# Patient Record
Sex: Female | Born: 1945
Health system: Southern US, Community
[De-identification: ages and names within clinical notes are randomized; demographics above are authoritative.]

## PROBLEM LIST (undated history)

## (undated) DIAGNOSIS — E11610 Type 2 diabetes mellitus with diabetic neuropathic arthropathy: Secondary | ICD-10-CM

## (undated) DIAGNOSIS — E049 Nontoxic goiter, unspecified: Secondary | ICD-10-CM

## (undated) DIAGNOSIS — F32A Depression, unspecified: Secondary | ICD-10-CM

## (undated) DIAGNOSIS — E119 Type 2 diabetes mellitus without complications: Secondary | ICD-10-CM

## (undated) DIAGNOSIS — M109 Gout, unspecified: Secondary | ICD-10-CM

## (undated) DIAGNOSIS — G629 Polyneuropathy, unspecified: Secondary | ICD-10-CM

## (undated) DIAGNOSIS — G43909 Migraine, unspecified, not intractable, without status migrainosus: Secondary | ICD-10-CM

## (undated) DIAGNOSIS — E785 Hyperlipidemia, unspecified: Secondary | ICD-10-CM

## (undated) DIAGNOSIS — M1990 Unspecified osteoarthritis, unspecified site: Secondary | ICD-10-CM

## (undated) DIAGNOSIS — R200 Anesthesia of skin: Secondary | ICD-10-CM

## (undated) DIAGNOSIS — I10 Essential (primary) hypertension: Secondary | ICD-10-CM

## (undated) DIAGNOSIS — F419 Anxiety disorder, unspecified: Secondary | ICD-10-CM

## (undated) DIAGNOSIS — K219 Gastro-esophageal reflux disease without esophagitis: Secondary | ICD-10-CM

## (undated) HISTORY — DX: Hyperlipidemia, unspecified: E78.5

## (undated) HISTORY — DX: Polyneuropathy, unspecified: G62.9

## (undated) HISTORY — DX: Anesthesia of skin: R20.0

---
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
## 1984-07-07 DIAGNOSIS — E049 Nontoxic goiter, unspecified: Secondary | ICD-10-CM

## 1984-07-07 HISTORY — PX: PARTIAL THYMECTOMY: SHX2177

## 1984-07-07 HISTORY — DX: Nontoxic goiter, unspecified: E04.9

## 1997-07-07 HISTORY — PX: ABDOMINAL HYSTERECTOMY: SHX81

## 2003-06-08 ENCOUNTER — Ambulatory Visit (HOSPITAL_COMMUNITY): Admission: RE | Admit: 2003-06-08 | Discharge: 2003-06-08 | Payer: Self-pay | Admitting: Internal Medicine

## 2003-09-08 ENCOUNTER — Ambulatory Visit (HOSPITAL_COMMUNITY): Admission: RE | Admit: 2003-09-08 | Discharge: 2003-09-08 | Payer: Self-pay | Admitting: Gastroenterology

## 2004-07-25 ENCOUNTER — Ambulatory Visit (HOSPITAL_COMMUNITY): Admission: RE | Admit: 2004-07-25 | Discharge: 2004-07-25 | Payer: Self-pay | Admitting: Cardiology

## 2004-08-07 ENCOUNTER — Ambulatory Visit (HOSPITAL_COMMUNITY): Admission: RE | Admit: 2004-08-07 | Discharge: 2004-08-07 | Payer: Self-pay | Admitting: Cardiology

## 2004-08-07 HISTORY — PX: CARDIAC CATHETERIZATION: SHX172

## 2007-09-09 ENCOUNTER — Encounter: Admission: RE | Admit: 2007-09-09 | Discharge: 2007-09-09 | Payer: Self-pay | Admitting: Obstetrics and Gynecology

## 2007-09-21 ENCOUNTER — Encounter: Admission: RE | Admit: 2007-09-21 | Discharge: 2007-09-21 | Payer: Self-pay | Admitting: Obstetrics and Gynecology

## 2007-12-08 ENCOUNTER — Encounter: Admission: RE | Admit: 2007-12-08 | Discharge: 2007-12-08 | Payer: Self-pay | Admitting: Internal Medicine

## 2010-04-10 ENCOUNTER — Encounter: Admission: RE | Admit: 2010-04-10 | Discharge: 2010-04-10 | Payer: Self-pay | Admitting: Orthopedic Surgery

## 2010-07-27 ENCOUNTER — Encounter: Payer: Self-pay | Admitting: Cardiology

## 2010-07-28 ENCOUNTER — Encounter: Payer: Self-pay | Admitting: Internal Medicine

## 2010-11-22 NOTE — Cardiovascular Report (Signed)
[name], Pamela Snyder                  ACCOUNT NO.:  [id number]   MEDICAL RECORD NO.:  [id number]          PATIENT TYPE:  OIB   LOCATION:  2876                         FACILITY:  [facility]   PHYSICIAN:  Antionette Char, MD    DATE OF BIRTH:  27-Apr-1946   DATE OF PROCEDURE:  08/07/2004  DATE OF DISCHARGE:                              CARDIAC CATHETERIZATION   REFERRING PHYSICIAN:  Juline Patch, M.D.   PROCEDURES:  1.  Left heart catheterization.  2.  Coronary scintiangiography.  3.  Left ventricular scintiangiography.  4.  Abdominal aortogram.  5.  Angioseal of the right femoral artery.   INDICATION FOR PROCEDURES:  This 65 year old female has a long history of  hypertension which has been difficult to control.  She recently had the  onset of anterior chest pain and dyspnea on exertion and had a stress test  done at [facility] with a Persantine-Cardiolite which was positive  for anterior and mid to proximal anteroseptal ischemia.  There was mild  septal hypokinesis.  She was then scheduled for cardiac catheterization  because of the evidence for the presence of myocardial ischemia.   PROCEDURE:  After signing informed consent, the patient was premedicated  with 5 mg of Valium by mouth and brought to the cardiac catheterization at  [facility].  Her right groin was prepped and draped in a sterile  fashion and anesthetized locally with 1% lidocaine.  A 6 French introducer  sheath was inserted percutaneously into the right femoral artery.  6 [place]  #4 Judkins coronary catheters were used to make injections into the native  coronary arteries.  A 6 French pigtail catheter was used to measure  pressures in the left ventricle and aorta and to make midstream injections  into the left ventricle and abdominal aorta.  The patient tolerated the  procedure well and no complications were noted.  At the end of the  procedure, the catheter and sheath were removed from the right  femoral  artery and hemostasis was easily obtained with an Angioseal closure system.   MEDICATIONS GIVEN:  None.   SCINTIANGIOGRAPHY FINDINGS:  1.  Coronary scintiangiography, left coronary artery.  The ostium and left      main appear normal.  Left anterior descending appears normal.      Circumflex coronary artery appears normal.  Right coronary artery      appears normal.  2.  Left ventricular scintiangiogram:  The left ventricular chamber size and      contractility appear normal with a normal left ventricular ejection      fraction estimated at approximately 60%.  The mitral and aortic valves      appear normal.  The left ventricular wall thickness appears normal.  3.  Abdominal aortogram:  The abdominal aorta and renal arteries appear      normal.   FINAL DIAGNOSES:  1.  Normal coronary arteries.  2.  Normal left ventricular function.  3.  Normal abdominal aorta and renal arteries.  4.  Successful Angioseal of the right femoral artery.   DISPOSITION:  Will monitor in the short stay unit prior to discharge when  stable.  She is to have followup with Dr. Ricki Miller for continued medical care.      JRT/MEDQ  D:  08/07/2004  T:  08/07/2004  Job:  [id number]   cc:   Juline Patch, M.D.  [address]  [place], [place] [address]  Fax: [phone]

## 2010-11-22 NOTE — Op Note (Signed)
NAME:  Pamela Snyder, Pamela Snyder                            ACCOUNT NO.:  [id number]   MEDICAL RECORD NO.:  [id number]                   PATIENT TYPE:  AMB   LOCATION:  ENDO                                 FACILITY:  [facility]   PHYSICIAN:  Anselmo Rod, M.D.               DATE OF BIRTH:  08-03-1945   DATE OF PROCEDURE:  09/08/2003  DATE OF DISCHARGE:                                 OPERATIVE REPORT   PROCEDURE:  Screening colonoscopy, endoscopy.   ENDOSCOPIST:  Anselmo Rod, M.D.   INSTRUMENT:  Olympus video colonoscope.   INDICATIONS FOR PROCEDURE:  A 65 year old white female with a family history  of colon cancer  undergoing screening colonoscopy to rule out colonic  polyps, masses, hemorrhoids, etc.   PRE-PROCEDURE PREPARATION:  Informed consent was procured from the patient.  Patient fasted for 8 hours prior to the procedure and prepped with a bottle  of magnesium citrate and a gallon of GoLYTELY the night prior to the  procedure.  Pre-procedure physical:  Patient had stable vital signs, neck  supple, chest clear to auscultation, S1 regular, respirations regular,  abdomen soft with normal bowel sounds.   DESCRIPTION OF PROCEDURE:  The patient was placed in the left lateral  decubitus position, sedated with 100 mg of Demerol and 10 mg of Versed  intravenously.  Once the patient was adequately sedated and maintained on  low flow oxygen, continuous cardiac monitoring; the Olympus video  colonoscope was advanced from the rectum to the cecum with difficulty.  There was some residual stool in the colon.  Multiple washings were done.  The appendiceal orifice and the ileocecal valve were clearly visualized and  photographed.  The patient had a lipomatous IC valve but TI appeared normal.  No masses, polyps, erosions, diverticular or ulcerations were seen.  Small  internal hemorrhoids were seen on retroflexion in the rectum.  The patient  tolerated the procedure well without complications.   IMPRESSION:  1. Normal colonoscopy to the terminal ileum except for small internal     hemorrhoids.  No masses or polyps seen.  2. Lipomatous ileocecal valve.  3. Some residual stool in the colon.   RECOMMENDATIONS:  1. Continue a high fiber diet with liberal fluids intake.  2. Repeat colorectal cancer screening is recommended in the next 5 years     unless the patient develops abnormal symptoms in the interim.  3. Outpatient follow up as the need arises in the future.                                               Anselmo Rod, M.D.    JNM/MEDQ  D:  09/08/2003  T:  09/09/2003  Job:  [id number]   cc:   Juline Patch, M.D.  [address]  [place], [place] [address]  Fax: [phone]

## 2014-07-11 DIAGNOSIS — Z23 Encounter for immunization: Secondary | ICD-10-CM | POA: Diagnosis not present

## 2014-07-11 DIAGNOSIS — I10 Essential (primary) hypertension: Secondary | ICD-10-CM | POA: Diagnosis not present

## 2014-07-11 DIAGNOSIS — K219 Gastro-esophageal reflux disease without esophagitis: Secondary | ICD-10-CM | POA: Diagnosis not present

## 2014-07-11 DIAGNOSIS — M1A0790 Idiopathic chronic gout, unspecified ankle and foot, without tophus (tophi): Secondary | ICD-10-CM | POA: Diagnosis not present

## 2014-07-11 DIAGNOSIS — E1165 Type 2 diabetes mellitus with hyperglycemia: Secondary | ICD-10-CM | POA: Diagnosis not present

## 2014-08-15 DIAGNOSIS — M5136 Other intervertebral disc degeneration, lumbar region: Secondary | ICD-10-CM | POA: Diagnosis not present

## 2014-08-15 DIAGNOSIS — M2241 Chondromalacia patellae, right knee: Secondary | ICD-10-CM | POA: Diagnosis not present

## 2014-08-15 DIAGNOSIS — M14671 Charcot's joint, right ankle and foot: Secondary | ICD-10-CM | POA: Diagnosis not present

## 2014-08-15 DIAGNOSIS — M7062 Trochanteric bursitis, left hip: Secondary | ICD-10-CM | POA: Diagnosis not present

## 2014-08-16 DIAGNOSIS — M14671 Charcot's joint, right ankle and foot: Secondary | ICD-10-CM | POA: Diagnosis not present

## 2014-08-16 DIAGNOSIS — E11610 Type 2 diabetes mellitus with diabetic neuropathic arthropathy: Secondary | ICD-10-CM | POA: Diagnosis not present

## 2014-10-23 DIAGNOSIS — M14671 Charcot's joint, right ankle and foot: Secondary | ICD-10-CM | POA: Diagnosis not present

## 2014-10-23 DIAGNOSIS — M25552 Pain in left hip: Secondary | ICD-10-CM | POA: Diagnosis not present

## 2014-10-23 DIAGNOSIS — M6701 Short Achilles tendon (acquired), right ankle: Secondary | ICD-10-CM | POA: Diagnosis not present

## 2014-10-23 DIAGNOSIS — E1142 Type 2 diabetes mellitus with diabetic polyneuropathy: Secondary | ICD-10-CM | POA: Diagnosis not present

## 2014-10-26 DIAGNOSIS — E11610 Type 2 diabetes mellitus with diabetic neuropathic arthropathy: Secondary | ICD-10-CM | POA: Diagnosis not present

## 2014-10-26 DIAGNOSIS — M14671 Charcot's joint, right ankle and foot: Secondary | ICD-10-CM | POA: Diagnosis not present

## 2014-10-27 DIAGNOSIS — E780 Pure hypercholesterolemia: Secondary | ICD-10-CM | POA: Diagnosis not present

## 2014-10-27 DIAGNOSIS — E1165 Type 2 diabetes mellitus with hyperglycemia: Secondary | ICD-10-CM | POA: Diagnosis not present

## 2014-11-02 DIAGNOSIS — Z23 Encounter for immunization: Secondary | ICD-10-CM | POA: Diagnosis not present

## 2014-11-02 DIAGNOSIS — Z0000 Encounter for general adult medical examination without abnormal findings: Secondary | ICD-10-CM | POA: Diagnosis not present

## 2014-11-02 DIAGNOSIS — M1A0790 Idiopathic chronic gout, unspecified ankle and foot, without tophus (tophi): Secondary | ICD-10-CM | POA: Diagnosis not present

## 2014-11-02 DIAGNOSIS — Z1389 Encounter for screening for other disorder: Secondary | ICD-10-CM | POA: Diagnosis not present

## 2014-11-08 DIAGNOSIS — M14671 Charcot's joint, right ankle and foot: Secondary | ICD-10-CM | POA: Diagnosis not present

## 2014-11-14 ENCOUNTER — Other Ambulatory Visit: Payer: Self-pay | Admitting: Orthopedic Surgery

## 2014-11-15 ENCOUNTER — Encounter (HOSPITAL_COMMUNITY): Payer: Self-pay

## 2014-11-15 ENCOUNTER — Encounter (HOSPITAL_COMMUNITY)
Admission: RE | Admit: 2014-11-15 | Discharge: 2014-11-15 | Disposition: A | Discharge status: Home / Self Care | Payer: Medicare Other | Source: Ambulatory Visit | Attending: Orthopedic Surgery | Admitting: Orthopedic Surgery

## 2014-11-15 DIAGNOSIS — E119 Type 2 diabetes mellitus without complications: Secondary | ICD-10-CM | POA: Insufficient documentation

## 2014-11-15 DIAGNOSIS — Z01810 Encounter for preprocedural cardiovascular examination: Secondary | ICD-10-CM | POA: Diagnosis not present

## 2014-11-15 DIAGNOSIS — I10 Essential (primary) hypertension: Secondary | ICD-10-CM | POA: Diagnosis not present

## 2014-11-15 DIAGNOSIS — Z01812 Encounter for preprocedural laboratory examination: Secondary | ICD-10-CM | POA: Diagnosis not present

## 2014-11-15 HISTORY — DX: Essential (primary) hypertension: I10

## 2014-11-15 HISTORY — DX: Gastro-esophageal reflux disease without esophagitis: K21.9

## 2014-11-15 HISTORY — DX: Unspecified osteoarthritis, unspecified site: M19.90

## 2014-11-15 LAB — BASIC METABOLIC PANEL
Anion gap: 11 (ref 5–15)
BUN: 23 mg/dL — ABNORMAL HIGH (ref 6–20)
CO2: 24 mmol/L (ref 22–32)
Calcium: 9.4 mg/dL (ref 8.9–10.3)
Chloride: 99 mmol/L — ABNORMAL LOW (ref 101–111)
Creatinine, Ser: 1.66 mg/dL — ABNORMAL HIGH (ref 0.44–1.00)
GFR calc Af Amer: 36 mL/min — ABNORMAL LOW (ref >60)
GFR calc non Af Amer: 31 mL/min — ABNORMAL LOW (ref >60)
Glucose, Bld: 118 mg/dL — ABNORMAL HIGH (ref 70–99)
Potassium: 3.9 mmol/L (ref 3.5–5.1)
Sodium: 134 mmol/L — ABNORMAL LOW (ref 135–145)

## 2014-11-15 LAB — CBC
HCT: 37.1 % (ref 36.0–46.0)
Hemoglobin: 12 g/dL (ref 12.0–15.0)
MCH: 29.4 pg (ref 26.0–34.0)
MCHC: 32.3 g/dL (ref 30.0–36.0)
MCV: 90.9 fL (ref 78.0–100.0)
Platelets: 345 10*3/uL (ref 150–400)
RBC: 4.08 MIL/uL (ref 3.87–5.11)
RDW: 13.6 % (ref 11.5–15.5)
WBC: 16 10*3/uL — ABNORMAL HIGH (ref 4.0–10.5)

## 2014-11-15 NOTE — Pre-Procedure Instructions (Addendum)
Pamela Snyder  11/15/2014   Your procedure is scheduled on:  11/23/14  Report to [facility] short stay admitting at 530 AM.  Call this number if you have problems the morning of surgery: [phone]   Remember:   Do not eat food or drink liquids after midnight.   Take these medicines the morning of surgery with A SIP OF WATER: allopurinol. Metropolol. Protonix. Lyrica, pain med as needed    STOP all herbel meds, nsaids (aleve,naproxen,advil,ibuprofen) 5 days prior to surgery starting 11/18/14 including vit B, multi vit, biotin, cal-magnesium, folic acid, ginkgo,vit D     NO diabetic med am of surgery   Do not wear jewelry, make-up or nail polish.  Do not wear lotions, powders, or perfumes. You may wear deodorant.  Do not shave 48 hours prior to surgery. Men may shave face and neck.  Do not bring valuables to the hospital.  [facility] is not responsible                  for any belongings or valuables.               Contacts, dentures or bridgework may not be worn into surgery.  Leave suitcase in the car. After surgery it may be brought to your room.  For patients admitted to the hospital, discharge time is determined by your                treatment team.               Patients discharged the day of surgery will not be allowed to drive  home.  Name and phone number of your driver:   Special Instructions:  Special Instructions: [name] - Preparing for Surgery  Before surgery, you can play an important role.  Because skin is not sterile, your skin needs to be as free of germs as possible.  You can reduce the number of germs on you skin by washing with CHG (chlorahexidine gluconate) soap before surgery.  CHG is an antiseptic cleaner which kills germs and bonds with the skin to continue killing germs even after washing.  Please DO NOT use if you have an allergy to CHG or antibacterial soaps.  If your skin becomes reddened/irritated stop using the CHG and inform your nurse when you  arrive at Short Stay.  Do not shave (including legs and underarms) for at least 48 hours prior to the first CHG shower.  You may shave your face.  Please follow these instructions carefully:   1.  Shower with CHG Soap the night before surgery and the morning of Surgery.  2.  If you choose to wash your hair, wash your hair first as usual with your normal shampoo.  3.  After you shampoo, rinse your hair and body thoroughly to remove the Shampoo.  4.  Use CHG as you would any other liquid soap.  You can apply chg directly  to the skin and wash gently with scrungie or a clean washcloth.  5.  Apply the CHG Soap to your body ONLY FROM THE NECK DOWN.  Do not use on open wounds or open sores.  Avoid contact with your eyes ears, mouth and genitals (private parts).  Wash genitals (private parts)       with your normal soap.  6.  Wash thoroughly, paying special attention to the area where your surgery will be performed.  7.  Thoroughly rinse your body with warm water from  the neck down.  8.  DO NOT shower/wash with your normal soap after using and rinsing off the CHG Soap.  9.  Pat yourself dry with a clean towel.            10.  Wear clean pajamas.            11.  Place clean sheets on your bed the night of your first shower and do not sleep with pets.  Day of Surgery  Do not apply any lotions/deodorants the morning of surgery.  Please wear clean clothes to the hospital/surgery center.   Please read over the following fact sheets that you were given: Pain Booklet, Coughing and Deep Breathing and Surgical Site Infection Prevention

## 2014-11-15 NOTE — Progress Notes (Signed)
   11/15/14 1118  OBSTRUCTIVE SLEEP APNEA  Have you ever been diagnosed with sleep apnea through a sleep study? No  Do you snore loudly (loud enough to be heard through closed doors)?  1  Do you often feel tired, fatigued, or sleepy during the daytime? 0  Has anyone observed you stop breathing during your sleep? 0  Do you have, or are you being treated for high blood pressure? 1  BMI more than 35 kg/m2? 1  Age over 68 years old? 1  Neck circumference greater than 40 cm/16 inches? 0 (15.5)  Gender: 0  Obstructive Sleep Apnea Score 4

## 2014-11-16 NOTE — Progress Notes (Signed)
Anesthesia Chart Review:  Pt is 69 year old female scheduled for R mid foot osteotomy on 11/23/2014 with Dr. Doran Durand.   PMH includes: HTN, DM, GERD. Former smoker. BMI 38.5.   Medications include: lasix, metformin, metoprolol, olmesartan-hctz, lyrica, protonix  Preoperative labs reviewed.  Cr 1.66. WBC 16.   EKG: NSR.   Pt reports hx cardiac cath in 2006 that was normal.   Called and spoke with pt about lab results. She reports renal function has been elevated lately and her PCP, Dr. Minna Antis, is monitoring it. Will request records from his office. She also reports her WBC was elevated at her most recent visit with Dr. Minna Antis. She denies illness, no congestion, UTI sx, nausea, vomiting or diarrhea, no skin changes/rash. She reports she does have a mild cough but reports this is typical for her at this time of year due to allergies and she does not feel ill.  She does report she has complicated dental work and sometimes gets tooth/gum infections; the last was 2 weeks ago but she denies any current sx of dental infection.   Received lab results from Dr. Wilmon Pali office. Last labs drawn 11/02/2014 with comparison labs available from 07/05/2014 and 11/21/2013. Pt's cr has been 1.1-1.3 over past year. WBC has been 12.1-12.7 over past year.  Notified Abigail Butts in Dr. Nona Dell office of elevated WBC count and faxed lab results for his review.   Discussed with Dr. Kalman Shan.   If no changes, I anticipate pt can proceed with surgery as scheduled.   Willeen Cass, FNP-BC [facility] Short Stay Surgical Center/Anesthesiology Phone: [phone] 11/16/2014 4:29 PM

## 2014-11-22 MED ORDER — CEFAZOLIN SODIUM-DEXTROSE 2-3 GM-% IV SOLR
2.0000 g | INTRAVENOUS | Status: AC
  Administered 2014-11-23: 2 g via INTRAVENOUS

## 2014-11-23 ENCOUNTER — Inpatient Hospital Stay (HOSPITAL_COMMUNITY): Payer: Medicare Other | Admitting: Emergency Medicine

## 2014-11-23 ENCOUNTER — Encounter (HOSPITAL_COMMUNITY): Payer: Self-pay

## 2014-11-23 ENCOUNTER — Encounter (HOSPITAL_COMMUNITY)
Admission: RE | Disposition: A | Discharge status: Skilled Nursing Facility | Payer: Self-pay | Source: Ambulatory Visit | Attending: Orthopedic Surgery

## 2014-11-23 ENCOUNTER — Inpatient Hospital Stay (HOSPITAL_COMMUNITY): Payer: Medicare Other | Admitting: Anesthesiology

## 2014-11-23 ENCOUNTER — Inpatient Hospital Stay (HOSPITAL_COMMUNITY)
Admission: RE | Admit: 2014-11-23 | Discharge: 2014-11-27 | DRG: 983 | Disposition: A | Discharge status: Skilled Nursing Facility | Payer: Medicare Other | Source: Ambulatory Visit | Attending: Orthopedic Surgery | Admitting: Orthopedic Surgery

## 2014-11-23 DIAGNOSIS — E1142 Type 2 diabetes mellitus with diabetic polyneuropathy: Secondary | ICD-10-CM | POA: Diagnosis not present

## 2014-11-23 DIAGNOSIS — M6281 Muscle weakness (generalized): Secondary | ICD-10-CM | POA: Diagnosis not present

## 2014-11-23 DIAGNOSIS — Z90710 Acquired absence of both cervix and uterus: Secondary | ICD-10-CM | POA: Diagnosis not present

## 2014-11-23 DIAGNOSIS — R278 Other lack of coordination: Secondary | ICD-10-CM | POA: Diagnosis not present

## 2014-11-23 DIAGNOSIS — I10 Essential (primary) hypertension: Secondary | ICD-10-CM | POA: Diagnosis present

## 2014-11-23 DIAGNOSIS — E11610 Type 2 diabetes mellitus with diabetic neuropathic arthropathy: Principal | ICD-10-CM | POA: Diagnosis present

## 2014-11-23 DIAGNOSIS — K219 Gastro-esophageal reflux disease without esophagitis: Secondary | ICD-10-CM | POA: Diagnosis present

## 2014-11-23 DIAGNOSIS — G629 Polyneuropathy, unspecified: Secondary | ICD-10-CM | POA: Diagnosis not present

## 2014-11-23 DIAGNOSIS — Z981 Arthrodesis status: Secondary | ICD-10-CM | POA: Diagnosis not present

## 2014-11-23 DIAGNOSIS — M14671 Charcot's joint, right ankle and foot: Secondary | ICD-10-CM | POA: Diagnosis not present

## 2014-11-23 DIAGNOSIS — Z87891 Personal history of nicotine dependence: Secondary | ICD-10-CM

## 2014-11-23 DIAGNOSIS — M6701 Short Achilles tendon (acquired), right ankle: Secondary | ICD-10-CM | POA: Diagnosis not present

## 2014-11-23 DIAGNOSIS — M21961 Unspecified acquired deformity of right lower leg: Secondary | ICD-10-CM | POA: Diagnosis present

## 2014-11-23 DIAGNOSIS — M1990 Unspecified osteoarthritis, unspecified site: Secondary | ICD-10-CM | POA: Diagnosis present

## 2014-11-23 DIAGNOSIS — G4700 Insomnia, unspecified: Secondary | ICD-10-CM | POA: Diagnosis not present

## 2014-11-23 DIAGNOSIS — E118 Type 2 diabetes mellitus with unspecified complications: Secondary | ICD-10-CM | POA: Diagnosis not present

## 2014-11-23 DIAGNOSIS — G8918 Other acute postprocedural pain: Secondary | ICD-10-CM | POA: Diagnosis not present

## 2014-11-23 DIAGNOSIS — M6700 Short Achilles tendon (acquired), unspecified ankle: Secondary | ICD-10-CM | POA: Diagnosis not present

## 2014-11-23 DIAGNOSIS — R262 Difficulty in walking, not elsewhere classified: Secondary | ICD-10-CM | POA: Diagnosis not present

## 2014-11-23 HISTORY — DX: Nontoxic goiter, unspecified: E04.9

## 2014-11-23 HISTORY — DX: Gout, unspecified: M10.9

## 2014-11-23 HISTORY — PX: ACHILLES TENDON LENGTHENING: SHX6455

## 2014-11-23 HISTORY — DX: Type 2 diabetes mellitus with diabetic neuropathic arthropathy: E11.610

## 2014-11-23 HISTORY — PX: METATARSAL OSTEOTOMY: SHX1641

## 2014-11-23 HISTORY — PX: FOOT ARTHRODESIS: SHX1655

## 2014-11-23 HISTORY — PX: ARTHRODESIS: SHX136

## 2014-11-23 HISTORY — DX: Migraine, unspecified, not intractable, without status migrainosus: G43.909

## 2014-11-23 HISTORY — PX: ACHILLES TENDON LENGTHENING: SUR826

## 2014-11-23 HISTORY — PX: OSTEOTOMY: SHX137

## 2014-11-23 HISTORY — DX: Type 2 diabetes mellitus without complications: E11.9

## 2014-11-23 LAB — CBC
HCT: 35.7 % — ABNORMAL LOW (ref 36.0–46.0)
Hemoglobin: 11.9 g/dL — ABNORMAL LOW (ref 12.0–15.0)
MCH: 30.2 pg (ref 26.0–34.0)
MCHC: 33.3 g/dL (ref 30.0–36.0)
MCV: 90.6 fL (ref 78.0–100.0)
Platelets: 275 10*3/uL (ref 150–400)
RBC: 3.94 MIL/uL (ref 3.87–5.11)
RDW: 13.4 % (ref 11.5–15.5)
WBC: 16.1 10*3/uL — ABNORMAL HIGH (ref 4.0–10.5)

## 2014-11-23 LAB — GLUCOSE, CAPILLARY
Glucose-Capillary: 128 mg/dL — ABNORMAL HIGH (ref 65–99)
Glucose-Capillary: 118 mg/dL — ABNORMAL HIGH (ref 65–99)
Glucose-Capillary: 114 mg/dL — ABNORMAL HIGH (ref 65–99)
Glucose-Capillary: 113 mg/dL — ABNORMAL HIGH (ref 65–99)

## 2014-11-23 LAB — CREATININE, SERUM
Creatinine, Ser: 1.27 mg/dL — ABNORMAL HIGH (ref 0.44–1.00)
GFR calc Af Amer: 49 mL/min — ABNORMAL LOW (ref >60)
GFR calc non Af Amer: 43 mL/min — ABNORMAL LOW (ref >60)

## 2014-11-23 SURGERY — OSTEOTOMY, METATARSAL BONE
Anesthesia: General | Laterality: Right

## 2014-11-23 MED ORDER — DOCUSATE SODIUM 100 MG PO CAPS
100.0000 mg | ORAL_CAPSULE | Freq: Two times a day (BID) | ORAL | Status: DC
  Administered 2014-11-23 – 2014-11-27 (×8): 100 mg via ORAL
  Filled 2014-11-23 (×9): qty 1

## 2014-11-23 MED ORDER — FENTANYL CITRATE (PF) 250 MCG/5ML IJ SOLN
INTRAMUSCULAR | Status: AC
  Filled 2014-11-23: qty 5

## 2014-11-23 MED ORDER — OXYCODONE HCL 5 MG PO TABS
5.0000 mg | ORAL_TABLET | ORAL | Status: DC | PRN
  Administered 2014-11-24 – 2014-11-27 (×6): 10 mg via ORAL
  Filled 2014-11-23 (×6): qty 2

## 2014-11-23 MED ORDER — ACETAMINOPHEN 650 MG RE SUPP: 650.0000 mg | Freq: Four times a day (QID) | RECTAL | Status: DC | PRN

## 2014-11-23 MED ORDER — EPHEDRINE SULFATE 50 MG/ML IJ SOLN
INTRAMUSCULAR | Status: DC | PRN
  Administered 2014-11-23: 20 mg via INTRAVENOUS
  Administered 2014-11-23 (×3): 10 mg via INTRAVENOUS

## 2014-11-23 MED ORDER — ASPIRIN EC 325 MG PO TBEC
325.0000 mg | DELAYED_RELEASE_TABLET | Freq: Every day | ORAL | Status: DC
  Administered 2014-11-24 – 2014-11-27 (×4): 325 mg via ORAL
  Filled 2014-11-23 (×4): qty 1

## 2014-11-23 MED ORDER — LIDOCAINE HCL (CARDIAC) 20 MG/ML IV SOLN
INTRAVENOUS | Status: AC
  Filled 2014-11-23: qty 5

## 2014-11-23 MED ORDER — DEXTROSE 5 % IV SOLN
10.0000 mg | INTRAVENOUS | Status: DC | PRN
  Administered 2014-11-23: 25 ug/min via INTRAVENOUS

## 2014-11-23 MED ORDER — METFORMIN HCL ER 500 MG PO TB24: 500.0000 mg | ORAL_TABLET | Freq: Every day | ORAL | Status: DC

## 2014-11-23 MED ORDER — MORPHINE SULFATE 2 MG/ML IJ SOLN
2.0000 mg | INTRAMUSCULAR | Status: DC | PRN
  Administered 2014-11-23: 2 mg via INTRAVENOUS
  Filled 2014-11-23: qty 1

## 2014-11-23 MED ORDER — ENOXAPARIN SODIUM 40 MG/0.4ML ~~LOC~~ SOLN
40.0000 mg | SUBCUTANEOUS | Status: DC
  Administered 2014-11-24 – 2014-11-27 (×4): 40 mg via SUBCUTANEOUS
  Filled 2014-11-23 (×4): qty 0.4

## 2014-11-23 MED ORDER — SODIUM CHLORIDE 0.9 % IJ SOLN
INTRAMUSCULAR | Status: AC
  Filled 2014-11-23: qty 10

## 2014-11-23 MED ORDER — NEOSTIGMINE METHYLSULFATE 10 MG/10ML IV SOLN
INTRAVENOUS | Status: AC
  Filled 2014-11-23: qty 1

## 2014-11-23 MED ORDER — SODIUM CHLORIDE 0.9 % IV SOLN
INTRAVENOUS | Status: DC
  Administered 2014-11-23: 15:00:00 via INTRAVENOUS

## 2014-11-23 MED ORDER — ONDANSETRON HCL 4 MG PO TABS: 4.0000 mg | ORAL_TABLET | Freq: Four times a day (QID) | ORAL | Status: DC | PRN

## 2014-11-23 MED ORDER — EPHEDRINE SULFATE 50 MG/ML IJ SOLN
INTRAMUSCULAR | Status: AC
  Filled 2014-11-23: qty 1

## 2014-11-23 MED ORDER — SUCCINYLCHOLINE CHLORIDE 20 MG/ML IJ SOLN
INTRAMUSCULAR | Status: AC
  Filled 2014-11-23: qty 1

## 2014-11-23 MED ORDER — PANTOPRAZOLE SODIUM 40 MG PO TBEC
40.0000 mg | DELAYED_RELEASE_TABLET | Freq: Every day | ORAL | Status: DC
  Administered 2014-11-24 – 2014-11-27 (×4): 40 mg via ORAL
  Filled 2014-11-23 (×4): qty 1

## 2014-11-23 MED ORDER — PROPOFOL 10 MG/ML IV BOLUS
INTRAVENOUS | Status: AC
  Filled 2014-11-23: qty 20

## 2014-11-23 MED ORDER — MAGNESIUM CITRATE PO SOLN
1.0000 | Freq: Once | ORAL | Status: AC | PRN
  Filled 2014-11-23: qty 296

## 2014-11-23 MED ORDER — 0.9 % SODIUM CHLORIDE (POUR BTL) OPTIME
TOPICAL | Status: DC | PRN
  Administered 2014-11-23: 1000 mL

## 2014-11-23 MED ORDER — METOPROLOL SUCCINATE ER 100 MG PO TB24
100.0000 mg | ORAL_TABLET | Freq: Every day | ORAL | Status: DC
  Administered 2014-11-24 – 2014-11-27 (×4): 100 mg via ORAL
  Filled 2014-11-23 (×4): qty 1

## 2014-11-23 MED ORDER — SENNA 8.6 MG PO TABS
1.0000 | ORAL_TABLET | Freq: Two times a day (BID) | ORAL | Status: DC
  Administered 2014-11-23 – 2014-11-27 (×8): 8.6 mg via ORAL
  Filled 2014-11-23 (×8): qty 1

## 2014-11-23 MED ORDER — OXYCODONE HCL 5 MG PO TABS: 5.0000 mg | ORAL_TABLET | Freq: Once | ORAL | Status: DC | PRN

## 2014-11-23 MED ORDER — CHLORHEXIDINE GLUCONATE 4 % EX LIQD: 60.0000 mL | Freq: Once | CUTANEOUS | Status: DC

## 2014-11-23 MED ORDER — IRBESARTAN 150 MG PO TABS
150.0000 mg | ORAL_TABLET | Freq: Every day | ORAL | Status: DC
  Administered 2014-11-24 – 2014-11-27 (×3): 150 mg via ORAL
  Filled 2014-11-23 (×4): qty 1

## 2014-11-23 MED ORDER — METOCLOPRAMIDE HCL 5 MG/ML IJ SOLN
5.0000 mg | Freq: Three times a day (TID) | INTRAMUSCULAR | Status: DC | PRN
  Filled 2014-11-23: qty 2

## 2014-11-23 MED ORDER — LIDOCAINE HCL (CARDIAC) 20 MG/ML IV SOLN
INTRAVENOUS | Status: DC | PRN
  Administered 2014-11-23: 40 mg via INTRAVENOUS

## 2014-11-23 MED ORDER — METHOCARBAMOL 500 MG PO TABS
500.0000 mg | ORAL_TABLET | Freq: Four times a day (QID) | ORAL | Status: DC | PRN
  Administered 2014-11-23 – 2014-11-24 (×4): 500 mg via ORAL
  Filled 2014-11-23 (×5): qty 1

## 2014-11-23 MED ORDER — GLYCOPYRROLATE 0.2 MG/ML IJ SOLN
INTRAMUSCULAR | Status: AC
  Filled 2014-11-23: qty 2

## 2014-11-23 MED ORDER — SODIUM CHLORIDE 0.9 % IV SOLN: INTRAVENOUS | Status: DC

## 2014-11-23 MED ORDER — FUROSEMIDE 20 MG PO TABS: 20.0000 mg | ORAL_TABLET | Freq: Every day | ORAL | Status: DC | PRN

## 2014-11-23 MED ORDER — HYDROCHLOROTHIAZIDE 12.5 MG PO CAPS
12.5000 mg | ORAL_CAPSULE | Freq: Every day | ORAL | Status: DC
  Administered 2014-11-24 – 2014-11-27 (×4): 12.5 mg via ORAL
  Filled 2014-11-23 (×4): qty 1

## 2014-11-23 MED ORDER — ARTIFICIAL TEARS OP OINT
TOPICAL_OINTMENT | OPHTHALMIC | Status: AC
  Filled 2014-11-23: qty 3.5

## 2014-11-23 MED ORDER — ACETAMINOPHEN 160 MG/5ML PO SOLN
325.0000 mg | ORAL | Status: DC | PRN
  Filled 2014-11-23: qty 20.3

## 2014-11-23 MED ORDER — MIDAZOLAM HCL 5 MG/5ML IJ SOLN
INTRAMUSCULAR | Status: DC | PRN
  Administered 2014-11-23: 2 mg via INTRAVENOUS

## 2014-11-23 MED ORDER — ONDANSETRON HCL 4 MG/2ML IJ SOLN
INTRAMUSCULAR | Status: DC | PRN
  Administered 2014-11-23: 4 mg via INTRAVENOUS

## 2014-11-23 MED ORDER — OXYCODONE HCL 5 MG/5ML PO SOLN: 5.0000 mg | Freq: Once | ORAL | Status: DC | PRN

## 2014-11-23 MED ORDER — BUPIVACAINE-EPINEPHRINE (PF) 0.5% -1:200000 IJ SOLN
INTRAMUSCULAR | Status: DC | PRN
  Administered 2014-11-23: 30 mL via PERINEURAL

## 2014-11-23 MED ORDER — METHOCARBAMOL 1000 MG/10ML IJ SOLN
500.0000 mg | Freq: Four times a day (QID) | INTRAVENOUS | Status: DC | PRN
  Filled 2014-11-23: qty 5

## 2014-11-23 MED ORDER — OLMESARTAN MEDOXOMIL-HCTZ 40-25 MG PO TABS: 0.5000 | ORAL_TABLET | Freq: Every day | ORAL | Status: DC

## 2014-11-23 MED ORDER — ROCURONIUM BROMIDE 50 MG/5ML IV SOLN
INTRAVENOUS | Status: AC
  Filled 2014-11-23: qty 1

## 2014-11-23 MED ORDER — LACTATED RINGERS IV SOLN
INTRAVENOUS | Status: DC | PRN
  Administered 2014-11-23 (×2): via INTRAVENOUS

## 2014-11-23 MED ORDER — ONDANSETRON HCL 4 MG/2ML IJ SOLN
INTRAMUSCULAR | Status: AC
  Filled 2014-11-23: qty 2

## 2014-11-23 MED ORDER — MIDAZOLAM HCL 2 MG/2ML IJ SOLN
INTRAMUSCULAR | Status: AC
  Filled 2014-11-23: qty 2

## 2014-11-23 MED ORDER — ACETAMINOPHEN 325 MG PO TABS
650.0000 mg | ORAL_TABLET | Freq: Four times a day (QID) | ORAL | Status: DC | PRN
  Administered 2014-11-24 (×2): 650 mg via ORAL
  Filled 2014-11-23 (×3): qty 2

## 2014-11-23 MED ORDER — FENTANYL CITRATE (PF) 100 MCG/2ML IJ SOLN
INTRAMUSCULAR | Status: DC | PRN
  Administered 2014-11-23 (×3): 50 ug via INTRAVENOUS
  Administered 2014-11-23: 100 ug via INTRAVENOUS

## 2014-11-23 MED ORDER — METOCLOPRAMIDE HCL 5 MG PO TABS
5.0000 mg | ORAL_TABLET | Freq: Three times a day (TID) | ORAL | Status: DC | PRN
  Filled 2014-11-23: qty 2

## 2014-11-23 MED ORDER — PHENYLEPHRINE 40 MCG/ML (10ML) SYRINGE FOR IV PUSH (FOR BLOOD PRESSURE SUPPORT)
PREFILLED_SYRINGE | INTRAVENOUS | Status: AC
  Filled 2014-11-23: qty 10

## 2014-11-23 MED ORDER — INSULIN ASPART 100 UNIT/ML ~~LOC~~ SOLN
0.0000 [IU] | Freq: Three times a day (TID) | SUBCUTANEOUS | Status: DC
  Administered 2014-11-24 – 2014-11-25 (×3): 2 [IU] via SUBCUTANEOUS
  Administered 2014-11-25 (×2): 3 [IU] via SUBCUTANEOUS
  Administered 2014-11-26 (×2): 2 [IU] via SUBCUTANEOUS
  Administered 2014-11-26: 3 [IU] via SUBCUTANEOUS
  Filled 2014-11-23 (×26): qty 0.15

## 2014-11-23 MED ORDER — PROPOFOL 10 MG/ML IV BOLUS
INTRAVENOUS | Status: DC | PRN
  Administered 2014-11-23: 160 mg via INTRAVENOUS

## 2014-11-23 MED ORDER — ACETAMINOPHEN 325 MG PO TABS: 325.0000 mg | ORAL_TABLET | ORAL | Status: DC | PRN

## 2014-11-23 MED ORDER — CHOLECALCIFEROL 25 MCG (1000 UT) PO CAPS: 1000.0000 [IU] | ORAL_CAPSULE | Freq: Every day | ORAL | Status: DC

## 2014-11-23 MED ORDER — TRAZODONE HCL 150 MG PO TABS
150.0000 mg | ORAL_TABLET | Freq: Every evening | ORAL | Status: DC | PRN
  Administered 2014-11-24: 150 mg via ORAL
  Filled 2014-11-23: qty 1

## 2014-11-23 MED ORDER — POTASSIUM CHLORIDE CRYS ER 20 MEQ PO TBCR: 20.0000 meq | EXTENDED_RELEASE_TABLET | Freq: Every day | ORAL | Status: DC

## 2014-11-23 MED ORDER — ONDANSETRON HCL 4 MG/2ML IJ SOLN: 4.0000 mg | Freq: Four times a day (QID) | INTRAMUSCULAR | Status: DC | PRN

## 2014-11-23 MED ORDER — VANCOMYCIN HCL 500 MG IV SOLR
INTRAVENOUS | Status: AC
  Filled 2014-11-23: qty 500

## 2014-11-23 MED ORDER — VANCOMYCIN HCL 500 MG IV SOLR
INTRAVENOUS | Status: DC | PRN
  Administered 2014-11-23: 500 mg

## 2014-11-23 MED ORDER — FOLIC ACID 1 MG PO TABS
1.0000 mg | ORAL_TABLET | Freq: Every day | ORAL | Status: DC
  Administered 2014-11-24 – 2014-11-27 (×4): 1 mg via ORAL
  Filled 2014-11-23 (×4): qty 1

## 2014-11-23 MED ORDER — ALLOPURINOL 300 MG PO TABS
300.0000 mg | ORAL_TABLET | Freq: Every day | ORAL | Status: DC
  Administered 2014-11-24 – 2014-11-27 (×4): 300 mg via ORAL
  Filled 2014-11-23 (×4): qty 1

## 2014-11-23 MED ORDER — PREGABALIN 75 MG PO CAPS
150.0000 mg | ORAL_CAPSULE | Freq: Three times a day (TID) | ORAL | Status: DC
  Administered 2014-11-23 – 2014-11-27 (×11): 150 mg via ORAL
  Filled 2014-11-23 (×11): qty 2

## 2014-11-23 MED ORDER — FENTANYL CITRATE (PF) 100 MCG/2ML IJ SOLN: 25.0000 ug | INTRAMUSCULAR | Status: DC | PRN

## 2014-11-23 MED ORDER — PHENYLEPHRINE HCL 10 MG/ML IJ SOLN
INTRAMUSCULAR | Status: DC | PRN
  Administered 2014-11-23: 120 ug via INTRAVENOUS
  Administered 2014-11-23: 40 ug via INTRAVENOUS
  Administered 2014-11-23 (×2): 120 ug via INTRAVENOUS

## 2014-11-23 SURGICAL SUPPLY — 82 items
BANDAGE ESMARK 6X9 LF (GAUZE/BANDAGES/DRESSINGS) ×1 IMPLANT
BIT DRILL 2.5X2.75 QC CALB (BIT) ×3 IMPLANT
BIT DRILL 2.9 CANN QC NONSTRL (BIT) ×3 IMPLANT
BLADE SAW SGTL 13X75X1.27 (BLADE) ×3 IMPLANT
BLADE SURG 10 STRL SS (BLADE) ×6 IMPLANT
BNDG CMPR 9X6 STRL LF SNTH (GAUZE/BANDAGES/DRESSINGS) ×1
BNDG COHESIVE 4X5 TAN STRL (GAUZE/BANDAGES/DRESSINGS) IMPLANT
BNDG COHESIVE 6X5 TAN STRL LF (GAUZE/BANDAGES/DRESSINGS) ×6 IMPLANT
BNDG ESMARK 6X9 LF (GAUZE/BANDAGES/DRESSINGS) ×3
CANISTER SUCT 3000ML PPV (MISCELLANEOUS) ×3 IMPLANT
CHLORAPREP W/TINT 26ML (MISCELLANEOUS) ×3 IMPLANT
COVER SURGICAL LIGHT HANDLE (MISCELLANEOUS) ×3 IMPLANT
CUFF TOURNIQUET SINGLE 34IN LL (TOURNIQUET CUFF) ×3 IMPLANT
CUFF TOURNIQUET SINGLE 44IN (TOURNIQUET CUFF) IMPLANT
DRAPE C-ARM 42X72 X-RAY (DRAPES) ×3 IMPLANT
DRAPE U-SHAPE 47X51 STRL (DRAPES) ×3 IMPLANT
DRILL SLEEVE 2.7 DIST TIB (TRAUMA) ×2
DRSG ADAPTIC 3X8 NADH LF (GAUZE/BANDAGES/DRESSINGS) ×3 IMPLANT
DRSG MEPITEL 4X7.2 (GAUZE/BANDAGES/DRESSINGS) ×3 IMPLANT
DRSG PAD ABDOMINAL 8X10 ST (GAUZE/BANDAGES/DRESSINGS) ×6 IMPLANT
ELECT REM PT RETURN 9FT ADLT (ELECTROSURGICAL) ×3
ELECTRODE REM PT RTRN 9FT ADLT (ELECTROSURGICAL) ×1 IMPLANT
GAUZE SPONGE 4X4 12PLY STRL (GAUZE/BANDAGES/DRESSINGS) IMPLANT
GLOVE BIO SURGEON STRL SZ 6.5 (GLOVE) ×2 IMPLANT
GLOVE BIO SURGEON STRL SZ7 (GLOVE) ×6 IMPLANT
GLOVE BIO SURGEON STRL SZ8 (GLOVE) ×3 IMPLANT
GLOVE BIO SURGEONS STRL SZ 6.5 (GLOVE) ×1
GLOVE BIOGEL PI IND STRL 6.5 (GLOVE) ×1 IMPLANT
GLOVE BIOGEL PI IND STRL 7.5 (GLOVE) ×1 IMPLANT
GLOVE BIOGEL PI IND STRL 8 (GLOVE) ×1 IMPLANT
GLOVE BIOGEL PI INDICATOR 6.5 (GLOVE) ×2
GLOVE BIOGEL PI INDICATOR 7.5 (GLOVE) ×2
GLOVE BIOGEL PI INDICATOR 8 (GLOVE) ×2
GOWN STRL REUS W/ TWL LRG LVL3 (GOWN DISPOSABLE) ×2 IMPLANT
GOWN STRL REUS W/ TWL XL LVL3 (GOWN DISPOSABLE) ×1 IMPLANT
GOWN STRL REUS W/TWL LRG LVL3 (GOWN DISPOSABLE) ×6
GOWN STRL REUS W/TWL XL LVL3 (GOWN DISPOSABLE) ×3
K-WIRE ACE 1.6X6 (WIRE) ×3
KIT BASIN OR (CUSTOM PROCEDURE TRAY) ×3 IMPLANT
KIT ROOM TURNOVER OR (KITS) ×3 IMPLANT
KWIRE ACE 1.6X6 (WIRE) ×1 IMPLANT
NEEDLE 22X1 1/2 (OR ONLY) (NEEDLE) IMPLANT
NS IRRIG 1000ML POUR BTL (IV SOLUTION) ×3 IMPLANT
PACK ORTHO EXTREMITY (CUSTOM PROCEDURE TRAY) ×3 IMPLANT
PAD ARMBOARD 7.5X6 YLW CONV (MISCELLANEOUS) ×6 IMPLANT
PAD CAST 4YDX4 CTTN HI CHSV (CAST SUPPLIES) ×2 IMPLANT
PADDING CAST COTTON 4X4 STRL (CAST SUPPLIES) ×6
PLATE LAPIDS LN EXT COMP (Plate) ×3 IMPLANT
SCREW CORT T15 24X3.5XST LCK (Screw) ×1 IMPLANT
SCREW CORT T15 30X3.5XST LCK (Screw) ×1 IMPLANT
SCREW CORTICAL 3.5X24MM (Screw) ×3 IMPLANT
SCREW CORTICAL 3.5X30MM (Screw) ×3 IMPLANT
SCREW LAG  RD HEAD 4.0 32 LTH (Screw) ×2 IMPLANT
SCREW LAG  RD HEAD 4.0 42 LTH (Screw) ×2 IMPLANT
SCREW LAG  RD HEAD 4.0 50 LTH (Screw) ×2 IMPLANT
SCREW LAG RD HEAD 4.0 32 LTH (Screw) ×1 IMPLANT
SCREW LAG RD HEAD 4.0 42 LTH (Screw) ×1 IMPLANT
SCREW LAG RD HEAD 4.0 50 LTH (Screw) ×1 IMPLANT
SCREW LOCK CORT STAR 3.5X14 (Screw) ×3 IMPLANT
SCREW LOCK CORT STAR 3.5X18 (Screw) ×3 IMPLANT
SCREW LOCK CORT STAR 3.5X20 (Screw) ×3 IMPLANT
SCREW LP 3.5 (Screw) ×9 IMPLANT
SLEEVE DRILL 2.7 DIST TIB (TRAUMA) ×1 IMPLANT
SOAP 2 % CHG 4 OZ (WOUND CARE) ×3 IMPLANT
SPLINT PLASTER CAST XFAST 5X30 (CAST SUPPLIES) ×1 IMPLANT
SPLINT PLASTER XFAST SET 5X30 (CAST SUPPLIES) ×2
SPONGE GAUZE 4X4 12PLY STER LF (GAUZE/BANDAGES/DRESSINGS) ×3 IMPLANT
STAPLER VISISTAT 35W (STAPLE) IMPLANT
SUCTION FRAZIER TIP 10 FR DISP (SUCTIONS) ×3 IMPLANT
SUT ETHILON 2 0 FS 18 (SUTURE) ×9 IMPLANT
SUT PDS AB 0 CT 36 (SUTURE) ×3 IMPLANT
SUT PROLENE 3 0 PS 2 (SUTURE) ×3 IMPLANT
SUT VIC AB 2-0 CT1 27 (SUTURE) ×3
SUT VIC AB 2-0 CT1 TAPERPNT 27 (SUTURE) ×1 IMPLANT
SUT VIC AB 3-0 PS2 18 (SUTURE) ×3
SUT VIC AB 3-0 PS2 18XBRD (SUTURE) ×1 IMPLANT
SYR CONTROL 10ML LL (SYRINGE) IMPLANT
TOWEL OR 17X24 6PK STRL BLUE (TOWEL DISPOSABLE) ×3 IMPLANT
TOWEL OR 17X26 10 PK STRL BLUE (TOWEL DISPOSABLE) ×3 IMPLANT
TUBE CONNECTING 12'X1/4 (SUCTIONS) ×1
TUBE CONNECTING 12X1/4 (SUCTIONS) ×2 IMPLANT
WATER STERILE IRR 1000ML POUR (IV SOLUTION) ×3 IMPLANT

## 2014-11-23 NOTE — Brief Op Note (Signed)
11/23/2014  10:20 AM  PATIENT:  Pamela Snyder  69 y.o. female  PRE-OPERATIVE DIAGNOSIS: 1.  Right Charcot rocker bottom foot deformity      2.  Tight right heelcord  POST-OPERATIVE DIAGNOSIS:   Same  Procedure(s): 1.  RIGHT MID FOOT OSTEOTOMY 2.  RIGHT MID FOOT ARTHRODESIS 3.  RIGHT ACHILLES PERCUTANEOUS TENDON LENGTHENING 4.  AP, lateral and oblique xrays of the foot  SURGEON:  Wylene Simmer, MD  ASSISTANT: n/a  ANESTHESIA:   General, regional  EBL:  minimal   TOURNIQUET:  2:00 at 797 mm Hg  COMPLICATIONS:  None apparent  DISPOSITION:  Extubated, awake and stable to recovery.  DICTATION ID:  [id number]

## 2014-11-23 NOTE — Evaluation (Addendum)
Physical Therapy Evaluation Patient Details Name: Pamela Snyder MRN: [id number] DOB: [birth year] Today's Date: 11/23/2014   History of Present Illness  Patient is a 69 y/o female s/p Rt midfoot osteotomy, arthrodesis and achilles tendon lengthening.PMH of HTN, migraine, DM and charcot foot.    Clinical Impression  Patient presents with generalized weakness, NWB status RLE and balance deficits impacting mobility. Pt Mod I PTA using SPC for mobility. Requires Min A to transfer to chair. Pt will not have support at home at d/c as spouse works. Highly motivated. Pt would benefit from skilled PT and ST SNF to improve transfers, gait, balance and mobility so pt can maximize independence and return to PLOF.    Follow Up Recommendations SNF;Supervision/Assistance - 24 hour    Equipment Recommendations  Other (comment) (defer to next venue)    Recommendations for Other Services OT consult     Precautions / Restrictions Precautions Precautions: Fall Restrictions Weight Bearing Restrictions: Yes RLE Weight Bearing: Non weight bearing      Mobility  Bed Mobility Overal bed mobility: Modified Independent             General bed mobility comments: HOB elevated, use of rails.  Transfers Overall transfer level: Needs assistance   Transfers: Squat Pivot Transfers     Squat pivot transfers: Min assist     General transfer comment: Min A squat pivot transfer to chair towards left side. Cues for technique. Compliant with NWB RLE.  Ambulation/Gait                Stairs            Wheelchair Mobility    Modified Rankin (Stroke Patients Only)       Balance Overall balance assessment: Needs assistance Sitting-balance support: Feet supported;No upper extremity supported Sitting balance-Leahy Scale: Good     Standing balance support: During functional activity Standing balance-Leahy Scale: Poor                               Pertinent Vitals/Pain  Pain Assessment: No/denies pain    Home Living Family/patient expects to be discharged to:: Skilled nursing facility Living Arrangements: Spouse/significant other (Spouse works) Available Help at Discharge: Family;Available PRN/intermittently Type of Home: House Home Access: Stairs to enter Entrance Stairs-Rails: None Entrance Stairs-Number of Steps: 3 Home Layout: Multi-level Home Equipment: Cane - single point      Prior Function Level of Independence: Independent with assistive device(s)         Comments: Pt using SPC PTA. Drives. Active.      Hand Dominance        Extremity/Trunk Assessment   Upper Extremity Assessment: Defer to OT evaluation           Lower Extremity Assessment: Generalized weakness;RLE deficits/detail RLE Deficits / Details: Able to perform LAQ and wiggle toes. Diminished sensation in toes and RLE.       Communication   Communication: No difficulties  Cognition Arousal/Alertness: Awake/alert Behavior During Therapy: WFL for tasks assessed/performed Overall Cognitive Status: Within Functional Limits for tasks assessed                      General Comments      Exercises General Exercises - Lower Extremity Long Arc Quad: Right;5 reps;Seated      Assessment/Plan    PT Assessment Patient needs continued PT services  PT Diagnosis Generalized weakness;Difficulty walking   PT Problem  List Decreased strength;Impaired sensation;Decreased activity tolerance;Decreased balance;Decreased mobility;Decreased knowledge of use of DME  PT Treatment Interventions Balance training;Gait training;Functional mobility training;Patient/family education;Therapeutic activities;Therapeutic exercise;Wheelchair mobility training;DME instruction   PT Goals (Current goals can be found in the Care Plan section) Acute Rehab PT Goals Patient Stated Goal: to go to rehab to be more independent PT Goal Formulation: With patient Time For Goal Achievement:  12/07/14 Potential to Achieve Goals: Good    Frequency Min 3X/week   Barriers to discharge Decreased caregiver support Pt's spouse works    Co-evaluation               End of Session Equipment Utilized During Treatment: Gait belt Activity Tolerance: Patient tolerated treatment well Patient left: in chair;with call bell/phone within reach Nurse Communication: Mobility status         Time: 2841-3244 PT Time Calculation (min) (ACUTE ONLY): 21 min   Charges:   PT Evaluation $Initial PT Evaluation Tier I: 1 Procedure     PT G Codes:        Chibuikem Thang A Dua Mehler 11/23/2014, 5:08 PM Wray Kearns, [place], DPT [phone]

## 2014-11-23 NOTE — H&P (Signed)
Pamela Snyder is an 69 y.o. female.   Chief Complaint: right foot Charcot HPI: 69 y/o female with PMH of diabetes presents today for reconstruction of her right foot charcot deformity.  Past Medical History  Diagnosis Date  . Hypertension   . Diabetes mellitus without complication   . GERD (gastroesophageal reflux disease)   . Arthritis     Past Surgical History  Procedure Laterality Date  . Abdominal hysterectomy  99    oophorectomy bilateral  . Thyroidectomy  86    ? side  . Tonsillectomy      child  . Appendectomy      kid 34 yrs old    History reviewed. No pertinent family history. Social History:  reports that she quit smoking about 11 years ago. Her smoking use included Cigarettes. She has a 37 pack-year smoking history. She does not have any smokeless tobacco history on file. She reports that she does not drink alcohol or use illicit drugs.  Allergies: No Known Allergies  Medications Prior to Admission  Medication Sig Dispense Refill  . allopurinol (ZYLOPRIM) 300 MG tablet Take 300 mg by mouth daily.    . B Complex-C (SUPER B COMPLEX PO) Take 1 tablet by mouth daily.    [name] BIOTIN PO Take 1 tablet by mouth daily.    [name] CALCIUM-MAGNESIUM-ZINC PO Take 1 tablet by mouth daily.    . Cholecalciferol 1000 UNITS capsule Take 1,000 Units by mouth daily.    . folic acid (FOLVITE) 1 MG tablet Take 1 mg by mouth daily.    . Ginkgo Biloba 40 MG TABS Take 1 tablet by mouth daily.    . metFORMIN (GLUCOPHAGE-XR) 500 MG 24 hr tablet Take 1 tablet by mouth daily.  4  . metoprolol succinate (TOPROL-XL) 100 MG 24 hr tablet Take 100 mg by mouth daily. Take with or immediately following a meal.    . Multiple Vitamins-Minerals (MULTIVITAMIN WITH MINERALS) tablet Take 1 tablet by mouth daily.    [name] olmesartan-hydrochlorothiazide (BENICAR HCT) 40-25 MG per tablet Take 0.5 tablets by mouth daily.    . pantoprazole (PROTONIX) 40 MG tablet Take 40 mg by mouth daily.    . potassium chloride SA  (K-DUR,KLOR-CON) 20 MEQ tablet Take 20 mEq by mouth daily as needed.    . pregabalin (LYRICA) 150 MG capsule Take 150 mg by mouth 3 (three) times daily.    . traMADol (ULTRAM) 50 MG tablet Take 50 mg by mouth every 8 (eight) hours as needed.    . traZODone (DESYREL) 50 MG tablet Take 150 mg by mouth at bedtime as needed.     . furosemide (LASIX) 20 MG tablet Take 20 mg by mouth daily as needed for fluid.      Results for orders placed or performed during the hospital encounter of 11/23/14 (from the past 48 hour(s))  Glucose, capillary     Status: Abnormal   Collection Time: 11/23/14  6:27 AM  Result Value Ref Range   Glucose-Capillary 128 (H) 65 - 99 mg/dL   No results found.  ROS  No recent f/c/n/v/wt loss  Blood pressure 98/54, pulse 94, temperature 98.1 F (36.7 C), temperature source Oral, resp. rate 20, height 5\' 5"  (1.651 m), weight 104.781 kg (231 lb), SpO2 96 %. Physical Exam  wn wd woman in nad.  A and O x 4.  Mood and affect normal.  EOMI.  resp unlabored.  R foot with midfoot collapse.  Skin healthy with no ulcers.  Heelcord is tight.  5/5 strength in PF and DF of the ankle.  No lymphadenopathy.  Sens to LT absent at the forefoot.  Assessment/Plan R midfoot charcot collapse and tight heelcord.  To OR for midfoot osteotomy, heelcord lengthening and midfoot arthrodesis.  The risks and benefits of the alternative treatment options have been discussed in detail.  The patient wishes to proceed with surgery and specifically understands risks of bleeding, infection, nerve damage, blood clots, need for additional surgery, amputation and death.   Wylene Simmer Nov 25, 2014, 7:24 AM

## 2014-11-23 NOTE — Care Management Note (Signed)
Case Management Note  Patient Details  Name: SHAUN ZUCCARO MRN: [id number] Date of Birth: 1945-12-14  Subjective/Objective:                    Action/Plan: UR completed . Await PT eval recommendations   Expected Discharge Date:     11-27-14              Expected Discharge Plan:     In-House Referral:     Discharge planning Services     Post Acute Care Choice:    Choice offered to:     DME Arranged:    DME Agency:     HH Arranged:    [place] Agency:     Status of Service:     Medicare Important Message Given:    Date Medicare IM Given:    Medicare IM give by:    Date Additional Medicare IM Given:    Additional Medicare Important Message give by:     If discussed at [place] of Stay Meetings, dates discussed:    Additional Comments:  Marilu Favre, RN 11/23/2014, 2:12 PM

## 2014-11-23 NOTE — Discharge Instructions (Signed)
Kamora Vossler, MD °[facility] ° °Please read the following information regarding your care after surgery. ° °Medications  °You only need a prescription for the narcotic pain medicine (ex. oxycodone, Percocet, Norco).  All of the other medicines listed below are available over the counter. °X acetominophen (Tylenol) 650 mg every 4-6 hours as you need for minor pain °X oxycodone as prescribed for moderate to severe pain °?  ° °Narcotic pain medicine (ex. oxycodone, Percocet, Vicodin) will cause constipation.  To prevent this problem, take the following medicines while you are taking any pain medicine. °X docusate sodium (Colace) 100 mg twice a day X senna (Senokot) 2 tablets twice a day ° °X To help prevent blood clots, take an aspirin (325 mg) once a day for a month after surgery.  You should also get up every hour while you are awake to move around.   ° °Weight Bearing °? Bear weight when you are able on your operated leg or foot. °? Bear weight only on the heel of your operated foot in the post-op shoe. °X Do not bear any weight on the operated leg or foot. ° °Cast / Splint / Dressing °X Keep your splint or cast clean and dry.  Don’t put anything (coat hanger, pencil, etc) down inside of it.  If it gets damp, use a hair dryer on the cool setting to dry it.  If it gets soaked, call the office to schedule an appointment for a cast change. °? Remove your dressing 3 days after surgery and cover the incisions with dry dressings.   ° °After your dressing, cast or splint is removed; you may shower, but do not soak or scrub the wound.  Allow the water to run over it, and then gently pat it dry. ° °Swelling °It is normal for you to have swelling where you had surgery.  To reduce swelling and pain, keep your toes above your nose for at least 3 days after surgery.  It may be necessary to keep your foot or leg elevated for several weeks.  If it hurts, it should be elevated. ° °Follow Up °Call my office at  [phone] when you are discharged from the hospital or surgery center to schedule an appointment to be seen two weeks after surgery. ° °Call my office at [phone] if you develop a fever >101.5° F, nausea, vomiting, bleeding from the surgical site or severe pain.   ° ° °

## 2014-11-23 NOTE — Anesthesia Preprocedure Evaluation (Addendum)
Anesthesia Evaluation  Patient identified by MRN, date of birth, ID band Patient awake    Reviewed: Allergy & Precautions, NPO status , Patient's Chart, lab work & pertinent test results, reviewed documented beta blocker date and time   Airway Mallampati: II  TM Distance: >3 FB Neck ROM: Full    Dental  (+) Teeth Intact   Pulmonary former smoker,  breath sounds clear to auscultation        Cardiovascular hypertension, Rhythm:Regular     Neuro/Psych    GI/Hepatic GERD-  Controlled,  Endo/Other  diabetes, Well Controlled, Type 2, Oral Hypoglycemic Agents  Renal/GU      Musculoskeletal  (+) Arthritis -,   Abdominal   Peds  Hematology   Anesthesia Other Findings   Reproductive/Obstetrics                           Anesthesia Physical Anesthesia Plan  ASA: III  Anesthesia Plan: General   Post-op Pain Management:    Induction: Intravenous  Airway Management Planned: Oral ETT and LMA  Additional Equipment:   Intra-op Plan:   Post-operative Plan: Extubation in OR  Informed Consent: I have reviewed the patients History and Physical, chart, labs and discussed the procedure including the risks, benefits and alternatives for the proposed anesthesia with the patient or authorized representative who has indicated his/her understanding and acceptance.   Dental advisory given  Plan Discussed with: CRNA, Anesthesiologist and Surgeon  Anesthesia Plan Comments:        Anesthesia Quick Evaluation

## 2014-11-23 NOTE — Transfer of Care (Signed)
Immediate Anesthesia Transfer of Care Note  Patient: Pamela Snyder  Procedure(s) Performed: Procedure(s): RIGHT MID FOOT OSTEOTOMY (Right) RIGHT MID FOOT ARTHRODESIS (Right) RIGHT ACHILLES PERCUTANEOUS TENDON LENGTHENING (Right)  Patient Location: PACU  Anesthesia Type:General and Regional  Level of Consciousness: awake, alert  and oriented  Airway & Oxygen Therapy: Patient Spontanous Breathing and Patient connected to nasal cannula oxygen  Post-op Assessment: Report given to RN and Post -op Vital signs reviewed and stable  Post vital signs: Reviewed and stable  Last Vitals:  Filed Vitals:   11/23/14 1045  BP: 91/63  Pulse: 98  Temp:   Resp: 12    Complications: No apparent anesthesia complications

## 2014-11-23 NOTE — Anesthesia Procedure Notes (Addendum)
Procedure Name: LMA Insertion Date/Time: 11/23/2014 7:45 AM Performed by: Tamala Fothergill S Preoxygenation: Pre-oxygenation with 100% oxygen Intubation Type: IV induction Ventilation: Mask ventilation without difficulty LMA: LMA inserted LMA Size: 4.0 Number of attempts: 1 Placement Confirmation: breath sounds checked- equal and bilateral and positive ETCO2 Tube secured with: Tape Dental Injury: Teeth and Oropharynx as per pre-operative assessment    Anesthesia Regional Block:  Popliteal block  Pre-Anesthetic Checklist: ,, timeout performed, Correct Patient, Correct Site, Correct Laterality, Correct Procedure, Correct Position, site marked, Risks and benefits discussed,  Surgical consent,  Pre-op evaluation,  At surgeon's request and post-op pain management  Laterality: Lower and Right  Prep: chloraprep       Needles:  Injection technique: Single-shot  Needle Type: Echogenic Stimulator Needle          Additional Needles:  Procedures: ultrasound guided (picture in chart) and nerve stimulator Popliteal block  Nerve Stimulator or Paresthesia:  Response: plantar, 0.5 mA,   Additional Responses:   Narrative:  Injection made incrementally with aspirations every 5 mL.  Performed by: Personally  Anesthesiologist: Indiyah Paone, CHRIS  Additional Notes: H+P and labs reviewed, risks and benefits discussed with patient, procedure tolerated well without complications

## 2014-11-23 NOTE — Anesthesia Postprocedure Evaluation (Signed)
  Anesthesia Post-op Note  Patient: Pamela Snyder  Procedure(s) Performed: Procedure(s): RIGHT MID FOOT OSTEOTOMY (Right) RIGHT MID FOOT ARTHRODESIS (Right) RIGHT ACHILLES PERCUTANEOUS TENDON LENGTHENING (Right)  Patient Location: PACU  Anesthesia Type:General and Regional  Level of Consciousness: awake  Airway and Oxygen Therapy: Patient Spontanous Breathing  Post-op Pain: none  Post-op Assessment: Post-op Vital signs reviewed, Patient's Cardiovascular Status Stable, Respiratory Function Stable, Patent Airway, No signs of Nausea or vomiting and Pain level controlled  Post-op Vital Signs: Reviewed and stable  Last Vitals:  Filed Vitals:   11/23/14 1300  BP: 104/57  Pulse: 83  Temp: 36.4 C  Resp: 13    Complications: No apparent anesthesia complications

## 2014-11-24 LAB — GLUCOSE, CAPILLARY
Glucose-Capillary: 148 mg/dL — ABNORMAL HIGH (ref 65–99)
Glucose-Capillary: 135 mg/dL — ABNORMAL HIGH (ref 65–99)
Glucose-Capillary: 114 mg/dL — ABNORMAL HIGH (ref 65–99)
Glucose-Capillary: 149 mg/dL — ABNORMAL HIGH (ref 65–99)

## 2014-11-24 MED ORDER — IBUPROFEN 800 MG PO TABS
800.0000 mg | ORAL_TABLET | Freq: Once | ORAL | Status: AC | PRN
  Administered 2014-11-24: 800 mg via ORAL
  Filled 2014-11-24: qty 1

## 2014-11-24 NOTE — Progress Notes (Signed)
Physical Therapy Treatment Patient Details Name: Pamela Snyder MRN: [id number] DOB: August 06, 1945 Today's Date: 11/24/2014    History of Present Illness Patient is a 69 y/o female s/p Rt midfoot osteotomy, arthrodesis and achilles tendon lengthening.PMH of HTN, migraine, DM and charcot foot.    PT Comments    Progressing slowly towards physical therapy goals. Tolerated 10 feet + additional 5 feet of gait training today with min assist for balance. Reviewed therapeutic exercises. Patient will continue to benefit from skilled physical therapy services to further improve independence with functional mobility.   Follow Up Recommendations  SNF;Supervision/Assistance - 24 hour     Equipment Recommendations   (defer to next venue)    Recommendations for Other Services OT consult     Precautions / Restrictions Precautions Precautions: Fall Restrictions Weight Bearing Restrictions: Yes RLE Weight Bearing: Non weight bearing    Mobility  Bed Mobility Overal bed mobility: Modified Independent             General bed mobility comments: HOB elevated, use of rails.  Transfers Overall transfer level: Needs assistance Equipment used: Rolling walker (2 wheeled) Transfers: Sit to/from Stand Sit to Stand: Min assist         General transfer comment: Min assist for stability, VC for hand placement. Performed from bed and recliner. maintains NWB on RLE  Ambulation/Gait Ambulation/Gait assistance: Min assist Ambulation Distance (Feet): 10 Feet (additonal bout of 5 feet) Assistive device: Rolling walker (2 wheeled) Gait Pattern/deviations:  ("hop-to" pattern) Gait velocity: decreased Gait velocity interpretation: Below normal speed for age/gender General Gait Details: Educated on safe DME use with a rolling walker. VC for technique which was also demonstrated to patient. Encouraged to lock elbows and adduct UEs for increased support. VC for NWB on RLE at times. Min assist for balance  with one episode of loss of balance, recovering with assist from PT for walker placement and stability.    Stairs            Wheelchair Mobility    Modified Rankin (Stroke Patients Only)       Balance                                    Cognition Arousal/Alertness: Awake/alert Behavior During Therapy: WFL for tasks assessed/performed Overall Cognitive Status: Within Functional Limits for tasks assessed                      Exercises General Exercises - Lower Extremity Ankle Circles/Pumps: AROM;Left;10 reps;Seated Quad Sets: Strengthening;Both;10 reps;Seated Long Arc Quad: Strengthening;10 reps;Right;Seated Hip Flexion/Marching: Strengthening;Both;10 reps;Seated Other Exercises Other Exercises: flex/extension toes on Rt foot x10 for AROM    General Comments        Pertinent Vitals/Pain Pain Assessment: No/denies pain    Home Living                      Prior Function            PT Goals (current goals can now be found in the care plan section) Acute Rehab PT Goals PT Goal Formulation: With patient Time For Goal Achievement: 12/07/14 Potential to Achieve Goals: Good Progress towards PT goals: Progressing toward goals    Frequency  Min 4X/week    PT Plan Current plan remains appropriate    Co-evaluation             End of  Session Equipment Utilized During Treatment: Gait belt Activity Tolerance: Patient tolerated treatment well Patient left: in chair;with call bell/phone within reach     Time: 1159-1229 PT Time Calculation (min) (ACUTE ONLY): 30 min  Charges:  $Gait Training: 8-22 mins $Therapeutic Exercise: 8-22 mins                    G Codes:      Ellouise Newer December 10, 2014, 1:06 PM Camille Bal [place], [place]

## 2014-11-24 NOTE — Clinical Social Work Note (Signed)
CSW attempted to see patient 3 times throughout the day she did not wake up and CSW was unable to assess, will ask weekend CSW to complete assessment on patient.  Jones Broom. [facility], MSW, [place] 11/24/2014 6:06 PM

## 2014-11-24 NOTE — Op Note (Signed)
[name], [name]                  ACCOUNT NO.:  [id number]  MEDICAL RECORD NO.:  [id number]  LOCATION:  [id number]                        FACILITY:  [facility]  PHYSICIAN:  Wylene Simmer, MD        DATE OF BIRTH:  1945-12-15  DATE OF PROCEDURE:  11/23/2014 DATE OF DISCHARGE:                              OPERATIVE REPORT   PREOPERATIVE DIAGNOSIS: 1. Right Charcot rocker bottom foot deformity. 2. Tight right heel cord.  POSTOPERATIVE DIAGNOSIS: 1. Right Charcot rocker bottom foot deformity. 2. Tight right heel cord.  PROCEDURE: 1. Percutaneous right tendo-Achilles lengthening. 2. Right midfoot osteotomy. 3. Right midfoot arthrodesis. 4. AP, lateral, and oblique radiographs of the right foot.  SURGEON:  Wylene Simmer, MD  ANESTHESIA:  General, regional.  ESTIMATED BLOOD LOSS:  Minimal.  TIME OF TOURNIQUET:  Two hours at 250 mmHg.  COMPLICATIONS:  None apparent.  DISPOSITION:  Extubated, awake, and stable to recovery.  INDICATIONS FOR PROCEDURE:  The patient is a 69 year old woman with past medical history significant for type 2 diabetes, complicated by peripheral neuropathy.  She developed a Charcot foot deformity and has had collapse of the longitudinal arch to a rocker-bottom condition.  She has a very prominent cuboid on the plantar surface of the foot and develops recurrent calluses in this area as well as pain.  She has failed nonoperative treatment to date including activity modification, oral anti-inflammatories, shoe ware modification and custom orthotics. She presents now for osteotomy of the midfoot with arthrodesis to correct the Charcot deformity.  She also has a tight heel cord and will need Achilles tendon lengthening to allow correction of the hindfoot deformity.  She has gone her hemoglobin A1c down to less than 7.  She understands the risks and benefits, the alternative treatment options, and elects surgical treatment.  She specifically understands risks  of bleeding, infection, nerve damage, blood clots, need for additional surgery, continued pain, amputation, and death.  PROCEDURE IN DETAIL:  After preoperative consent was obtained and the correct operative site was identified, the patient was brought to the operating room and placed supine on the operating table.  General anesthesia was induced.  Preoperative antibiotics were administered. Surgical time-out was taken.  The right lower extremity was prepped and draped in standard sterile fashion with tourniquet around the thigh. The extremity was exsanguinated and the tourniquet was inflated to 250 mmHg.  A triple hemisection tendo-Achilles lengthening was then performed percutaneously with a #15 blade.  The ankle was then dorsiflexed 30 degrees with the knee extended.  A medial incision was then made over the tarsometatarsal naviculocuneiform and talonavicular joints.  The incision was made to create full-thickness flaps with dissection in the subperiosteal fashion.  Subperiosteal dissection was carried over the dorsal and plantar aspect of the midfoot elevating all of the soft tissues and exposing the joint appropriately.  Matching incision was then made on the lateral aspect of the foot.  Again, full- thickness flaps were developed plantarly and dorsally connecting with the medial incision.  K-wires were then inserted at the levels of the proposed osteotomies.  Appropriate position of the K-wires was verified with fluoroscopic images in the AP and lateral  planes.  A biplanar osteotomy was then made with an oscillating saw taking care to protect the soft tissues plantarly and dorsally.  This removed a wedge of bone that was larger on the bottom, then on the top, and larger medially, then laterally.  All of the cut bone was removed.  The wound was irrigated.  The joints were reduced.  They were provisionally pinned. AP and lateral radiographs confirmed appropriate restoration of  the longitudinal arch and appropriate alignment of the forefoot with the hindfoot.  K-wires were then inserted from the fourth and fifth metatarsals across to the cuboid.  These were utilized to insert 4.0 cannulated screws which compressed the lateral half of the osteotomy appropriately and then a K-wire was inserted from the dorsal aspect of the first metatarsal and across to the medial aspect of the navicular. It was inserted and was again noted to compress the osteotomy site appropriately.  A Lapidus plate was selected from the [employer] ALPS foot extension set.  It was contoured to fit the medial column.  It was secured proximally with 2 nonlocking and 1 locking screw.  Distally, it was secured with 2 locking and 2 nonlocking screws.  The plate was secured appropriately to the bone and the nonlocking screws were utilized across the arthrodesis site for additional fixation.  AP and lateral radiographs confirmed appropriate position and length of all hardware and appropriate correction of the foot deformity.  The wound was then irrigated copiously.  A 500 mg of vancomycin powder was placed within the deep portion of the wound both dorsally and plantarly as well as at the superficial portion of the wound medially and laterally.  The full-thickness sleeve of soft tissue was then repaired with inverted simple and figure-of-eight sutures of 0 PDS.  Care was taken to repair the peroneus brevis and longus back to their appropriate positions as well as the tibialis anterior and tibialis posterior tendons back to their appropriate positions.  The skin incisions were closed with horizontal mattresses of 2-0 nylon.  Sterile dressings were applied followed by well-padded short-leg splint.  Tourniquet was released at exactly 2 hours after application of the dressings.  The patient was awakened from anesthesia and transported to the recovery room in stable condition.  FOLLOWUP PLAN:  The patient  will be nonweightbearing on the right lower extremity.  She will be admitted for physical therapy and occupational therapy.  She will start Lovenox for DVT prophylaxis while she is inpatient.  X-RAYS:  AP, oblique, and lateral radiographs of the right foot were obtained intraoperatively.  These show interval correction of the Charcot deformity with midfoot osteotomy.  Hardware is appropriately positioned and of the appropriate length.  No acute injuries are noted.     Wylene Simmer, MD     JH/MEDQ  D:  11/23/2014  T:  11/24/2014  Job:  [id number]

## 2014-11-24 NOTE — Progress Notes (Signed)
Subjective: 1 Day Post-Op Procedure(s) (LRB): RIGHT MID FOOT OSTEOTOMY (Right) RIGHT MID FOOT ARTHRODESIS (Right) RIGHT ACHILLES PERCUTANEOUS TENDON LENGTHENING (Right) Patient reports pain as mild.  No f/c/n/v.  Objective: Vital signs in last 24 hours: Temp:  [97.6 F (36.4 C)-99.9 F (37.7 C)] 97.8 F (36.6 C) (05/20 0619) Pulse Rate:  [80-107] 80 (05/20 0619) Resp:  [10-20] 17 (05/20 0619) BP: (89-116)/(49-72) 93/58 mmHg (05/20 0619) SpO2:  [92 %-97 %] 95 % (05/20 0619)  Intake/Output from previous day: 05/19 0701 - 05/20 0700 In: 3274 [P.O.:520; I.V.:2754] Out: 100 [Urine:100] Intake/Output this shift:     Recent Labs  11/23/14 1544  HGB 11.9*    Recent Labs  11/23/14 1544  WBC 16.1*  RBC 3.94  HCT 35.7*  PLT 275    Recent Labs  11/23/14 1544  CREATININE 1.27*   No results for input(s): LABPT, INR in the last 72 hours.  PE:  wn wd woman in nad.  R LE splinted.  NV exam at R foot unchanged from pre op.  Assessment/Plan: 1 Day Post-Op Procedure(s) (LRB): RIGHT MID FOOT OSTEOTOMY (Right) RIGHT MID FOOT ARTHRODESIS (Right) RIGHT ACHILLES PERCUTANEOUS TENDON LENGTHENING (Right) OOB with PT.  NWB.  Plan SNF Monday.  Wylene Simmer 11/24/2014, 7:37 AM

## 2014-11-24 NOTE — Care Management Note (Signed)
Case Management Note  Patient Details  Name: Pamela Snyder MRN: [id number] Date of Birth: 1945/10/28  Subjective/Objective:                    Action/Plan: PT recommending SNF , SW consulted   Expected Discharge Date:                  Expected Discharge Plan:  [place]  In-House Referral:  Clinical Social Work  Discharge planning Services     Post Acute Care Choice:    Choice offered to:     DME Arranged:    DME Agency:     HH Arranged:    [place] Agency:     Status of Service:  In process, will continue to follow  Medicare Important Message Given:  Yes Date Medicare IM Given:  11/24/14 Medicare IM give by:  Magdalen Spatz RN BSN  Date Additional Medicare IM Given:    Additional Medicare Important Message give by:     If discussed at [place] of Stay Meetings, dates discussed:    Additional Comments:  Marilu Favre, RN 11/24/2014, 1:30 PM

## 2014-11-25 LAB — GLUCOSE, CAPILLARY
Glucose-Capillary: 131 mg/dL — ABNORMAL HIGH (ref 65–99)
Glucose-Capillary: 166 mg/dL — ABNORMAL HIGH (ref 65–99)
Glucose-Capillary: 158 mg/dL — ABNORMAL HIGH (ref 65–99)
Glucose-Capillary: 137 mg/dL — ABNORMAL HIGH (ref 65–99)

## 2014-11-25 NOTE — Progress Notes (Signed)
Subjective: 2 Days Post-Op Procedure(s) (LRB): RIGHT MID FOOT OSTEOTOMY (Right) RIGHT MID FOOT ARTHRODESIS (Right) RIGHT ACHILLES PERCUTANEOUS TENDON LENGTHENING (Right) Patient reports pain as 1 on 0-10 scale.Doing fine this morning.SNF Monday.Afebrile.    Objective: Vital signs in last 24 hours: Temp:  [97.9 F (36.6 C)-98.8 F (37.1 C)] 98.6 F (37 C) (05/21 0600) Pulse Rate:  [86-98] 96 (05/21 0600) Resp:  [17-18] 18 (05/21 0600) BP: (83-100)/(47-54) 100/54 mmHg (05/21 0600) SpO2:  [93 %-97 %] 96 % (05/21 0600)  Intake/Output from previous day: 05/20 0701 - 05/21 0700 In: 720 [P.O.:720] Out: 600 [Urine:600] Intake/Output this shift:     Recent Labs  11/23/14 1544  HGB 11.9*    Recent Labs  11/23/14 1544  WBC 16.1*  RBC 3.94  HCT 35.7*  PLT 275    Recent Labs  11/23/14 1544  CREATININE 1.27*   No results for input(s): LABPT, INR in the last 72 hours.  Circulation in toes intact.  Assessment/Plan: 2 Days Post-Op Procedure(s) (LRB): RIGHT MID FOOT OSTEOTOMY (Right) RIGHT MID FOOT ARTHRODESIS (Right) RIGHT ACHILLES PERCUTANEOUS TENDON LENGTHENING (Right) Discharge to SNF Monday.  Pamela Snyder A 11/25/2014, 8:19 AM

## 2014-11-25 NOTE — Clinical Social Work Placement (Addendum)
   CLINICAL SOCIAL WORK PLACEMENT  NOTE  Date:  11/25/2014  Patient Details  Name: Pamela Snyder MRN: [id number] Date of Birth: [birth year]  Clinical Social Work is seeking post-discharge placement for this patient at the [place] level of care (*CSW will initial, date and re-position this form in  chart as items are completed):  Yes   Patient/family provided with [facility] Work Department's list of facilities offering this level of care within the geographic area requested by the patient (or if unable, by the patient's family).  Yes   Patient/family informed of their freedom to choose among providers that offer the needed level of care, that participate in Medicare, Medicaid or managed care program needed by the patient, have an available bed and are willing to accept the patient.  Yes   Patient/family informed of [facility]'s ownership interest in [facility] and [facility], as well as of the fact that they are under no obligation to receive care at these facilities.  PASRR submitted to EDS on 11/25/14     PASRR number received on 11/25/14     Existing PASRR number confirmed on       FL2 transmitted to all facilities in geographic area requested by pt/family on 11/25/14     FL2 transmitted to all facilities within larger geographic area on       Patient informed that his/her managed care company has contracts with or will negotiate with certain facilities, including the following:         11-25-14 Evette Cristal, MSW, [place], updated 11/27/14)   Patient/family informed of bed offers received.  Patient chooses bed at  [facility] (Evette Cristal, MSW, [place], updated 11/27/14)   Physician recommends and patient chooses bed at      Patient to be transferred to  [facility] on   11/27/14 Evette Cristal, MSW, LCSWA, updated 11/27/14)   Patient to be transferred to facility by  Patient's husband personal care Evette Cristal, MSW, [place],  updated 11/27/14)      Patient family notified on  11/27/14 of transfer Evette Cristal, MSW, [place], updated 11/27/14)   Name of family member notified:   Patient notified her husband Evette Cristal, MSW, [place], updated 11/27/14)      PHYSICIAN Please sign FL2     Additional Comment:    _______________________________________________ Barbette Or, LCSW [phone]  Jones Broom. Norval Morton, MSW, [place] 11/27/2014 10:52 AM (Evette Cristal, MSW, [place], updated 11/27/14)

## 2014-11-25 NOTE — Clinical Social Work Note (Signed)
Clinical Social Worker spoke with [facility] weekend admissions coordinator ([place]) who states that a bed offer has been made for Monday 05/23.  CSW updated RN who will update patient.  CSW confirmed patient acceptance of bed offer with [facility].  Facility to make arrangements for paperwork on Monday.  CSW remains available for support and to facilitate patient discharge needs.  Barbette Or, [place]

## 2014-11-25 NOTE — Clinical Social Work Note (Signed)
Clinical Social Work Assessment  Patient Details  Name: Pamela Snyder MRN: [id number] Date of Birth: Apr 24, 1946  Date of referral:  11/25/14               Reason for consult:  Discharge Planning, Facility Placement                Permission sought to share information with:  Family Supports Permission granted to share information::  Yes, Verbal Permission Granted  Name::     Nekita Pita  Relationship::  Spouse  Contact Information:  [phone]  Housing/Transportation Living arrangements for the past 2 months:  [place] of Information:  Patient, Spouse Patient Interpreter Needed:  None Criminal Activity/Legal Involvement Pertinent to Current Situation/Hospitalization:  No - Comment as needed Significant Relationships:  Adult Children, Spouse Lives with:  Spouse Do you feel safe going back to the place where you live?  Yes Need for family participation in patient care:  Yes (Comment)  Care giving concerns:  Patient husband present at bedside and states that his job does not allow him the ability to provide patient with 24 hour care at home, therefore SNF placement short term would be in patient best interest.  Patient husband anxious to have patient return home once able to reach modified independent level.   Social Worker assessment / plan:  Holiday representative met with patient and patient husband at bedside to offer support and discuss patient plans at discharge.  Patient states that she lives at home with her husband who is still working.  Patient and husband both agree with [facility]NF placement and request [facility].  CSW initiated search to [facility] and will notify facility of patient preference.  Patient states that her second option would be [facility].  Patient mother is currently at [facility] and she would prefer to not be placed in that facility for rehab needs.  CSW to follow up with patient and patient husband once bed offers are available.  CSW  remains available for support and to facilitate patient discharge needs once medically stable.  Employment status:  Retired Forensic scientist:  Medicare PT Recommendations:  [place] / Referral to community resources:  [place]  Patient/Family's Response to care:  Patient and patient husband agreeable with discharge plans and are hopeful for [employer].  Patient and spouse verbalized their understanding and appreciation for CSW role.  Patient/Family's Understanding of and Emotional Response to Diagnosis, Current Treatment, and Prognosis:  Patient is very aware of her diagnosis and treatment plan.  Patient and family realistic regarding patient rehab needs and ability to return home with family.  Patient and family appreciative of hospital support and anxious to begin rehab and return home.  Emotional Assessment Appearance:  Appears stated age Attitude/Demeanor/Rapport:   (Cooperative and Appreciative) Affect (typically observed):  Accepting, Hopeful, Pleasant, Appropriate, Happy, Calm Orientation:  Oriented to Self, Oriented to Place, Oriented to  Time, Oriented to Situation Alcohol / Substance use:  Not Applicable Psych involvement (Current and /or in the community):  No (Comment)  Discharge Needs  Concerns to be addressed:  Discharge Planning Concerns, Care Coordination Readmission within the last 30 days:  No Current discharge risk:  Physical Impairment Barriers to Discharge:  Continued Medical Work up  [employer], [place]

## 2014-11-26 LAB — GLUCOSE, CAPILLARY
Glucose-Capillary: 129 mg/dL — ABNORMAL HIGH (ref 65–99)
Glucose-Capillary: 130 mg/dL — ABNORMAL HIGH (ref 65–99)
Glucose-Capillary: 155 mg/dL — ABNORMAL HIGH (ref 65–99)
Glucose-Capillary: 154 mg/dL — ABNORMAL HIGH (ref 65–99)

## 2014-11-26 MED ORDER — DOCUSATE SODIUM 100 MG PO CAPS: 100.0000 mg | ORAL_CAPSULE | Freq: Two times a day (BID) | ORAL | Status: DC

## 2014-11-26 MED ORDER — METHOCARBAMOL 500 MG PO TABS: 500.0000 mg | ORAL_TABLET | Freq: Four times a day (QID) | ORAL | Status: DC | PRN

## 2014-11-26 MED ORDER — ASPIRIN 325 MG PO TBEC: 325.0000 mg | DELAYED_RELEASE_TABLET | Freq: Every day | ORAL | Status: DC

## 2014-11-26 MED ORDER — OXYCODONE HCL 5 MG PO TABS: 5.0000 mg | ORAL_TABLET | ORAL | Status: DC | PRN

## 2014-11-26 MED ORDER — ACETAMINOPHEN 325 MG PO TABS: 650.0000 mg | ORAL_TABLET | Freq: Four times a day (QID) | ORAL | Status: DC | PRN

## 2014-11-26 MED ORDER — SENNA 8.6 MG PO TABS: 1.0000 | ORAL_TABLET | Freq: Two times a day (BID) | ORAL | Status: DC

## 2014-11-26 NOTE — Progress Notes (Signed)
Subjective: 3 Days Post-Op Procedure(s) (LRB): RIGHT MID FOOT OSTEOTOMY (Right) RIGHT MID FOOT ARTHRODESIS (Right) RIGHT ACHILLES PERCUTANEOUS TENDON LENGTHENING (Right) Patient reports pain as mild.  Well controlled with oral pain meds. No f/v/n/c.  Tolerating diet.  + BM.  Objective: Vital signs in last 24 hours: Temp:  [97.9 F (36.6 C)-98.4 F (36.9 C)] 98.4 F (36.9 C) (05/22 0615) Pulse Rate:  [88-97] 88 (05/22 0615) Resp:  [17-18] 18 (05/22 0615) BP: (96-115)/(48-57) 96/50 mmHg (05/22 0615) SpO2:  [94 %-98 %] 98 % (05/22 0615)  Intake/Output from previous day: 05/21 0701 - 05/22 0700 In: 940 [P.O.:940] Out: -  Intake/Output this shift:     Recent Labs  11/23/14 1544  HGB 11.9*    Recent Labs  11/23/14 1544  WBC 16.1*  RBC 3.94  HCT 35.7*  PLT 275    Recent Labs  11/23/14 1544  CREATININE 1.27*   No results for input(s): LABPT, INR in the last 72 hours.  PE:  wn wd woman in nad.  R LE splinted.  Toes with brisk cap refill.  Assessment/Plan: 3 Days Post-Op Procedure(s) (LRB): RIGHT MID FOOT OSTEOTOMY (Right) RIGHT MID FOOT ARTHRODESIS (Right) RIGHT ACHILLES PERCUTANEOUS TENDON LENGTHENING (Right) Up with therapy  NWB on R LE.  Plan d/c to SNF tomorrow.  Wylene Simmer 11/26/2014, 8:49 AM

## 2014-11-26 NOTE — Discharge Summary (Addendum)
Physician Discharge Summary  Patient ID: Pamela Snyder MRN: [id number] DOB/AGE: 68-Mar-1947 68 y.o.  Admit date: 11/23/2014 Discharge date: 11/27/2014  Admission Diagnoses:  Diabetes, htn, peripheral neuropathy, Charcot deformity of right foot  Discharge Diagnoses:  Active Problems:   Charcot foot due to diabetes mellitus same as above S/p R midfoot arthrodesis  Discharged Condition: stable  Hospital Course: The patient was admitted on 5/19 and taken to the OR for right midfoot osteotomy and arthrodesis to correct her Charcot rocker bottom foot deformity.  She tolerated the surgery well and remained on 6N for the duration of her hospital stay.  She was seen by PT, and SNF was recommended for acute rehab.  She's discharged to SNF on 5/23 for continued PT and OT to maintain NWB on the R LE for 6 weeks.  Consults: None  Significant Diagnostic Studies: none  Treatments: surgery: as above  Discharge Exam: Blood pressure 117/56, pulse 88, temperature 99.8 F (37.7 C), temperature source Oral, resp. rate 18, height 5\' 5"  (1.651 m), weight 104.781 kg (231 lb), SpO2 95 %. wn wd woman in nad.  R LE splinted.  A and Ox 4.  Mood and affect normal.  EOMI.  resp unlabored.  Brisk cap refill at right toes.  Disposition: to SNF      Discharge Instructions    Call MD / Call 911    Complete by:  As directed   If you experience chest pain or shortness of breath, CALL 911 and be transported to the hospital emergency room.  If you develope a fever above 101 F, pus (white drainage) or increased drainage or redness at the wound, or calf pain, call your surgeon's office.     Constipation Prevention    Complete by:  As directed   Drink plenty of fluids.  Prune juice may be helpful.  You may use a stool softener, such as Colace (over the counter) 100 mg twice a day.  Use MiraLax (over the counter) for constipation as needed.     Diet - low sodium heart healthy    Complete by:  As directed      Increase  activity slowly as tolerated    Complete by:  As directed      Non weight bearing    Complete by:  As directed   Laterality:  right  Extremity:  Lower            Medication List    STOP taking these medications        traMADol 50 MG tablet  Commonly known as:  ULTRAM      TAKE these medications        acetaminophen 325 MG tablet  Commonly known as:  TYLENOL  Take 2 tablets (650 mg total) by mouth every 6 (six) hours as needed for mild pain or moderate pain (or Fever >/= 101).     allopurinol 300 MG tablet  Commonly known as:  ZYLOPRIM  Take 300 mg by mouth daily.     aspirin 325 MG EC tablet  Take 1 tablet (325 mg total) by mouth daily.     BIOTIN PO  Take 1 tablet by mouth daily.     CALCIUM-MAGNESIUM-ZINC PO  Take 1 tablet by mouth daily.     Cholecalciferol 1000 UNITS capsule  Take 1,000 Units by mouth daily.     docusate sodium 100 MG capsule  Commonly known as:  COLACE  Take 1 capsule (100 mg total) by mouth 2 (  two) times daily.     folic acid 1 MG tablet  Commonly known as:  FOLVITE  Take 1 mg by mouth daily.     furosemide 20 MG tablet  Commonly known as:  LASIX  Take 20 mg by mouth daily as needed for fluid.     Ginkgo Biloba 40 MG Tabs  Take 1 tablet by mouth daily.     metFORMIN 500 MG 24 hr tablet  Commonly known as:  GLUCOPHAGE-XR  Take 1 tablet by mouth daily.     methocarbamol 500 MG tablet  Commonly known as:  ROBAXIN  Take 1 tablet (500 mg total) by mouth every 6 (six) hours as needed for muscle spasms.     metoprolol succinate 100 MG 24 hr tablet  Commonly known as:  TOPROL-XL  Take 100 mg by mouth daily. Take with or immediately following a meal.     multivitamin with minerals tablet  Take 1 tablet by mouth daily.     olmesartan-hydrochlorothiazide 40-25 MG per tablet  Commonly known as:  BENICAR HCT  Take 0.5 tablets by mouth daily.     oxyCODONE 5 MG immediate release tablet  Commonly known as:  Oxy IR/ROXICODONE  Take  1-2 tablets (5-10 mg total) by mouth every 4 (four) hours as needed for severe pain or breakthrough pain.     pantoprazole 40 MG tablet  Commonly known as:  PROTONIX  Take 40 mg by mouth daily.     potassium chloride SA 20 MEQ tablet  Commonly known as:  K-DUR,KLOR-CON  Take 20 mEq by mouth daily as needed.     pregabalin 150 MG capsule  Commonly known as:  LYRICA  Take 150 mg by mouth 3 (three) times daily.     senna 8.6 MG Tabs tablet  Commonly known as:  SENOKOT  Take 1 tablet (8.6 mg total) by mouth 2 (two) times daily.     SUPER B COMPLEX PO  Take 1 tablet by mouth daily.     traZODone 50 MG tablet  Commonly known as:  DESYREL  Take 150 mg by mouth at bedtime as needed.       Follow-up Information    Follow up with Jeiry Birnbaum, Jenny Reichmann, MD. Schedule an appointment as soon as possible for a visit in 2 weeks.   Specialty:  Orthopedic Surgery   Contact information:   [address] [place] [address] [phone]       Signed: Wylene Simmer 11/27/2014, 7:31 AM

## 2014-11-26 NOTE — Progress Notes (Signed)
Bath done

## 2014-11-27 DIAGNOSIS — R278 Other lack of coordination: Secondary | ICD-10-CM | POA: Diagnosis not present

## 2014-11-27 DIAGNOSIS — M6281 Muscle weakness (generalized): Secondary | ICD-10-CM | POA: Diagnosis not present

## 2014-11-27 DIAGNOSIS — G629 Polyneuropathy, unspecified: Secondary | ICD-10-CM | POA: Diagnosis not present

## 2014-11-27 DIAGNOSIS — R2681 Unsteadiness on feet: Secondary | ICD-10-CM | POA: Diagnosis not present

## 2014-11-27 DIAGNOSIS — Z981 Arthrodesis status: Secondary | ICD-10-CM | POA: Diagnosis not present

## 2014-11-27 DIAGNOSIS — N289 Disorder of kidney and ureter, unspecified: Secondary | ICD-10-CM | POA: Diagnosis not present

## 2014-11-27 DIAGNOSIS — R600 Localized edema: Secondary | ICD-10-CM | POA: Diagnosis not present

## 2014-11-27 DIAGNOSIS — K219 Gastro-esophageal reflux disease without esophagitis: Secondary | ICD-10-CM | POA: Diagnosis not present

## 2014-11-27 DIAGNOSIS — M1A9XX0 Chronic gout, unspecified, without tophus (tophi): Secondary | ICD-10-CM | POA: Diagnosis not present

## 2014-11-27 DIAGNOSIS — R262 Difficulty in walking, not elsewhere classified: Secondary | ICD-10-CM | POA: Diagnosis not present

## 2014-11-27 DIAGNOSIS — K5900 Constipation, unspecified: Secondary | ICD-10-CM | POA: Diagnosis not present

## 2014-11-27 DIAGNOSIS — Z4789 Encounter for other orthopedic aftercare: Secondary | ICD-10-CM | POA: Diagnosis not present

## 2014-11-27 DIAGNOSIS — M14671 Charcot's joint, right ankle and foot: Secondary | ICD-10-CM | POA: Diagnosis not present

## 2014-11-27 DIAGNOSIS — M6701 Short Achilles tendon (acquired), right ankle: Secondary | ICD-10-CM | POA: Diagnosis not present

## 2014-11-27 DIAGNOSIS — G4700 Insomnia, unspecified: Secondary | ICD-10-CM | POA: Diagnosis not present

## 2014-11-27 DIAGNOSIS — S91109S Unspecified open wound of unspecified toe(s) without damage to nail, sequela: Secondary | ICD-10-CM | POA: Diagnosis not present

## 2014-11-27 DIAGNOSIS — E118 Type 2 diabetes mellitus with unspecified complications: Secondary | ICD-10-CM | POA: Diagnosis not present

## 2014-11-27 DIAGNOSIS — R002 Palpitations: Secondary | ICD-10-CM | POA: Diagnosis not present

## 2014-11-27 DIAGNOSIS — E1142 Type 2 diabetes mellitus with diabetic polyneuropathy: Secondary | ICD-10-CM | POA: Diagnosis not present

## 2014-11-27 DIAGNOSIS — I10 Essential (primary) hypertension: Secondary | ICD-10-CM | POA: Diagnosis not present

## 2014-11-27 DIAGNOSIS — D72829 Elevated white blood cell count, unspecified: Secondary | ICD-10-CM | POA: Diagnosis not present

## 2014-11-27 LAB — GLUCOSE, CAPILLARY
Glucose-Capillary: 108 mg/dL — ABNORMAL HIGH (ref 65–99)
Glucose-Capillary: 148 mg/dL — ABNORMAL HIGH (ref 65–99)

## 2014-11-27 NOTE — Progress Notes (Addendum)
Pamela Snyder to be D/C'd Skilled nursing facility: [facility] per MD order.  Discussed with the patient and all questions fully answered.  VSS, Skin clean, dry and intact without evidence of skin break down, no evidence of skin tears noted. Surgical dressing intact.   IV catheter discontinued intact. Site without signs and symptoms of complications. Dressing and pressure applied.  SNF packet prepared by Education officer, museum and sent with patient to [facility]. Prescriptions included in packet.  Patient escorted via wheelchair and D/C to [facility] with husband. Attempted to call report to [facility]. Left message with nursing director. Awaiting callback.  Micki Riley 11/27/2014 9:12 AM

## 2014-11-27 NOTE — Care Management Note (Signed)
Case Management Note  Patient Details  Name: CERINITY ZYNDA MRN: [id number] Date of Birth: [birth year]  Subjective/Objective:                    Action/Plan: DC to SNF today   Expected Discharge Date:       11-27-14            Expected Discharge Plan:  [place]  In-House Referral:  Clinical Social Work  Discharge planning Services     Post Acute Care Choice:    Choice offered to:     DME Arranged:    DME Agency:     HH Arranged:    [place] Agency:     Status of Service:  Completed, signed off  Medicare Important Message Given:  Yes Date Medicare IM Given:  11/24/14 Medicare IM give by:  Magdalen Spatz RN BSN  Date Additional Medicare IM Given:  11/27/14 Additional Medicare Important Message give by:  Magdalen Spatz RN BSN   If discussed at [place] of Stay Meetings, dates discussed:    Additional Comments:  Marilu Favre, RN 11/27/2014, 11:51 AM

## 2014-11-27 NOTE — Progress Notes (Signed)
Subjective: 4 Days Post-Op Procedure(s) (LRB): RIGHT MID FOOT OSTEOTOMY (Right) RIGHT MID FOOT ARTHRODESIS (Right) RIGHT ACHILLES PERCUTANEOUS TENDON LENGTHENING (Right) Patient reports pain as mild.  No changes overnight.  Objective: Vital signs in last 24 hours: Temp:  [97.7 F (36.5 C)-99.8 F (37.7 C)] 99.8 F (37.7 C) (05/23 0520) Pulse Rate:  [87-89] 88 (05/23 0520) Resp:  [17-18] 18 (05/23 0520) BP: (98-117)/(56-63) 117/56 mmHg (05/23 0520) SpO2:  [95 %] 95 % (05/23 0520)  Intake/Output from previous day: 05/22 0701 - 05/23 0700 In: 1440 [P.O.:1440] Out: -  Intake/Output this shift:    No results for input(s): HGB in the last 72 hours. No results for input(s): WBC, RBC, HCT, PLT in the last 72 hours. No results for input(s): NA, K, CL, CO2, BUN, CREATININE, GLUCOSE, CALCIUM in the last 72 hours. No results for input(s): LABPT, INR in the last 72 hours.  wn wd woman in nad.  Assessment/Plan: 4 Days Post-Op Procedure(s) (LRB): RIGHT MID FOOT OSTEOTOMY (Right) RIGHT MID FOOT ARTHRODESIS (Right) RIGHT ACHILLES PERCUTANEOUS TENDON LENGTHENING (Right) Discharge to SNF   Discharge summary updated.  Wylene Simmer 11/27/2014, 7:32 AM

## 2014-11-27 NOTE — Clinical Social Work Note (Signed)
Patient to be d/c'ed today to [facility].  Patient and family agreeable to plans will transport via patient's family's car RN to call report.  Evette Cristal, MSW, [place]

## 2014-11-28 ENCOUNTER — Encounter (HOSPITAL_COMMUNITY): Payer: Self-pay | Admitting: Orthopedic Surgery

## 2014-11-28 ENCOUNTER — Non-Acute Institutional Stay (SKILLED_NURSING_FACILITY): Payer: Medicare Other | Admitting: Internal Medicine

## 2014-11-28 DIAGNOSIS — D72829 Elevated white blood cell count, unspecified: Secondary | ICD-10-CM | POA: Diagnosis not present

## 2014-11-28 DIAGNOSIS — M14671 Charcot's joint, right ankle and foot: Secondary | ICD-10-CM | POA: Diagnosis not present

## 2014-11-28 DIAGNOSIS — N289 Disorder of kidney and ureter, unspecified: Secondary | ICD-10-CM | POA: Diagnosis not present

## 2014-11-28 DIAGNOSIS — R002 Palpitations: Secondary | ICD-10-CM

## 2014-11-28 DIAGNOSIS — E1142 Type 2 diabetes mellitus with diabetic polyneuropathy: Secondary | ICD-10-CM

## 2014-11-28 DIAGNOSIS — M1A9XX0 Chronic gout, unspecified, without tophus (tophi): Secondary | ICD-10-CM

## 2014-11-28 DIAGNOSIS — K219 Gastro-esophageal reflux disease without esophagitis: Secondary | ICD-10-CM

## 2014-11-28 DIAGNOSIS — R2681 Unsteadiness on feet: Secondary | ICD-10-CM | POA: Diagnosis not present

## 2014-11-28 DIAGNOSIS — K5900 Constipation, unspecified: Secondary | ICD-10-CM

## 2014-11-28 DIAGNOSIS — R600 Localized edema: Secondary | ICD-10-CM

## 2014-11-28 DIAGNOSIS — G629 Polyneuropathy, unspecified: Secondary | ICD-10-CM | POA: Diagnosis not present

## 2014-11-28 DIAGNOSIS — G4700 Insomnia, unspecified: Secondary | ICD-10-CM

## 2014-11-28 NOTE — Progress Notes (Signed)
Patient ID: Pamela Snyder, female   DOB: [birth year], 69 y.o.   MRN: [id number]     [facility]   PCP: Tommy Medal, MD  Code Status: full code  No Known Allergies  Chief Complaint  Patient presents with  . New Admit To SNF     HPI:  69 year old patient is here for short term rehabilitation post hospital admission from 11/23/14-11/27/14 with Charcot deformity of right foot.  She was taken to the OR for right midfoot osteotomy and arthrodesis to correct her Charcot rocker bottom foot deformity.  She tolerated the surgery well and is NWB to RLE for 6 weeks. She is seen in her room today. Her pain is under control with current regimen. She had a bowel movement this am. Her appetite is good. She has occassional palpitations, mentions this is chronic for her with resting HR in 100s. She has some dyspnea on exertion. She feels her energy is overall improving. She has pmh of DM, HTN, peripheral neuropathy  Review of Systems:  Constitutional: Negative for fever, chills, diaphoresis.  HENT: Negative for headache, congestion, nasal discharge Eyes: Negative for eye pain, blurred vision, double vision and discharge.  Respiratory: Negative for cough, shortness of breath and wheezing.   Cardiovascular: Negative for chest pain, palpitations, leg swelling.  Gastrointestinal: Negative for heartburn, nausea, vomiting, abdominal pain Genitourinary: Negative for dysuria Musculoskeletal: Negative for back pain, falls Skin: Negative for itching, rash.  Neurological: Negative for dizziness. Has neuropathy in both her arm and legs Psychiatric/Behavioral: Negative for depression. Has insomnia   Past Medical History  Diagnosis Date  . Hypertension   . GERD (gastroesophageal reflux disease)   . Type II diabetes mellitus   . Thyroid goiter 1986  . Migraine     hx  . Arthritis     "right foot; spine; hands" (11/23/2014)  . Gout   . Charcot foot due to diabetes mellitus     Past Surgical History  Procedure Laterality Date  . Partial thymectomy  1986    ? side  . Osteotomy Right 11/23/2014    mid foot  . Arthrodesis Right 11/23/2014    mid foot  . Achilles tendon lengthening Right 11/23/2014  . Tonsillectomy  1954  . Appendectomy  1963  . Abdominal hysterectomy  1999    w/BSO  . Cardiac catheterization  08/2004   Social History:   reports that she quit smoking about 11 years ago. Her smoking use included Cigarettes. She has a 37 pack-year smoking history. She has never used smokeless tobacco. She reports that she does not drink alcohol or use illicit drugs.  No family history on file.  Medications: Patient's Medications  New Prescriptions   No medications on file  Previous Medications   ACETAMINOPHEN (TYLENOL) 325 MG TABLET    Take 2 tablets (650 mg total) by mouth every 6 (six) hours as needed for mild pain or moderate pain (or Fever >/= 101).   ALLOPURINOL (ZYLOPRIM) 300 MG TABLET    Take 300 mg by mouth daily.   ASPIRIN EC 325 MG EC TABLET    Take 1 tablet (325 mg total) by mouth daily.   B COMPLEX-C (SUPER B COMPLEX PO)    Take 1 tablet by mouth daily.   BIOTIN PO    Take 1 tablet by mouth daily.   CALCIUM-MAGNESIUM-ZINC PO    Take 1 tablet by mouth daily.   CHOLECALCIFEROL 1000 UNITS CAPSULE    Take 1,000 Units  by mouth daily.   DOCUSATE SODIUM (COLACE) 100 MG CAPSULE    Take 1 capsule (100 mg total) by mouth 2 (two) times daily.   FOLIC ACID (FOLVITE) 1 MG TABLET    Take 1 mg by mouth daily.   FUROSEMIDE (LASIX) 20 MG TABLET    Take 20 mg by mouth daily as needed for fluid.   GINKGO BILOBA 40 MG TABS    Take 1 tablet by mouth daily.   METFORMIN (GLUCOPHAGE-XR) 500 MG 24 HR TABLET    Take 1 tablet by mouth daily.   METHOCARBAMOL (ROBAXIN) 500 MG TABLET    Take 1 tablet (500 mg total) by mouth every 6 (six) hours as needed for muscle spasms.   METOPROLOL SUCCINATE (TOPROL-XL) 100 MG 24 HR TABLET    Take 100 mg by mouth daily. Take with or  immediately following a meal.   MULTIPLE VITAMINS-MINERALS (MULTIVITAMIN WITH MINERALS) TABLET    Take 1 tablet by mouth daily.   OLMESARTAN-HYDROCHLOROTHIAZIDE (BENICAR HCT) 40-25 MG PER TABLET    Take 0.5 tablets by mouth daily.   OXYCODONE (OXY IR/ROXICODONE) 5 MG IMMEDIATE RELEASE TABLET    Take 1-2 tablets (5-10 mg total) by mouth every 4 (four) hours as needed for severe pain or breakthrough pain.   PANTOPRAZOLE (PROTONIX) 40 MG TABLET    Take 40 mg by mouth daily.   POTASSIUM CHLORIDE SA (K-DUR,KLOR-CON) 20 MEQ TABLET    Take 20 mEq by mouth daily as needed.   PREGABALIN (LYRICA) 150 MG CAPSULE    Take 150 mg by mouth 3 (three) times daily.   SENNA (SENOKOT) 8.6 MG TABS TABLET    Take 1 tablet (8.6 mg total) by mouth 2 (two) times daily.   TRAZODONE (DESYREL) 50 MG TABLET    Take 150 mg by mouth at bedtime as needed.   Modified Medications   No medications on file  Discontinued Medications   No medications on file     Physical Exam: Filed Vitals:   11/28/14 0817  BP: 122/78  Pulse: 83  Temp: 97 F (36.1 C)  Resp: 18  SpO2: 97%    General- elderly female, obese, in no acute distress Head- normocephalic, atraumatic Throat- moist mucus membrane Eyes- PERRLA, EOMI, no pallor, no icterus, no discharge, normal conjunctiva, normal sclera Neck- no cervical lymphadenopathy Cardiovascular- normal s1,s2, no murmurs, palpable radial pulses, good dorsalis pedis in left foot, trace  leg edema Respiratory- bilateral clear to auscultation, no wheeze, no rhonchi, no crackles, no use of accessory muscles Abdomen- bowel sounds present, soft, non tender Musculoskeletal- able to move all 4 extremities, generalized weakness  Neurological- no focal deficit Skin- warm and dry, right foot dressing dry and clean Psychiatry- alert and oriented to person, place and time, normal mood and affect    Labs reviewed: Basic Metabolic Panel:  Recent Labs  11/15/14 1139 11/23/14 1544  NA 134*  --    K 3.9  --   CL 99*  --   CO2 24  --   GLUCOSE 118*  --   BUN 23*  --   CREATININE 1.66* 1.27*  CALCIUM 9.4  --    CBC:  Recent Labs  11/15/14 1139 11/23/14 1544  WBC 16.0* 16.1*  HGB 12.0 11.9*  HCT 37.1 35.7*  MCV 90.9 90.6  PLT 345 275   CBG:  Recent Labs  11/26/14 2151 11/27/14 0745 11/27/14 1143  GLUCAP 154* 108* 148*     Assessment/Plan  Unsteady gait Post recent  surgical repair of right foot. Will have her work with physical therapy and occupational therapy team to help with gait training and muscle strengthening exercises.fall precautions. Skin care. Encourage to be out of bed.   Right foot charcot deformity S/p right midfoot osteotomy and arthrodesis. Will have patient work with PT/OT as tolerated to regain strength and restore function.  Fall precautions are in place. Has f/u with orthopedic surgery. Continue oxyIR 5 mg 1-2 tab q4h prn pain. Continue robaxin 500 mg q8h prn muscle spasm  Palpitations On metoprolol succinate 100 mg daily, change this to 125 mg daily for now and monitor  Leukocytosis Afebrile. Right foot dressing appears to be clean and dry. Monitor cbc with diff  Impaired renal function On lab review from hospital.not clear if has ckd. Monitor renal function  Constipation Stable, continue colace 100 mg bid and senna bid  Leg edema Continue lasix 20 mg daily as needed, leg elevation at rest  HTN Continue benicar hct 40-25 mg daily and monitor bp  gerd Stable, continue protonix 40 mg daily  Peripheral neuropathy Stable, continue lyrica 150 mg tid  Dm type 2 Monitor cbg. Continue metformin xr 500 mg daily  Gout No recent flare, continue allopurinol 300 mg daily  Insomnia Continue trazodone 150 mg at bedtime   Goals of care: short term rehabilitation   Labs/tests ordered:cbc with diff, cmp in 1 week  Family/ staff Communication: reviewed care plan with patient and nursing supervisor    Blanchie Serve,  MD  [place] [phone] (Monday-Friday 8 am - 5 pm) [phone] (afterhours)

## 2014-11-29 NOTE — Addendum Note (Signed)
Addendum  created 11/29/14 9147 by Laurie Panda, MD   Modules edited: Anesthesia Attestations

## 2014-12-06 DIAGNOSIS — Z4789 Encounter for other orthopedic aftercare: Secondary | ICD-10-CM | POA: Diagnosis not present

## 2014-12-06 DIAGNOSIS — M6701 Short Achilles tendon (acquired), right ankle: Secondary | ICD-10-CM | POA: Diagnosis not present

## 2014-12-08 ENCOUNTER — Encounter (HOSPITAL_COMMUNITY): Payer: Self-pay | Admitting: Orthopedic Surgery

## 2014-12-20 DIAGNOSIS — Z4789 Encounter for other orthopedic aftercare: Secondary | ICD-10-CM | POA: Diagnosis not present

## 2015-01-02 ENCOUNTER — Non-Acute Institutional Stay (SKILLED_NURSING_FACILITY): Payer: Medicare Other | Admitting: Adult Health

## 2015-01-02 ENCOUNTER — Encounter: Payer: Self-pay | Admitting: Adult Health

## 2015-01-02 DIAGNOSIS — E1142 Type 2 diabetes mellitus with diabetic polyneuropathy: Secondary | ICD-10-CM | POA: Diagnosis not present

## 2015-01-02 DIAGNOSIS — K5900 Constipation, unspecified: Secondary | ICD-10-CM

## 2015-01-02 DIAGNOSIS — M1A9XX0 Chronic gout, unspecified, without tophus (tophi): Secondary | ICD-10-CM | POA: Diagnosis not present

## 2015-01-02 DIAGNOSIS — M14671 Charcot's joint, right ankle and foot: Secondary | ICD-10-CM | POA: Diagnosis not present

## 2015-01-02 DIAGNOSIS — K219 Gastro-esophageal reflux disease without esophagitis: Secondary | ICD-10-CM

## 2015-01-02 DIAGNOSIS — G4700 Insomnia, unspecified: Secondary | ICD-10-CM | POA: Diagnosis not present

## 2015-01-02 DIAGNOSIS — R002 Palpitations: Secondary | ICD-10-CM | POA: Diagnosis not present

## 2015-01-02 DIAGNOSIS — R600 Localized edema: Secondary | ICD-10-CM | POA: Diagnosis not present

## 2015-01-02 DIAGNOSIS — G629 Polyneuropathy, unspecified: Secondary | ICD-10-CM | POA: Diagnosis not present

## 2015-01-02 DIAGNOSIS — S91109S Unspecified open wound of unspecified toe(s) without damage to nail, sequela: Secondary | ICD-10-CM

## 2015-01-02 NOTE — Progress Notes (Signed)
Patient ID: Pamela Snyder, female   DOB: October 26, 1945, 69 y.o.   MRN: [id number]   01/02/2015  Facility:  Nursing Home Location:  [place] Room Number: 509-P LEVEL OF CARE:  SNF (31)   Chief Complaint  Patient presents with  . Discharge Note    Charcot Foot S/P right midfoot arthrodesis, palpitations, constipation, leg edema, hypertension, GERD, peripheral neuropathy, diabetes mellitus, toe wound, gout and insomnia    HISTORY OF PRESENT ILLNESS:  This is a 69 year old female who is for discharge home with home health PT for strengthening, home mobility assessment and training; OT for self care, toileting and transfers; and nursing for disease management and wound care. DME:  16" X 18"  (18" wide and 16" deep) wheelchair with elevating leg rests, anti-tippers, wheelchair cushion and 3-in-1 bedside commode. She has been admitted to [facility] on 11/27/14 from [facility] with Charcot Foot S/P right midfoot arthrodesis. She has PMH of hypertension, diabetes mellitus, GERD and arthritis.  Today, she was noted to have open wound on her left 2nd and 3rd toes. No erythema nor foul drainage.  Patient was admitted to this facility for short-term rehabilitation after the patient's recent hospitalization.  Patient has completed SNF rehabilitation and therapy has cleared the patient for discharge.  PAST MEDICAL HISTORY:  Past Medical History  Diagnosis Date  . Hypertension   . GERD (gastroesophageal reflux disease)   . Type II diabetes mellitus   . Thyroid goiter 1986  . Migraine     hx  . Arthritis     "right foot; spine; hands" (11/23/2014)  . Gout   . Charcot foot due to diabetes mellitus     CURRENT MEDICATIONS: Reviewed per MAR/see medication list  No Known Allergies   REVIEW OF SYSTEMS:  GENERAL: no change in appetite, no fatigue, no weight changes, no fever, chills or weakness RESPIRATORY: no cough, SOB, DOE, wheezing, hemoptysis CARDIAC:  no chest pain, edema or palpitations GI: no abdominal pain, diarrhea, constipation, heart burn, nausea or vomiting  PHYSICAL EXAMINATION  GENERAL: no acute distress, normal body habitus SKIN:  Left 2nd and 3rd toes have open wounds, no erythema EYES: conjunctivae normal, sclerae normal, normal eye lids NECK: supple, trachea midline, no neck masses, no thyroid tenderness, no thyromegaly LYMPHATICS: no LAN in the neck, no supraclavicular LAN RESPIRATORY: breathing is even & unlabored, BS CTAB CARDIAC: RRR, no murmur,no extra heart sounds, no edema GI: abdomen soft, normal BS, no masses, no tenderness, no hepatomegaly, no splenomegaly EXTREMITIES: able to move X 4 extremities; right foot has short cast and able to wiggle toes freely PSYCHIATRIC: the patient is alert & oriented to person, affect & behavior appropriate  LABS/RADIOLOGY: Labs reviewed: 12/05/14  WBC 10.3 hemoglobin 11.0 hematocrit 33.4 MCV 88.8  Platelet 376 sodium 138 potassium 3.4 glucose 100 BUN 11 creatinine 0.96 total bilirubin 0.3 alkaline phosphatase 126 SGOT 18 SGPT 16 total protein 6.0 albumin 3.4 calcium 8.9 Basic Metabolic Panel:  Recent Labs  11/15/14 1139 11/23/14 1544  NA 134*  --   K 3.9  --   CL 99*  --   CO2 24  --   GLUCOSE 118*  --   BUN 23*  --   CREATININE 1.66* 1.27*  CALCIUM 9.4  --    CBC:  Recent Labs  11/15/14 1139 11/23/14 1544  WBC 16.0* 16.1*  HGB 12.0 11.9*  HCT 37.1 35.7*  MCV 90.9 90.6  PLT 345 275  CBG:  Recent Labs  11/26/14 2151 11/27/14 0745 11/27/14 1143  GLUCAP 154* 108* 148*     ASSESSMENT/PLAN:  Right foot Charcot S/P right midfoot arthrodesis - for home health PT, OT and nursing; follow-up with Dr. Doran Durand, orthopedic surgeon; continue OxyIR 5 mg 1-2 tabs by mouth every 4 hours when necessary for pain; Robaxin 500 mg 1 tab by mouth every 8 hours when necessary for muscle spasm; aspirin 325 mg 1 tab by mouth daily for DVT prophylaxis  Palpitations - continue  Toprol-XL 25 mg 1 tab by mouth daily  Constipation - continue Colace 100 mg by mouth twice a day and senna tab by mouth twice a day  Leg edema - continue Lasix 20 mg 1 tab by mouth daily when necessary  Hypertension - well controlled; continue losartan 50-12.5 mg 1 tab by mouth daily and Toprol-XL 25 mg by mouth daily  GERD - stable; continue Protonix 40 mg 1 tab by mouth daily  Peripheral neuropathy - continue Lyrica 150 mg 1 capsule by mouth 3 times a day  Diabetes mellitus type 2 - continue metformin at 500 mg 1 tab by mouth daily  Gout - continue allopurinol 300 mg 1 tab by mouth daily  Insomnia - continue trazodone 150 mg by mouth daily at bedtime and melatonin 3 mg by mouth daily at bedtime  Left  second and third toe wound - start doxycycline 100 mg 1 tab by mouth twice a day 10 days and Florastor 250 mg 1 capsule by mouth twice a day 13 days; prophylaxis against infection    I have filled out patient's discharge paperwork and written prescriptions.  Patient will receive home health PT, OT and Nursing.  DME provided:  16" X 18"  (18" wide and 16" deep) wheelchair with elevating leg rests, anti-tippers, wheelchair cushion and 3-in-1 bedside commode  Total discharge time: Greater than 30 minutes  Discharge time involved coordination of the discharge process with social worker, nursing staff and therapy department. Medical justification for home health services/DME verified.     [facility], NP [employer] [phone]

## 2015-01-06 DIAGNOSIS — E119 Type 2 diabetes mellitus without complications: Secondary | ICD-10-CM | POA: Diagnosis not present

## 2015-01-06 DIAGNOSIS — M109 Gout, unspecified: Secondary | ICD-10-CM | POA: Diagnosis not present

## 2015-01-06 DIAGNOSIS — G629 Polyneuropathy, unspecified: Secondary | ICD-10-CM | POA: Diagnosis not present

## 2015-01-06 DIAGNOSIS — M159 Polyosteoarthritis, unspecified: Secondary | ICD-10-CM | POA: Diagnosis not present

## 2015-01-06 DIAGNOSIS — I10 Essential (primary) hypertension: Secondary | ICD-10-CM | POA: Diagnosis not present

## 2015-01-06 DIAGNOSIS — Z4789 Encounter for other orthopedic aftercare: Secondary | ICD-10-CM | POA: Diagnosis not present

## 2015-01-10 DIAGNOSIS — M109 Gout, unspecified: Secondary | ICD-10-CM | POA: Diagnosis not present

## 2015-01-10 DIAGNOSIS — M159 Polyosteoarthritis, unspecified: Secondary | ICD-10-CM | POA: Diagnosis not present

## 2015-01-10 DIAGNOSIS — I10 Essential (primary) hypertension: Secondary | ICD-10-CM | POA: Diagnosis not present

## 2015-01-10 DIAGNOSIS — G629 Polyneuropathy, unspecified: Secondary | ICD-10-CM | POA: Diagnosis not present

## 2015-01-10 DIAGNOSIS — Z4789 Encounter for other orthopedic aftercare: Secondary | ICD-10-CM | POA: Diagnosis not present

## 2015-01-10 DIAGNOSIS — E119 Type 2 diabetes mellitus without complications: Secondary | ICD-10-CM | POA: Diagnosis not present

## 2015-01-12 DIAGNOSIS — E119 Type 2 diabetes mellitus without complications: Secondary | ICD-10-CM | POA: Diagnosis not present

## 2015-01-12 DIAGNOSIS — M159 Polyosteoarthritis, unspecified: Secondary | ICD-10-CM | POA: Diagnosis not present

## 2015-01-12 DIAGNOSIS — I10 Essential (primary) hypertension: Secondary | ICD-10-CM | POA: Diagnosis not present

## 2015-01-12 DIAGNOSIS — G629 Polyneuropathy, unspecified: Secondary | ICD-10-CM | POA: Diagnosis not present

## 2015-01-12 DIAGNOSIS — Z4789 Encounter for other orthopedic aftercare: Secondary | ICD-10-CM | POA: Diagnosis not present

## 2015-01-12 DIAGNOSIS — M109 Gout, unspecified: Secondary | ICD-10-CM | POA: Diagnosis not present

## 2015-01-15 DIAGNOSIS — I10 Essential (primary) hypertension: Secondary | ICD-10-CM | POA: Diagnosis not present

## 2015-01-15 DIAGNOSIS — Z4789 Encounter for other orthopedic aftercare: Secondary | ICD-10-CM | POA: Diagnosis not present

## 2015-01-15 DIAGNOSIS — G629 Polyneuropathy, unspecified: Secondary | ICD-10-CM | POA: Diagnosis not present

## 2015-01-15 DIAGNOSIS — M159 Polyosteoarthritis, unspecified: Secondary | ICD-10-CM | POA: Diagnosis not present

## 2015-01-15 DIAGNOSIS — E119 Type 2 diabetes mellitus without complications: Secondary | ICD-10-CM | POA: Diagnosis not present

## 2015-01-15 DIAGNOSIS — M109 Gout, unspecified: Secondary | ICD-10-CM | POA: Diagnosis not present

## 2015-01-16 DIAGNOSIS — E119 Type 2 diabetes mellitus without complications: Secondary | ICD-10-CM | POA: Diagnosis not present

## 2015-01-16 DIAGNOSIS — G629 Polyneuropathy, unspecified: Secondary | ICD-10-CM | POA: Diagnosis not present

## 2015-01-16 DIAGNOSIS — M109 Gout, unspecified: Secondary | ICD-10-CM | POA: Diagnosis not present

## 2015-01-16 DIAGNOSIS — I10 Essential (primary) hypertension: Secondary | ICD-10-CM | POA: Diagnosis not present

## 2015-01-16 DIAGNOSIS — Z4789 Encounter for other orthopedic aftercare: Secondary | ICD-10-CM | POA: Diagnosis not present

## 2015-01-16 DIAGNOSIS — M159 Polyosteoarthritis, unspecified: Secondary | ICD-10-CM | POA: Diagnosis not present

## 2015-01-17 DIAGNOSIS — Z4789 Encounter for other orthopedic aftercare: Secondary | ICD-10-CM | POA: Diagnosis not present

## 2015-01-18 DIAGNOSIS — G629 Polyneuropathy, unspecified: Secondary | ICD-10-CM | POA: Diagnosis not present

## 2015-01-18 DIAGNOSIS — Z4789 Encounter for other orthopedic aftercare: Secondary | ICD-10-CM | POA: Diagnosis not present

## 2015-01-18 DIAGNOSIS — E119 Type 2 diabetes mellitus without complications: Secondary | ICD-10-CM | POA: Diagnosis not present

## 2015-01-18 DIAGNOSIS — M159 Polyosteoarthritis, unspecified: Secondary | ICD-10-CM | POA: Diagnosis not present

## 2015-01-18 DIAGNOSIS — M109 Gout, unspecified: Secondary | ICD-10-CM | POA: Diagnosis not present

## 2015-01-18 DIAGNOSIS — I10 Essential (primary) hypertension: Secondary | ICD-10-CM | POA: Diagnosis not present

## 2015-01-19 DIAGNOSIS — Z4789 Encounter for other orthopedic aftercare: Secondary | ICD-10-CM | POA: Diagnosis not present

## 2015-01-19 DIAGNOSIS — E119 Type 2 diabetes mellitus without complications: Secondary | ICD-10-CM | POA: Diagnosis not present

## 2015-01-19 DIAGNOSIS — G629 Polyneuropathy, unspecified: Secondary | ICD-10-CM | POA: Diagnosis not present

## 2015-01-19 DIAGNOSIS — M109 Gout, unspecified: Secondary | ICD-10-CM | POA: Diagnosis not present

## 2015-01-19 DIAGNOSIS — I10 Essential (primary) hypertension: Secondary | ICD-10-CM | POA: Diagnosis not present

## 2015-01-19 DIAGNOSIS — M159 Polyosteoarthritis, unspecified: Secondary | ICD-10-CM | POA: Diagnosis not present

## 2015-01-22 DIAGNOSIS — M159 Polyosteoarthritis, unspecified: Secondary | ICD-10-CM | POA: Diagnosis not present

## 2015-01-22 DIAGNOSIS — E119 Type 2 diabetes mellitus without complications: Secondary | ICD-10-CM | POA: Diagnosis not present

## 2015-01-22 DIAGNOSIS — Z4789 Encounter for other orthopedic aftercare: Secondary | ICD-10-CM | POA: Diagnosis not present

## 2015-01-22 DIAGNOSIS — M109 Gout, unspecified: Secondary | ICD-10-CM | POA: Diagnosis not present

## 2015-01-22 DIAGNOSIS — I10 Essential (primary) hypertension: Secondary | ICD-10-CM | POA: Diagnosis not present

## 2015-01-22 DIAGNOSIS — G629 Polyneuropathy, unspecified: Secondary | ICD-10-CM | POA: Diagnosis not present

## 2015-01-26 DIAGNOSIS — M159 Polyosteoarthritis, unspecified: Secondary | ICD-10-CM | POA: Diagnosis not present

## 2015-01-26 DIAGNOSIS — Z9889 Other specified postprocedural states: Secondary | ICD-10-CM | POA: Diagnosis not present

## 2015-01-26 DIAGNOSIS — M109 Gout, unspecified: Secondary | ICD-10-CM | POA: Diagnosis not present

## 2015-01-26 DIAGNOSIS — E119 Type 2 diabetes mellitus without complications: Secondary | ICD-10-CM | POA: Diagnosis not present

## 2015-01-26 DIAGNOSIS — Z4789 Encounter for other orthopedic aftercare: Secondary | ICD-10-CM | POA: Diagnosis not present

## 2015-01-26 DIAGNOSIS — G629 Polyneuropathy, unspecified: Secondary | ICD-10-CM | POA: Diagnosis not present

## 2015-01-26 DIAGNOSIS — I10 Essential (primary) hypertension: Secondary | ICD-10-CM | POA: Diagnosis not present

## 2015-01-29 DIAGNOSIS — M159 Polyosteoarthritis, unspecified: Secondary | ICD-10-CM | POA: Diagnosis not present

## 2015-01-29 DIAGNOSIS — M109 Gout, unspecified: Secondary | ICD-10-CM | POA: Diagnosis not present

## 2015-01-29 DIAGNOSIS — E119 Type 2 diabetes mellitus without complications: Secondary | ICD-10-CM | POA: Diagnosis not present

## 2015-01-29 DIAGNOSIS — Z4789 Encounter for other orthopedic aftercare: Secondary | ICD-10-CM | POA: Diagnosis not present

## 2015-01-29 DIAGNOSIS — G629 Polyneuropathy, unspecified: Secondary | ICD-10-CM | POA: Diagnosis not present

## 2015-01-29 DIAGNOSIS — I10 Essential (primary) hypertension: Secondary | ICD-10-CM | POA: Diagnosis not present

## 2015-02-02 DIAGNOSIS — E119 Type 2 diabetes mellitus without complications: Secondary | ICD-10-CM | POA: Diagnosis not present

## 2015-02-02 DIAGNOSIS — M159 Polyosteoarthritis, unspecified: Secondary | ICD-10-CM | POA: Diagnosis not present

## 2015-02-02 DIAGNOSIS — Z4789 Encounter for other orthopedic aftercare: Secondary | ICD-10-CM | POA: Diagnosis not present

## 2015-02-02 DIAGNOSIS — I10 Essential (primary) hypertension: Secondary | ICD-10-CM | POA: Diagnosis not present

## 2015-02-02 DIAGNOSIS — M109 Gout, unspecified: Secondary | ICD-10-CM | POA: Diagnosis not present

## 2015-02-02 DIAGNOSIS — G629 Polyneuropathy, unspecified: Secondary | ICD-10-CM | POA: Diagnosis not present

## 2015-02-05 DIAGNOSIS — M109 Gout, unspecified: Secondary | ICD-10-CM | POA: Diagnosis not present

## 2015-02-05 DIAGNOSIS — G629 Polyneuropathy, unspecified: Secondary | ICD-10-CM | POA: Diagnosis not present

## 2015-02-05 DIAGNOSIS — M159 Polyosteoarthritis, unspecified: Secondary | ICD-10-CM | POA: Diagnosis not present

## 2015-02-05 DIAGNOSIS — I10 Essential (primary) hypertension: Secondary | ICD-10-CM | POA: Diagnosis not present

## 2015-02-05 DIAGNOSIS — E119 Type 2 diabetes mellitus without complications: Secondary | ICD-10-CM | POA: Diagnosis not present

## 2015-02-05 DIAGNOSIS — Z4789 Encounter for other orthopedic aftercare: Secondary | ICD-10-CM | POA: Diagnosis not present

## 2015-02-07 DIAGNOSIS — M159 Polyosteoarthritis, unspecified: Secondary | ICD-10-CM | POA: Diagnosis not present

## 2015-02-07 DIAGNOSIS — G629 Polyneuropathy, unspecified: Secondary | ICD-10-CM | POA: Diagnosis not present

## 2015-02-07 DIAGNOSIS — Z4789 Encounter for other orthopedic aftercare: Secondary | ICD-10-CM | POA: Diagnosis not present

## 2015-02-07 DIAGNOSIS — M109 Gout, unspecified: Secondary | ICD-10-CM | POA: Diagnosis not present

## 2015-02-07 DIAGNOSIS — E119 Type 2 diabetes mellitus without complications: Secondary | ICD-10-CM | POA: Diagnosis not present

## 2015-02-07 DIAGNOSIS — I10 Essential (primary) hypertension: Secondary | ICD-10-CM | POA: Diagnosis not present

## 2015-02-12 DIAGNOSIS — E119 Type 2 diabetes mellitus without complications: Secondary | ICD-10-CM | POA: Diagnosis not present

## 2015-02-12 DIAGNOSIS — G629 Polyneuropathy, unspecified: Secondary | ICD-10-CM | POA: Diagnosis not present

## 2015-02-12 DIAGNOSIS — I10 Essential (primary) hypertension: Secondary | ICD-10-CM | POA: Diagnosis not present

## 2015-02-12 DIAGNOSIS — M159 Polyosteoarthritis, unspecified: Secondary | ICD-10-CM | POA: Diagnosis not present

## 2015-02-12 DIAGNOSIS — Z4789 Encounter for other orthopedic aftercare: Secondary | ICD-10-CM | POA: Diagnosis not present

## 2015-02-12 DIAGNOSIS — M109 Gout, unspecified: Secondary | ICD-10-CM | POA: Diagnosis not present

## 2015-02-14 DIAGNOSIS — M6701 Short Achilles tendon (acquired), right ankle: Secondary | ICD-10-CM | POA: Diagnosis not present

## 2015-02-14 DIAGNOSIS — Z4789 Encounter for other orthopedic aftercare: Secondary | ICD-10-CM | POA: Diagnosis not present

## 2015-02-16 DIAGNOSIS — M109 Gout, unspecified: Secondary | ICD-10-CM | POA: Diagnosis not present

## 2015-02-16 DIAGNOSIS — Z4789 Encounter for other orthopedic aftercare: Secondary | ICD-10-CM | POA: Diagnosis not present

## 2015-02-16 DIAGNOSIS — G629 Polyneuropathy, unspecified: Secondary | ICD-10-CM | POA: Diagnosis not present

## 2015-02-16 DIAGNOSIS — I10 Essential (primary) hypertension: Secondary | ICD-10-CM | POA: Diagnosis not present

## 2015-02-16 DIAGNOSIS — E119 Type 2 diabetes mellitus without complications: Secondary | ICD-10-CM | POA: Diagnosis not present

## 2015-02-16 DIAGNOSIS — M159 Polyosteoarthritis, unspecified: Secondary | ICD-10-CM | POA: Diagnosis not present

## 2015-02-19 DIAGNOSIS — M159 Polyosteoarthritis, unspecified: Secondary | ICD-10-CM | POA: Diagnosis not present

## 2015-02-19 DIAGNOSIS — G629 Polyneuropathy, unspecified: Secondary | ICD-10-CM | POA: Diagnosis not present

## 2015-02-19 DIAGNOSIS — E119 Type 2 diabetes mellitus without complications: Secondary | ICD-10-CM | POA: Diagnosis not present

## 2015-02-19 DIAGNOSIS — M109 Gout, unspecified: Secondary | ICD-10-CM | POA: Diagnosis not present

## 2015-02-19 DIAGNOSIS — I10 Essential (primary) hypertension: Secondary | ICD-10-CM | POA: Diagnosis not present

## 2015-02-19 DIAGNOSIS — Z4789 Encounter for other orthopedic aftercare: Secondary | ICD-10-CM | POA: Diagnosis not present

## 2015-02-24 DIAGNOSIS — G629 Polyneuropathy, unspecified: Secondary | ICD-10-CM | POA: Diagnosis not present

## 2015-02-24 DIAGNOSIS — Z4789 Encounter for other orthopedic aftercare: Secondary | ICD-10-CM | POA: Diagnosis not present

## 2015-02-24 DIAGNOSIS — E119 Type 2 diabetes mellitus without complications: Secondary | ICD-10-CM | POA: Diagnosis not present

## 2015-02-24 DIAGNOSIS — M109 Gout, unspecified: Secondary | ICD-10-CM | POA: Diagnosis not present

## 2015-02-24 DIAGNOSIS — I10 Essential (primary) hypertension: Secondary | ICD-10-CM | POA: Diagnosis not present

## 2015-02-24 DIAGNOSIS — M159 Polyosteoarthritis, unspecified: Secondary | ICD-10-CM | POA: Diagnosis not present

## 2015-02-27 DIAGNOSIS — G629 Polyneuropathy, unspecified: Secondary | ICD-10-CM | POA: Diagnosis not present

## 2015-02-27 DIAGNOSIS — M109 Gout, unspecified: Secondary | ICD-10-CM | POA: Diagnosis not present

## 2015-02-27 DIAGNOSIS — M159 Polyosteoarthritis, unspecified: Secondary | ICD-10-CM | POA: Diagnosis not present

## 2015-02-27 DIAGNOSIS — E119 Type 2 diabetes mellitus without complications: Secondary | ICD-10-CM | POA: Diagnosis not present

## 2015-02-27 DIAGNOSIS — I10 Essential (primary) hypertension: Secondary | ICD-10-CM | POA: Diagnosis not present

## 2015-02-27 DIAGNOSIS — Z4789 Encounter for other orthopedic aftercare: Secondary | ICD-10-CM | POA: Diagnosis not present

## 2015-03-02 DIAGNOSIS — I10 Essential (primary) hypertension: Secondary | ICD-10-CM | POA: Diagnosis not present

## 2015-03-02 DIAGNOSIS — M159 Polyosteoarthritis, unspecified: Secondary | ICD-10-CM | POA: Diagnosis not present

## 2015-03-02 DIAGNOSIS — G629 Polyneuropathy, unspecified: Secondary | ICD-10-CM | POA: Diagnosis not present

## 2015-03-02 DIAGNOSIS — M109 Gout, unspecified: Secondary | ICD-10-CM | POA: Diagnosis not present

## 2015-03-02 DIAGNOSIS — Z4789 Encounter for other orthopedic aftercare: Secondary | ICD-10-CM | POA: Diagnosis not present

## 2015-03-02 DIAGNOSIS — E119 Type 2 diabetes mellitus without complications: Secondary | ICD-10-CM | POA: Diagnosis not present

## 2015-03-16 DIAGNOSIS — M14671 Charcot's joint, right ankle and foot: Secondary | ICD-10-CM | POA: Diagnosis not present

## 2015-04-13 DIAGNOSIS — M6701 Short Achilles tendon (acquired), right ankle: Secondary | ICD-10-CM | POA: Diagnosis not present

## 2015-04-13 DIAGNOSIS — M14671 Charcot's joint, right ankle and foot: Secondary | ICD-10-CM | POA: Diagnosis not present

## 2015-04-13 DIAGNOSIS — E1142 Type 2 diabetes mellitus with diabetic polyneuropathy: Secondary | ICD-10-CM | POA: Diagnosis not present

## 2015-04-19 ENCOUNTER — Encounter (HOSPITAL_BASED_OUTPATIENT_CLINIC_OR_DEPARTMENT_OTHER): Payer: Medicare Other

## 2015-04-19 DIAGNOSIS — E1165 Type 2 diabetes mellitus with hyperglycemia: Secondary | ICD-10-CM | POA: Diagnosis not present

## 2015-04-19 DIAGNOSIS — N390 Urinary tract infection, site not specified: Secondary | ICD-10-CM | POA: Diagnosis not present

## 2015-04-19 DIAGNOSIS — E789 Disorder of lipoprotein metabolism, unspecified: Secondary | ICD-10-CM | POA: Diagnosis not present

## 2015-04-19 DIAGNOSIS — I10 Essential (primary) hypertension: Secondary | ICD-10-CM | POA: Diagnosis not present

## 2015-04-26 DIAGNOSIS — E1122 Type 2 diabetes mellitus with diabetic chronic kidney disease: Secondary | ICD-10-CM | POA: Diagnosis not present

## 2015-04-26 DIAGNOSIS — I129 Hypertensive chronic kidney disease with stage 1 through stage 4 chronic kidney disease, or unspecified chronic kidney disease: Secondary | ICD-10-CM | POA: Diagnosis not present

## 2015-04-26 DIAGNOSIS — N183 Chronic kidney disease, stage 3 (moderate): Secondary | ICD-10-CM | POA: Diagnosis not present

## 2015-04-26 DIAGNOSIS — E785 Hyperlipidemia, unspecified: Secondary | ICD-10-CM | POA: Diagnosis not present

## 2015-04-26 DIAGNOSIS — Z23 Encounter for immunization: Secondary | ICD-10-CM | POA: Diagnosis not present

## 2015-05-19 ENCOUNTER — Other Ambulatory Visit: Payer: Self-pay | Admitting: Adult Health

## 2015-06-09 ENCOUNTER — Other Ambulatory Visit: Payer: Self-pay | Admitting: Adult Health

## 2015-08-20 DIAGNOSIS — E039 Hypothyroidism, unspecified: Secondary | ICD-10-CM | POA: Diagnosis not present

## 2015-08-20 DIAGNOSIS — E1122 Type 2 diabetes mellitus with diabetic chronic kidney disease: Secondary | ICD-10-CM | POA: Diagnosis not present

## 2015-08-20 DIAGNOSIS — M109 Gout, unspecified: Secondary | ICD-10-CM | POA: Diagnosis not present

## 2015-08-20 DIAGNOSIS — M8580 Other specified disorders of bone density and structure, unspecified site: Secondary | ICD-10-CM | POA: Diagnosis not present

## 2015-08-20 DIAGNOSIS — I129 Hypertensive chronic kidney disease with stage 1 through stage 4 chronic kidney disease, or unspecified chronic kidney disease: Secondary | ICD-10-CM | POA: Diagnosis not present

## 2015-08-20 DIAGNOSIS — E559 Vitamin D deficiency, unspecified: Secondary | ICD-10-CM | POA: Diagnosis not present

## 2015-08-27 DIAGNOSIS — Z0001 Encounter for general adult medical examination with abnormal findings: Secondary | ICD-10-CM | POA: Diagnosis not present

## 2015-08-27 DIAGNOSIS — E785 Hyperlipidemia, unspecified: Secondary | ICD-10-CM | POA: Diagnosis not present

## 2015-08-27 DIAGNOSIS — I129 Hypertensive chronic kidney disease with stage 1 through stage 4 chronic kidney disease, or unspecified chronic kidney disease: Secondary | ICD-10-CM | POA: Diagnosis not present

## 2015-08-27 DIAGNOSIS — E1122 Type 2 diabetes mellitus with diabetic chronic kidney disease: Secondary | ICD-10-CM | POA: Diagnosis not present

## 2015-08-27 DIAGNOSIS — N183 Chronic kidney disease, stage 3 (moderate): Secondary | ICD-10-CM | POA: Diagnosis not present

## 2015-09-21 DIAGNOSIS — M14671 Charcot's joint, right ankle and foot: Secondary | ICD-10-CM | POA: Diagnosis not present

## 2015-10-02 DIAGNOSIS — M14671 Charcot's joint, right ankle and foot: Secondary | ICD-10-CM | POA: Diagnosis not present

## 2015-10-05 DIAGNOSIS — M14671 Charcot's joint, right ankle and foot: Secondary | ICD-10-CM | POA: Diagnosis not present

## 2015-10-09 DIAGNOSIS — M14671 Charcot's joint, right ankle and foot: Secondary | ICD-10-CM | POA: Diagnosis not present

## 2015-10-15 DIAGNOSIS — M14671 Charcot's joint, right ankle and foot: Secondary | ICD-10-CM | POA: Diagnosis not present

## 2015-10-17 DIAGNOSIS — M14671 Charcot's joint, right ankle and foot: Secondary | ICD-10-CM | POA: Diagnosis not present

## 2015-10-23 DIAGNOSIS — M14671 Charcot's joint, right ankle and foot: Secondary | ICD-10-CM | POA: Diagnosis not present

## 2015-10-26 DIAGNOSIS — M14671 Charcot's joint, right ankle and foot: Secondary | ICD-10-CM | POA: Diagnosis not present

## 2015-10-30 DIAGNOSIS — M14671 Charcot's joint, right ankle and foot: Secondary | ICD-10-CM | POA: Diagnosis not present

## 2015-11-02 DIAGNOSIS — M14671 Charcot's joint, right ankle and foot: Secondary | ICD-10-CM | POA: Diagnosis not present

## 2015-11-06 DIAGNOSIS — M14671 Charcot's joint, right ankle and foot: Secondary | ICD-10-CM | POA: Diagnosis not present

## 2015-11-09 DIAGNOSIS — M14671 Charcot's joint, right ankle and foot: Secondary | ICD-10-CM | POA: Diagnosis not present

## 2015-11-15 DIAGNOSIS — M14671 Charcot's joint, right ankle and foot: Secondary | ICD-10-CM | POA: Diagnosis not present

## 2015-12-06 DIAGNOSIS — M14671 Charcot's joint, right ankle and foot: Secondary | ICD-10-CM | POA: Diagnosis not present

## 2015-12-14 DIAGNOSIS — E1142 Type 2 diabetes mellitus with diabetic polyneuropathy: Secondary | ICD-10-CM | POA: Diagnosis not present

## 2015-12-14 DIAGNOSIS — M14671 Charcot's joint, right ankle and foot: Secondary | ICD-10-CM | POA: Diagnosis not present

## 2016-01-29 DIAGNOSIS — E1143 Type 2 diabetes mellitus with diabetic autonomic (poly)neuropathy: Secondary | ICD-10-CM | POA: Diagnosis not present

## 2016-01-29 DIAGNOSIS — I10 Essential (primary) hypertension: Secondary | ICD-10-CM | POA: Diagnosis not present

## 2016-01-29 DIAGNOSIS — E559 Vitamin D deficiency, unspecified: Secondary | ICD-10-CM | POA: Diagnosis not present

## 2016-01-29 DIAGNOSIS — R8290 Unspecified abnormal findings in urine: Secondary | ICD-10-CM | POA: Diagnosis not present

## 2016-01-29 DIAGNOSIS — Z119 Encounter for screening for infectious and parasitic diseases, unspecified: Secondary | ICD-10-CM | POA: Diagnosis not present

## 2016-01-29 DIAGNOSIS — Z7984 Long term (current) use of oral hypoglycemic drugs: Secondary | ICD-10-CM | POA: Diagnosis not present

## 2016-01-29 DIAGNOSIS — S39012A Strain of muscle, fascia and tendon of lower back, initial encounter: Secondary | ICD-10-CM | POA: Diagnosis not present

## 2016-02-04 ENCOUNTER — Other Ambulatory Visit: Payer: Self-pay | Admitting: Internal Medicine

## 2016-02-04 ENCOUNTER — Ambulatory Visit
Admission: RE | Admit: 2016-02-04 | Discharge: 2016-02-04 | Disposition: A | Discharge status: Home / Self Care | Payer: Medicare Other | Source: Ambulatory Visit | Attending: Internal Medicine | Admitting: Internal Medicine

## 2016-02-04 DIAGNOSIS — R8290 Unspecified abnormal findings in urine: Secondary | ICD-10-CM | POA: Diagnosis not present

## 2016-02-04 DIAGNOSIS — R7989 Other specified abnormal findings of blood chemistry: Secondary | ICD-10-CM

## 2016-02-04 DIAGNOSIS — N184 Chronic kidney disease, stage 4 (severe): Secondary | ICD-10-CM | POA: Diagnosis not present

## 2016-02-04 DIAGNOSIS — R935 Abnormal findings on diagnostic imaging of other abdominal regions, including retroperitoneum: Secondary | ICD-10-CM | POA: Diagnosis not present

## 2016-02-04 DIAGNOSIS — E1143 Type 2 diabetes mellitus with diabetic autonomic (poly)neuropathy: Secondary | ICD-10-CM | POA: Diagnosis not present

## 2016-02-04 DIAGNOSIS — Z7984 Long term (current) use of oral hypoglycemic drugs: Secondary | ICD-10-CM | POA: Diagnosis not present

## 2016-03-17 DIAGNOSIS — Z8739 Personal history of other diseases of the musculoskeletal system and connective tissue: Secondary | ICD-10-CM | POA: Diagnosis not present

## 2016-03-17 DIAGNOSIS — E1143 Type 2 diabetes mellitus with diabetic autonomic (poly)neuropathy: Secondary | ICD-10-CM | POA: Diagnosis not present

## 2016-03-17 DIAGNOSIS — N183 Chronic kidney disease, stage 3 (moderate): Secondary | ICD-10-CM | POA: Diagnosis not present

## 2016-03-17 DIAGNOSIS — I10 Essential (primary) hypertension: Secondary | ICD-10-CM | POA: Diagnosis not present

## 2016-03-17 DIAGNOSIS — Z7984 Long term (current) use of oral hypoglycemic drugs: Secondary | ICD-10-CM | POA: Diagnosis not present

## 2016-03-17 DIAGNOSIS — E1165 Type 2 diabetes mellitus with hyperglycemia: Secondary | ICD-10-CM | POA: Diagnosis not present

## 2016-04-22 ENCOUNTER — Other Ambulatory Visit: Payer: Self-pay | Admitting: Internal Medicine

## 2016-04-22 DIAGNOSIS — R921 Mammographic calcification found on diagnostic imaging of breast: Secondary | ICD-10-CM

## 2016-04-30 ENCOUNTER — Ambulatory Visit
Admission: RE | Admit: 2016-04-30 | Discharge: 2016-04-30 | Disposition: A | Discharge status: Home / Self Care | Payer: Medicare Other | Source: Ambulatory Visit | Attending: Internal Medicine | Admitting: Internal Medicine

## 2016-04-30 DIAGNOSIS — R921 Mammographic calcification found on diagnostic imaging of breast: Secondary | ICD-10-CM

## 2016-06-19 DIAGNOSIS — L600 Ingrowing nail: Secondary | ICD-10-CM | POA: Diagnosis not present

## 2016-06-19 DIAGNOSIS — E1143 Type 2 diabetes mellitus with diabetic autonomic (poly)neuropathy: Secondary | ICD-10-CM | POA: Diagnosis not present

## 2016-06-19 DIAGNOSIS — E11610 Type 2 diabetes mellitus with diabetic neuropathic arthropathy: Secondary | ICD-10-CM | POA: Diagnosis not present

## 2016-06-19 DIAGNOSIS — Z7984 Long term (current) use of oral hypoglycemic drugs: Secondary | ICD-10-CM | POA: Diagnosis not present

## 2016-06-19 DIAGNOSIS — E790 Hyperuricemia without signs of inflammatory arthritis and tophaceous disease: Secondary | ICD-10-CM | POA: Diagnosis not present

## 2016-06-19 DIAGNOSIS — I10 Essential (primary) hypertension: Secondary | ICD-10-CM | POA: Diagnosis not present

## 2016-06-19 DIAGNOSIS — N183 Chronic kidney disease, stage 3 (moderate): Secondary | ICD-10-CM | POA: Diagnosis not present

## 2016-07-17 DIAGNOSIS — Z0001 Encounter for general adult medical examination with abnormal findings: Secondary | ICD-10-CM | POA: Diagnosis not present

## 2016-07-17 DIAGNOSIS — E1143 Type 2 diabetes mellitus with diabetic autonomic (poly)neuropathy: Secondary | ICD-10-CM | POA: Diagnosis not present

## 2016-07-17 DIAGNOSIS — D6489 Other specified anemias: Secondary | ICD-10-CM | POA: Diagnosis not present

## 2016-07-17 DIAGNOSIS — Z1389 Encounter for screening for other disorder: Secondary | ICD-10-CM | POA: Diagnosis not present

## 2016-07-17 DIAGNOSIS — E7800 Pure hypercholesterolemia, unspecified: Secondary | ICD-10-CM | POA: Diagnosis not present

## 2016-07-17 DIAGNOSIS — Z7984 Long term (current) use of oral hypoglycemic drugs: Secondary | ICD-10-CM | POA: Diagnosis not present

## 2016-08-01 DIAGNOSIS — N952 Postmenopausal atrophic vaginitis: Secondary | ICD-10-CM | POA: Diagnosis not present

## 2016-08-01 DIAGNOSIS — E669 Obesity, unspecified: Secondary | ICD-10-CM | POA: Diagnosis not present

## 2016-08-01 DIAGNOSIS — N951 Menopausal and female climacteric states: Secondary | ICD-10-CM | POA: Diagnosis not present

## 2016-08-01 DIAGNOSIS — Z6838 Body mass index (BMI) 38.0-38.9, adult: Secondary | ICD-10-CM | POA: Diagnosis not present

## 2016-08-14 DIAGNOSIS — E669 Obesity, unspecified: Secondary | ICD-10-CM | POA: Diagnosis not present

## 2016-08-14 DIAGNOSIS — E790 Hyperuricemia without signs of inflammatory arthritis and tophaceous disease: Secondary | ICD-10-CM | POA: Diagnosis not present

## 2016-08-14 DIAGNOSIS — I7381 Erythromelalgia: Secondary | ICD-10-CM | POA: Diagnosis not present

## 2016-08-14 DIAGNOSIS — Z6838 Body mass index (BMI) 38.0-38.9, adult: Secondary | ICD-10-CM | POA: Diagnosis not present

## 2016-08-26 ENCOUNTER — Ambulatory Visit (INDEPENDENT_AMBULATORY_CARE_PROVIDER_SITE_OTHER): Payer: Medicare Other | Admitting: Sports Medicine

## 2016-08-26 ENCOUNTER — Telehealth: Payer: Self-pay | Admitting: *Deleted

## 2016-08-26 ENCOUNTER — Encounter: Payer: Self-pay | Admitting: Sports Medicine

## 2016-08-26 ENCOUNTER — Ambulatory Visit (INDEPENDENT_AMBULATORY_CARE_PROVIDER_SITE_OTHER): Payer: Medicare Other

## 2016-08-26 DIAGNOSIS — E1142 Type 2 diabetes mellitus with diabetic polyneuropathy: Secondary | ICD-10-CM

## 2016-08-26 DIAGNOSIS — M79671 Pain in right foot: Secondary | ICD-10-CM | POA: Diagnosis not present

## 2016-08-26 DIAGNOSIS — M799 Soft tissue disorder, unspecified: Secondary | ICD-10-CM | POA: Diagnosis not present

## 2016-08-26 DIAGNOSIS — M7989 Other specified soft tissue disorders: Secondary | ICD-10-CM

## 2016-08-26 DIAGNOSIS — E11610 Type 2 diabetes mellitus with diabetic neuropathic arthropathy: Secondary | ICD-10-CM

## 2016-08-26 NOTE — Telephone Encounter (Addendum)
-----   Message from Landis Martins, [place] sent at 08/26/2016  2:15 PM EST ----- Regarding: Open MRI Right foot Soft tissue mass right hallux. Orders faxed to [facility].

## 2016-08-26 NOTE — Progress Notes (Signed)
Subjective: Pamela Snyder is a 71 y.o. female patient with history of diabetes who presents to office today complaining of pain underneath right 1st toe and continued neuropathy with redness to soles and hands. Patient states that the glucose reading this morning was "good". Patient denies any new changes in medication or new problems. Patient denies any new cramping, numbness, burning or tingling in the legs on neuropathy medication and reports that her metformin has been changed due to kidney issues.  Patient Active Problem List   Diagnosis Date Noted  . Charcot foot due to diabetes mellitus ([place]) 11/23/2014   Current Outpatient Prescriptions on File Prior to Visit  Medication Sig Dispense Refill  . acetaminophen (TYLENOL) 325 MG tablet Take 2 tablets (650 mg total) by mouth every 6 (six) hours as needed for mild pain or moderate pain (or Fever >/= 101).    [name] allopurinol (ZYLOPRIM) 300 MG tablet Take 300 mg by mouth daily.    [name] aspirin EC 325 MG EC tablet Take 1 tablet (325 mg total) by mouth daily. 42 tablet 0  . B Complex-C (SUPER B COMPLEX PO) Take 1 tablet by mouth daily.    [name] BIOTIN PO Take 1 tablet by mouth daily.    [name] CALCIUM-MAGNESIUM-ZINC PO Take 1 tablet by mouth daily.    . Cholecalciferol 1000 UNITS capsule Take 1,000 Units by mouth daily.    [name] docusate sodium (COLACE) 100 MG capsule Take 1 capsule (100 mg total) by mouth 2 (two) times daily. 30 capsule 0  . folic acid (FOLVITE) 1 MG tablet Take 1 mg by mouth daily.    . furosemide (LASIX) 20 MG tablet Take 20 mg by mouth daily as needed for fluid.    . Ginkgo Biloba 40 MG TABS Take 1 tablet by mouth daily.    . metFORMIN (GLUCOPHAGE-XR) 500 MG 24 hr tablet Take 1 tablet by mouth daily.  4  . methocarbamol (ROBAXIN) 500 MG tablet Take 1 tablet (500 mg total) by mouth every 6 (six) hours as needed for muscle spasms. 20 tablet 0  . metoprolol succinate (TOPROL-XL) 100 MG 24 hr tablet Take 100 mg by mouth daily. Take with or  immediately following a meal.    . Multiple Vitamins-Minerals (MULTIVITAMIN WITH MINERALS) tablet Take 1 tablet by mouth daily.    [name] olmesartan-hydrochlorothiazide (BENICAR HCT) 40-25 MG per tablet Take 0.5 tablets by mouth daily.    [name] oxyCODONE (OXY IR/ROXICODONE) 5 MG immediate release tablet Take 1-2 tablets (5-10 mg total) by mouth every 4 (four) hours as needed for severe pain or breakthrough pain. 30 tablet 0  . pantoprazole (PROTONIX) 40 MG tablet Take 40 mg by mouth daily.    . potassium chloride SA (K-DUR,KLOR-CON) 20 MEQ tablet Take 20 mEq by mouth daily as needed.    . pregabalin (LYRICA) 150 MG capsule Take 150 mg by mouth 3 (three) times daily.    [name] senna (SENOKOT) 8.6 MG TABS tablet Take 1 tablet (8.6 mg total) by mouth 2 (two) times daily. 60 each 0  . traZODone (DESYREL) 50 MG tablet Take 150 mg by mouth at bedtime as needed.      No current facility-administered medications on file prior to visit.    No Known Allergies  No results found for this or any previous visit (from the past 2160 hour(s)).  Objective: General: Patient is awake, alert, and oriented x 3 and in no acute distress.  Integument: Skin is warm, dry and supple bilateral. Nails  are short and mildy thickened and dystrophic with subungual debris, consistent with onychomycosis, 1-5 bilateral. + soft tissue mass at plantar right hallux that is nonpainful to touch and nonpulsatile. No signs of infection. No open lesions or preulcerative lesions present bilateral. Remaining integument unremarkable.  Vasculature:  Dorsalis Pedis pulse 1/4 bilateral. Posterior Tibial pulse  2/4 bilateral. Capillary fill time <3 sec 1-5 bilateral with hyperemia secondary to reflux vascular from neuropathy. Positive hair growth to the level of the digits. Temperature gradient within normal limits. No varicosities present bilateral.   Neurology: The patient has diminished sensation measured with a 5.07/10g Semmes Weinstein Monofilament at  all pedal sites bilateral . Vibratory sensation diminished bilateral with tuning fork. No Babinski sign present bilateral.   Musculoskeletal: Asymptomatic charcot on right s/p recon with soft tissue mass pedal deformities noted at right hallux. Muscular strength 5/5 in all lower extremity muscular groups bilateral without on range of motion however there is limitation on right secondary to fusion . No tenderness with calf compression bilateral.  Xray right foot: Hardware intact with arthritis and no obvious bone involvement at right hallux at area of soft tissue swelling and mass.   Assessment and Plan: Problem List Items Addressed This Visit    None    Visit Diagnoses    Right foot pain    -  Primary   Relevant Orders   DG Foot 2 Views Right   Soft tissue mass       Diabetic polyneuropathy associated with type 2 diabetes mellitus (HCC)       Type 2 diabetes mellitus with Charcot's joint of right foot ([place])          -Examined patient. -Discussed and educated patient on diabetic foot care, especially with  regards to the vascular, neurological and musculoskeletal systems.  -Stressed the importance of good glycemic control and the detriment of not  controlling glucose levels in relation to the foot. -Rx Open MRI right foot to further eval right hallux soft tissue mass -Recommend offloading with diabetic shoes and inserts -Continue with neuropathy management with PCP -Answered all patient questions -Patient to return after MRI -Patient advised to call the office if any problems or questions arise in the meantime.  Landis Martins, DPM

## 2016-09-03 DIAGNOSIS — R2241 Localized swelling, mass and lump, right lower limb: Secondary | ICD-10-CM | POA: Diagnosis not present

## 2016-09-03 DIAGNOSIS — D6489 Other specified anemias: Secondary | ICD-10-CM | POA: Diagnosis not present

## 2016-09-03 DIAGNOSIS — R232 Flushing: Secondary | ICD-10-CM | POA: Diagnosis not present

## 2016-09-03 DIAGNOSIS — E790 Hyperuricemia without signs of inflammatory arthritis and tophaceous disease: Secondary | ICD-10-CM | POA: Diagnosis not present

## 2016-09-03 DIAGNOSIS — E7800 Pure hypercholesterolemia, unspecified: Secondary | ICD-10-CM | POA: Diagnosis not present

## 2016-09-08 ENCOUNTER — Ambulatory Visit
Admission: RE | Admit: 2016-09-08 | Discharge: 2016-09-08 | Disposition: A | Discharge status: Home / Self Care | Payer: Medicare Other | Source: Ambulatory Visit | Attending: Sports Medicine | Admitting: Sports Medicine

## 2016-09-08 DIAGNOSIS — R2241 Localized swelling, mass and lump, right lower limb: Secondary | ICD-10-CM | POA: Diagnosis not present

## 2016-09-08 MED ORDER — GADOBENATE DIMEGLUMINE 529 MG/ML IV SOLN
20.0000 mL | Freq: Once | INTRAVENOUS | Status: AC | PRN
  Administered 2016-09-08: 20 mL via INTRAVENOUS

## 2016-09-11 NOTE — Telephone Encounter (Addendum)
-----   Message from Landis Martins, [place] sent at 09/08/2016  1:38 PM EST ----- Can you let patient know MRI reveals normal fat at great toe. If she has pain or any concerns she can make an office appointment with me to further discuss Thanks Dr. Cannon Kettle. 09/11/2016-Left message informing pt MRI results were available, call for results. Unable to leave a message voicemail box is not setup.

## 2016-09-23 DIAGNOSIS — M8588 Other specified disorders of bone density and structure, other site: Secondary | ICD-10-CM | POA: Diagnosis not present

## 2016-09-23 DIAGNOSIS — M810 Age-related osteoporosis without current pathological fracture: Secondary | ICD-10-CM | POA: Diagnosis not present

## 2016-09-30 ENCOUNTER — Ambulatory Visit (INDEPENDENT_AMBULATORY_CARE_PROVIDER_SITE_OTHER): Payer: Medicare Other | Admitting: Sports Medicine

## 2016-09-30 ENCOUNTER — Encounter: Payer: Self-pay | Admitting: Sports Medicine

## 2016-09-30 DIAGNOSIS — M799 Soft tissue disorder, unspecified: Secondary | ICD-10-CM | POA: Diagnosis not present

## 2016-09-30 DIAGNOSIS — E11610 Type 2 diabetes mellitus with diabetic neuropathic arthropathy: Secondary | ICD-10-CM | POA: Diagnosis not present

## 2016-09-30 DIAGNOSIS — E1142 Type 2 diabetes mellitus with diabetic polyneuropathy: Secondary | ICD-10-CM | POA: Diagnosis not present

## 2016-09-30 DIAGNOSIS — M79671 Pain in right foot: Secondary | ICD-10-CM

## 2016-09-30 DIAGNOSIS — M7989 Other specified soft tissue disorders: Secondary | ICD-10-CM

## 2016-09-30 NOTE — Progress Notes (Signed)
Subjective: Pamela Snyder is a 71 y.o. female patient with history of diabetes who returns to office today for MRI results of right foot. Patient states that the glucose reading this morning was "good". Patient denies any new changes in medication or new problems. Patient denies any new cramping, numbness, burning or tingling in the legs on neuropathy medication.   Patient Active Problem List   Diagnosis Date Noted  . Charcot foot due to diabetes mellitus ([place]) 11/23/2014   Current Outpatient Prescriptions on File Prior to Visit  Medication Sig Dispense Refill  . acetaminophen (TYLENOL) 325 MG tablet Take 2 tablets (650 mg total) by mouth every 6 (six) hours as needed for mild pain or moderate pain (or Fever >/= 101).    [name] allopurinol (ZYLOPRIM) 300 MG tablet Take 300 mg by mouth daily.    [name] aspirin EC 325 MG EC tablet Take 1 tablet (325 mg total) by mouth daily. 42 tablet 0  . B Complex-C (SUPER B COMPLEX PO) Take 1 tablet by mouth daily.    [name] BIOTIN PO Take 1 tablet by mouth daily.    [name] CALCIUM-MAGNESIUM-ZINC PO Take 1 tablet by mouth daily.    . Cholecalciferol 1000 UNITS capsule Take 1,000 Units by mouth daily.    [name] docusate sodium (COLACE) 100 MG capsule Take 1 capsule (100 mg total) by mouth 2 (two) times daily. 30 capsule 0  . folic acid (FOLVITE) 1 MG tablet Take 1 mg by mouth daily.    . furosemide (LASIX) 20 MG tablet Take 20 mg by mouth daily as needed for fluid.    . Ginkgo Biloba 40 MG TABS Take 1 tablet by mouth daily.    . metFORMIN (GLUCOPHAGE-XR) 500 MG 24 hr tablet Take 1 tablet by mouth daily.  4  . methocarbamol (ROBAXIN) 500 MG tablet Take 1 tablet (500 mg total) by mouth every 6 (six) hours as needed for muscle spasms. 20 tablet 0  . metoprolol succinate (TOPROL-XL) 100 MG 24 hr tablet Take 100 mg by mouth daily. Take with or immediately following a meal.    . Multiple Vitamins-Minerals (MULTIVITAMIN WITH MINERALS) tablet Take 1 tablet by mouth daily.    [name]  olmesartan-hydrochlorothiazide (BENICAR HCT) 40-25 MG per tablet Take 0.5 tablets by mouth daily.    [name] oxyCODONE (OXY IR/ROXICODONE) 5 MG immediate release tablet Take 1-2 tablets (5-10 mg total) by mouth every 4 (four) hours as needed for severe pain or breakthrough pain. 30 tablet 0  . pantoprazole (PROTONIX) 40 MG tablet Take 40 mg by mouth daily.    . potassium chloride SA (K-DUR,KLOR-CON) 20 MEQ tablet Take 20 mEq by mouth daily as needed.    . pregabalin (LYRICA) 150 MG capsule Take 150 mg by mouth 3 (three) times daily.    [name] senna (SENOKOT) 8.6 MG TABS tablet Take 1 tablet (8.6 mg total) by mouth 2 (two) times daily. 60 each 0  . traZODone (DESYREL) 50 MG tablet Take 150 mg by mouth at bedtime as needed.      No current facility-administered medications on file prior to visit.    No Known Allergies  No results found for this or any previous visit (from the past 2160 hour(s)).  Objective: General: Patient is awake, alert, and oriented x 3 and in no acute distress.  Integument: Skin is warm, dry and supple bilateral. Nails are short and mildy thickened and dystrophic with subungual debris, consistent with onychomycosis, 1-5 bilateral. + soft tissue mass  at plantar right hallux that is nonpainful to touch and nonpulsatile. No signs of infection. No open lesions or preulcerative lesions present bilateral. Remaining integument unremarkable.  Vasculature:  Dorsalis Pedis pulse 1/4 bilateral. Posterior Tibial pulse  2/4 bilateral. Capillary fill time <3 sec 1-5 bilateral with hyperemia secondary to reflux vascular from neuropathy. Positive hair growth to the level of the digits. Temperature gradient within normal limits. No varicosities present bilateral.   Neurology: The patient has diminished sensation measured with a 5.07/10g Semmes Weinstein Monofilament at all pedal sites bilateral . Vibratory sensation diminished bilateral with tuning fork. No Babinski sign present bilateral.    Musculoskeletal: Asymptomatic charcot on right s/p recon with soft tissue mass pedal deformities noted at right hallux. Muscular strength 5/5 in all lower extremity muscular groups bilateral without on range of motion however there is limitation on right secondary to fusion . No tenderness with calf compression bilateral.  MRI, Right foot IMPRESSION: Negative exam.  Prominent but otherwise normal fat of the great toe.  Assessment and Plan: Problem List Items Addressed This Visit    None    Visit Diagnoses    Soft tissue mass    -  Primary   Right foot pain       Diabetic polyneuropathy associated with type 2 diabetes mellitus (HCC)       Type 2 diabetes mellitus with Charcot's joint of right foot ([place])          -Examined patient. -Discussed and educated patient on diabetic foot care, especially with  regards to the vascular, neurological and musculoskeletal systems.  -Stressed the importance of good glycemic control and the detriment of not  controlling glucose levels in relation to the foot. -MRI results reviewed -Dispensed toe cap for right  -Recommend  Continue with offloading with diabetic shoes and inserts -Continue with neuropathy management with PCP -Answered all patient questions -Return as needed. Patient advised to call the office if any problems or questions arise in the meantime.  Landis Martins, DPM

## 2017-01-14 ENCOUNTER — Ambulatory Visit
Admission: RE | Admit: 2017-01-14 | Discharge: 2017-01-14 | Disposition: A | Discharge status: Home / Self Care | Payer: Medicare Other | Source: Ambulatory Visit | Attending: Internal Medicine | Admitting: Internal Medicine

## 2017-01-14 ENCOUNTER — Other Ambulatory Visit: Payer: Self-pay | Admitting: Internal Medicine

## 2017-01-14 DIAGNOSIS — I10 Essential (primary) hypertension: Secondary | ICD-10-CM | POA: Diagnosis not present

## 2017-01-14 DIAGNOSIS — S99922A Unspecified injury of left foot, initial encounter: Secondary | ICD-10-CM

## 2017-01-14 DIAGNOSIS — E1143 Type 2 diabetes mellitus with diabetic autonomic (poly)neuropathy: Secondary | ICD-10-CM | POA: Diagnosis not present

## 2017-01-14 DIAGNOSIS — M7989 Other specified soft tissue disorders: Secondary | ICD-10-CM | POA: Diagnosis not present

## 2017-01-14 DIAGNOSIS — Z8739 Personal history of other diseases of the musculoskeletal system and connective tissue: Secondary | ICD-10-CM | POA: Diagnosis not present

## 2017-02-11 DIAGNOSIS — E790 Hyperuricemia without signs of inflammatory arthritis and tophaceous disease: Secondary | ICD-10-CM | POA: Diagnosis not present

## 2017-02-11 DIAGNOSIS — Z6839 Body mass index (BMI) 39.0-39.9, adult: Secondary | ICD-10-CM | POA: Diagnosis not present

## 2017-02-11 DIAGNOSIS — I7381 Erythromelalgia: Secondary | ICD-10-CM | POA: Diagnosis not present

## 2017-02-11 DIAGNOSIS — E669 Obesity, unspecified: Secondary | ICD-10-CM | POA: Diagnosis not present

## 2017-05-21 DIAGNOSIS — K625 Hemorrhage of anus and rectum: Secondary | ICD-10-CM | POA: Diagnosis not present

## 2017-05-21 DIAGNOSIS — E11610 Type 2 diabetes mellitus with diabetic neuropathic arthropathy: Secondary | ICD-10-CM | POA: Diagnosis not present

## 2017-05-21 DIAGNOSIS — E1143 Type 2 diabetes mellitus with diabetic autonomic (poly)neuropathy: Secondary | ICD-10-CM | POA: Diagnosis not present

## 2017-05-21 DIAGNOSIS — N183 Chronic kidney disease, stage 3 (moderate): Secondary | ICD-10-CM | POA: Diagnosis not present

## 2017-05-21 DIAGNOSIS — Z23 Encounter for immunization: Secondary | ICD-10-CM | POA: Diagnosis not present

## 2017-05-21 DIAGNOSIS — Z7984 Long term (current) use of oral hypoglycemic drugs: Secondary | ICD-10-CM | POA: Diagnosis not present

## 2017-06-09 DIAGNOSIS — Z1211 Encounter for screening for malignant neoplasm of colon: Secondary | ICD-10-CM | POA: Diagnosis not present

## 2017-06-09 DIAGNOSIS — R194 Change in bowel habit: Secondary | ICD-10-CM | POA: Diagnosis not present

## 2017-06-09 DIAGNOSIS — K625 Hemorrhage of anus and rectum: Secondary | ICD-10-CM | POA: Diagnosis not present

## 2017-07-14 ENCOUNTER — Other Ambulatory Visit: Payer: Self-pay | Admitting: Internal Medicine

## 2017-07-14 DIAGNOSIS — Z0000 Encounter for general adult medical examination without abnormal findings: Secondary | ICD-10-CM | POA: Diagnosis not present

## 2017-07-14 DIAGNOSIS — Z122 Encounter for screening for malignant neoplasm of respiratory organs: Secondary | ICD-10-CM | POA: Diagnosis not present

## 2017-07-14 DIAGNOSIS — Z1389 Encounter for screening for other disorder: Secondary | ICD-10-CM | POA: Diagnosis not present

## 2017-07-14 DIAGNOSIS — Z1231 Encounter for screening mammogram for malignant neoplasm of breast: Secondary | ICD-10-CM | POA: Diagnosis not present

## 2017-07-14 DIAGNOSIS — Z87891 Personal history of nicotine dependence: Secondary | ICD-10-CM

## 2017-07-22 DIAGNOSIS — K625 Hemorrhage of anus and rectum: Secondary | ICD-10-CM | POA: Diagnosis not present

## 2017-07-22 DIAGNOSIS — R194 Change in bowel habit: Secondary | ICD-10-CM | POA: Diagnosis not present

## 2017-07-22 DIAGNOSIS — Z1211 Encounter for screening for malignant neoplasm of colon: Secondary | ICD-10-CM | POA: Diagnosis not present

## 2017-07-24 ENCOUNTER — Ambulatory Visit: Payer: Medicare Other

## 2017-08-03 ENCOUNTER — Ambulatory Visit: Payer: Medicare Other

## 2017-08-10 DIAGNOSIS — Z6841 Body Mass Index (BMI) 40.0 and over, adult: Secondary | ICD-10-CM | POA: Diagnosis not present

## 2017-08-10 DIAGNOSIS — I7381 Erythromelalgia: Secondary | ICD-10-CM | POA: Diagnosis not present

## 2017-08-10 DIAGNOSIS — E790 Hyperuricemia without signs of inflammatory arthritis and tophaceous disease: Secondary | ICD-10-CM | POA: Diagnosis not present

## 2017-08-17 ENCOUNTER — Ambulatory Visit
Admission: RE | Admit: 2017-08-17 | Discharge: 2017-08-17 | Disposition: A | Discharge status: Home / Self Care | Payer: Medicare Other | Source: Ambulatory Visit | Attending: Internal Medicine | Admitting: Internal Medicine

## 2017-08-17 DIAGNOSIS — Z87891 Personal history of nicotine dependence: Secondary | ICD-10-CM

## 2017-08-31 ENCOUNTER — Ambulatory Visit
Admission: RE | Admit: 2017-08-31 | Discharge: 2017-08-31 | Disposition: A | Discharge status: Home / Self Care | Payer: Medicare Other | Source: Ambulatory Visit | Attending: Internal Medicine | Admitting: Internal Medicine

## 2017-08-31 DIAGNOSIS — Z1231 Encounter for screening mammogram for malignant neoplasm of breast: Secondary | ICD-10-CM | POA: Diagnosis not present

## 2017-09-08 DIAGNOSIS — Z87891 Personal history of nicotine dependence: Secondary | ICD-10-CM | POA: Diagnosis not present

## 2017-09-08 DIAGNOSIS — M4850XA Collapsed vertebra, not elsewhere classified, site unspecified, initial encounter for fracture: Secondary | ICD-10-CM | POA: Diagnosis not present

## 2017-09-08 DIAGNOSIS — I2510 Atherosclerotic heart disease of native coronary artery without angina pectoris: Secondary | ICD-10-CM | POA: Diagnosis not present

## 2017-09-14 DIAGNOSIS — R5383 Other fatigue: Secondary | ICD-10-CM | POA: Diagnosis not present

## 2017-09-14 DIAGNOSIS — M810 Age-related osteoporosis without current pathological fracture: Secondary | ICD-10-CM | POA: Diagnosis not present

## 2017-09-14 DIAGNOSIS — E559 Vitamin D deficiency, unspecified: Secondary | ICD-10-CM | POA: Diagnosis not present

## 2017-09-16 DIAGNOSIS — D72829 Elevated white blood cell count, unspecified: Secondary | ICD-10-CM | POA: Diagnosis not present

## 2017-09-16 DIAGNOSIS — M810 Age-related osteoporosis without current pathological fracture: Secondary | ICD-10-CM | POA: Diagnosis not present

## 2017-09-16 DIAGNOSIS — D72828 Other elevated white blood cell count: Secondary | ICD-10-CM | POA: Diagnosis not present

## 2017-09-24 ENCOUNTER — Encounter: Payer: Self-pay | Admitting: Interventional Cardiology

## 2017-09-30 DIAGNOSIS — E559 Vitamin D deficiency, unspecified: Secondary | ICD-10-CM | POA: Diagnosis not present

## 2017-09-30 DIAGNOSIS — M810 Age-related osteoporosis without current pathological fracture: Secondary | ICD-10-CM | POA: Diagnosis not present

## 2017-10-04 DIAGNOSIS — I700 Atherosclerosis of aorta: Secondary | ICD-10-CM | POA: Insufficient documentation

## 2017-10-04 DIAGNOSIS — R079 Chest pain, unspecified: Secondary | ICD-10-CM | POA: Insufficient documentation

## 2017-10-04 NOTE — Progress Notes (Signed)
Cardiology Office Note    Date:  10/05/2017   ID:  [name], [name] 1946-05-21, MRN [id number]  PCP:  Lanice Shirts, MD  Cardiologist: Sinclair Grooms, MD   Chief Complaint  Patient presents with  . Coronary Artery Disease    History of Present Illness:  Pamela Snyder is a 72 y.o. female referred for cardiology consult by Dr. Coralyn Mark for evaluation of CAD.  Pamela Snyder is a nice 72 year old with prior history of heavy cigarette smoking (discontinued 15 years ago), strong family history of vascular disease (on both her paternal and maternal side), type 2 diabetes mellitus, hypertension, and hyperlipidemia who was recently identified to have coronary artery calcification on a low-dose chest CT to rule out lung cancer.  Also noted to have aortic atherosclerosis.  She has vague very brief episodes of focal left parasternal chest discomfort that is of stabbing quality and lasts seconds before resolving.  She is not very active, because of Charcot joint and right foot.  No chest discomfort of any significance but has dyspnea on exertion.  Dyspnea on exertion is progressive and new over the past 2 years.  Past Medical History:  Diagnosis Date  . Arthritis    "right foot; spine; hands" (11/23/2014)  . Charcot foot due to diabetes mellitus ([place])   . GERD (gastroesophageal reflux disease)   . Gout   . Hypertension   . Migraine    hx  . Thyroid goiter 1986  . Type II diabetes mellitus ([place])     Past Surgical History:  Procedure Laterality Date  . ABDOMINAL HYSTERECTOMY  1999   w/BSO  . ACHILLES TENDON LENGTHENING Right 11/23/2014  . ACHILLES TENDON LENGTHENING Right 11/23/2014   Procedure: RIGHT ACHILLES PERCUTANEOUS TENDON LENGTHENING;  Surgeon: Wylene Simmer, MD;  Location: [facility];  Service: Orthopedics;  Laterality: Right;  . APPENDECTOMY  1963  . ARTHRODESIS Right 11/23/2014   mid foot  . CARDIAC CATHETERIZATION  08/2004  . FOOT ARTHRODESIS Right 11/23/2014   Procedure:  RIGHT MID FOOT ARTHRODESIS;  Surgeon: Wylene Simmer, MD;  Location: [facility];  Service: Orthopedics;  Laterality: Right;  . METATARSAL OSTEOTOMY Right 11/23/2014   Procedure: RIGHT MID FOOT OSTEOTOMY;  Surgeon: Wylene Simmer, MD;  Location: [facility];  Service: Orthopedics;  Laterality: Right;  . OSTEOTOMY Right 11/23/2014   mid foot  . PARTIAL THYMECTOMY  1986   ? side  . TONSILLECTOMY  1954    Current Medications: Outpatient Medications Prior to Visit  Medication Sig Dispense Refill  . allopurinol (ZYLOPRIM) 100 MG tablet Take 100 mg by mouth daily.  3  . Calcium Carbonate-Vitamin D (OSCAL 500/200 D-3 PO) Take 1 tablet by mouth daily.    . Cholecalciferol 1000 UNITS capsule Take 2,000 Units by mouth daily.     [name] esomeprazole (NEXIUM) 20 MG capsule Take 20 mg by mouth daily at 12 noon.    . furosemide (LASIX) 20 MG tablet Take 20 mg by mouth daily as needed for fluid.    [name] glipiZIDE (GLUCOTROL XL) 5 MG 24 hr tablet Take 5 mg by mouth daily.  3  . losartan-hydrochlorothiazide (HYZAAR) 100-25 MG tablet Take half (1/2) tablet by mouth daily.  2  . LYRICA 75 MG capsule Take 75 mg by mouth 4 (four) times daily.  1  . metoprolol succinate (TOPROL-XL) 100 MG 24 hr tablet Take 100 mg by mouth daily. Take with or immediately following a meal.    . Multiple Vitamins-Minerals (CENTRUM SILVER  ULTRA WOMENS PO) Take 1 tablet by mouth daily.    . potassium chloride SA (K-DUR,KLOR-CON) 20 MEQ tablet Take 20 mEq by mouth daily as needed (take with lasix for fluid retention).     [name] acetaminophen (TYLENOL) 325 MG tablet Take 2 tablets (650 mg total) by mouth every 6 (six) hours as needed for mild pain or moderate pain (or Fever >/= 101). (Patient not taking: Reported on 10/05/2017)    . allopurinol (ZYLOPRIM) 300 MG tablet Take 300 mg by mouth daily.    [name] aspirin EC 325 MG EC tablet Take 1 tablet (325 mg total) by mouth daily. (Patient not taking: Reported on 10/05/2017) 42 tablet 0  . B Complex-C (SUPER B COMPLEX PO) Take  1 tablet by mouth daily.    [name] BIOTIN PO Take 1 tablet by mouth daily.    [name] CALCIUM-MAGNESIUM-ZINC PO Take 1 tablet by mouth daily.    [name] docusate sodium (COLACE) 100 MG capsule Take 1 capsule (100 mg total) by mouth 2 (two) times daily. (Patient not taking: Reported on 10/05/2017) 30 capsule 0  . folic acid (FOLVITE) 1 MG tablet Take 1 mg by mouth daily.    . Ginkgo Biloba 40 MG TABS Take 1 tablet by mouth daily.    . metFORMIN (GLUCOPHAGE-XR) 500 MG 24 hr tablet Take 1 tablet by mouth daily.  4  . methocarbamol (ROBAXIN) 500 MG tablet Take 1 tablet (500 mg total) by mouth every 6 (six) hours as needed for muscle spasms. (Patient not taking: Reported on 10/05/2017) 20 tablet 0  . Multiple Vitamins-Minerals (MULTIVITAMIN WITH MINERALS) tablet Take 1 tablet by mouth daily.    [name] olmesartan-hydrochlorothiazide (BENICAR HCT) 40-25 MG per tablet Take 0.5 tablets by mouth daily.    [name] oxyCODONE (OXY IR/ROXICODONE) 5 MG immediate release tablet Take 1-2 tablets (5-10 mg total) by mouth every 4 (four) hours as needed for severe pain or breakthrough pain. (Patient not taking: Reported on 10/05/2017) 30 tablet 0  . pantoprazole (PROTONIX) 40 MG tablet Take 40 mg by mouth daily.    . pregabalin (LYRICA) 150 MG capsule Take 150 mg by mouth 3 (three) times daily.    [name] senna (SENOKOT) 8.6 MG TABS tablet Take 1 tablet (8.6 mg total) by mouth 2 (two) times daily. (Patient not taking: Reported on 10/05/2017) 60 each 0  . traZODone (DESYREL) 50 MG tablet Take 150 mg by mouth at bedtime as needed.      No facility-administered medications prior to visit.      Allergies:   Patient has no known allergies.   Social History   Socioeconomic History  . Marital status: Married    Spouse name: Not on file  . Number of children: 5  . Years of education: Not on file  . Highest education level: Not on file  Occupational History  . Not on file  Social Needs  . Financial resource strain: Not on file  . Food insecurity:     Worry: Not on file    Inability: Not on file  . Transportation needs:    Medical: Not on file    Non-medical: Not on file  Tobacco Use  . Smoking status: Former Smoker    Packs/day: 1.00    Years: 37.00    Pack years: 37.00    Types: Cigarettes    Last attempt to quit: 11/15/2003    Years since quitting: 13.8  . Smokeless tobacco: Never Used  Substance and Sexual Activity  . Alcohol  use: No  . Drug use: No  . Sexual activity: Yes  Lifestyle  . Physical activity:    Days per week: Not on file    Minutes per session: Not on file  . Stress: Not on file  Relationships  . Social connections:    Talks on phone: Not on file    Gets together: Not on file    Attends religious service: Not on file    Active member of club or organization: Not on file    Attends meetings of clubs or organizations: Not on file    Relationship status: Not on file  Other Topics Concern  . Not on file  Social History Narrative  . Not on file     Family History:  The patient's family history is not on file.   ROS:   Please see the history of present illness.    Snores loudly at night.  Does not sleep well.  Does not lie flat.  Sleeps only 2-hour stretches at a time.  Prior history of cardiac workup after a stress test was abnormal.  Coronary angiography identified widely patent arteries greater than 10 years ago. All other systems reviewed and are negative.   PHYSICAL EXAM:   VS:  BP 126/76   Pulse 83   Ht 5\' 5"  (1.651 m)   Wt 236 lb 9.6 oz (107.3 kg)   BMI 39.37 kg/m    GEN: Well nourished, well developed, in no acute distress  HEENT: normal  Neck: no JVD, right supraclavicular carotid bruit. Cardiac: RRR; no murmurs, rubs, or gallops,no edema  Respiratory:  clear to auscultation bilaterally, normal work of breathing GI: soft, nontender, nondistended, + BS MS: no deformity or atrophy  Skin: warm and dry, no rash Neuro:  Alert and Oriented x 3, Strength and sensation are intact Psych:  euthymic mood, full affect  Wt Readings from Last 3 Encounters:  10/05/17 236 lb 9.6 oz (107.3 kg)  01/02/15 236 lb 6.4 oz (107.2 kg)  11/23/14 231 lb (104.8 kg)      Studies/Labs Reviewed:   EKG:  EKG normal sinus rhythm with normal appearance.  Recent Labs: No results found for requested labs within last 8760 hours.   Lipid Panel No results found for: CHOL, TRIG, HDL, CHOLHDL, VLDL, LDLCALC, LDLDIRECT  Additional studies/ records that were reviewed today include:  2019 Chest CT Scan: IMPRESSION: 1. Lung-RADS 2S, benign appearance or behavior. Continue annual screening with low-dose chest CT without contrast in 12 months. 2. The "S" modifier above refers to potentially clinically significant non lung cancer related findings. Specifically, there is aortic atherosclerosis, in addition to three-vessel coronary artery disease. Assessment for potential risk factor modification, dietary therapy or pharmacologic therapy may be warranted, if clinically indicated. 3. Mild diffuse bronchial wall thickening with mild centrilobular and paraseptal emphysema; imaging findings suggestive of underlying COPD. 4. Mild hepatic steatosis. 5. Probable subacute compression fracture of T12 with approximately 50% loss of anterior vertebral body height.   Aortic Atherosclerosis (ICD10-I70.0) and Emphysema (ICD10-J43.9).    ASSESSMENT:    1. Coronary artery calcification seen on CT scan   2. Bruit of right carotid artery   3. DOE (dyspnea on exertion)   4. Aortic atherosclerosis (HCC)   5. Chest pain, unspecified type      PLAN:  In order of problems listed above:  1. Needs aggressive risk factor modification: Aspirin 81 mg/day, LDL less than 70, blood pressure 130/80 mmHg, aerobic activity as tolerated, and weight loss.  We will perform a myocardial perfusion study to rule out high risk subset using Lexiscan stress that she is not able to walk on the treadmill. 2. Bilateral carotid  Doppler has been ordered. 3. Could represent an anginal equivalent but could also just as easily be related to COPD and physical deconditioning.  Nuclear scintigraphy will rule out high risk ischemic subset. 4. Risk factor modification as noted above. 5. Atypical and not felt to represent ischemia.  Unless we find significant evidence of coronary perfusion abnormality will return care to primary physician for aggressive risk factor modification.  Also needs to consider a sleep study.    Medication Adjustments/Labs and Tests Ordered: Current medicines are reviewed at length with the patient today.  Concerns regarding medicines are outlined above.  Medication changes, Labs and Tests ordered today are listed in the Patient Instructions below. Patient Instructions  Medication Instructions:  1) START Aspirin 81mg  once daily 2) START Rosuvastatin 5mg  once daily  Labwork: Your physician recommends that you return for lab work in: 6 weeks (Lipid, liver).  Please come fasting (nothing to eat or drink after midnight, except water or black coffee) for these labs.   Testing/Procedures: Your physician has requested that you have a carotid duplex. This test is an ultrasound of the carotid arteries in your neck. It looks at blood flow through these arteries that supply the brain with blood. Allow one hour for this exam. There are no restrictions or special instructions.   Follow-Up: Your physician recommends that you schedule a follow-up appointment as needed with Dr. Tamala Julian.   Any Other Special Instructions Will Be Listed Below (If Applicable).     If you need a refill on your cardiac medications before your next appointment, please call your pharmacy.      Signed, Sinclair Grooms, MD  10/05/2017 10:37 AM    [facility], [place], [place]  [address] Phone: [phone]; Fax: [phone]

## 2017-10-05 ENCOUNTER — Encounter: Payer: Self-pay | Admitting: Interventional Cardiology

## 2017-10-05 ENCOUNTER — Ambulatory Visit (INDEPENDENT_AMBULATORY_CARE_PROVIDER_SITE_OTHER): Payer: Medicare Other | Admitting: Interventional Cardiology

## 2017-10-05 DIAGNOSIS — I700 Atherosclerosis of aorta: Secondary | ICD-10-CM

## 2017-10-05 DIAGNOSIS — I2510 Atherosclerotic heart disease of native coronary artery without angina pectoris: Secondary | ICD-10-CM | POA: Diagnosis not present

## 2017-10-05 DIAGNOSIS — R0989 Other specified symptoms and signs involving the circulatory and respiratory systems: Secondary | ICD-10-CM

## 2017-10-05 DIAGNOSIS — R0609 Other forms of dyspnea: Secondary | ICD-10-CM | POA: Diagnosis not present

## 2017-10-05 DIAGNOSIS — R079 Chest pain, unspecified: Secondary | ICD-10-CM | POA: Diagnosis not present

## 2017-10-05 DIAGNOSIS — R0600 Dyspnea, unspecified: Secondary | ICD-10-CM | POA: Insufficient documentation

## 2017-10-05 MED ORDER — ROSUVASTATIN CALCIUM 5 MG PO TABS: 5.0000 mg | ORAL_TABLET | Freq: Every day | ORAL | 3 refills | Status: DC

## 2017-10-05 MED ORDER — ASPIRIN EC 81 MG PO TBEC: 81.0000 mg | DELAYED_RELEASE_TABLET | Freq: Every day | ORAL | 3 refills | Status: DC

## 2017-10-05 NOTE — Patient Instructions (Signed)
Medication Instructions:  1) START Aspirin 81mg  once daily 2) START Rosuvastatin 5mg  once daily  Labwork: Your physician recommends that you return for lab work in: 6 weeks (Lipid, liver).  Please come fasting (nothing to eat or drink after midnight, except water or black coffee) for these labs.   Testing/Procedures: Your physician has requested that you have a carotid duplex. This test is an ultrasound of the carotid arteries in your neck. It looks at blood flow through these arteries that supply the brain with blood. Allow one hour for this exam. There are no restrictions or special instructions.   Follow-Up: Your physician recommends that you schedule a follow-up appointment as needed with Dr. Tamala Julian.   Any Other Special Instructions Will Be Listed Below (If Applicable).     If you need a refill on your cardiac medications before your next appointment, please call your pharmacy.

## 2017-10-12 DIAGNOSIS — N183 Chronic kidney disease, stage 3 (moderate): Secondary | ICD-10-CM | POA: Diagnosis not present

## 2017-10-12 DIAGNOSIS — E7800 Pure hypercholesterolemia, unspecified: Secondary | ICD-10-CM | POA: Diagnosis not present

## 2017-10-12 DIAGNOSIS — I2510 Atherosclerotic heart disease of native coronary artery without angina pectoris: Secondary | ICD-10-CM | POA: Diagnosis not present

## 2017-10-12 DIAGNOSIS — Z7984 Long term (current) use of oral hypoglycemic drugs: Secondary | ICD-10-CM | POA: Diagnosis not present

## 2017-10-12 DIAGNOSIS — E1143 Type 2 diabetes mellitus with diabetic autonomic (poly)neuropathy: Secondary | ICD-10-CM | POA: Diagnosis not present

## 2017-10-12 DIAGNOSIS — Z8739 Personal history of other diseases of the musculoskeletal system and connective tissue: Secondary | ICD-10-CM | POA: Diagnosis not present

## 2017-10-12 DIAGNOSIS — M4850XA Collapsed vertebra, not elsewhere classified, site unspecified, initial encounter for fracture: Secondary | ICD-10-CM | POA: Diagnosis not present

## 2017-10-13 ENCOUNTER — Ambulatory Visit (HOSPITAL_COMMUNITY)
Admission: RE | Admit: 2017-10-13 | Discharge: 2017-10-13 | Disposition: A | Discharge status: Home / Self Care | Payer: Medicare Other | Source: Ambulatory Visit | Attending: Cardiology | Admitting: Cardiology

## 2017-10-13 DIAGNOSIS — I6523 Occlusion and stenosis of bilateral carotid arteries: Secondary | ICD-10-CM | POA: Insufficient documentation

## 2017-10-13 DIAGNOSIS — Z87891 Personal history of nicotine dependence: Secondary | ICD-10-CM | POA: Insufficient documentation

## 2017-10-13 DIAGNOSIS — R0989 Other specified symptoms and signs involving the circulatory and respiratory systems: Secondary | ICD-10-CM

## 2017-11-17 ENCOUNTER — Other Ambulatory Visit: Payer: Medicare Other

## 2017-12-01 ENCOUNTER — Other Ambulatory Visit: Payer: Medicare Other | Admitting: *Deleted

## 2017-12-01 DIAGNOSIS — I2510 Atherosclerotic heart disease of native coronary artery without angina pectoris: Secondary | ICD-10-CM | POA: Diagnosis not present

## 2017-12-01 LAB — HEPATIC FUNCTION PANEL
ALT: 24 IU/L (ref 0–32)
AST: 19 IU/L (ref 0–40)
Albumin: 4.2 g/dL (ref 3.5–4.8)
Alkaline Phosphatase: 95 IU/L (ref 39–117)
Bilirubin Total: 0.5 mg/dL (ref 0.0–1.2)
Bilirubin, Direct: 0.16 mg/dL (ref 0.00–0.40)
Total Protein: 6.7 g/dL (ref 6.0–8.5)

## 2017-12-01 LAB — LIPID PANEL
Chol/HDL Ratio: 3 ratio (ref 0.0–4.4)
Cholesterol, Total: 136 mg/dL (ref 100–199)
HDL: 45 mg/dL (ref >39)
LDL Calculated: 55 mg/dL (ref 0–99)
Triglycerides: 180 mg/dL — ABNORMAL HIGH (ref 0–149)
VLDL Cholesterol Cal: 36 mg/dL (ref 5–40)

## 2018-02-03 DIAGNOSIS — M810 Age-related osteoporosis without current pathological fracture: Secondary | ICD-10-CM | POA: Diagnosis not present

## 2018-02-03 DIAGNOSIS — N183 Chronic kidney disease, stage 3 (moderate): Secondary | ICD-10-CM | POA: Diagnosis not present

## 2018-02-03 DIAGNOSIS — E1143 Type 2 diabetes mellitus with diabetic autonomic (poly)neuropathy: Secondary | ICD-10-CM | POA: Diagnosis not present

## 2018-02-03 DIAGNOSIS — Z7984 Long term (current) use of oral hypoglycemic drugs: Secondary | ICD-10-CM | POA: Diagnosis not present

## 2018-02-03 DIAGNOSIS — I2510 Atherosclerotic heart disease of native coronary artery without angina pectoris: Secondary | ICD-10-CM | POA: Diagnosis not present

## 2018-02-08 ENCOUNTER — Telehealth: Payer: Self-pay | Admitting: Interventional Cardiology

## 2018-02-08 DIAGNOSIS — I2510 Atherosclerotic heart disease of native coronary artery without angina pectoris: Secondary | ICD-10-CM

## 2018-02-08 DIAGNOSIS — I700 Atherosclerosis of aorta: Secondary | ICD-10-CM

## 2018-02-08 NOTE — Telephone Encounter (Signed)
Spoke with patient, she was following up on her office visit with Dr. Tamala Julian, on 10/05/17. The patient said she was supposed to be scheduled for a lexiscan stress test. Per Dr. Thompson Caul office note, he wanted an order placed for it, but it was not placed. I added the order and am sending to scheduling to make an appointment. The patient confirmed and expressed understanding.

## 2018-02-08 NOTE — Telephone Encounter (Signed)
New Message    Patient is calling because in April it was discussed that she may need to have a stress test. But it was never scheduled. She would like to know if she still needs to have the test. Please call to discuss.

## 2018-02-16 ENCOUNTER — Telehealth (HOSPITAL_COMMUNITY): Payer: Self-pay | Admitting: *Deleted

## 2018-02-16 ENCOUNTER — Telehealth: Payer: Self-pay | Admitting: Interventional Cardiology

## 2018-02-16 DIAGNOSIS — I7381 Erythromelalgia: Secondary | ICD-10-CM | POA: Diagnosis not present

## 2018-02-16 DIAGNOSIS — E790 Hyperuricemia without signs of inflammatory arthritis and tophaceous disease: Secondary | ICD-10-CM | POA: Diagnosis not present

## 2018-02-16 DIAGNOSIS — Z6841 Body Mass Index (BMI) 40.0 and over, adult: Secondary | ICD-10-CM | POA: Diagnosis not present

## 2018-02-16 NOTE — Telephone Encounter (Signed)
New Messagse      Patient called stated that she can not do the treadmill, she gets a shot to speed up her heart. Please call to advise, not sure if we need to cancel this appt. Or not

## 2018-02-16 NOTE — Telephone Encounter (Signed)
Left message on voicemail per DPR in reference to upcoming appointment scheduled on 02/23/18 at 1230 with detailed instructions given per Myocardial Perfusion Study Information Sheet for the test. LM to arrive 15 minutes early, and that it is imperative to arrive on time for appointment to keep from having the test rescheduled. If you need to cancel or reschedule your appointment, please call the office within 24 hours of your appointment. Failure to do so may result in a cancellation of your appointment, and a $50 no show fee. Phone number given for call back for any questions. Lydell Moga, Ranae Palms

## 2018-02-16 NOTE — Telephone Encounter (Signed)
Spoke with patient and explained that she is scheduled for a lexiscan stress test. She expressed understanding and had no further questions.

## 2018-02-23 ENCOUNTER — Ambulatory Visit (HOSPITAL_COMMUNITY): Payer: Medicare Other | Attending: Cardiology

## 2018-02-23 DIAGNOSIS — I700 Atherosclerosis of aorta: Secondary | ICD-10-CM | POA: Diagnosis not present

## 2018-02-23 DIAGNOSIS — I2510 Atherosclerotic heart disease of native coronary artery without angina pectoris: Secondary | ICD-10-CM | POA: Diagnosis not present

## 2018-02-23 MED ORDER — REGADENOSON 0.4 MG/5ML IV SOLN
0.4000 mg | Freq: Once | INTRAVENOUS | Status: AC
  Administered 2018-02-23: 0.4 mg via INTRAVENOUS

## 2018-02-23 MED ORDER — TECHNETIUM TC 99M TETROFOSMIN IV KIT
32.5000 | PACK | Freq: Once | INTRAVENOUS | Status: AC | PRN
  Administered 2018-02-23: 32.5 via INTRAVENOUS
  Filled 2018-02-23: qty 33

## 2018-02-24 ENCOUNTER — Ambulatory Visit (HOSPITAL_COMMUNITY): Payer: Medicare Other | Attending: Cardiovascular Disease

## 2018-02-24 LAB — MYOCARDIAL PERFUSION IMAGING
LV dias vol: 68 mL (ref 46–106)
LV sys vol: 23 mL
Peak HR: 96 {beats}/min
RATE: 0.33
Rest HR: 89 {beats}/min
SDS: 4
SRS: 7
SSS: 11
TID: 0.99

## 2018-02-24 MED ORDER — TECHNETIUM TC 99M TETROFOSMIN IV KIT
31.8000 | PACK | Freq: Once | INTRAVENOUS | Status: AC | PRN
  Administered 2018-02-24: 31.8 via INTRAVENOUS
  Filled 2018-02-24: qty 32

## 2018-02-26 ENCOUNTER — Telehealth: Payer: Self-pay | Admitting: Interventional Cardiology

## 2018-02-26 NOTE — Telephone Encounter (Signed)
New Message ° ° ° ° ° °Patient returned your call, pls call again. °

## 2018-02-26 NOTE — Telephone Encounter (Signed)
Informed pt of results. Pt verbalized understanding. 

## 2018-03-29 DIAGNOSIS — R5383 Other fatigue: Secondary | ICD-10-CM | POA: Diagnosis not present

## 2018-03-29 DIAGNOSIS — E559 Vitamin D deficiency, unspecified: Secondary | ICD-10-CM | POA: Diagnosis not present

## 2018-03-29 DIAGNOSIS — M810 Age-related osteoporosis without current pathological fracture: Secondary | ICD-10-CM | POA: Diagnosis not present

## 2018-04-06 DIAGNOSIS — E559 Vitamin D deficiency, unspecified: Secondary | ICD-10-CM | POA: Diagnosis not present

## 2018-04-06 DIAGNOSIS — M810 Age-related osteoporosis without current pathological fracture: Secondary | ICD-10-CM | POA: Diagnosis not present

## 2018-04-12 DIAGNOSIS — Z23 Encounter for immunization: Secondary | ICD-10-CM | POA: Diagnosis not present

## 2018-06-14 IMAGING — US US RENAL
1 series · 14 of 25 positions shown · non-contrast
Comparison: Abdominal ultrasound dated December 08, 2007

CLINICAL DATA: Elevated creatinine, history of hypertension and
diabetes.

EXAM:
RENAL / URINARY TRACT ULTRASOUND COMPLETE

[Series 1: us renal · 0.25mm/px · 14 of 34 slices shown]
[im 1/34]
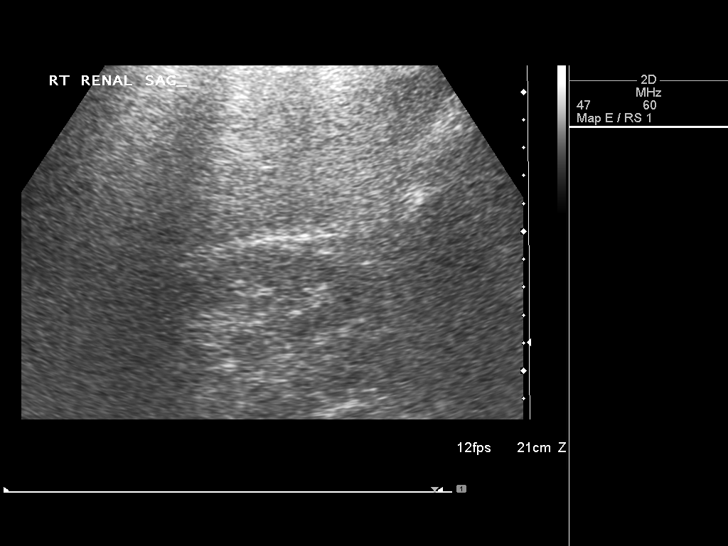
[im 3/34]
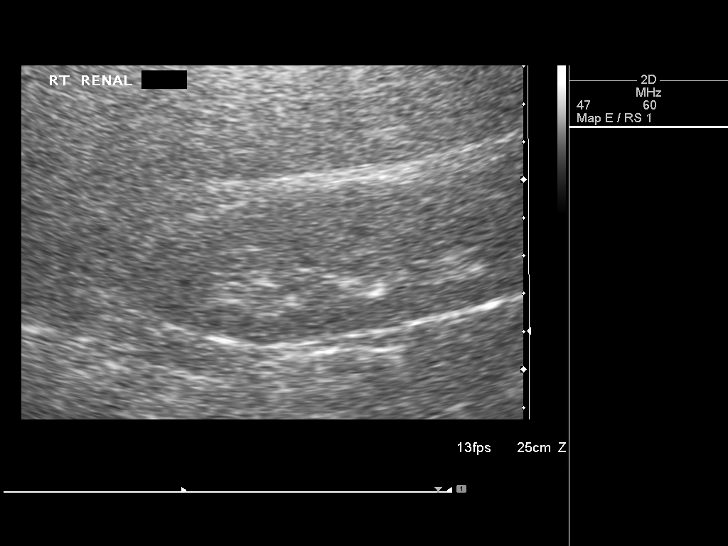
[im 6/34]
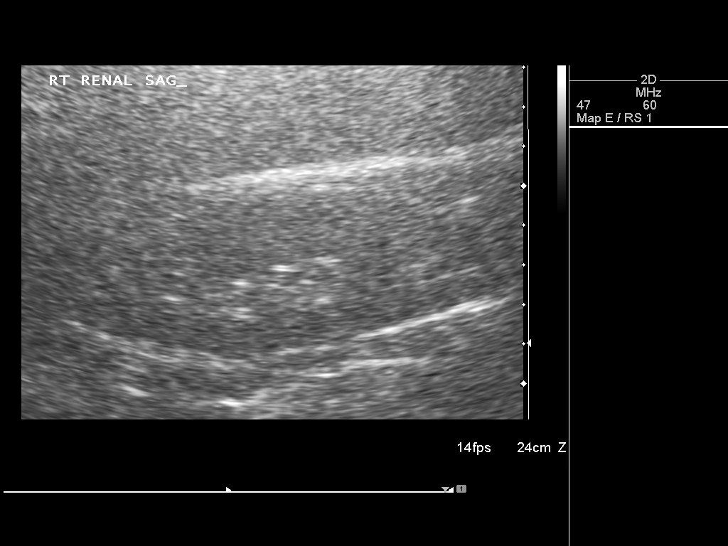
[im 9/34]
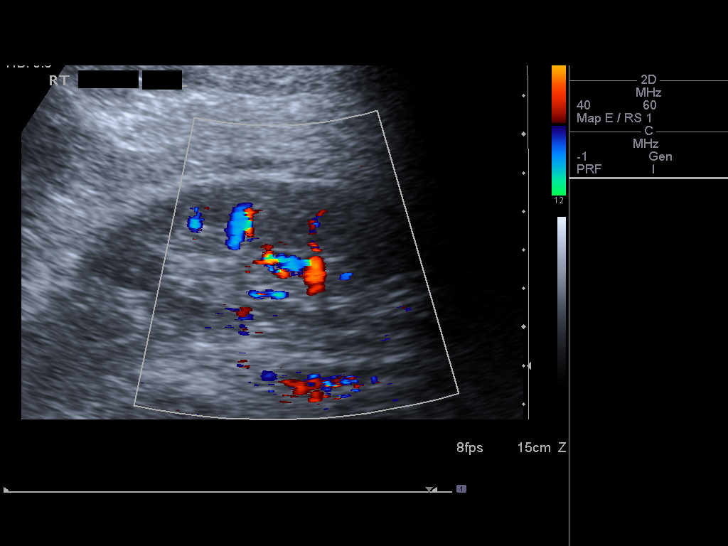
[im 12/34]
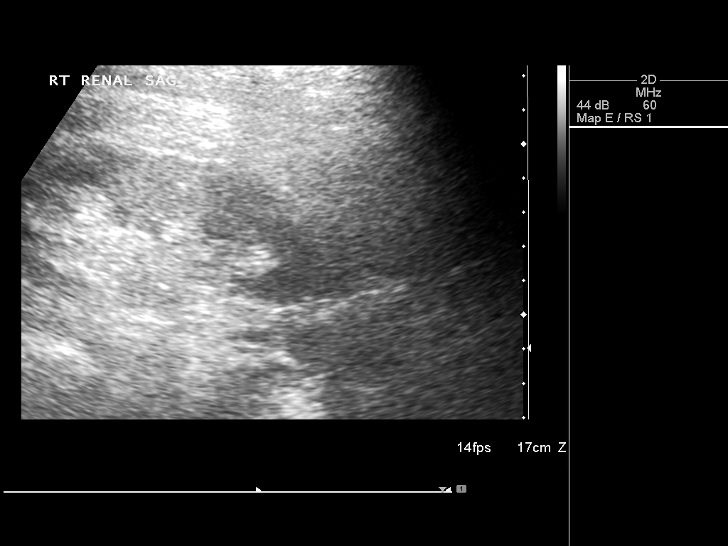
[im 13/34]
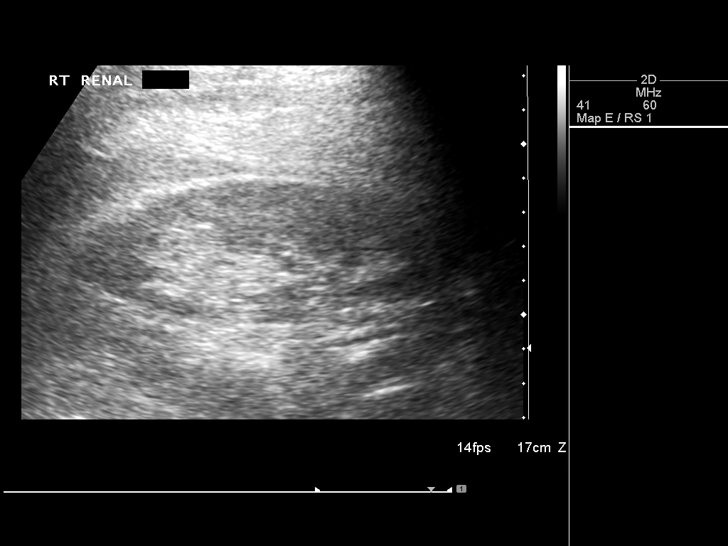
[im 16/34]
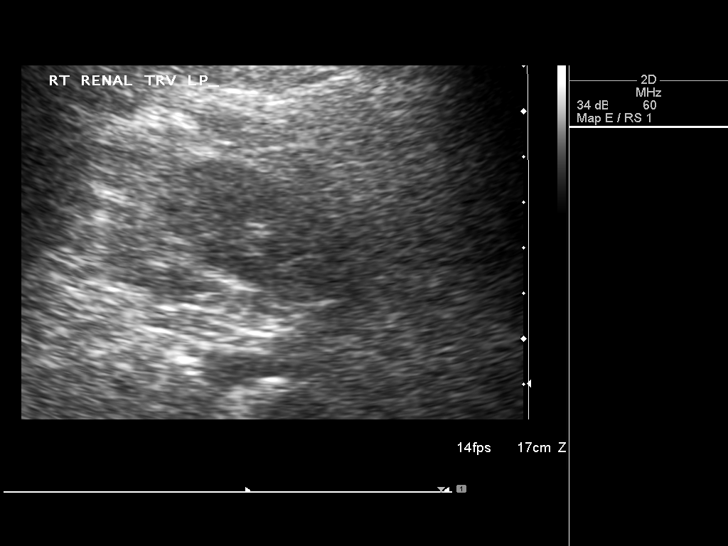
[im 18/34]
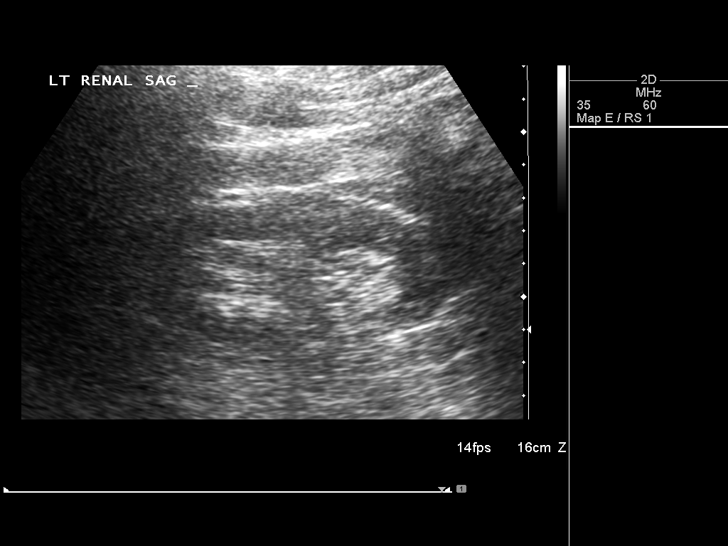
[im 21/34]
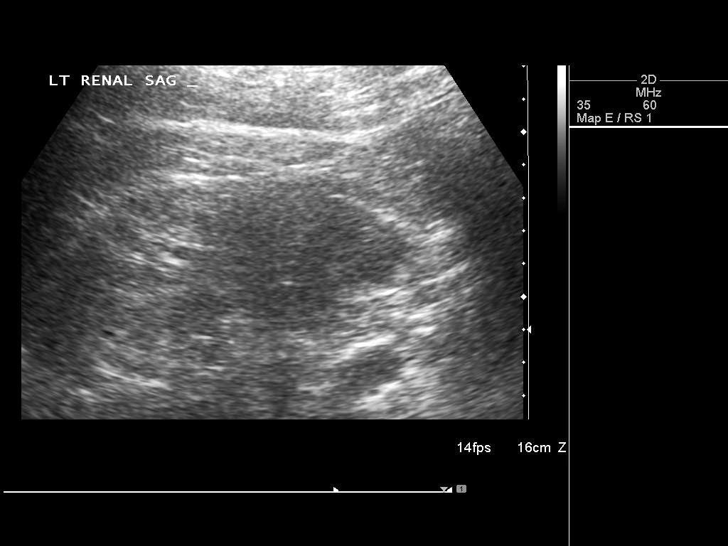
[im 23/34]
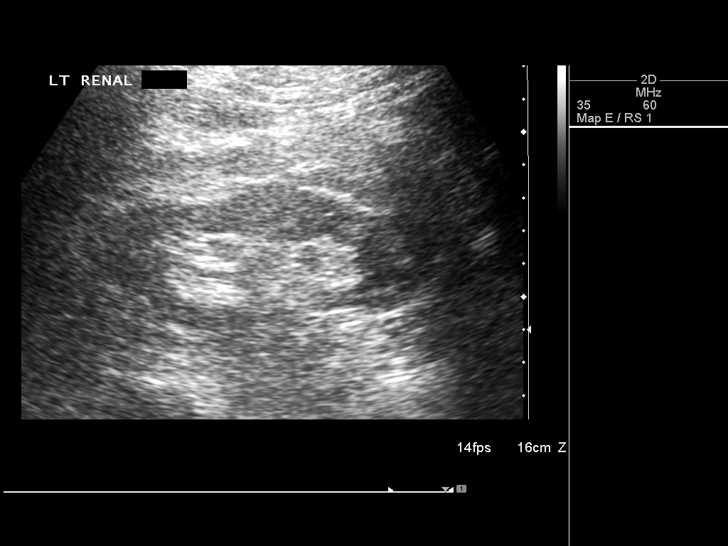
[im 25/34]
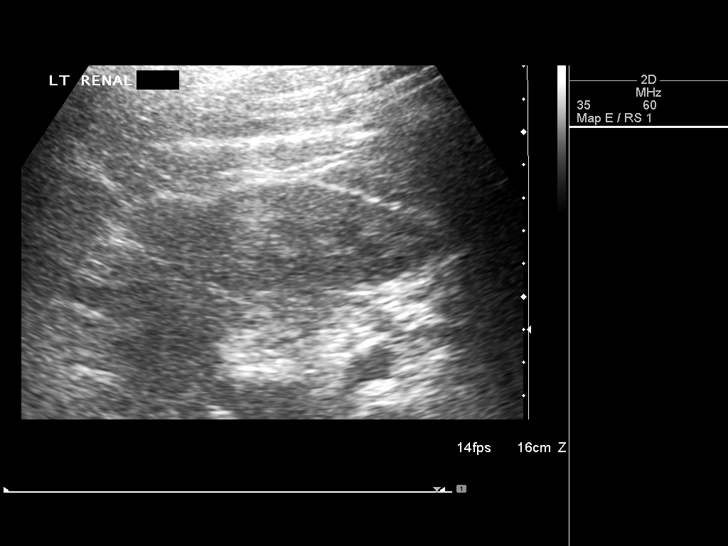
[im 28/34]
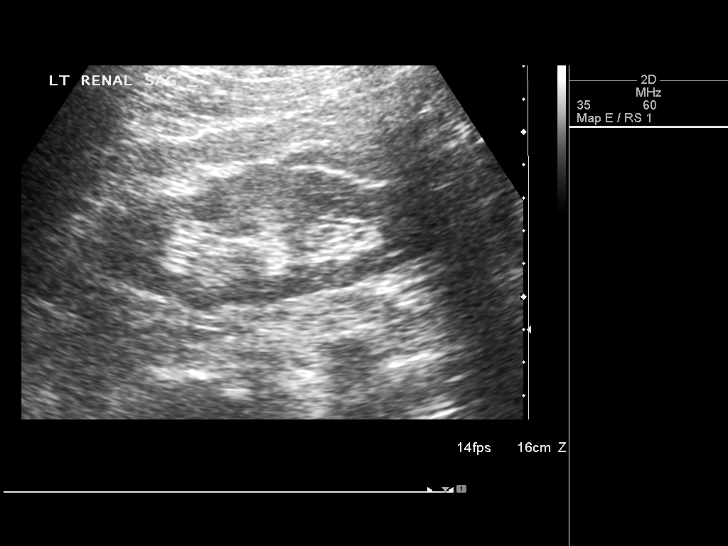
[im 31/34]
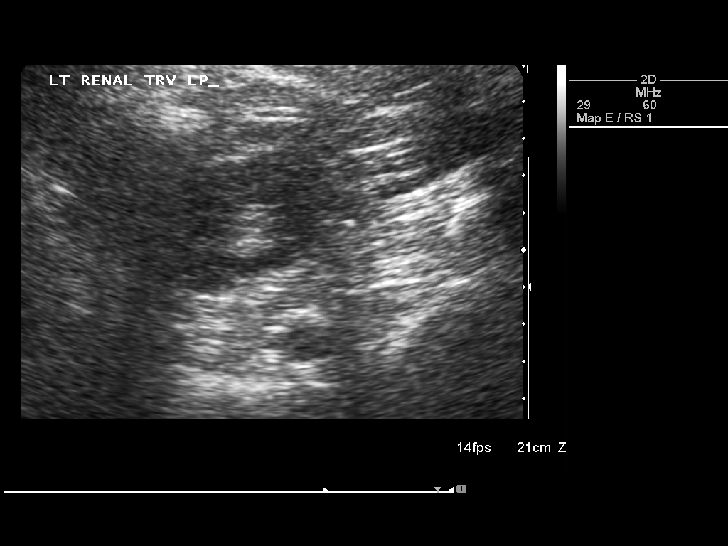
[im 34/34]
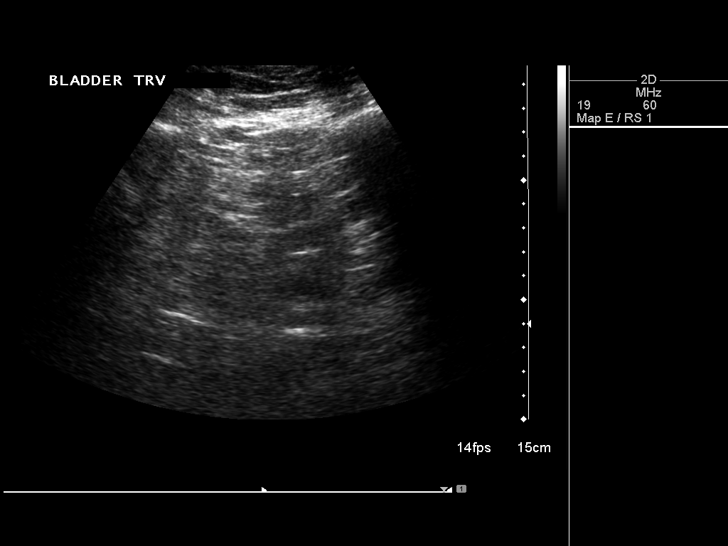

[14 of 25 positions shown; findings below may reference images not displayed]

FINDINGS: Right Kidney:

Length: 11.0 cm. The renal cortical echotexture remains lower than
that of the adjacent liver. There is no focal mass nor
hydronephrosis.

Left Kidney:

Length: At 10.6 cm. The renal cortical echotexture is similar to
that of the right kidney. There is no focal mass nor hydronephrosis.

Bladder:

The urinary bladder is empty.
IMPRESSION: No evidence of obstruction. The renal cortical echotexture is
normal.

## 2018-08-19 DIAGNOSIS — I7381 Erythromelalgia: Secondary | ICD-10-CM | POA: Diagnosis not present

## 2018-08-19 DIAGNOSIS — M25552 Pain in left hip: Secondary | ICD-10-CM | POA: Diagnosis not present

## 2018-08-19 DIAGNOSIS — E790 Hyperuricemia without signs of inflammatory arthritis and tophaceous disease: Secondary | ICD-10-CM | POA: Diagnosis not present

## 2018-08-19 DIAGNOSIS — Z6841 Body Mass Index (BMI) 40.0 and over, adult: Secondary | ICD-10-CM | POA: Diagnosis not present

## 2018-09-01 DIAGNOSIS — Z87891 Personal history of nicotine dependence: Secondary | ICD-10-CM | POA: Diagnosis not present

## 2018-09-01 DIAGNOSIS — I10 Essential (primary) hypertension: Secondary | ICD-10-CM | POA: Diagnosis not present

## 2018-09-01 DIAGNOSIS — E7800 Pure hypercholesterolemia, unspecified: Secondary | ICD-10-CM | POA: Diagnosis not present

## 2018-09-01 DIAGNOSIS — Z1389 Encounter for screening for other disorder: Secondary | ICD-10-CM | POA: Diagnosis not present

## 2018-09-01 DIAGNOSIS — E1165 Type 2 diabetes mellitus with hyperglycemia: Secondary | ICD-10-CM | POA: Diagnosis not present

## 2018-09-01 DIAGNOSIS — I2510 Atherosclerotic heart disease of native coronary artery without angina pectoris: Secondary | ICD-10-CM | POA: Diagnosis not present

## 2018-09-01 DIAGNOSIS — L989 Disorder of the skin and subcutaneous tissue, unspecified: Secondary | ICD-10-CM | POA: Diagnosis not present

## 2018-09-01 DIAGNOSIS — E790 Hyperuricemia without signs of inflammatory arthritis and tophaceous disease: Secondary | ICD-10-CM | POA: Diagnosis not present

## 2018-09-01 DIAGNOSIS — E1143 Type 2 diabetes mellitus with diabetic autonomic (poly)neuropathy: Secondary | ICD-10-CM | POA: Diagnosis not present

## 2018-09-01 DIAGNOSIS — Z0000 Encounter for general adult medical examination without abnormal findings: Secondary | ICD-10-CM | POA: Diagnosis not present

## 2018-09-01 DIAGNOSIS — M810 Age-related osteoporosis without current pathological fracture: Secondary | ICD-10-CM | POA: Diagnosis not present

## 2018-09-01 DIAGNOSIS — E559 Vitamin D deficiency, unspecified: Secondary | ICD-10-CM | POA: Diagnosis not present

## 2018-09-01 DIAGNOSIS — N183 Chronic kidney disease, stage 3 (moderate): Secondary | ICD-10-CM | POA: Diagnosis not present

## 2018-09-03 ENCOUNTER — Other Ambulatory Visit: Payer: Self-pay | Admitting: Internal Medicine

## 2018-09-03 DIAGNOSIS — F17200 Nicotine dependence, unspecified, uncomplicated: Secondary | ICD-10-CM

## 2018-09-03 DIAGNOSIS — Z0000 Encounter for general adult medical examination without abnormal findings: Secondary | ICD-10-CM

## 2018-09-08 ENCOUNTER — Other Ambulatory Visit: Payer: Self-pay | Admitting: Internal Medicine

## 2018-09-08 ENCOUNTER — Other Ambulatory Visit: Payer: Self-pay | Admitting: Interventional Cardiology

## 2018-09-08 DIAGNOSIS — Z87891 Personal history of nicotine dependence: Secondary | ICD-10-CM

## 2018-09-08 DIAGNOSIS — B359 Dermatophytosis, unspecified: Secondary | ICD-10-CM | POA: Diagnosis not present

## 2018-09-08 DIAGNOSIS — Z0000 Encounter for general adult medical examination without abnormal findings: Secondary | ICD-10-CM

## 2018-09-08 DIAGNOSIS — R61 Generalized hyperhidrosis: Secondary | ICD-10-CM | POA: Diagnosis not present

## 2018-09-08 DIAGNOSIS — M25552 Pain in left hip: Secondary | ICD-10-CM | POA: Diagnosis not present

## 2018-09-14 ENCOUNTER — Ambulatory Visit
Admission: RE | Admit: 2018-09-14 | Discharge: 2018-09-14 | Disposition: A | Discharge status: Home / Self Care | Payer: Medicare Other | Source: Ambulatory Visit | Attending: Internal Medicine | Admitting: Internal Medicine

## 2018-09-14 ENCOUNTER — Other Ambulatory Visit: Payer: Self-pay | Admitting: Internal Medicine

## 2018-09-14 DIAGNOSIS — Z87891 Personal history of nicotine dependence: Secondary | ICD-10-CM

## 2018-09-14 DIAGNOSIS — R911 Solitary pulmonary nodule: Secondary | ICD-10-CM

## 2018-09-14 DIAGNOSIS — R918 Other nonspecific abnormal finding of lung field: Secondary | ICD-10-CM | POA: Diagnosis not present

## 2018-09-14 DIAGNOSIS — Z0000 Encounter for general adult medical examination without abnormal findings: Secondary | ICD-10-CM

## 2018-10-08 ENCOUNTER — Other Ambulatory Visit: Payer: Self-pay | Admitting: Interventional Cardiology

## 2018-10-08 MED ORDER — ROSUVASTATIN CALCIUM 5 MG PO TABS: 5.0000 mg | ORAL_TABLET | Freq: Every day | ORAL | 1 refills | Status: AC

## 2018-10-26 DIAGNOSIS — E559 Vitamin D deficiency, unspecified: Secondary | ICD-10-CM | POA: Diagnosis not present

## 2018-10-26 DIAGNOSIS — M810 Age-related osteoporosis without current pathological fracture: Secondary | ICD-10-CM | POA: Diagnosis not present

## 2018-11-05 DIAGNOSIS — N951 Menopausal and female climacteric states: Secondary | ICD-10-CM | POA: Diagnosis not present

## 2018-11-05 DIAGNOSIS — Z6841 Body Mass Index (BMI) 40.0 and over, adult: Secondary | ICD-10-CM | POA: Diagnosis not present

## 2018-11-05 DIAGNOSIS — E669 Obesity, unspecified: Secondary | ICD-10-CM | POA: Diagnosis not present

## 2019-01-03 DIAGNOSIS — Z8739 Personal history of other diseases of the musculoskeletal system and connective tissue: Secondary | ICD-10-CM | POA: Diagnosis not present

## 2019-01-03 DIAGNOSIS — I10 Essential (primary) hypertension: Secondary | ICD-10-CM | POA: Diagnosis not present

## 2019-01-03 DIAGNOSIS — R200 Anesthesia of skin: Secondary | ICD-10-CM | POA: Diagnosis not present

## 2019-01-03 DIAGNOSIS — E1143 Type 2 diabetes mellitus with diabetic autonomic (poly)neuropathy: Secondary | ICD-10-CM | POA: Diagnosis not present

## 2019-01-03 DIAGNOSIS — N183 Chronic kidney disease, stage 3 (moderate): Secondary | ICD-10-CM | POA: Diagnosis not present

## 2019-01-03 DIAGNOSIS — E7800 Pure hypercholesterolemia, unspecified: Secondary | ICD-10-CM | POA: Diagnosis not present

## 2019-01-03 DIAGNOSIS — I2510 Atherosclerotic heart disease of native coronary artery without angina pectoris: Secondary | ICD-10-CM | POA: Diagnosis not present

## 2019-01-03 DIAGNOSIS — K219 Gastro-esophageal reflux disease without esophagitis: Secondary | ICD-10-CM | POA: Diagnosis not present

## 2019-02-02 ENCOUNTER — Telehealth: Payer: Self-pay | Admitting: Neurology

## 2019-02-02 ENCOUNTER — Other Ambulatory Visit: Payer: Self-pay

## 2019-02-02 ENCOUNTER — Ambulatory Visit (INDEPENDENT_AMBULATORY_CARE_PROVIDER_SITE_OTHER): Payer: Medicare Other | Admitting: Neurology

## 2019-02-02 ENCOUNTER — Encounter: Payer: Self-pay | Admitting: Neurology

## 2019-02-02 VITALS — BP 120/72 | HR 79 | Temp 98.5°F | Ht 65.0 in | Wt 239.0 lb

## 2019-02-02 DIAGNOSIS — R3915 Urgency of urination: Secondary | ICD-10-CM | POA: Diagnosis not present

## 2019-02-02 DIAGNOSIS — G3281 Cerebellar ataxia in diseases classified elsewhere: Secondary | ICD-10-CM

## 2019-02-02 DIAGNOSIS — M545 Low back pain, unspecified: Secondary | ICD-10-CM | POA: Insufficient documentation

## 2019-02-02 DIAGNOSIS — R269 Unspecified abnormalities of gait and mobility: Secondary | ICD-10-CM | POA: Insufficient documentation

## 2019-02-02 DIAGNOSIS — G6289 Other specified polyneuropathies: Secondary | ICD-10-CM

## 2019-02-02 DIAGNOSIS — G629 Polyneuropathy, unspecified: Secondary | ICD-10-CM | POA: Insufficient documentation

## 2019-02-02 DIAGNOSIS — G8929 Other chronic pain: Secondary | ICD-10-CM | POA: Insufficient documentation
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
## 2019-02-02 NOTE — Telephone Encounter (Signed)
Medicare/aarp order sent to [facility]. No auth they will reach out to the patient to schedule.  

## 2019-02-04 LAB — PROTEIN ELECTROPHORESIS
A/G Ratio: 1.3 (ref 0.7–1.7)
Albumin ELP: 3.9 g/dL (ref 2.9–4.4)
Alpha 1: 0.2 g/dL (ref 0.0–0.4)
Alpha 2: 1 g/dL (ref 0.4–1.0)
Beta: 1 g/dL (ref 0.7–1.3)
Gamma Globulin: 0.8 g/dL (ref 0.4–1.8)
Globulin, Total: 3.1 g/dL (ref 2.2–3.9)
Total Protein: 7 g/dL (ref 6.0–8.5)

## 2019-02-04 LAB — METHYLMALONIC ACID, SERUM: Methylmalonic Acid: 293 nmol/L (ref 0–378)

## 2019-02-04 LAB — FOLATE: Folate: >20 ng/mL (ref >3.0)

## 2019-02-04 LAB — ANA W/REFLEX IF POSITIVE: Anti Nuclear Antibody (ANA): NEGATIVE

## 2019-02-04 LAB — VITAMIN B12: Vitamin B-12: 481 pg/mL (ref 232–1245)

## 2019-02-04 LAB — HOMOCYSTEINE: Homocysteine: 10.4 umol/L (ref 0.0–19.2)

## 2019-02-04 LAB — RPR: RPR Ser Ql: NONREACTIVE

## 2019-02-04 LAB — SEDIMENTATION RATE: Sed Rate: 45 mm/hr — ABNORMAL HIGH (ref 0–40)

## 2019-02-04 LAB — CK: Total CK: 142 U/L (ref 32–182)

## 2019-02-04 LAB — C-REACTIVE PROTEIN: CRP: 13 mg/L — ABNORMAL HIGH (ref 0–10)

## 2019-02-07 ENCOUNTER — Telehealth: Payer: Self-pay | Admitting: Neurology

## 2019-02-07 NOTE — Telephone Encounter (Signed)
Please call patient, laboratory evaluations showed mild elevated ESR, C-reactive protein, both are only slightly above normal range, have unknown clinical significance.  Rest of the laboratory evaluations were normal.

## 2019-02-07 NOTE — Telephone Encounter (Signed)
Called the patient and there was no answer. LVM informing the pt of the lab findings per Dr Krista Blue. Advised if she had questions call back.

## 2019-02-21 ENCOUNTER — Ambulatory Visit: Payer: Medicare Other | Admitting: Physical Therapy

## 2019-02-23 ENCOUNTER — Encounter: Payer: Self-pay | Admitting: Physical Therapy

## 2019-02-23 ENCOUNTER — Other Ambulatory Visit: Payer: Self-pay

## 2019-02-23 ENCOUNTER — Ambulatory Visit: Payer: Medicare Other | Attending: Neurology | Admitting: Physical Therapy

## 2019-02-23 DIAGNOSIS — M79671 Pain in right foot: Secondary | ICD-10-CM | POA: Insufficient documentation

## 2019-02-23 DIAGNOSIS — M545 Low back pain: Secondary | ICD-10-CM | POA: Diagnosis not present

## 2019-02-23 DIAGNOSIS — M6281 Muscle weakness (generalized): Secondary | ICD-10-CM | POA: Diagnosis not present

## 2019-02-23 DIAGNOSIS — G8929 Other chronic pain: Secondary | ICD-10-CM | POA: Diagnosis not present

## 2019-02-23 DIAGNOSIS — R2689 Other abnormalities of gait and mobility: Secondary | ICD-10-CM | POA: Diagnosis not present
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
## 2019-02-28 ENCOUNTER — Ambulatory Visit: Payer: Medicare Other | Admitting: Physical Therapy

## 2019-02-28 ENCOUNTER — Other Ambulatory Visit: Payer: Self-pay

## 2019-02-28 DIAGNOSIS — G8929 Other chronic pain: Secondary | ICD-10-CM | POA: Diagnosis not present

## 2019-02-28 DIAGNOSIS — M545 Low back pain: Secondary | ICD-10-CM | POA: Diagnosis not present

## 2019-02-28 DIAGNOSIS — M79671 Pain in right foot: Secondary | ICD-10-CM | POA: Diagnosis not present

## 2019-02-28 DIAGNOSIS — M6281 Muscle weakness (generalized): Secondary | ICD-10-CM | POA: Diagnosis not present

## 2019-02-28 DIAGNOSIS — R2689 Other abnormalities of gait and mobility: Secondary | ICD-10-CM | POA: Diagnosis not present

## 2019-02-28 NOTE — Patient Instructions (Signed)
Access Code: [email or web address]  URL: [email or web address]  Date: 02/28/2019  Prepared by: Elsie Ra   Exercises  Seated Gastroc Stretch with Strap - 2-3 sets - 30 hold - 2x daily - 6x weekly  Seated Toe Curl - 10 reps - 3 sets - 2x daily - 6x weekly  Heel Toe Raises with Counter Support - 10 reps - 2 sets - 2x daily - 6x weekly  Sit to Stand - 5 reps - 2-3 sets - 2x daily - 6x weekly  Backward Walking with Counter Support - 3-5 reps - 1 sets - 2x daily - 6x weekly  Side Stepping with Counter Support - 3-5 reps - 1 sets - 2x daily - 6x weekly  Walking with Head Rotation - 3 reps - 1 sets - 2x daily - 6x weekly  Walking with Head Nod - 3 reps - 1 sets - 2x daily - 6x weekly  Standing Balance in Corner with Eyes Closed - 2-3 reps - 30 sec hold - 2x daily - 6x weekly  Romberg Stance - 3 reps - 1 sets - 30 hold - 2x daily - 6x weekly

## 2019-02-28 NOTE — Therapy (Signed)
[facility] [address] [place], [place], [address] Phone: [phone]   Fax:  [phone]  Physical Therapy Treatment  Patient Details  Name: Pamela Snyder MRN: [id number] Date of Birth: [birth year] Referring Provider (PT): Marcial Pacas, MD   Encounter Date: 02/28/2019  PT End of Session - 02/28/19 1454    Visit Number  2    Number of Visits  16    Date for PT Re-Evaluation  04/20/19    Authorization Type  MCR    PT Start Time  [id number]    PT Stop Time  1445    PT Time Calculation (min)  50 min    Equipment Utilized During Treatment  Gait belt    Activity Tolerance  Patient tolerated treatment well       Past Medical History:  Diagnosis Date  . Arthritis    "right foot; spine; hands" (11/23/2014)  . Charcot foot due to diabetes mellitus ([place])   . GERD (gastroesophageal reflux disease)   . Gout   . Hyperlipemia   . Hypertension   . Migraine    hx  . Neuropathy   . Numbness   . Thyroid goiter 1986  . Type II diabetes mellitus ([place])     Past Surgical History:  Procedure Laterality Date  . ABDOMINAL HYSTERECTOMY  1999   w/BSO  . ACHILLES TENDON LENGTHENING Right 11/23/2014  . ACHILLES TENDON LENGTHENING Right 11/23/2014   Procedure: RIGHT ACHILLES PERCUTANEOUS TENDON LENGTHENING;  Surgeon: Wylene Simmer, MD;  Location: [facility];  Service: Orthopedics;  Laterality: Right;  . APPENDECTOMY  1963  . ARTHRODESIS Right 11/23/2014   mid foot  . CARDIAC CATHETERIZATION  08/2004  . FOOT ARTHRODESIS Right 11/23/2014   Procedure: RIGHT MID FOOT ARTHRODESIS;  Surgeon: Wylene Simmer, MD;  Location: [facility];  Service: Orthopedics;  Laterality: Right;  . METATARSAL OSTEOTOMY Right 11/23/2014   Procedure: RIGHT MID FOOT OSTEOTOMY;  Surgeon: Wylene Simmer, MD;  Location: [facility];  Service: Orthopedics;  Laterality: Right;  . OSTEOTOMY Right 11/23/2014   mid foot  . PARTIAL THYMECTOMY  1986   ? side  . TONSILLECTOMY  1954    There were no  vitals filed for this visit.      [facility] PT Assessment - 02/28/19 0001      Assessment   Medical Diagnosis  Ataxia, gait, Chronic LBP, Rt foot pain, balance    Referring Provider (PT)  Marcial Pacas, MD      Standardized Balance Assessment   Standardized Balance Assessment  Timed Up and Go Test;10 meter walk test      Timed Up and Go Test   Normal TUG (seconds)  17     10MWT       1.66 ft/sec     [facility] Adult PT Treatment/Exercise - 02/28/19 0001      Transfers   Sit to Stand  5: Supervision    Sit to Stand Details (indicate cue type and reason)  2X 5 reps from low mat table      Ambulation/Gait   Ambulation/Gait  Yes    Ambulation/Gait Assistance  5: Supervision    Ambulation Distance (Feet)  100 Feet   X 3   Assistive device  Straight cane    Gait Pattern  Decreased step length - right;Decreased step length - left;Decreased dorsiflexion - right;Decreased dorsiflexion - left;Ataxic    Gait velocity  1.66 ft/sec with 10MWT        Exercises  Seated  Gastroc Stretch with Strap - 3 sets - 30 hold  Seated Toe Curl - 10 reps - 2 sets   Heel Toe Raises with Counter Support - 10 reps - performed better one leg at a time  Sit to Stand - 5 reps - 2 sets - 2x daily  Backward Walking with Counter Support - 2 reps  Side Stepping with Counter Support 2 reps Walking with Head Rotation - 2 reps Walking with Head Nod - 2 reps Standing Balance in Corner with Eyes Closed with chair in front - 10 sec X 5 reps Romberg Stance  In corner with chair in front- 1 min        PT Education - 02/28/19 1454    Education Details  HEP    Person(s) Educated  Patient    Methods  Explanation;Verbal cues;Demonstration;Handout    Comprehension  Verbalized understanding;Returned demonstration;Need further instruction       PT Short Term Goals - 02/28/19 1506      PT SHORT TERM GOAL #1   Title  Pt will be I and compliant with HEP (all STG 4 weeks 03/27/19)    Status  On-going      PT SHORT  TERM GOAL #2   Title  Pt will improve BERG balance at least 3 points to show improved balance    Baseline  15 no AD    Status  On-going      PT SHORT TERM GOAL #3   Title  Pt will improve TUG score with LRAD by >2 seconds to show improved balance and gait speed    Baseline  16    Status  New        PT Long Term Goals - 02/28/19 1507      PT LONG TERM GOAL #1   Title  Pt will improve BERG balance without AD to >21 to show improved balance. (Time for all LTG 8 weeks 04/20/19)    Baseline  15    Status  On-going      PT LONG TERM GOAL #2   Title  Pt will improve gait speed and TUG time to show improved gait and balance    Baseline  16 sec TUG and 1.66 ft/sec    Status  On-going      PT LONG TERM GOAL #3   Title  Pt will be able to easier negotiate stairs while carrying something in one hand.    Status  On-going            Plan - 02/28/19 1455    Clinical Impression Statement  Session focused on designing and implementing initial HEP for LE strength and balance. She was able to show good return demonstration but may need reinforcement next session for better carryover. She will continue to need LE strength, balance, and gait training to improve function and reduce risk of falling.    Personal Factors and Comorbidities  Comorbidity 1;Comorbidity 2;Comorbidity 3+    Comorbidities  PMH diabetes with neuropathy, obesity, hypertension, hyperlipidemia, osteoporosis,Rt foot surgery for midfoot osteotomy, arthrodesis and achilles lengthening in 2016.    Examination-Activity Limitations  Bend;Locomotion Level;Carry;Stairs;Stand;Lift    Examination-Participation Restrictions  Meal Prep;Cleaning;Community Activity;Driving;Laundry    Stability/Clinical Decision Making  Evolving/Moderate complexity    Rehab Potential  Good    PT Frequency  2x / week    PT Duration  8 weeks    PT Treatment/Interventions  ADLs/Self Care Home Management;Aquatic Therapy;Cryotherapy;Moist  Heat;Traction;Ultrasound;Gait training;Functional mobility training;Therapeutic activities;Therapeutic exercise;Balance training;Neuromuscular  re-education;Orthotic Fit/Training;Manual techniques;Passive range of motion;Dry needling;Taping;Joint Manipulations    PT Next Visit Plan  review HEP for LE strength and beginner balance, progress this and gait as able, work on stairs while carrying something in one hand    PT Home Exercise Plan  Access Code: [email or web address]    Consulted and Agree with Plan of Care  Patient       Patient will benefit from skilled therapeutic intervention in order to improve the following deficits and impairments:  Abnormal gait, Decreased activity tolerance, Decreased balance, Decreased coordination, Decreased range of motion, Decreased strength, Difficulty walking, Impaired flexibility, Postural dysfunction, Pain, Obesity  Visit Diagnosis: Other abnormalities of gait and mobility  Chronic bilateral low back pain without sciatica  Muscle weakness (generalized)  Pain in right foot     Problem List Patient Active Problem List   Diagnosis Date Noted  . Peripheral neuropathy 02/02/2019  . Gait abnormality 02/02/2019  . Urinary urgency 02/02/2019  . Chronic bilateral low back pain without sciatica 02/02/2019  . DOE (dyspnea on exertion) 10/05/2017  . Bruit of right carotid artery 10/05/2017  . Coronary artery calcification seen on CT scan 10/05/2017  . Chest pain 10/04/2017  . Aortic atherosclerosis ([place]) 10/04/2017  . Charcot foot due to diabetes mellitus ([place]) 11/23/2014    Silvestre Mesi 02/28/2019, 3:13 PM  [facility] [address] [place], [place], [address] Phone: [phone]   Fax:  [phone]  Name: Pamela Snyder MRN: [id number] Date of Birth: 1946-05-08

## 2019-03-02 ENCOUNTER — Ambulatory Visit: Payer: Medicare Other | Admitting: Physical Therapy

## 2019-03-02 ENCOUNTER — Other Ambulatory Visit: Payer: Self-pay

## 2019-03-02 DIAGNOSIS — G8929 Other chronic pain: Secondary | ICD-10-CM | POA: Diagnosis not present

## 2019-03-02 DIAGNOSIS — M6281 Muscle weakness (generalized): Secondary | ICD-10-CM | POA: Diagnosis not present

## 2019-03-02 DIAGNOSIS — M545 Low back pain: Secondary | ICD-10-CM | POA: Diagnosis not present

## 2019-03-02 DIAGNOSIS — R2689 Other abnormalities of gait and mobility: Secondary | ICD-10-CM | POA: Diagnosis not present

## 2019-03-02 DIAGNOSIS — M79671 Pain in right foot: Secondary | ICD-10-CM | POA: Diagnosis not present

## 2019-03-02 NOTE — Therapy (Signed)
[facility] [address] [place], [place], [address] Phone: [phone]   Fax:  [phone]  Physical Therapy Treatment  Patient Details  Name: Pamela Snyder MRN: [id number] Date of Birth: [birth year] Referring Provider (PT): Marcial Pacas, MD   Encounter Date: 03/02/2019  PT End of Session - 03/02/19 1709    Visit Number  3    Number of Visits  16    Date for PT Re-Evaluation  04/20/19    Authorization Type  MCR    PT Start Time  1445    PT Stop Time  1526    PT Time Calculation (min)  41 min    Equipment Utilized During Treatment  Gait belt    Activity Tolerance  Patient tolerated treatment well    Behavior During Therapy  [facility] for tasks assessed/performed       Past Medical History:  Diagnosis Date  . Arthritis    "right foot; spine; hands" (11/23/2014)  . Charcot foot due to diabetes mellitus ([place])   . GERD (gastroesophageal reflux disease)   . Gout   . Hyperlipemia   . Hypertension   . Migraine    hx  . Neuropathy   . Numbness   . Thyroid goiter 1986  . Type II diabetes mellitus ([place])     Past Surgical History:  Procedure Laterality Date  . ABDOMINAL HYSTERECTOMY  1999   w/BSO  . ACHILLES TENDON LENGTHENING Right 11/23/2014  . ACHILLES TENDON LENGTHENING Right 11/23/2014   Procedure: RIGHT ACHILLES PERCUTANEOUS TENDON LENGTHENING;  Surgeon: Wylene Simmer, MD;  Location: [facility];  Service: Orthopedics;  Laterality: Right;  . APPENDECTOMY  1963  . ARTHRODESIS Right 11/23/2014   mid foot  . CARDIAC CATHETERIZATION  08/2004  . FOOT ARTHRODESIS Right 11/23/2014   Procedure: RIGHT MID FOOT ARTHRODESIS;  Surgeon: Wylene Simmer, MD;  Location: [facility];  Service: Orthopedics;  Laterality: Right;  . METATARSAL OSTEOTOMY Right 11/23/2014   Procedure: RIGHT MID FOOT OSTEOTOMY;  Surgeon: Wylene Simmer, MD;  Location: [facility];  Service: Orthopedics;  Laterality: Right;  . OSTEOTOMY Right 11/23/2014   mid foot  . PARTIAL  THYMECTOMY  1986   ? side  . TONSILLECTOMY  1954    There were no vitals filed for this visit.  Subjective Assessment - 03/02/19 1446    Subjective  Didn't sleep well last night. Felt great after therapy on Monday - noticed her balance felt better Tuesday morning. Has been doing her HEP at home.    Pertinent History  PMH diabetes with neuropathy, obesity, hypertension, hyperlipidemia, osteoporosis,Rt foot surgery for midfoot osteotomy, arthrodesis and achilles lengthening in 2016.    Limitations  Lifting;Standing;Walking;House hold activities;Sitting    How long can you stand comfortably?  5    How long can you walk comfortably?  3 min    Diagnostic tests  Has MRI of lumbar and neck scheduled for 03/08/19    Patient Stated Goals  be able to improve balance and walking, maybe be able to walk without cane, be able to carry things up the steps better.                       [facility] Adult PT Treatment/Exercise - 03/02/19 1457      Transfers   Sit to Stand  4: Min guard    Sit to Stand Details (indicate cue type and reason)  3 x 5 reps from low mat table, chair in front  of pt for cueing for anterior weight shifting, eccentric control, and activation of glutes and quads when standing. When fatigued pt demonstrates bracing against table with BLEs. Intermittent rest breaks taken throughout.       High Level Balance   High Level Balance Comments  Min guard for all balance activities, in corner with chair in front for balance, pt with intermitent LOB needing walls and UE support on chair to regain balance. Wide BOS in corner progressing to more narrow BOS with eyes open, pt needing intermittent UE support to keep balance       Therapeutic Activites    Therapeutic Activities  Other Therapeutic Activities      Exercises   Exercises  Other Exercises    Other Exercises   Onto long black step with BUE on counter/chair, 1 x 10 step ups B, pt reporting increased hip discomfort when stepping  up with LLE, at times had difficulty stepping off backwards with RLE requiring min guard.           Balance Exercises - 03/02/19 1512      Balance Exercises: Standing   Standing Eyes Opened  Wide (BOA);Head turns;3 reps   3 x 10 reps, head nods and head turns each    Overall Comments  --          PT Short Term Goals - 02/28/19 1506      PT SHORT TERM GOAL #1   Title  Pt will be I and compliant with HEP (all STG 4 weeks 03/27/19)    Status  On-going      PT SHORT TERM GOAL #2   Title  Pt will improve BERG balance at least 3 points to show improved balance    Baseline  15 no AD    Status  On-going      PT SHORT TERM GOAL #3   Title  Pt will improve TUG score with LRAD by >2 seconds to show improved balance and gait speed    Baseline  16    Status  New        PT Long Term Goals - 02/28/19 1507      PT LONG TERM GOAL #1   Title  Pt will improve BERG balance without AD to >21 to show improved balance. (Time for all LTG 8 weeks 04/20/19)    Baseline  15    Status  On-going      PT LONG TERM GOAL #2   Title  Pt will improve gait speed and TUG time to show improved gait and balance    Baseline  16 sec TUG and 1.66 ft/sec    Status  On-going      PT LONG TERM GOAL #3   Title  Pt will be able to easier negotiate stairs while carrying something in one hand.    Status  On-going            Plan - 03/02/19 1712    Clinical Impression Statement  Focus of today's session was sit <> stands, step ups, and corner balance. Even with eyes open and on level surface, pt demonstrated increased postural with LOB needing UE support or needing to catch self on wall, especially with head turns or more narrow BOS. Cues provided for correct technique and sequencing for sit <> stands, will need further reinforcement and practice in future sessions. Will continue to progress towards LTGs to increase strength, balance, and decrease fall risk.    Personal Factors and  Comorbidities   Comorbidity 1;Comorbidity 2;Comorbidity 3+    Comorbidities  PMH diabetes with neuropathy, obesity, hypertension, hyperlipidemia, osteoporosis,Rt foot surgery for midfoot osteotomy, arthrodesis and achilles lengthening in 2016.    Examination-Activity Limitations  Bend;Locomotion Level;Carry;Stairs;Stand;Lift    Examination-Participation Restrictions  Meal Prep;Cleaning;Community Activity;Driving;Laundry    Stability/Clinical Decision Making  Evolving/Moderate complexity    Rehab Potential  Good    PT Frequency  2x / week    PT Duration  8 weeks    PT Treatment/Interventions  ADLs/Self Care Home Management;Aquatic Therapy;Cryotherapy;Moist Heat;Traction;Ultrasound;Gait training;Functional mobility training;Therapeutic activities;Therapeutic exercise;Balance training;Neuromuscular re-education;Orthotic Fit/Training;Manual techniques;Passive range of motion;Dry needling;Taping;Joint Manipulations    PT Next Visit Plan  functional LE strengthening, sit <> stands with correct technique, step through gait with SPC, corner balance exercises, hip flexor stretch?,  work on stairs while carrying something in one hand    PT Home Exercise Plan  Access Code: [email or web address]    Consulted and Agree with Plan of Care  Patient       Patient will benefit from skilled therapeutic intervention in order to improve the following deficits and impairments:  Abnormal gait, Decreased activity tolerance, Decreased balance, Decreased coordination, Decreased range of motion, Decreased strength, Difficulty walking, Impaired flexibility, Postural dysfunction, Pain, Obesity  Visit Diagnosis: Other abnormalities of gait and mobility  Muscle weakness (generalized)  Chronic bilateral low back pain without sciatica     Problem List Patient Active Problem List   Diagnosis Date Noted  . Peripheral neuropathy 02/02/2019  . Gait abnormality 02/02/2019  . Urinary urgency 02/02/2019  . Chronic bilateral low back pain without  sciatica 02/02/2019  . DOE (dyspnea on exertion) 10/05/2017  . Bruit of right carotid artery 10/05/2017  . Coronary artery calcification seen on CT scan 10/05/2017  . Chest pain 10/04/2017  . Aortic atherosclerosis ([place]) 10/04/2017  . Charcot foot due to diabetes mellitus ([place]) 11/23/2014    Arliss Journey, PT, DPT 03/02/2019, 5:15 PM  [facility] [address] [place], [place], [address] Phone: [phone]   Fax:  [phone]  Name: Pamela Snyder MRN: [id number] Date of Birth: [birth year]

## 2019-03-07 ENCOUNTER — Ambulatory Visit: Payer: Medicare Other | Admitting: Physical Therapy

## 2019-03-07 ENCOUNTER — Other Ambulatory Visit: Payer: Self-pay

## 2019-03-07 DIAGNOSIS — M79671 Pain in right foot: Secondary | ICD-10-CM | POA: Diagnosis not present

## 2019-03-07 DIAGNOSIS — M6281 Muscle weakness (generalized): Secondary | ICD-10-CM

## 2019-03-07 DIAGNOSIS — R2689 Other abnormalities of gait and mobility: Secondary | ICD-10-CM

## 2019-03-07 DIAGNOSIS — M545 Low back pain: Secondary | ICD-10-CM | POA: Diagnosis not present

## 2019-03-07 DIAGNOSIS — G8929 Other chronic pain: Secondary | ICD-10-CM

## 2019-03-07 NOTE — Therapy (Signed)
[facility] [address] [place], [place], [address] Phone: [phone]   Fax:  [phone]  Physical Therapy Treatment  Patient Details  Name: Pamela Snyder MRN: [id number] Date of Birth: 1945/07/18 Referring Provider (PT): Marcial Pacas, MD   Encounter Date: 03/07/2019  PT End of Session - 03/07/19 1519    Visit Number  4    Number of Visits  16    Date for PT Re-Evaluation  04/20/19    Authorization Type  MCR    PT Start Time  [id number]    PT Stop Time  1445    PT Time Calculation (min)  40 min    Equipment Utilized During Treatment  Gait belt    Activity Tolerance  Patient tolerated treatment well    Behavior During Therapy  [facility] for tasks assessed/performed       Past Medical History:  Diagnosis Date  . Arthritis    "right foot; spine; hands" (11/23/2014)  . Charcot foot due to diabetes mellitus ([place])   . GERD (gastroesophageal reflux disease)   . Gout   . Hyperlipemia   . Hypertension   . Migraine    hx  . Neuropathy   . Numbness   . Thyroid goiter 1986  . Type II diabetes mellitus ([place])     Past Surgical History:  Procedure Laterality Date  . ABDOMINAL HYSTERECTOMY  1999   w/BSO  . ACHILLES TENDON LENGTHENING Right 11/23/2014  . ACHILLES TENDON LENGTHENING Right 11/23/2014   Procedure: RIGHT ACHILLES PERCUTANEOUS TENDON LENGTHENING;  Surgeon: Wylene Simmer, MD;  Location: [facility];  Service: Orthopedics;  Laterality: Right;  . APPENDECTOMY  1963  . ARTHRODESIS Right 11/23/2014   mid foot  . CARDIAC CATHETERIZATION  08/2004  . FOOT ARTHRODESIS Right 11/23/2014   Procedure: RIGHT MID FOOT ARTHRODESIS;  Surgeon: Wylene Simmer, MD;  Location: [facility];  Service: Orthopedics;  Laterality: Right;  . METATARSAL OSTEOTOMY Right 11/23/2014   Procedure: RIGHT MID FOOT OSTEOTOMY;  Surgeon: Wylene Simmer, MD;  Location: [facility];  Service: Orthopedics;  Laterality: Right;  . OSTEOTOMY Right 11/23/2014   mid foot  . PARTIAL  THYMECTOMY  1986   ? side  . TONSILLECTOMY  1954    There were no vitals filed for this visit.  Subjective Assessment - 03/07/19 1518    Subjective  Lt hip is really bothering her 6 to 7/10 pain.    Pertinent History  PMH diabetes with neuropathy, obesity, hypertension, hyperlipidemia, osteoporosis,Rt foot surgery for midfoot osteotomy, arthrodesis and achilles lengthening in 2016.    Limitations  Lifting;Standing;Walking;House hold activities;Sitting    How long can you stand comfortably?  5    How long can you walk comfortably?  3 min    Diagnostic tests  Has MRI of lumbar and neck scheduled for 03/08/19    Patient Stated Goals  be able to improve balance and walking, maybe be able to walk without cane, be able to carry things up the steps better.      Treatment/Interventions  hip flexor stretch, hip adductor stretch with strap 3X 30 sec each on Lt  heel slides with strap for hip flexor activation with assistance 5 sec 2X10 on Lt [place] X 10 (stopped at 10 as started causing some hip pain) Balance in bars with UE support as needed for gait with head turns up/down and lateral, retrowalking, standing on foam feet apart progressed to feet closer together, progressed to feet together. Rocker board x 20  A-P and X 20 lateral   PT Short Term Goals - 02/28/19 1506      PT SHORT TERM GOAL #1   Title  Pt will be I and compliant with HEP (all STG 4 weeks 03/27/19)    Status  On-going      PT SHORT TERM GOAL #2   Title  Pt will improve BERG balance at least 3 points to show improved balance    Baseline  15 no AD    Status  On-going      PT SHORT TERM GOAL #3   Title  Pt will improve TUG score with LRAD by >2 seconds to show improved balance and gait speed    Baseline  16    Status  New        PT Long Term Goals - 02/28/19 1507      PT LONG TERM GOAL #1   Title  Pt will improve BERG balance without AD to >21 to show improved balance. (Time for all LTG 8 weeks 04/20/19)    Baseline   15    Status  On-going      PT LONG TERM GOAL #2   Title  Pt will improve gait speed and TUG time to show improved gait and balance    Baseline  16 sec TUG and 1.66 ft/sec    Status  On-going      PT LONG TERM GOAL #3   Title  Pt will be able to easier negotiate stairs while carrying something in one hand.    Status  On-going            Plan - 03/07/19 1520    Clinical Impression Statement  Showed her hip flexion stretch and hip adductor stretch as she relays "muscle pain deep in her Lt hip". Told her to back off of any exercises at home that directly cause hip pain. Continued to work on balance and she continues to have deficits in this area.    Personal Factors and Comorbidities  Comorbidity 1;Comorbidity 2;Comorbidity 3+    Comorbidities  PMH diabetes with neuropathy, obesity, hypertension, hyperlipidemia, osteoporosis,Rt foot surgery for midfoot osteotomy, arthrodesis and achilles lengthening in 2016.    Examination-Activity Limitations  Bend;Locomotion Level;Carry;Stairs;Stand;Lift    Examination-Participation Restrictions  Meal Prep;Cleaning;Community Activity;Driving;Laundry    Stability/Clinical Decision Making  Evolving/Moderate complexity    Rehab Potential  Good    PT Frequency  2x / week    PT Duration  8 weeks    PT Treatment/Interventions  ADLs/Self Care Home Management;Aquatic Therapy;Cryotherapy;Moist Heat;Traction;Ultrasound;Gait training;Functional mobility training;Therapeutic activities;Therapeutic exercise;Balance training;Neuromuscular re-education;Orthotic Fit/Training;Manual techniques;Passive range of motion;Dry needling;Taping;Joint Manipulations    PT Next Visit Plan  functional LE strengthening, sit <> stands with correct technique, step through gait with SPC, corner balance exercises, hip flexor stretch?,  work on stairs while carrying something in one hand    PT Home Exercise Plan  Access Code: [email or web address]    Consulted and Agree with Plan of Care  Patient        Patient will benefit from skilled therapeutic intervention in order to improve the following deficits and impairments:  Abnormal gait, Decreased activity tolerance, Decreased balance, Decreased coordination, Decreased range of motion, Decreased strength, Difficulty walking, Impaired flexibility, Postural dysfunction, Pain, Obesity  Visit Diagnosis: Other abnormalities of gait and mobility  Muscle weakness (generalized)  Chronic bilateral low back pain without sciatica  Pain in right foot     Problem List Patient Active Problem List   Diagnosis  Date Noted  . Peripheral neuropathy 02/02/2019  . Gait abnormality 02/02/2019  . Urinary urgency 02/02/2019  . Chronic bilateral low back pain without sciatica 02/02/2019  . DOE (dyspnea on exertion) 10/05/2017  . Bruit of right carotid artery 10/05/2017  . Coronary artery calcification seen on CT scan 10/05/2017  . Chest pain 10/04/2017  . Aortic atherosclerosis ([place]) 10/04/2017  . Charcot foot due to diabetes mellitus ([facility]) 11/23/2014    Silvestre Mesi 03/07/2019, 3:28 PM  [facility] [address] [place], [place], [address] Phone: [phone]   Fax:  [phone]  Name: VANNY KINDSCHI MRN: [id number] Date of Birth: 1945-08-29

## 2019-03-08 ENCOUNTER — Ambulatory Visit
Admission: RE | Admit: 2019-03-08 | Discharge: 2019-03-08 | Disposition: A | Discharge status: Home / Self Care | Payer: Medicare Other | Source: Ambulatory Visit | Attending: Neurology | Admitting: Neurology

## 2019-03-08 DIAGNOSIS — G3281 Cerebellar ataxia in diseases classified elsewhere: Secondary | ICD-10-CM | POA: Diagnosis not present

## 2019-03-09 ENCOUNTER — Ambulatory Visit: Payer: Medicare Other | Attending: Neurology | Admitting: Physical Therapy

## 2019-03-09 ENCOUNTER — Other Ambulatory Visit: Payer: Self-pay

## 2019-03-09 DIAGNOSIS — R2689 Other abnormalities of gait and mobility: Secondary | ICD-10-CM | POA: Insufficient documentation

## 2019-03-09 DIAGNOSIS — M545 Low back pain: Secondary | ICD-10-CM | POA: Insufficient documentation

## 2019-03-09 DIAGNOSIS — G8929 Other chronic pain: Secondary | ICD-10-CM | POA: Insufficient documentation

## 2019-03-09 DIAGNOSIS — M79671 Pain in right foot: Secondary | ICD-10-CM | POA: Diagnosis not present

## 2019-03-09 DIAGNOSIS — M6281 Muscle weakness (generalized): Secondary | ICD-10-CM | POA: Insufficient documentation

## 2019-03-09 NOTE — Therapy (Signed)
[facility] [address] [place], [place], [address] Phone: [phone]   Fax:  [phone]  Physical Therapy Treatment  Patient Details  Name: Pamela Snyder MRN: [id number] Date of Birth: 20-Apr-1946 Referring Provider (PT): Marcial Pacas, MD   Encounter Date: 03/09/2019  PT End of Session - 03/09/19 1802    Visit Number  5    Number of Visits  16    Date for PT Re-Evaluation  04/20/19    Authorization Type  MCR    PT Start Time  [id number]    PT Stop Time  1445    PT Time Calculation (min)  40 min    Equipment Utilized During Treatment  Gait belt    Activity Tolerance  Patient tolerated treatment well    Behavior During Therapy  [facility] for tasks assessed/performed       Past Medical History:  Diagnosis Date  . Arthritis    "right foot; spine; hands" (11/23/2014)  . Charcot foot due to diabetes mellitus ([place])   . GERD (gastroesophageal reflux disease)   . Gout   . Hyperlipemia   . Hypertension   . Migraine    hx  . Neuropathy   . Numbness   . Thyroid goiter 1986  . Type II diabetes mellitus ([place])     Past Surgical History:  Procedure Laterality Date  . ABDOMINAL HYSTERECTOMY  1999   w/BSO  . ACHILLES TENDON LENGTHENING Right 11/23/2014  . ACHILLES TENDON LENGTHENING Right 11/23/2014   Procedure: RIGHT ACHILLES PERCUTANEOUS TENDON LENGTHENING;  Surgeon: Wylene Simmer, MD;  Location: [facility];  Service: Orthopedics;  Laterality: Right;  . APPENDECTOMY  1963  . ARTHRODESIS Right 11/23/2014   mid foot  . CARDIAC CATHETERIZATION  08/2004  . FOOT ARTHRODESIS Right 11/23/2014   Procedure: RIGHT MID FOOT ARTHRODESIS;  Surgeon: Wylene Simmer, MD;  Location: [facility];  Service: Orthopedics;  Laterality: Right;  . METATARSAL OSTEOTOMY Right 11/23/2014   Procedure: RIGHT MID FOOT OSTEOTOMY;  Surgeon: Wylene Simmer, MD;  Location: [facility];  Service: Orthopedics;  Laterality: Right;  . OSTEOTOMY Right 11/23/2014   mid foot  . PARTIAL  THYMECTOMY  1986   ? side  . TONSILLECTOMY  1954    There were no vitals filed for this visit.  Subjective Assessment - 03/09/19 1759    Subjective  My hip is a lot better with the exercises you gave me. I want to know some exercises I can do for my arm strength, I have 3 lb weights at home. I have some pain in my feet but she does not rate intenstity    Pertinent History  PMH diabetes with neuropathy, obesity, hypertension, hyperlipidemia, osteoporosis,Rt foot surgery for midfoot osteotomy, arthrodesis and achilles lengthening in 2016.        Interventions/treatment  Nu step X 6 min UE/LE,  in bars for dynamic balance: march walk, retro walk, walking with head turns up/down and lateral,  Gastroc stretch sitting with strap 30 sec X 3  Sitting UE strength (shoulder flex, abd, press, tricep, bicep, she was given print out for these to do at home, will not continue these in PT)  Step ups fwd and lateral on 6 inch step with bilat UE support X 15 bilat  Sit to stands 2X10    PT Short Term Goals - 02/28/19 1506      PT SHORT TERM GOAL #1   Title  Pt will be I and compliant with HEP (all STG  4 weeks 03/27/19)    Status  On-going      PT SHORT TERM GOAL #2   Title  Pt will improve BERG balance at least 3 points to show improved balance    Baseline  15 no AD    Status  On-going      PT SHORT TERM GOAL #3   Title  Pt will improve TUG score with LRAD by >2 seconds to show improved balance and gait speed    Baseline  16    Status  New        PT Long Term Goals - 02/28/19 1507      PT LONG TERM GOAL #1   Title  Pt will improve BERG balance without AD to >21 to show improved balance. (Time for all LTG 8 weeks 04/20/19)    Baseline  15    Status  On-going      PT LONG TERM GOAL #2   Title  Pt will improve gait speed and TUG time to show improved gait and balance    Baseline  16 sec TUG and 1.66 ft/sec    Status  On-going      PT LONG TERM GOAL #3   Title  Pt will be able  to easier negotiate stairs while carrying something in one hand.    Status  On-going            Plan - 03/09/19 1803    Clinical Impression Statement  She was not having hip pain so progressed her strength and dynamic balance today with good tolerance. She does fatigue easily and continues to have poor balance without UE support. She had MRI yesterday for her back and neck but it does not appear the results are in EPIC yet.    Personal Factors and Comorbidities  Comorbidity 1;Comorbidity 2;Comorbidity 3+    Comorbidities  PMH diabetes with neuropathy, obesity, hypertension, hyperlipidemia, osteoporosis,Rt foot surgery for midfoot osteotomy, arthrodesis and achilles lengthening in 2016.    Examination-Activity Limitations  Bend;Locomotion Level;Carry;Stairs;Stand;Lift    Examination-Participation Restrictions  Meal Prep;Cleaning;Community Activity;Driving;Laundry    Stability/Clinical Decision Making  Evolving/Moderate complexity    Rehab Potential  Good    PT Frequency  2x / week    PT Duration  8 weeks    PT Treatment/Interventions  ADLs/Self Care Home Management;Aquatic Therapy;Cryotherapy;Moist Heat;Traction;Ultrasound;Gait training;Functional mobility training;Therapeutic activities;Therapeutic exercise;Balance training;Neuromuscular re-education;Orthotic Fit/Training;Manual techniques;Passive range of motion;Dry needling;Taping;Joint Manipulations    PT Next Visit Plan  what did MRI show? functional LE strengthening, sit <> stands with correct technique, step through gait with SPC, corner balance exercises, hip flexor stretch?,  work on stairs while carrying something in one hand    PT Home Exercise Plan  Access Code: [email or web address]    Consulted and Agree with Plan of Care  Patient       Patient will benefit from skilled therapeutic intervention in order to improve the following deficits and impairments:  Abnormal gait, Decreased activity tolerance, Decreased balance, Decreased  coordination, Decreased range of motion, Decreased strength, Difficulty walking, Impaired flexibility, Postural dysfunction, Pain, Obesity  Visit Diagnosis: Other abnormalities of gait and mobility  Muscle weakness (generalized)  Chronic bilateral low back pain without sciatica  Pain in right foot     Problem List Patient Active Problem List   Diagnosis Date Noted  . Peripheral neuropathy 02/02/2019  . Gait abnormality 02/02/2019  . Urinary urgency 02/02/2019  . Chronic bilateral low back pain without sciatica 02/02/2019  . DOE (dyspnea on  exertion) 10/05/2017  . Bruit of right carotid artery 10/05/2017  . Coronary artery calcification seen on CT scan 10/05/2017  . Chest pain 10/04/2017  . Aortic atherosclerosis ([place]) 10/04/2017  . Charcot foot due to diabetes mellitus ([place]) 11/23/2014    Silvestre Mesi 03/09/2019, 6:05 PM  [facility] [address] [place], [place], [address] Phone: [phone]   Fax:  [phone]  Name: Pamela Snyder MRN: [id number] Date of Birth: 10/01/1945

## 2019-03-10 ENCOUNTER — Telehealth: Payer: Self-pay | Admitting: Neurology

## 2019-03-10 DIAGNOSIS — M48061 Spinal stenosis, lumbar region without neurogenic claudication: Secondary | ICD-10-CM | POA: Insufficient documentation

## 2019-03-10 DIAGNOSIS — R269 Unspecified abnormalities of gait and mobility: Secondary | ICD-10-CM

## 2019-03-10 DIAGNOSIS — R3915 Urgency of urination: Secondary | ICD-10-CM

## 2019-03-10 DIAGNOSIS — G959 Disease of spinal cord, unspecified: Secondary | ICD-10-CM | POA: Insufficient documentation

## 2019-03-10 NOTE — Telephone Encounter (Signed)
Please call patient, MRI of lumbar showed multilevel degenerative disc disease, severe spinal stenosis at L2-3, 3 4, 4 5 level,  MRI of cervical spine also showed multiple degenerative disc disease, moderate spinal stenosis at C5-6  I will refer her to neurosurgeon for evaluation  IMPRESSION:   MRI lumbar spine (without) demonstrating - At L2-3: disc bulging and facet hypertrophy with severe spinal stenosis and mild biforaminal stenosis  - At L3-4: disc bulging and facet hypertrophy with severe spinal stenosis and moderate right and severe left foraminal stenosis  - At L4-5: disc bulging and facet hypertrophy with severe spinal stenosis and moderate-severe right and moderate left foraminal stenosis  - At L5-S1: disc bulging and facet hypertrophy with mild spinal stenosis and moderate-severe biforaminal stenosis  - Chronic compression fracture of T12 with 30% loss of vertebral body height  IMPRESSION:   MRI cervical spine (without) demonstrating: - At C5-6: disc bulging and uncovertebral joint hypertrophy with moderate spinal stenosis and severe biforaminal stenosis; no cord signal changes. - At C3-4, C4-5, C6-7: disc bulging and uncovertebral joint hypertrophy with mild spinal stenosis and severe biforaminal stenosis.

## 2019-03-10 NOTE — Telephone Encounter (Signed)
I spoke to the patient and notified her of the MRI results below.  She verbalized understanding and was in agreement to the neurosurgery referral.  She is aware to expect a call for scheduling.

## 2019-03-15 DIAGNOSIS — Z23 Encounter for immunization: Secondary | ICD-10-CM | POA: Diagnosis not present

## 2019-03-16 ENCOUNTER — Other Ambulatory Visit: Payer: Self-pay

## 2019-03-16 ENCOUNTER — Ambulatory Visit: Payer: Medicare Other | Admitting: Physical Therapy

## 2019-03-16 DIAGNOSIS — M545 Low back pain, unspecified: Secondary | ICD-10-CM

## 2019-03-16 DIAGNOSIS — M6281 Muscle weakness (generalized): Secondary | ICD-10-CM | POA: Diagnosis not present

## 2019-03-16 DIAGNOSIS — G8929 Other chronic pain: Secondary | ICD-10-CM

## 2019-03-16 DIAGNOSIS — M79671 Pain in right foot: Secondary | ICD-10-CM | POA: Diagnosis not present

## 2019-03-16 DIAGNOSIS — R2689 Other abnormalities of gait and mobility: Secondary | ICD-10-CM

## 2019-03-16 NOTE — Therapy (Signed)
[facility] [address] [place], [place], [address] Phone: [phone]   Fax:  [phone]  Physical Therapy Treatment  Patient Details  Name: Pamela Snyder MRN: [id number] Date of Birth: 1946/04/17 Referring Provider (PT): Marcial Pacas, MD   Encounter Date: 03/16/2019  PT End of Session - 03/16/19 1726    Visit Number  6    Number of Visits  16    Date for PT Re-Evaluation  04/20/19    Authorization Type  MCR    PT Start Time  1400    PT Stop Time  1445    PT Time Calculation (min)  45 min    Activity Tolerance  Patient tolerated treatment well    Behavior During Therapy  [facility] for tasks assessed/performed       Past Medical History:  Diagnosis Date  . Arthritis    "right foot; spine; hands" (11/23/2014)  . Charcot foot due to diabetes mellitus ([place])   . GERD (gastroesophageal reflux disease)   . Gout   . Hyperlipemia   . Hypertension   . Migraine    hx  . Neuropathy   . Numbness   . Thyroid goiter 1986  . Type II diabetes mellitus ([place])     Past Surgical History:  Procedure Laterality Date  . ABDOMINAL HYSTERECTOMY  1999   w/BSO  . ACHILLES TENDON LENGTHENING Right 11/23/2014  . ACHILLES TENDON LENGTHENING Right 11/23/2014   Procedure: RIGHT ACHILLES PERCUTANEOUS TENDON LENGTHENING;  Surgeon: Wylene Simmer, MD;  Location: [facility];  Service: Orthopedics;  Laterality: Right;  . APPENDECTOMY  1963  . ARTHRODESIS Right 11/23/2014   mid foot  . CARDIAC CATHETERIZATION  08/2004  . FOOT ARTHRODESIS Right 11/23/2014   Procedure: RIGHT MID FOOT ARTHRODESIS;  Surgeon: Wylene Simmer, MD;  Location: [facility];  Service: Orthopedics;  Laterality: Right;  . METATARSAL OSTEOTOMY Right 11/23/2014   Procedure: RIGHT MID FOOT OSTEOTOMY;  Surgeon: Wylene Simmer, MD;  Location: [facility];  Service: Orthopedics;  Laterality: Right;  . OSTEOTOMY Right 11/23/2014   mid foot  . PARTIAL THYMECTOMY  1986   ? side  . TONSILLECTOMY  1954     There were no vitals filed for this visit.  Subjective Assessment - 03/16/19 1724    Subjective  I have new MRI results for my neck and back and my MD wants to refer me to neurosurgeon.    Pertinent History  PMH diabetes with neuropathy, obesity, hypertension, hyperlipidemia, osteoporosis,Rt foot surgery for midfoot osteotomy, arthrodesis and achilles lengthening in 2016.    Limitations  Lifting;Standing;Walking;House hold activities;Sitting    How long can you stand comfortably?  5    How long can you walk comfortably?  3 min    Diagnostic tests  Has MRI of lumbar and neck scheduled for 03/08/19    Patient Stated Goals  be able to improve balance and walking, maybe be able to walk without cane, be able to carry things up the steps better.    Currently in Pain?  Yes    Pain Score  5     Pain Location  Back   and neck   Pain Descriptors / Indicators  Aching    Pain Type  Chronic pain        Treatment/Interventions  Nu step for 10 min while PT provided education on MRI results as she did not understand what her MD had told her.   Supine Chin Tuck - 10 reps  Seated Levator Scapulae Stretch - 2 sets - 30 hold  Seated Cervical Sidebending Stretch - 3 sets - 30 hold   Supine Single Knee to Chest - 3 sets - 30 hold   Seated Lumbar Flexion Stretch - 10 reps - 1 sets - 10 hold   Supine Double Knee to Chest - 3 reps - 1 sets - 30 hold   Gait with supervision with SPC 150 ft      PT Education - 03/16/19 1726    Education Details  stretches to help with lumbar-cervical stenosis found on recent imaging    Person(s) Educated  Patient    Methods  Explanation;Demonstration;Verbal cues;Handout    Comprehension  Verbalized understanding;Returned demonstration       PT Short Term Goals - 02/28/19 1506      PT SHORT TERM GOAL #1   Title  Pt will be I and compliant with HEP (all STG 4 weeks 03/27/19)    Status  On-going      PT SHORT TERM GOAL #2   Title  Pt will improve BERG  balance at least 3 points to show improved balance    Baseline  15 no AD    Status  On-going      PT SHORT TERM GOAL #3   Title  Pt will improve TUG score with LRAD by >2 seconds to show improved balance and gait speed    Baseline  16    Status  New        PT Long Term Goals - 02/28/19 1507      PT LONG TERM GOAL #1   Title  Pt will improve BERG balance without AD to >21 to show improved balance. (Time for all LTG 8 weeks 04/20/19)    Baseline  15    Status  On-going      PT LONG TERM GOAL #2   Title  Pt will improve gait speed and TUG time to show improved gait and balance    Baseline  16 sec TUG and 1.66 ft/sec    Status  On-going      PT LONG TERM GOAL #3   Title  Pt will be able to easier negotiate stairs while carrying something in one hand.    Status  On-going            Plan - 03/16/19 1727    Clinical Impression Statement  new MRI showes severe spinal and foraminal stenosis in lumbar and cervical at most levels. Session focused on HEP revision for neck and lumbar flexion based stretches for stenosis. She showed good understanding of these. She still needs balance and gait training as well.    Personal Factors and Comorbidities  Comorbidity 1;Comorbidity 2;Comorbidity 3+    Comorbidities  PMH diabetes with neuropathy, obesity, hypertension, hyperlipidemia, osteoporosis,Rt foot surgery for midfoot osteotomy, arthrodesis and achilles lengthening in 2016.    Examination-Activity Limitations  Bend;Locomotion Level;Carry;Stairs;Stand;Lift    Examination-Participation Restrictions  Meal Prep;Cleaning;Community Activity;Driving;Laundry    Stability/Clinical Decision Making  Evolving/Moderate complexity    Rehab Potential  Good    PT Frequency  2x / week    PT Duration  8 weeks    PT Treatment/Interventions  ADLs/Self Care Home Management;Aquatic Therapy;Cryotherapy;Moist Heat;Traction;Ultrasound;Gait training;Functional mobility training;Therapeutic activities;Therapeutic  exercise;Balance training;Neuromuscular re-education;Orthotic Fit/Training;Manual techniques;Passive range of motion;Dry needling;Taping;Joint Manipulations    PT Next Visit Plan  functional LE strengthening, step through gait with SPC, corner balance exercises,   work on stairs while carrying something in one  hand    PT Home Exercise Plan  Access Code: [email or web address]    Consulted and Agree with Plan of Care  Patient       Patient will benefit from skilled therapeutic intervention in order to improve the following deficits and impairments:  Abnormal gait, Decreased activity tolerance, Decreased balance, Decreased coordination, Decreased range of motion, Decreased strength, Difficulty walking, Impaired flexibility, Postural dysfunction, Pain, Obesity  Visit Diagnosis: Other abnormalities of gait and mobility  Muscle weakness (generalized)  Chronic bilateral low back pain without sciatica  Pain in right foot     Problem List Patient Active Problem List   Diagnosis Date Noted  . Spinal stenosis of lumbar region 03/10/2019  . Cervical myelopathy ([place]) 03/10/2019  . Peripheral neuropathy 02/02/2019  . Gait abnormality 02/02/2019  . Urinary urgency 02/02/2019  . Chronic bilateral low back pain without sciatica 02/02/2019  . DOE (dyspnea on exertion) 10/05/2017  . Bruit of right carotid artery 10/05/2017  . Coronary artery calcification seen on CT scan 10/05/2017  . Chest pain 10/04/2017  . Aortic atherosclerosis ([place]) 10/04/2017  . Charcot foot due to diabetes mellitus ([place]) 11/23/2014    Silvestre Mesi 03/16/2019, 5:30 PM  [facility] [address] [place], [place], [address] Phone: [phone]   Fax:  [phone]  Name: Pamela Snyder MRN: [id number] Date of Birth: [birth year]

## 2019-03-16 NOTE — Patient Instructions (Signed)
Access Code: [email or web address]  URL: [email or web address]  Date: 03/16/2019  Prepared by: Elsie Ra   Exercises  Supine Chin Tuck - 10 reps - 3 sets - 2x daily - 6x weekly  Seated Levator Scapulae Stretch - 2 sets - 30 hold - 2x daily - 6x weekly  Seated Cervical Sidebending Stretch - 3 sets - 30 hold - 2x daily - 6x weekly  Supine Single Knee to Chest - 3 sets - 30 hold - 2x daily - 6x weekly  Seated Lumbar Flexion Stretch - 10 reps - 1 sets - 10 hold - 2x daily - 6x weekly  Supine Double Knee to Chest - 3 reps - 1 sets - 30 hold - 2x daily - 6x weekly

## 2019-03-18 ENCOUNTER — Other Ambulatory Visit: Payer: Self-pay | Admitting: Interventional Cardiology

## 2019-03-21 ENCOUNTER — Ambulatory Visit: Payer: Medicare Other | Admitting: Physical Therapy

## 2019-03-21 ENCOUNTER — Other Ambulatory Visit: Payer: Self-pay

## 2019-03-21 DIAGNOSIS — M79671 Pain in right foot: Secondary | ICD-10-CM | POA: Diagnosis not present

## 2019-03-21 DIAGNOSIS — R2689 Other abnormalities of gait and mobility: Secondary | ICD-10-CM | POA: Diagnosis not present

## 2019-03-21 DIAGNOSIS — M545 Low back pain: Secondary | ICD-10-CM | POA: Diagnosis not present

## 2019-03-21 DIAGNOSIS — M6281 Muscle weakness (generalized): Secondary | ICD-10-CM

## 2019-03-21 DIAGNOSIS — G8929 Other chronic pain: Secondary | ICD-10-CM | POA: Diagnosis not present

## 2019-03-21 NOTE — Therapy (Signed)
[facility] [address] [place], [place], [address] Phone: [phone]   Fax:  [phone]  Physical Therapy Treatment  Patient Details  Name: Pamela Snyder MRN: [id number] Date of Birth: 1945/12/16 Referring Provider (PT): Marcial Pacas, MD   Encounter Date: 03/21/2019  PT End of Session - 03/21/19 1420    Visit Number  7    Number of Visits  16    Date for PT Re-Evaluation  04/20/19    Authorization Type  MCR    PT Start Time  [id number]    PT Stop Time  1445    PT Time Calculation (min)  40 min    Activity Tolerance  Patient tolerated treatment well    Behavior During Therapy  [facility] for tasks assessed/performed       Past Medical History:  Diagnosis Date  . Arthritis    "right foot; spine; hands" (11/23/2014)  . Charcot foot due to diabetes mellitus ([place])   . GERD (gastroesophageal reflux disease)   . Gout   . Hyperlipemia   . Hypertension   . Migraine    hx  . Neuropathy   . Numbness   . Thyroid goiter 1986  . Type II diabetes mellitus ([place])     Past Surgical History:  Procedure Laterality Date  . ABDOMINAL HYSTERECTOMY  1999   w/BSO  . ACHILLES TENDON LENGTHENING Right 11/23/2014  . ACHILLES TENDON LENGTHENING Right 11/23/2014   Procedure: RIGHT ACHILLES PERCUTANEOUS TENDON LENGTHENING;  Surgeon: Wylene Simmer, MD;  Location: [facility];  Service: Orthopedics;  Laterality: Right;  . APPENDECTOMY  1963  . ARTHRODESIS Right 11/23/2014   mid foot  . CARDIAC CATHETERIZATION  08/2004  . FOOT ARTHRODESIS Right 11/23/2014   Procedure: RIGHT MID FOOT ARTHRODESIS;  Surgeon: Wylene Simmer, MD;  Location: [facility];  Service: Orthopedics;  Laterality: Right;  . METATARSAL OSTEOTOMY Right 11/23/2014   Procedure: RIGHT MID FOOT OSTEOTOMY;  Surgeon: Wylene Simmer, MD;  Location: [facility];  Service: Orthopedics;  Laterality: Right;  . OSTEOTOMY Right 11/23/2014   mid foot  . PARTIAL THYMECTOMY  1986   ? side  . TONSILLECTOMY  1954     There were no vitals filed for this visit.  Subjective Assessment - 03/21/19 1419    Subjective  I am seeing the neurosurgeon tommorow. I am having pain in my Rt hip 6/10    Pertinent History  PMH diabetes with neuropathy, obesity, hypertension, hyperlipidemia, osteoporosis,Rt foot surgery for midfoot osteotomy, arthrodesis and achilles lengthening in 2016.    Limitations  Lifting;Standing;Walking;House hold activities;Sitting    How long can you stand comfortably?  5    How long can you walk comfortably?  3 min    Diagnostic tests  Has MRI of lumbar and neck scheduled for 03/08/19    Patient Stated Goals  be able to improve balance and walking, maybe be able to walk without cane, be able to carry things up the steps better.      Treatment/Interventions   Nu step X 6 min UE/LE  Stretches: sitting lumbar flexion with Pball roll outs 10 sec X 10, supine DKTC with feet on Pball 5 sec X 15, piriformis stretch 30 sec X 2 bilat  Balance training in bars march walk, retro walk, walking with head turns up/down and lateral, Tandem balance, sidstepping  Step ups fwd and lateral on 6 inch step with bilat UE support X 15 bilat  Manual therapy for Long axis distraction, manual hip  stretching and PROM    PT Short Term Goals - 02/28/19 1506      PT SHORT TERM GOAL #1   Title  Pt will be I and compliant with HEP (all STG 4 weeks 03/27/19)    Status  On-going      PT SHORT TERM GOAL #2   Title  Pt will improve BERG balance at least 3 points to show improved balance    Baseline  15 no AD    Status  On-going      PT SHORT TERM GOAL #3   Title  Pt will improve TUG score with LRAD by >2 seconds to show improved balance and gait speed    Baseline  16    Status  New        PT Long Term Goals - 02/28/19 1507      PT LONG TERM GOAL #1   Title  Pt will improve BERG balance without AD to >21 to show improved balance. (Time for all LTG 8 weeks 04/20/19)    Baseline  15    Status  On-going       PT LONG TERM GOAL #2   Title  Pt will improve gait speed and TUG time to show improved gait and balance    Baseline  16 sec TUG and 1.66 ft/sec    Status  On-going      PT LONG TERM GOAL #3   Title  Pt will be able to easier negotiate stairs while carrying something in one hand.    Status  On-going            Plan - 03/21/19 2104    Clinical Impression Statement  She relays compliance with new stretching program for spinal stenosis. She will meet with neurosurgeon tommorow. She will continue to benefit from PT for balance, LE strength, gait and coordination which was worked on again today in session. PT will assess STG next week.    Personal Factors and Comorbidities  Comorbidity 1;Comorbidity 2;Comorbidity 3+    Comorbidities  PMH diabetes with neuropathy, obesity, hypertension, hyperlipidemia, osteoporosis,Rt foot surgery for midfoot osteotomy, arthrodesis and achilles lengthening in 2016.    Examination-Activity Limitations  Bend;Locomotion Level;Carry;Stairs;Stand;Lift    Examination-Participation Restrictions  Meal Prep;Cleaning;Community Activity;Driving;Laundry    Stability/Clinical Decision Making  Evolving/Moderate complexity    Rehab Potential  Good    PT Frequency  2x / week    PT Duration  8 weeks    PT Treatment/Interventions  ADLs/Self Care Home Management;Aquatic Therapy;Cryotherapy;Moist Heat;Traction;Ultrasound;Gait training;Functional mobility training;Therapeutic activities;Therapeutic exercise;Balance training;Neuromuscular re-education;Orthotic Fit/Training;Manual techniques;Passive range of motion;Dry needling;Taping;Joint Manipulations    PT Next Visit Plan  assess STG, functional LE strengthening, step through gait with SPC, corner balance exercises,   work on stairs while carrying something in one hand    PT Home Exercise Plan  Access Code: [email or web address]    Consulted and Agree with Plan of Care  Patient       Patient will benefit from skilled therapeutic  intervention in order to improve the following deficits and impairments:  Abnormal gait, Decreased activity tolerance, Decreased balance, Decreased coordination, Decreased range of motion, Decreased strength, Difficulty walking, Impaired flexibility, Postural dysfunction, Pain, Obesity  Visit Diagnosis: Other abnormalities of gait and mobility  Muscle weakness (generalized)  Chronic bilateral low back pain without sciatica  Pain in right foot     Problem List Patient Active Problem List   Diagnosis Date Noted  . Spinal stenosis of lumbar region 03/10/2019  .  Cervical myelopathy ([place]) 03/10/2019  . Peripheral neuropathy 02/02/2019  . Gait abnormality 02/02/2019  . Urinary urgency 02/02/2019  . Chronic bilateral low back pain without sciatica 02/02/2019  . DOE (dyspnea on exertion) 10/05/2017  . Bruit of right carotid artery 10/05/2017  . Coronary artery calcification seen on CT scan 10/05/2017  . Chest pain 10/04/2017  . Aortic atherosclerosis ([place]) 10/04/2017  . Charcot foot due to diabetes mellitus ([place]) 11/23/2014    Silvestre Mesi 03/21/2019, 9:08 PM  [facility] [address] [place], [place], [address] Phone: [phone]   Fax:  [phone]  Name: ALICAI RENTIE MRN: [id number] Date of Birth: 06-05-1946

## 2019-03-22 DIAGNOSIS — M4712 Other spondylosis with myelopathy, cervical region: Secondary | ICD-10-CM | POA: Diagnosis not present

## 2019-03-22 DIAGNOSIS — M546 Pain in thoracic spine: Secondary | ICD-10-CM | POA: Diagnosis not present

## 2019-03-22 DIAGNOSIS — M5136 Other intervertebral disc degeneration, lumbar region: Secondary | ICD-10-CM | POA: Diagnosis not present

## 2019-03-22 DIAGNOSIS — M545 Low back pain: Secondary | ICD-10-CM | POA: Diagnosis not present

## 2019-03-22 DIAGNOSIS — M4802 Spinal stenosis, cervical region: Secondary | ICD-10-CM | POA: Diagnosis not present

## 2019-03-22 DIAGNOSIS — I10 Essential (primary) hypertension: Secondary | ICD-10-CM | POA: Diagnosis not present

## 2019-03-22 DIAGNOSIS — Z6838 Body mass index (BMI) 38.0-38.9, adult: Secondary | ICD-10-CM | POA: Diagnosis not present

## 2019-03-22 DIAGNOSIS — M4316 Spondylolisthesis, lumbar region: Secondary | ICD-10-CM | POA: Diagnosis not present

## 2019-03-22 DIAGNOSIS — M48062 Spinal stenosis, lumbar region with neurogenic claudication: Secondary | ICD-10-CM | POA: Diagnosis not present

## 2019-03-22 DIAGNOSIS — S22080A Wedge compression fracture of T11-T12 vertebra, initial encounter for closed fracture: Secondary | ICD-10-CM | POA: Diagnosis not present

## 2019-03-23 ENCOUNTER — Ambulatory Visit: Payer: Medicare Other | Admitting: Physical Therapy

## 2019-03-23 DIAGNOSIS — E1143 Type 2 diabetes mellitus with diabetic autonomic (poly)neuropathy: Secondary | ICD-10-CM | POA: Diagnosis not present

## 2019-03-23 DIAGNOSIS — D729 Disorder of white blood cells, unspecified: Secondary | ICD-10-CM | POA: Diagnosis not present

## 2019-03-23 DIAGNOSIS — E876 Hypokalemia: Secondary | ICD-10-CM | POA: Diagnosis not present

## 2019-03-23 DIAGNOSIS — M62838 Other muscle spasm: Secondary | ICD-10-CM | POA: Diagnosis not present

## 2019-03-28 ENCOUNTER — Ambulatory Visit: Payer: Medicare Other | Admitting: Physical Therapy

## 2019-03-28 ENCOUNTER — Other Ambulatory Visit: Payer: Self-pay

## 2019-03-28 DIAGNOSIS — M79671 Pain in right foot: Secondary | ICD-10-CM | POA: Diagnosis not present

## 2019-03-28 DIAGNOSIS — R2689 Other abnormalities of gait and mobility: Secondary | ICD-10-CM

## 2019-03-28 DIAGNOSIS — M6281 Muscle weakness (generalized): Secondary | ICD-10-CM | POA: Diagnosis not present

## 2019-03-28 DIAGNOSIS — G8929 Other chronic pain: Secondary | ICD-10-CM | POA: Diagnosis not present

## 2019-03-28 DIAGNOSIS — M545 Low back pain, unspecified: Secondary | ICD-10-CM

## 2019-03-28 NOTE — Therapy (Signed)
[facility] [address] [place], [place], [address] Phone: [phone]   Fax:  [phone]  Physical Therapy Treatment  Patient Details  Name: Pamela Snyder MRN: [id number] Date of Birth: August 31, 1945 Referring Provider (PT): Marcial Pacas, MD   Encounter Date: 03/28/2019  PT End of Session - 03/28/19 2022    Visit Number  8    Number of Visits  16    Date for PT Re-Evaluation  04/20/19    Authorization Type  MCR    PT Start Time  4920    PT Stop Time  1445    PT Time Calculation (min)  40 min    Activity Tolerance  Patient tolerated treatment well    Behavior During Therapy  [facility] for tasks assessed/performed       Past Medical History:  Diagnosis Date  . Arthritis    "right foot; spine; hands" (11/23/2014)  . Charcot foot due to diabetes mellitus ([place])   . GERD (gastroesophageal reflux disease)   . Gout   . Hyperlipemia   . Hypertension   . Migraine    hx  . Neuropathy   . Numbness   . Thyroid goiter 1986  . Type II diabetes mellitus ([place])     Past Surgical History:  Procedure Laterality Date  . ABDOMINAL HYSTERECTOMY  1999   w/BSO  . ACHILLES TENDON LENGTHENING Right 11/23/2014  . ACHILLES TENDON LENGTHENING Right 11/23/2014   Procedure: RIGHT ACHILLES PERCUTANEOUS TENDON LENGTHENING;  Surgeon: Wylene Simmer, MD;  Location: [facility];  Service: Orthopedics;  Laterality: Right;  . APPENDECTOMY  1963  . ARTHRODESIS Right 11/23/2014   mid foot  . CARDIAC CATHETERIZATION  08/2004  . FOOT ARTHRODESIS Right 11/23/2014   Procedure: RIGHT MID FOOT ARTHRODESIS;  Surgeon: Wylene Simmer, MD;  Location: [facility];  Service: Orthopedics;  Laterality: Right;  . METATARSAL OSTEOTOMY Right 11/23/2014   Procedure: RIGHT MID FOOT OSTEOTOMY;  Surgeon: Wylene Simmer, MD;  Location: [facility];  Service: Orthopedics;  Laterality: Right;  . OSTEOTOMY Right 11/23/2014   mid foot  . PARTIAL THYMECTOMY  1986   ? side  . TONSILLECTOMY  1954     There were no vitals filed for this visit.  Subjective Assessment - 03/28/19 2019    Subjective  she relays neurosurgeon she met with wants to do surgery, she wants to seek out a 2nd opinon first, she will also have a MRI on her thoracic and NCV testing as well due to continued N/T and pain in her arms and hands. She does however relay her back is feeling better from rest and the stretching program her PT gave her.    Pertinent History  PMH diabetes with neuropathy, obesity, hypertension, hyperlipidemia, osteoporosis,Rt foot surgery for midfoot osteotomy, arthrodesis and achilles lengthening in 2016.    Limitations  Lifting;Standing;Walking;House hold activities;Sitting    How long can you stand comfortably?  5    How long can you walk comfortably?  3 min    Diagnostic tests  Has MRI of lumbar and neck scheduled for 03/08/19    Patient Stated Goals  be able to improve balance and walking, maybe be able to walk without cane, be able to carry things up the steps better.        Treatment/Interventions  nu step 10 min UE/LE with PT present offering advice on POC and second opinion options for neurosurgeons.  Stretches: supine SKTC and  DKTC 2X30 sec eac, ball rolls DKTC into  flexion 10 sec X10, bridges feet on ball 2X10 (some pain at the end) Balance: in hall walking with head turns up/down and left right, retro walking, cone weave, cone taps   PT Short Term Goals - 02/28/19 1506      PT SHORT TERM GOAL #1   Title  Pt will be I and compliant with HEP (all STG 4 weeks 03/27/19)    Status  On-going      PT SHORT TERM GOAL #2   Title  Pt will improve BERG balance at least 3 points to show improved balance    Baseline  15 no AD    Status  On-going      PT SHORT TERM GOAL #3   Title  Pt will improve TUG score with LRAD by >2 seconds to show improved balance and gait speed    Baseline  16    Status  New        PT Long Term Goals - 02/28/19 1507      PT LONG TERM GOAL #1   Title   Pt will improve BERG balance without AD to >21 to show improved balance. (Time for all LTG 8 weeks 04/20/19)    Baseline  15    Status  On-going      PT LONG TERM GOAL #2   Title  Pt will improve gait speed and TUG time to show improved gait and balance    Baseline  16 sec TUG and 1.66 ft/sec    Status  On-going      PT LONG TERM GOAL #3   Title  Pt will be able to easier negotiate stairs while carrying something in one hand.    Status  On-going            Plan - 03/28/19 2023    Clinical Impression Statement  continued with stretching program favoring lumbar flexion due to stenosis. Added additional balance activities today as she continues to be unsteady with gait. PT will continue to progress as able and will assess STG next session    Personal Factors and Comorbidities  Comorbidity 1;Comorbidity 2;Comorbidity 3+    Comorbidities  PMH diabetes with neuropathy, obesity, hypertension, hyperlipidemia, osteoporosis,Rt foot surgery for midfoot osteotomy, arthrodesis and achilles lengthening in 2016.    Examination-Activity Limitations  Bend;Locomotion Level;Carry;Stairs;Stand;Lift    Examination-Participation Restrictions  Meal Prep;Cleaning;Community Activity;Driving;Laundry    Stability/Clinical Decision Making  Evolving/Moderate complexity    Rehab Potential  Good    PT Frequency  2x / week    PT Duration  8 weeks    PT Treatment/Interventions  ADLs/Self Care Home Management;Aquatic Therapy;Cryotherapy;Moist Heat;Traction;Ultrasound;Gait training;Functional mobility training;Therapeutic activities;Therapeutic exercise;Balance training;Neuromuscular re-education;Orthotic Fit/Training;Manual techniques;Passive range of motion;Dry needling;Taping;Joint Manipulations    PT Next Visit Plan  assess STG, functional LE strengthening, step through gait with SPC, corner balance exercises,   work on stairs while carrying something in one hand    PT Home Exercise Plan  Access Code: [email or web address]     Consulted and Agree with Plan of Care  Patient       Patient will benefit from skilled therapeutic intervention in order to improve the following deficits and impairments:  Abnormal gait, Decreased activity tolerance, Decreased balance, Decreased coordination, Decreased range of motion, Decreased strength, Difficulty walking, Impaired flexibility, Postural dysfunction, Pain, Obesity  Visit Diagnosis: Other abnormalities of gait and mobility  Muscle weakness (generalized)  Chronic bilateral low back pain without sciatica  Pain in right foot     Problem  List Patient Active Problem List   Diagnosis Date Noted  . Spinal stenosis of lumbar region 03/10/2019  . Cervical myelopathy ([place]) 03/10/2019  . Peripheral neuropathy 02/02/2019  . Gait abnormality 02/02/2019  . Urinary urgency 02/02/2019  . Chronic bilateral low back pain without sciatica 02/02/2019  . DOE (dyspnea on exertion) 10/05/2017  . Bruit of right carotid artery 10/05/2017  . Coronary artery calcification seen on CT scan 10/05/2017  . Chest pain 10/04/2017  . Aortic atherosclerosis ([place]) 10/04/2017  . Charcot foot due to diabetes mellitus ([place]) 11/23/2014    Silvestre Mesi 03/28/2019, 8:25 PM  [facility] [address] [place], [place], [address] Phone: [phone]   Fax:  [phone]  Name: Pamela Snyder MRN: [id number] Date of Birth: 08-08-1945

## 2019-03-29 ENCOUNTER — Telehealth: Payer: Self-pay | Admitting: Neurology

## 2019-03-29 NOTE — Telephone Encounter (Signed)
Her NCV/EMG has been canceled.  The patient states she is waiting on MRI thoracic to be scheduled (ordered by Dr. Arnoldo Morale).  She is going to speak to Dr. Arnoldo Morale' office again concerning the recommended surgery.  States she may want a second neurosurgical opinion.  Per Dr. Krista Blue, she needs to move forward as soon as possible based on her MRI results.  The patient verbalized understanding of risks and will make a decision.  She will call us back once her surgery is scheduled to make a follow up with Dr. Krista Blue.

## 2019-03-29 NOTE — Telephone Encounter (Signed)
Reviewed neurosurgery evaluation by Dr. Newman Pies on March 22, 2019, MRI of thoracic spine  Planning on to proceed with C5-6 anterior cervical discectomy, fusion, and plating, to decompress her cervical cord first  Cancel her EMG nerve conduction study April 18, 2019, please give patient a follow-up visit after her decompression surgery

## 2019-03-30 ENCOUNTER — Other Ambulatory Visit: Payer: Self-pay

## 2019-03-30 ENCOUNTER — Ambulatory Visit: Payer: Medicare Other | Admitting: Physical Therapy

## 2019-03-30 DIAGNOSIS — R2689 Other abnormalities of gait and mobility: Secondary | ICD-10-CM | POA: Diagnosis not present

## 2019-03-30 DIAGNOSIS — M6281 Muscle weakness (generalized): Secondary | ICD-10-CM | POA: Diagnosis not present

## 2019-03-30 DIAGNOSIS — M545 Low back pain: Secondary | ICD-10-CM | POA: Diagnosis not present

## 2019-03-30 DIAGNOSIS — G8929 Other chronic pain: Secondary | ICD-10-CM

## 2019-03-30 DIAGNOSIS — M79671 Pain in right foot: Secondary | ICD-10-CM | POA: Diagnosis not present

## 2019-03-30 NOTE — Patient Instructions (Signed)
Access Code: [email or web address]  URL: [email or web address]  Date: 03/30/2019  Prepared by: Elsie Ra   Exercises  Seated Cervical Retraction - 10 reps - 3 sets - 2x daily - 6x weekly  Backward Walking with Counter Support - 3-5 reps - 1 sets - 2x daily - 6x weekly  Standing Tandem Balance with Counter Support - 3 reps - 1 sets - 30 hold - 2x daily - 6x weekly  Heel Toe Raises with Counter Support - 10 reps - 2 sets - 2x daily - 6x weekly  Hooklying Single Knee to Chest - 3 reps - 1 sets - 30 hold - 2x daily - 6x weekly

## 2019-03-31 NOTE — Therapy (Signed)
[facility] [address] [place], [place], [address] Phone: [phone]   Fax:  [phone]  Physical Therapy Treatment/Discharge  PHYSICAL THERAPY DISCHARGE SUMMARY  Visits from Start of Care: 9  Current functional level related to goals / functional outcomes: See below   Remaining deficits: See below   Education / Equipment: HEP Plan: Patient agrees to discharge.  Patient goals were partially met. Patient is being discharged due to a change in medical status.  ?????  She will need upcoming spinal surgery     Patient Details  Name: Pamela Snyder MRN: [id number] Date of Birth: March 25, 1946 Referring Provider (PT): Marcial Pacas, MD   Encounter Date: 03/30/2019  PT End of Session - 03/31/19 0607    Visit Number  9    Number of Visits  16    Date for PT Re-Evaluation  04/20/19    Authorization Type  MCR    PT Start Time  1400    PT Stop Time  1445    PT Time Calculation (min)  45 min    Activity Tolerance  Patient tolerated treatment well    Behavior During Therapy  [facility] for tasks assessed/performed       Past Medical History:  Diagnosis Date  . Arthritis    "right foot; spine; hands" (11/23/2014)  . Charcot foot due to diabetes mellitus ([place])   . GERD (gastroesophageal reflux disease)   . Gout   . Hyperlipemia   . Hypertension   . Migraine    hx  . Neuropathy   . Numbness   . Thyroid goiter 1986  . Type II diabetes mellitus ([place])     Past Surgical History:  Procedure Laterality Date  . ABDOMINAL HYSTERECTOMY  1999   w/BSO  . ACHILLES TENDON LENGTHENING Right 11/23/2014  . ACHILLES TENDON LENGTHENING Right 11/23/2014   Procedure: RIGHT ACHILLES PERCUTANEOUS TENDON LENGTHENING;  Surgeon: Wylene Simmer, MD;  Location: [facility];  Service: Orthopedics;  Laterality: Right;  . APPENDECTOMY  1963  . ARTHRODESIS Right 11/23/2014   mid foot  . CARDIAC CATHETERIZATION  08/2004  . FOOT ARTHRODESIS Right  11/23/2014   Procedure: RIGHT MID FOOT ARTHRODESIS;  Surgeon: Wylene Simmer, MD;  Location: [facility];  Service: Orthopedics;  Laterality: Right;  . METATARSAL OSTEOTOMY Right 11/23/2014   Procedure: RIGHT MID FOOT OSTEOTOMY;  Surgeon: Wylene Simmer, MD;  Location: [facility];  Service: Orthopedics;  Laterality: Right;  . OSTEOTOMY Right 11/23/2014   mid foot  . PARTIAL THYMECTOMY  1986   ? side  . TONSILLECTOMY  1954    There were no vitals filed for this visit.  Subjective Assessment - 03/31/19 0606    Subjective  She says she will need surgery so we wants to cancel the remaining appointments she has and perform HEP for now. She has widespread pain today 6/10 in back, legs, feet    Pertinent History  PMH diabetes with neuropathy, obesity, hypertension, hyperlipidemia, osteoporosis,Rt foot surgery for midfoot osteotomy, arthrodesis and achilles lengthening in 2016.    Limitations  Lifting;Standing;Walking;House hold activities;Sitting    Currently in Pain?  Yes       Treatment:  nu step 10 min with PT present to discuss POC recommendations stretching for SKTC, DKTC, prirformis stretch, pelvic tilts updated and revised HEP to perform at home and this was reviewed with her consisting of SKTC stretch, DKTC stretch, chin tucks, tandem balance, retro walk (both with intermitt UE support as needed) , and standing  heel toe raises, PT will cancel appointments while she awaits surgery.   PT Education - 03/31/19 0607    Education Details  HEP revision    Person(s) Educated  Patient    Methods  Explanation;Demonstration;Verbal cues;Handout    Comprehension  Verbalized understanding;Returned demonstration       PT Short Term Goals - 03/31/19 1572      PT SHORT TERM GOAL #1   Title  Pt will be I and compliant with HEP (all STG 4 weeks 03/27/19)    Status  Achieved      PT SHORT TERM GOAL #2   Title  Pt will improve BERG balance at least 3 points to show improved balance    Status  Achieved      PT  SHORT TERM GOAL #3   Title  Pt will improve TUG score with LRAD by >2 seconds to show improved balance and gait speed    Baseline  16    Status  Achieved        PT Long Term Goals - 03/31/19 0610      PT LONG TERM GOAL #1   Title  Pt will improve BERG balance without AD to >21 to show improved balance. (Time for all LTG 8 weeks 04/20/19)    Baseline  15    Status  On-going      PT LONG TERM GOAL #2   Title  Pt will improve gait speed and TUG time to show improved gait and balance    Baseline  16 sec TUG and 1.66 ft/sec    Status  On-going      PT LONG TERM GOAL #3   Title  Pt will be able to easier negotiate stairs while carrying something in one hand.    Status  On-going            Plan - 03/31/19 6203    Clinical Impression Statement  She made some progress with PT and overall decreased pain and improved strength but still having pain and balance deficits with ataxic gait at times. She will need surgery due to severe stenosis so she will discharge from PT at this time. She will likely need PT after surgery so we will start new episode of care then.    Personal Factors and Comorbidities  Comorbidity 1;Comorbidity 2;Comorbidity 3+    Comorbidities  PMH diabetes with neuropathy, obesity, hypertension, hyperlipidemia, osteoporosis,Rt foot surgery for midfoot osteotomy, arthrodesis and achilles lengthening in 2016.    Examination-Activity Limitations  Bend;Locomotion Level;Carry;Stairs;Stand;Lift    Examination-Participation Restrictions  Meal Prep;Cleaning;Community Activity;Driving;Laundry    Stability/Clinical Decision Making  Evolving/Moderate complexity    Rehab Potential  Good    PT Frequency  2x / week    PT Duration  8 weeks    PT Treatment/Interventions  ADLs/Self Care Home Management;Aquatic Therapy;Cryotherapy;Moist Heat;Traction;Ultrasound;Gait training;Functional mobility training;Therapeutic activities;Therapeutic exercise;Balance training;Neuromuscular  re-education;Orthotic Fit/Training;Manual techniques;Passive range of motion;Dry needling;Taping;Joint Manipulations    PT Next Visit Plan  assess STG, functional LE strengthening, step through gait with SPC, corner balance exercises,   work on stairs while carrying something in one hand    PT Home Exercise Plan  Access Code: [email or web address]    Consulted and Agree with Plan of Care  Patient       Patient will benefit from skilled therapeutic intervention in order to improve the following deficits and impairments:  Abnormal gait, Decreased activity tolerance, Decreased balance, Decreased coordination, Decreased range of motion, Decreased strength, Difficulty walking, Impaired flexibility,  Postural dysfunction, Pain, Obesity  Visit Diagnosis: Other abnormalities of gait and mobility  Muscle weakness (generalized)  Chronic bilateral low back pain without sciatica  Pain in right foot     Problem List Patient Active Problem List   Diagnosis Date Noted  . Spinal stenosis of lumbar region 03/10/2019  . Cervical myelopathy ([place]) 03/10/2019  . Peripheral neuropathy 02/02/2019  . Gait abnormality 02/02/2019  . Urinary urgency 02/02/2019  . Chronic bilateral low back pain without sciatica 02/02/2019  . DOE (dyspnea on exertion) 10/05/2017  . Bruit of right carotid artery 10/05/2017  . Coronary artery calcification seen on CT scan 10/05/2017  . Chest pain 10/04/2017  . Aortic atherosclerosis ([place]) 10/04/2017  . Charcot foot due to diabetes mellitus ([place]) 11/23/2014    Silvestre Mesi 03/31/2019, 6:11 AM  [facility] [address] [place], [place], [address] Phone: [phone]   Fax:  [phone]  Name: MALAIAH VIRAMONTES MRN: [id number] Date of Birth: 09/04/1945

## 2019-04-01 ENCOUNTER — Other Ambulatory Visit: Payer: Self-pay | Admitting: Neurosurgery

## 2019-04-01 DIAGNOSIS — S22080A Wedge compression fracture of T11-T12 vertebra, initial encounter for closed fracture: Secondary | ICD-10-CM

## 2019-04-04 ENCOUNTER — Ambulatory Visit: Payer: Medicare Other | Admitting: Physical Therapy

## 2019-04-04 DIAGNOSIS — D729 Disorder of white blood cells, unspecified: Secondary | ICD-10-CM | POA: Diagnosis not present

## 2019-04-04 DIAGNOSIS — E876 Hypokalemia: Secondary | ICD-10-CM | POA: Diagnosis not present

## 2019-04-04 DIAGNOSIS — M810 Age-related osteoporosis without current pathological fracture: Secondary | ICD-10-CM | POA: Diagnosis not present

## 2019-04-04 DIAGNOSIS — M62838 Other muscle spasm: Secondary | ICD-10-CM | POA: Diagnosis not present

## 2019-04-04 DIAGNOSIS — E1143 Type 2 diabetes mellitus with diabetic autonomic (poly)neuropathy: Secondary | ICD-10-CM | POA: Diagnosis not present

## 2019-04-06 ENCOUNTER — Ambulatory Visit: Payer: Medicare Other | Admitting: Physical Therapy

## 2019-04-11 ENCOUNTER — Ambulatory Visit: Payer: Medicare Other | Admitting: Physical Therapy

## 2019-04-12 DIAGNOSIS — R05 Cough: Secondary | ICD-10-CM | POA: Diagnosis not present

## 2019-04-13 ENCOUNTER — Ambulatory Visit: Payer: Medicare Other | Admitting: Physical Therapy

## 2019-04-18 ENCOUNTER — Encounter: Payer: Medicare Other | Admitting: Neurology

## 2019-04-20 ENCOUNTER — Ambulatory Visit: Payer: Medicare Other | Admitting: Physical Therapy

## 2019-04-22 ENCOUNTER — Ambulatory Visit: Payer: Medicare Other | Admitting: Physical Therapy

## 2019-04-25 ENCOUNTER — Ambulatory Visit
Admission: RE | Admit: 2019-04-25 | Discharge: 2019-04-25 | Disposition: A | Discharge status: Home / Self Care | Payer: Medicare Other | Source: Ambulatory Visit | Attending: Neurosurgery | Admitting: Neurosurgery

## 2019-04-25 ENCOUNTER — Other Ambulatory Visit: Payer: Self-pay

## 2019-04-25 DIAGNOSIS — M5134 Other intervertebral disc degeneration, thoracic region: Secondary | ICD-10-CM | POA: Diagnosis not present

## 2019-04-25 DIAGNOSIS — S22080A Wedge compression fracture of T11-T12 vertebra, initial encounter for closed fracture: Secondary | ICD-10-CM

## 2019-05-03 DIAGNOSIS — E559 Vitamin D deficiency, unspecified: Secondary | ICD-10-CM | POA: Diagnosis not present

## 2019-05-03 DIAGNOSIS — M810 Age-related osteoporosis without current pathological fracture: Secondary | ICD-10-CM | POA: Diagnosis not present

## 2019-05-13 ENCOUNTER — Other Ambulatory Visit: Payer: Self-pay | Admitting: Family Medicine

## 2019-05-13 DIAGNOSIS — M810 Age-related osteoporosis without current pathological fracture: Secondary | ICD-10-CM

## 2019-05-17 ENCOUNTER — Telehealth: Payer: Self-pay | Admitting: Neurology

## 2019-05-17 DIAGNOSIS — R269 Unspecified abnormalities of gait and mobility: Secondary | ICD-10-CM

## 2019-05-17 DIAGNOSIS — G959 Disease of spinal cord, unspecified: Secondary | ICD-10-CM

## 2019-05-17 DIAGNOSIS — M48061 Spinal stenosis, lumbar region without neurogenic claudication: Secondary | ICD-10-CM

## 2019-05-17 NOTE — Telephone Encounter (Signed)
Pt called needing to discuss several things with the RN including a second opinion on a Neuro Surgeon at [facility] Please advise.

## 2019-05-17 NOTE — Telephone Encounter (Signed)
I called the patient back.  She had her initial evaluation with Dr. Arnoldo Morale.  She would like a second opinion with Dr. Myrtie Neither at [facility].  Dr. Arnoldo Morale requested she call here to request the referral for the second opinion.  She will get her MRI scans on a disc to take with her.

## 2019-05-18 ENCOUNTER — Other Ambulatory Visit: Payer: Self-pay

## 2019-05-18 ENCOUNTER — Ambulatory Visit
Admission: RE | Admit: 2019-05-18 | Discharge: 2019-05-18 | Disposition: A | Discharge status: Home / Self Care | Payer: Medicare Other | Source: Ambulatory Visit | Attending: Family Medicine | Admitting: Family Medicine

## 2019-05-18 DIAGNOSIS — M85851 Other specified disorders of bone density and structure, right thigh: Secondary | ICD-10-CM | POA: Diagnosis not present

## 2019-05-18 DIAGNOSIS — Z780 Asymptomatic menopausal state: Secondary | ICD-10-CM | POA: Diagnosis not present

## 2019-05-18 DIAGNOSIS — M810 Age-related osteoporosis without current pathological fracture: Secondary | ICD-10-CM

## 2019-05-18 NOTE — Addendum Note (Signed)
Addended by: Marcial Pacas on: 05/18/2019 10:46 AM   Modules accepted: Orders

## 2019-05-18 NOTE — Telephone Encounter (Signed)
The patient is aware the referral has been placed.

## 2019-05-18 NOTE — Telephone Encounter (Signed)
I have put in refer to neurosurgeon Dr. Myrtie Neither at [facility]

## 2019-05-20 ENCOUNTER — Telehealth: Payer: Self-pay | Admitting: Oncology

## 2019-05-20 NOTE — Telephone Encounter (Signed)
Received a new hem from Pamela Snyder for increased wbc. Pt returned my call and has been scheduled to see Dr. Alen Blew on 11/17 at 2pm. Pt aware to arrive 15 minutes early.

## 2019-05-24 ENCOUNTER — Inpatient Hospital Stay: Payer: Medicare Other | Attending: Oncology | Admitting: Oncology

## 2019-05-24 ENCOUNTER — Other Ambulatory Visit: Payer: Self-pay

## 2019-05-24 VITALS — BP 116/71 | HR 99 | Temp 98.4°F | Resp 18 | Ht 65.0 in | Wt 230.7 lb

## 2019-05-24 DIAGNOSIS — Z87891 Personal history of nicotine dependence: Secondary | ICD-10-CM | POA: Diagnosis not present

## 2019-05-24 DIAGNOSIS — Z7982 Long term (current) use of aspirin: Secondary | ICD-10-CM

## 2019-05-24 DIAGNOSIS — Z7984 Long term (current) use of oral hypoglycemic drugs: Secondary | ICD-10-CM | POA: Diagnosis not present

## 2019-05-24 DIAGNOSIS — Z79899 Other long term (current) drug therapy: Secondary | ICD-10-CM | POA: Diagnosis not present

## 2019-05-24 DIAGNOSIS — Z800 Family history of malignant neoplasm of digestive organs: Secondary | ICD-10-CM | POA: Diagnosis not present

## 2019-05-24 DIAGNOSIS — Z90722 Acquired absence of ovaries, bilateral: Secondary | ICD-10-CM

## 2019-05-24 DIAGNOSIS — D72829 Elevated white blood cell count, unspecified: Secondary | ICD-10-CM

## 2019-05-24 DIAGNOSIS — I10 Essential (primary) hypertension: Secondary | ICD-10-CM | POA: Diagnosis not present

## 2019-05-24 DIAGNOSIS — Z9079 Acquired absence of other genital organ(s): Secondary | ICD-10-CM

## 2019-05-24 DIAGNOSIS — Z8249 Family history of ischemic heart disease and other diseases of the circulatory system: Secondary | ICD-10-CM

## 2019-05-24 DIAGNOSIS — Z90710 Acquired absence of both cervix and uterus: Secondary | ICD-10-CM

## 2019-05-24 DIAGNOSIS — E785 Hyperlipidemia, unspecified: Secondary | ICD-10-CM | POA: Diagnosis not present

## 2019-05-24 DIAGNOSIS — E119 Type 2 diabetes mellitus without complications: Secondary | ICD-10-CM | POA: Diagnosis not present

## 2019-05-24 NOTE — Progress Notes (Signed)
Reason for the request:   Leukocytosis  HPI: I was asked by [name], PA-C to evaluate Pamela Snyder for the evaluation of leukocytosis.  She is a 73 year old woman with history of arthritis, hyperlipidemia and hypertension and found to have leukocytosis on CBC obtained on 04/04/2019.  At that time her white cell count was 17.1 with a hemoglobin of 14.0 and a platelet count of 369.  Her differential is normal including neutrophils, lymphocytes and monocytes.  Remaining blood testing was otherwise unremarkable.  Previous CBCs dating back to 2016 showed a white cell count of 16,000, hemoglobin of 12 and a platelet count of 345.  She has been diagnosed with spinal stenosis and bulging disc based on her lumbar and cervical spine and currently under evaluation for possible intervention.  He is a diabetic with a possible neuropathy and is a former smoker but has not smoked for close to 18 years.  She does not report any headaches, blurry vision, syncope or seizures. Does not report any fevers, chills or sweats.  Does not report any cough, wheezing or hemoptysis.  Does not report any chest pain, palpitation, orthopnea or leg edema.  Does not report any nausea, vomiting or abdominal pain.  Does not report any constipation or diarrhea.  Does not report any skeletal complaints.    Does not report frequency, urgency or hematuria.  Does not report any skin rashes or lesions. Does not report any heat or cold intolerance.  Does not report any lymphadenopathy or petechiae.  Does not report any anxiety or depression.  Remaining review of systems is negative.    Past Medical History:  Diagnosis Date  . Arthritis    "right foot; spine; hands" (11/23/2014)  . Charcot foot due to diabetes mellitus ([place])   . GERD (gastroesophageal reflux disease)   . Gout   . Hyperlipemia   . Hypertension   . Migraine    hx  . Neuropathy   . Numbness   . Thyroid goiter 1986  . Type II diabetes mellitus ([place])   :  Past  Surgical History:  Procedure Laterality Date  . ABDOMINAL HYSTERECTOMY  1999   w/BSO  . ACHILLES TENDON LENGTHENING Right 11/23/2014  . ACHILLES TENDON LENGTHENING Right 11/23/2014   Procedure: RIGHT ACHILLES PERCUTANEOUS TENDON LENGTHENING;  Surgeon: Wylene Simmer, MD;  Location: [facility];  Service: Orthopedics;  Laterality: Right;  . APPENDECTOMY  1963  . ARTHRODESIS Right 11/23/2014   mid foot  . CARDIAC CATHETERIZATION  08/2004  . FOOT ARTHRODESIS Right 11/23/2014   Procedure: RIGHT MID FOOT ARTHRODESIS;  Surgeon: Wylene Simmer, MD;  Location: [facility];  Service: Orthopedics;  Laterality: Right;  . METATARSAL OSTEOTOMY Right 11/23/2014   Procedure: RIGHT MID FOOT OSTEOTOMY;  Surgeon: Wylene Simmer, MD;  Location: [facility];  Service: Orthopedics;  Laterality: Right;  . OSTEOTOMY Right 11/23/2014   mid foot  . PARTIAL THYMECTOMY  1986   ? side  . TONSILLECTOMY  1954  :   Current Outpatient Medications:  .  allopurinol (ZYLOPRIM) 100 MG tablet, Take 200 mg by mouth daily., Disp: , Rfl:  .  aspirin EC 81 MG tablet, Take 1 tablet (81 mg total) by mouth daily., Disp: 90 tablet, Rfl: 3 .  Calcium Carbonate-Vitamin D (OSCAL 500/200 D-3 PO), Take 1 tablet by mouth daily., Disp: , Rfl:  .  Cholecalciferol 1000 UNITS capsule, Take 5,000 Units by mouth daily. , Disp: , Rfl:  .  esomeprazole (NEXIUM) 20 MG capsule, Take 20 mg by  mouth daily at 12 noon., Disp: , Rfl:  .  furosemide (LASIX) 20 MG tablet, Take 20 mg by mouth daily as needed for fluid., Disp: , Rfl:  .  glipiZIDE (GLUCOTROL XL) 5 MG 24 hr tablet, Take 5 mg by mouth daily., Disp: , Rfl: 3 .  losartan-hydrochlorothiazide (HYZAAR) 100-25 MG tablet, Take half (1/2) tablet by mouth daily., Disp: , Rfl: 2 .  LYRICA 75 MG capsule, Take 75 mg by mouth 4 (four) times daily., Disp: , Rfl: 1 .  metoprolol succinate (TOPROL-XL) 100 MG 24 hr tablet, Take 100 mg by mouth daily. Take with or immediately following a meal., Disp: , Rfl:  .  Multiple Vitamins-Minerals  (CENTRUM SILVER ULTRA WOMENS PO), Take 1 tablet by mouth daily., Disp: , Rfl:  .  potassium chloride (KLOR-CON) 20 MEQ packet, Take 20 mEq by mouth daily., Disp: , Rfl:  .  potassium chloride SA (K-DUR,KLOR-CON) 20 MEQ tablet, Take 20 mEq by mouth daily as needed (take with lasix for fluid retention). , Disp: , Rfl:  .  rosuvastatin (CRESTOR) 5 MG tablet, Take 1 tablet (5 mg total) by mouth daily. Please make overdue appt with Dr. Tamala Julian before anymore refills. 3rd attempt, Disp: 90 tablet, Rfl: 1:  No Known Allergies:  Family History  Problem Relation Age of Onset  . Heart attack Mother        died at 41  . Heart attack Father        died at 66  . Alcoholism Father   . Colon cancer Maternal Grandmother   . Heart attack Maternal Grandmother   . Diabetes Maternal Grandmother   . Stroke Maternal Grandfather   . Diabetes Paternal Grandmother   . Diabetes Paternal Grandfather   :  Social History   Socioeconomic History  . Marital status: Married    Spouse name: Not on file  . Number of children: 0  . Years of education: college  . Highest education level: Master's degree (e.g., MA, MS, MEng, MEd, MSW, MBA)  Occupational History  . Occupation: Retired  Scientific laboratory technician  . Financial resource strain: Not on file  . Food insecurity    Worry: Not on file    Inability: Not on file  . Transportation needs    Medical: Not on file    Non-medical: Not on file  Tobacco Use  . Smoking status: Former Smoker    Packs/day: 1.00    Years: 37.00    Pack years: 37.00    Types: Cigarettes    Quit date: 02/2003    Years since quitting: 16.3  . Smokeless tobacco: Never Used  Substance and Sexual Activity  . Alcohol use: No  . Drug use: No  . Sexual activity: Yes  Lifestyle  . Physical activity    Days per week: Not on file    Minutes per session: Not on file  . Stress: Not on file  Relationships  . Social Herbalist on phone: Not on file    Gets together: Not on file     Attends religious service: Not on file    Active member of club or organization: Not on file    Attends meetings of clubs or organizations: Not on file    Relationship status: Not on file  . Intimate partner violence    Fear of current or ex partner: Not on file    Emotionally abused: Not on file    Physically abused: Not on file  Forced sexual activity: Not on file  Other Topics Concern  . Not on file  Social History Narrative   Lives at home with her husband.   Four cans of Coke per day.   Right-handed.   She has three stepdaughters.  :  Pertinent items are noted in HPI.  Exam: ECOG  General appearance: alert and cooperative appeared without distress. Head: atraumatic without any abnormalities. Eyes: conjunctivae/corneas clear. PERRL.  Sclera anicteric. Throat: lips, mucosa, and tongue normal; without oral thrush or ulcers. Resp: clear to auscultation bilaterally without rhonchi, wheezes or dullness to percussion. Cardio: regular rate and rhythm, S1, S2 normal, no murmur, click, rub or gallop GI: soft, non-tender; bowel sounds normal; no masses,  no organomegaly Skin: Skin color, texture, turgor normal. No rashes or lesions Lymph nodes: Cervical, supraclavicular, and axillary nodes normal. Neurologic: Grossly normal without any motor, sensory or deep tendon reflexes. Musculoskeletal: No joint deformity or effusion.    Mr Thoracic Spine Wo Contrast  Result Date: 04/25/2019 CLINICAL DATA:  Back pain and left leg pain with weakness in both legs for 1 year. EXAM: MRI THORACIC SPINE WITHOUT CONTRAST TECHNIQUE: Multiplanar, multisequence MR imaging of the thoracic spine was performed. No intravenous contrast was administered. COMPARISON:  CT scan of the chest dated 08/17/2017 and MRI of the lumbar spine dated 03/08/2019 FINDINGS: Alignment:  Physiologic. Vertebrae: Old compression fracture of superior endplate of K93 with slight protrusion of the posterosuperior margin of T12  into the spinal canal, unchanged since the prior CT scan dated 08/17/2017. Cord:  Normal signal and morphology. Paraspinal and other soft tissues: Negative. Disc levels: T1-2: Normal. T2-3 through T5-6: Disc space narrowing with tiny broad-based disc bulges with no neural impingement. T6-7 through T10-11: Disc space narrowing with slightly more prominent broad-based disc protrusions without focal neural impingement. Degenerative changes of the vertebral endplates particularly at T8-9 and T9-10. T11-12: Old healed compression fracture of the superior endplate of G18 with slight protrusion of the posterior margin of T12 and the disc into the spinal canal touching the ventral aspect of the spinal cord but without neural impingement. No foraminal stenosis. IMPRESSION: 1. Old healed compression fracture of the superior endplate of E99 with no neural impingement. 2. Diffuse degenerative disc disease in the thoracic spine without focal neural impingement. Electronically Signed   By: Lorriane Shire M.D.   On: 04/25/2019 15:49   Dg Bone Density (dxa)  Result Date: 05/18/2019 EXAM: DUAL X-RAY ABSORPTIOMETRY (DXA) FOR BONE MINERAL DENSITY IMPRESSION: Referring Physician:  [name] Your patient completed a BMD test using Lunar IDXA DXA system ( analysis version: 16 ) manufactured by [employer]. Technologist: CG PATIENT: Name: Pamela Snyder, Pamela Snyder Patient ID: [id number] Birth Date: 1945/09/29 Height: 64.0 in. Sex: Female Measured: 05/18/2019 Weight: 228.8 lbs. Indications: Advanced Age, Bilateral Ovariectomy (65.51), Caucasian, Estrogen Deficient, Hysterectomy, Nexium, Postmenopausal Fractures: None Treatments: Calcium (E943.0), Multivitamin, Prolia, Vitamin D (E933.5) ASSESSMENT: The BMD measured at Femur Neck Right is 0.804 g/cm2 with a T-score of -1.7. This patient is considered osteopenic according to [facility]) criteria. The scan quality is good. Lumbar spine was not utilized due to advanced  degenerative changes. Patient does not meet criteria for FRAX due to use of Prolia. Site Region Measured Date Measured Age YA BMD Significant CHANGE T-score DualFemur Neck Right 05/18/2019 73.0 -1.8 0.804 g/cm2 DualFemur Total Mean 05/18/2019 72.0 -0.7 0.914 g/cm2 Left Forearm Radius 33% 05/18/2019 73.0 -0.1 0.875 g/cm2 World Health Organization [facility]) criteria for post-menopausal, Caucasian Women: Normal  T-score at or above -1 SD Osteopenia   T-score between -1 and -2.5 SD Osteoporosis T-score at or below -2.5 SD RECOMMENDATION: 1. All patients should optimize calcium and vitamin D intake. 2. Consider FDA approved medical therapies in postmenopausal women and men aged 56 years and older, based on the following: a. A hip or vertebral (clinical or morphometric) fracture b. T- score < or = -2.5 at the femoral neck or spine after appropriate evaluation to exclude secondary causes c. Low bone mass (T-score between -1.0 and -2.5 at the femoral neck or spine) and a 10 year probability of a hip fracture > or = 3% or a 10 year probability of a major osteoporosis-related fracture > or = 20% based on the [place]-adapted WHO algorithm d. Clinician judgment and/or patient preferences may indicate treatment for people with 10-year fracture probabilities above or below these levels FOLLOW-UP: Patients with diagnosis of osteoporosis or at high risk for fracture should have regular bone mineral density tests. For patients eligible for Medicare, routine testing is allowed once every 2 years. The testing frequency can be increased to one year for patients who have rapidly progressing disease, those who are receiving or discontinuing medical therapy to restore bone mass, or have additional risk factors. Electronically Signed   By: Marlaine Hind M.D.   On: 05/18/2019 15:10    Assessment and Plan:   73 year old woman with:  1.  Leukocytosis with normal differential dating back to 2016.  Her white cell count at that time was 16,000  and CBC obtained and September 2020 showed a white cell count of 17,000 and a normal differential.  Her CBC was otherwise normal.  The differential diagnosis was reviewed today and it appears to be likely reactive in nature.  Chronic inflammatory conditions, arthritis, as well as smoking could play a role.  Myeloproliferative disorder such as chronic myelogenous leukemia or myelofibrosis are considered less likely given the pattern her white cell count elevation.  Lymphoproliferative disorder such as lymphoma or leukemia are considered unlikely.  From a management standpoint, I have recommended monitoring at this time without any further intervention or work-up.  I see no need for a bone marrow biopsy or imaging studies.  I will obtain molecular testing to rule out chronic myelogenous leukemia or other myeloproliferative disorders for completeness.  All her questions were answered to her satisfaction.  2.  Follow-up: Will be in 4 months for repeat evaluation and repeat laboratory testing.   40  minutes was spent with the patient face-to-face today.  More than 50% of time was spent on reviewing laboratory data, discussing differential diagnosis and management options.     Thank you for the referral.  I had the pleasure of meeting this patient today.  A copy of this consult has been forwarded to the requesting physician.

## 2019-05-25 ENCOUNTER — Telehealth: Payer: Self-pay | Admitting: Oncology

## 2019-05-25 NOTE — Telephone Encounter (Signed)
Scheduled appt per 11/17 los. ° °Printed and mailed appt calendar. °

## 2019-06-06 ENCOUNTER — Other Ambulatory Visit: Payer: Self-pay

## 2019-06-06 NOTE — Telephone Encounter (Signed)
error 

## 2019-09-13 ENCOUNTER — Inpatient Hospital Stay: Payer: Medicare Other

## 2019-09-27 ENCOUNTER — Ambulatory Visit: Payer: Medicare Other | Admitting: Oncology

## 2019-10-04 ENCOUNTER — Inpatient Hospital Stay: Payer: Medicare Other | Attending: Oncology

## 2019-10-04 ENCOUNTER — Other Ambulatory Visit: Payer: Self-pay

## 2019-10-04 DIAGNOSIS — Z8249 Family history of ischemic heart disease and other diseases of the circulatory system: Secondary | ICD-10-CM | POA: Insufficient documentation

## 2019-10-04 DIAGNOSIS — Z9079 Acquired absence of other genital organ(s): Secondary | ICD-10-CM | POA: Insufficient documentation

## 2019-10-04 DIAGNOSIS — D72829 Elevated white blood cell count, unspecified: Secondary | ICD-10-CM | POA: Diagnosis not present

## 2019-10-04 DIAGNOSIS — Z90710 Acquired absence of both cervix and uterus: Secondary | ICD-10-CM | POA: Diagnosis not present

## 2019-10-04 DIAGNOSIS — E119 Type 2 diabetes mellitus without complications: Secondary | ICD-10-CM | POA: Diagnosis not present

## 2019-10-04 DIAGNOSIS — Z800 Family history of malignant neoplasm of digestive organs: Secondary | ICD-10-CM | POA: Diagnosis not present

## 2019-10-04 DIAGNOSIS — Z7982 Long term (current) use of aspirin: Secondary | ICD-10-CM | POA: Insufficient documentation

## 2019-10-04 DIAGNOSIS — E785 Hyperlipidemia, unspecified: Secondary | ICD-10-CM | POA: Diagnosis not present

## 2019-10-04 DIAGNOSIS — Z79899 Other long term (current) drug therapy: Secondary | ICD-10-CM | POA: Diagnosis not present

## 2019-10-04 DIAGNOSIS — Z90722 Acquired absence of ovaries, bilateral: Secondary | ICD-10-CM | POA: Diagnosis not present

## 2019-10-04 DIAGNOSIS — Z7984 Long term (current) use of oral hypoglycemic drugs: Secondary | ICD-10-CM | POA: Diagnosis not present

## 2019-10-04 DIAGNOSIS — Z87891 Personal history of nicotine dependence: Secondary | ICD-10-CM | POA: Insufficient documentation

## 2019-10-04 DIAGNOSIS — I10 Essential (primary) hypertension: Secondary | ICD-10-CM | POA: Insufficient documentation

## 2019-10-04 LAB — CBC WITH DIFFERENTIAL (CANCER CENTER ONLY)
Abs Immature Granulocytes: 0.06 10*3/uL (ref 0.00–0.07)
Basophils Absolute: 0.1 10*3/uL (ref 0.0–0.1)
Basophils Relative: 1 %
Eosinophils Absolute: 0.5 10*3/uL (ref 0.0–0.5)
Eosinophils Relative: 3 %
HCT: 38 % (ref 36.0–46.0)
Hemoglobin: 12.8 g/dL (ref 12.0–15.0)
Immature Granulocytes: 0 %
Lymphocytes Relative: 31 %
Lymphs Abs: 4.2 10*3/uL — ABNORMAL HIGH (ref 0.7–4.0)
MCH: 30.6 pg (ref 26.0–34.0)
MCHC: 33.7 g/dL (ref 30.0–36.0)
MCV: 90.9 fL (ref 80.0–100.0)
Monocytes Absolute: 0.8 10*3/uL (ref 0.1–1.0)
Monocytes Relative: 6 %
Neutro Abs: 8.3 10*3/uL — ABNORMAL HIGH (ref 1.7–7.7)
Neutrophils Relative %: 59 %
Platelet Count: 319 10*3/uL (ref 150–400)
RBC: 4.18 MIL/uL (ref 3.87–5.11)
RDW: 13.2 % (ref 11.5–15.5)
WBC Count: 13.9 10*3/uL — ABNORMAL HIGH (ref 4.0–10.5)
nRBC: 0 % (ref 0.0–0.2)

## 2019-10-04 LAB — CMP (CANCER CENTER ONLY)
ALT: 34 U/L (ref 0–44)
AST: 32 U/L (ref 15–41)
Albumin: 3.8 g/dL (ref 3.5–5.0)
Alkaline Phosphatase: 89 U/L (ref 38–126)
Anion gap: 11 (ref 5–15)
BUN: 15 mg/dL (ref 8–23)
CO2: 31 mmol/L (ref 22–32)
Calcium: 9.3 mg/dL (ref 8.9–10.3)
Chloride: 93 mmol/L — ABNORMAL LOW (ref 98–111)
Creatinine: 1.31 mg/dL — ABNORMAL HIGH (ref 0.44–1.00)
GFR, Est AFR Am: 47 mL/min — ABNORMAL LOW (ref >60)
GFR, Estimated: 41 mL/min — ABNORMAL LOW (ref >60)
Glucose, Bld: 146 mg/dL — ABNORMAL HIGH (ref 70–99)
Potassium: 3.5 mmol/L (ref 3.5–5.1)
Sodium: 135 mmol/L (ref 135–145)
Total Bilirubin: 0.7 mg/dL (ref 0.3–1.2)
Total Protein: 7.4 g/dL (ref 6.5–8.1)

## 2019-10-11 ENCOUNTER — Other Ambulatory Visit: Payer: Self-pay

## 2019-10-11 ENCOUNTER — Inpatient Hospital Stay: Payer: Medicare Other | Attending: Oncology | Admitting: Oncology

## 2019-10-11 VITALS — BP 138/88 | HR 94 | Temp 97.8°F | Resp 19 | Ht 65.0 in | Wt 226.2 lb

## 2019-10-11 DIAGNOSIS — Z79899 Other long term (current) drug therapy: Secondary | ICD-10-CM | POA: Diagnosis not present

## 2019-10-11 DIAGNOSIS — Z7982 Long term (current) use of aspirin: Secondary | ICD-10-CM | POA: Insufficient documentation

## 2019-10-11 DIAGNOSIS — D72829 Elevated white blood cell count, unspecified: Secondary | ICD-10-CM | POA: Diagnosis present

## 2019-10-11 DIAGNOSIS — Z7984 Long term (current) use of oral hypoglycemic drugs: Secondary | ICD-10-CM | POA: Insufficient documentation

## 2019-10-11 NOTE — Progress Notes (Signed)
Hematology and Oncology Follow Up Visit  Keaisha Sublette Arts [id number] 06/15/1946 74 y.o. 10/11/2019 3:56 PM [name], PA-CWharton, Loma Sousa, PA-C   Principle Diagnosis: 74 year old woman with leukocytosis diagnosed 2016.  Etiology appears to be reactive without any clear-cut myeloproliferative disorder.     Current therapy: Active surveillance.  Interim History: Ms. Bordas returns today for a follow-up.  Since the last visit, she reports no major changes in her health.  She denies any recent hospitalizations or infections.  She any constitutional symptoms.  Her performance status and quality of life remains unchanged.     Medications: I have reviewed the patient's current medications.  Current Outpatient Medications  Medication Sig Dispense Refill  . allopurinol (ZYLOPRIM) 100 MG tablet Take 200 mg by mouth daily.    [name] aspirin EC 81 MG tablet Take 1 tablet (81 mg total) by mouth daily. 90 tablet 3  . Calcium Carbonate-Vitamin D (OSCAL 500/200 D-3 PO) Take 1 tablet by mouth daily.    . Cholecalciferol 1000 UNITS capsule Take 5,000 Units by mouth daily.     [name] esomeprazole (NEXIUM) 20 MG capsule Take 20 mg by mouth daily at 12 noon.    . furosemide (LASIX) 20 MG tablet Take 20 mg by mouth daily as needed for fluid.    [name] glipiZIDE (GLUCOTROL XL) 5 MG 24 hr tablet Take 5 mg by mouth daily.  3  . losartan-hydrochlorothiazide (HYZAAR) 100-25 MG tablet Take half (1/2) tablet by mouth daily.  2  . LYRICA 75 MG capsule Take 75 mg by mouth 4 (four) times daily.  1  . metoprolol succinate (TOPROL-XL) 100 MG 24 hr tablet Take 100 mg by mouth daily. Take with or immediately following a meal.    . Multiple Vitamins-Minerals (CENTRUM SILVER ULTRA WOMENS PO) Take 1 tablet by mouth daily.    . potassium chloride (KLOR-CON) 20 MEQ packet Take 20 mEq by mouth daily.    . potassium chloride SA (K-DUR,KLOR-CON) 20 MEQ tablet Take 20 mEq by mouth daily as needed (take with lasix for fluid  retention).     . rosuvastatin (CRESTOR) 5 MG tablet Take 1 tablet (5 mg total) by mouth daily. Please make overdue appt with Dr. Tamala Julian before anymore refills. 3rd attempt 90 tablet 1   No current facility-administered medications for this visit.     Allergies: No Known Allergies    Physical Exam: Blood pressure 138/88, pulse 94, temperature 97.8 F (36.6 C), temperature source Oral, resp. rate 19, height _0  (1.651 m), weight 226 lb 3.2 oz (102.6 kg), SpO2 93 %.   ECOG:     General appearance: Comfortable appearing without any discomfort Head: Normocephalic without any trauma Oropharynx: Mucous membranes are moist and pink without any thrush or ulcers. Eyes: Pupils are equal and round reactive to light. Lymph nodes: No cervical, supraclavicular, inguinal or axillary lymphadenopathy.   Heart:regular rate and rhythm.  S1 and S2 without leg edema. Lung: Clear without any rhonchi or wheezes.  No dullness to percussion. Abdomin: Soft, nontender, nondistended with good bowel sounds.  No hepatosplenomegaly. Musculoskeletal: No joint deformity or effusion.  Full range of motion noted. Neurological: No deficits noted on motor, sensory and deep tendon reflex exam. Skin: No petechial rash or dryness.  Appeared moist.  Psychiatric: Mood and affect appeared appropriate.     Lab Results: Lab Results  Component Value Date   WBC 13.9 (H) 10/04/2019   HGB 12.8 10/04/2019   HCT 38.0 10/04/2019   MCV 90.9 10/04/2019  PLT 319 10/04/2019     Chemistry      Component Value Date/Time   NA 135 10/04/2019 1153   K 3.5 10/04/2019 1153   CL 93 (L) 10/04/2019 1153   CO2 31 10/04/2019 1153   BUN 15 10/04/2019 1153   CREATININE 1.31 (H) 10/04/2019 1153      Component Value Date/Time   CALCIUM 9.3 10/04/2019 1153   ALKPHOS 89 10/04/2019 1153   AST 32 10/04/2019 1153   ALT 34 10/04/2019 1153   BILITOT 0.7 10/04/2019 1153        Impression and Plan:  74 year old woman  with:  1.  Leukocytosis noted in 2016.  Her white cell count has fluctuated between 16 and 17,000.  Differential diagnosis includes reactive in nature and unlikely myeloproliferative disorder.  Laboratory data obtained on October 04, 2019 were reviewed and showed a white cell count of 13.9 normal hemoglobin and platelets.  Differential was within normal range.  Her CML work-up is pending at this time.  If her myeloproliferative disorder work-up is negative including CML, I do not recommend any additional intervention.   If work-up did reveal CML or other myeloproliferative disorder, bone marrow biopsy and possible treatment may be needed but this is considered less likely at this time.  From a management standpoint, I recommend continued active surveillance.  2.  Follow-up: Will be as needed unless her work-up reveals any hematological disorder.   30  minutes were dedicated to this visit. The time was spent on reviewing laboratory data, discussing treatment options, discussing differential diagnosis and answering questions regarding future plan.  Zola Button, MD 4/6/20213:56 PM

## 2019-10-12 LAB — BCR ABL1 FISH (GENPATH)

## 2019-11-02 ENCOUNTER — Other Ambulatory Visit: Payer: Self-pay | Admitting: Family Medicine

## 2019-11-02 DIAGNOSIS — M810 Age-related osteoporosis without current pathological fracture: Secondary | ICD-10-CM

## 2019-11-02 DIAGNOSIS — E559 Vitamin D deficiency, unspecified: Secondary | ICD-10-CM

## 2019-12-19 LAB — JAK2 (INCLUDING V617F AND EXON 12), MPL,& CALR-NEXT GEN SEQ

## 2020-01-11 ENCOUNTER — Other Ambulatory Visit: Payer: Self-pay | Admitting: Family Medicine

## 2020-01-11 DIAGNOSIS — Z1231 Encounter for screening mammogram for malignant neoplasm of breast: Secondary | ICD-10-CM

## 2020-01-25 ENCOUNTER — Ambulatory Visit
Admission: RE | Admit: 2020-01-25 | Discharge: 2020-01-25 | Disposition: A | Discharge status: Home / Self Care | Payer: Medicare Other | Source: Ambulatory Visit | Attending: Family Medicine | Admitting: Family Medicine

## 2020-01-25 ENCOUNTER — Other Ambulatory Visit: Payer: Self-pay

## 2020-01-25 DIAGNOSIS — Z1231 Encounter for screening mammogram for malignant neoplasm of breast: Secondary | ICD-10-CM

## 2020-01-27 ENCOUNTER — Other Ambulatory Visit: Payer: Self-pay | Admitting: Family Medicine

## 2020-01-27 DIAGNOSIS — R928 Other abnormal and inconclusive findings on diagnostic imaging of breast: Secondary | ICD-10-CM

## 2020-02-08 ENCOUNTER — Other Ambulatory Visit: Payer: Self-pay

## 2020-02-08 ENCOUNTER — Ambulatory Visit
Admission: RE | Admit: 2020-02-08 | Discharge: 2020-02-08 | Disposition: A | Discharge status: Home / Self Care | Payer: Medicare Other | Source: Ambulatory Visit | Attending: Family Medicine | Admitting: Family Medicine

## 2020-02-08 ENCOUNTER — Other Ambulatory Visit: Payer: Self-pay | Admitting: Family Medicine

## 2020-02-08 DIAGNOSIS — R928 Other abnormal and inconclusive findings on diagnostic imaging of breast: Secondary | ICD-10-CM

## 2020-02-08 DIAGNOSIS — N6489 Other specified disorders of breast: Secondary | ICD-10-CM

## 2020-02-20 ENCOUNTER — Ambulatory Visit
Admission: RE | Admit: 2020-02-20 | Discharge: 2020-02-20 | Disposition: A | Discharge status: Home / Self Care | Payer: Medicare Other | Source: Ambulatory Visit | Attending: Family Medicine | Admitting: Family Medicine

## 2020-02-20 ENCOUNTER — Other Ambulatory Visit: Payer: Self-pay

## 2020-02-20 DIAGNOSIS — N6489 Other specified disorders of breast: Secondary | ICD-10-CM

## 2020-05-18 ENCOUNTER — Other Ambulatory Visit: Payer: Self-pay | Admitting: Family Medicine

## 2020-07-27 DIAGNOSIS — I2510 Atherosclerotic heart disease of native coronary artery without angina pectoris: Secondary | ICD-10-CM | POA: Diagnosis not present

## 2020-07-27 DIAGNOSIS — D72828 Other elevated white blood cell count: Secondary | ICD-10-CM | POA: Diagnosis not present

## 2020-07-27 DIAGNOSIS — E1143 Type 2 diabetes mellitus with diabetic autonomic (poly)neuropathy: Secondary | ICD-10-CM | POA: Diagnosis not present

## 2020-07-27 DIAGNOSIS — N183 Chronic kidney disease, stage 3 unspecified: Secondary | ICD-10-CM | POA: Diagnosis not present

## 2020-07-27 DIAGNOSIS — E7800 Pure hypercholesterolemia, unspecified: Secondary | ICD-10-CM | POA: Diagnosis not present

## 2020-07-27 DIAGNOSIS — E559 Vitamin D deficiency, unspecified: Secondary | ICD-10-CM | POA: Diagnosis not present

## 2020-07-27 DIAGNOSIS — I10 Essential (primary) hypertension: Secondary | ICD-10-CM | POA: Diagnosis not present

## 2020-07-27 DIAGNOSIS — M48061 Spinal stenosis, lumbar region without neurogenic claudication: Secondary | ICD-10-CM | POA: Diagnosis not present

## 2020-07-27 DIAGNOSIS — K219 Gastro-esophageal reflux disease without esophagitis: Secondary | ICD-10-CM | POA: Diagnosis not present

## 2020-07-27 DIAGNOSIS — M810 Age-related osteoporosis without current pathological fracture: Secondary | ICD-10-CM | POA: Diagnosis not present

## 2020-11-28 DIAGNOSIS — E1142 Type 2 diabetes mellitus with diabetic polyneuropathy: Secondary | ICD-10-CM | POA: Diagnosis not present

## 2020-11-28 DIAGNOSIS — M5412 Radiculopathy, cervical region: Secondary | ICD-10-CM | POA: Diagnosis not present

## 2020-11-28 DIAGNOSIS — M48061 Spinal stenosis, lumbar region without neurogenic claudication: Secondary | ICD-10-CM | POA: Diagnosis not present

## 2020-12-06 DIAGNOSIS — I2510 Atherosclerotic heart disease of native coronary artery without angina pectoris: Secondary | ICD-10-CM | POA: Diagnosis not present

## 2020-12-06 DIAGNOSIS — N183 Chronic kidney disease, stage 3 unspecified: Secondary | ICD-10-CM | POA: Diagnosis not present

## 2020-12-06 DIAGNOSIS — E7800 Pure hypercholesterolemia, unspecified: Secondary | ICD-10-CM | POA: Diagnosis not present

## 2020-12-06 DIAGNOSIS — M48061 Spinal stenosis, lumbar region without neurogenic claudication: Secondary | ICD-10-CM | POA: Diagnosis not present

## 2020-12-06 DIAGNOSIS — K219 Gastro-esophageal reflux disease without esophagitis: Secondary | ICD-10-CM | POA: Diagnosis not present

## 2020-12-06 DIAGNOSIS — D72828 Other elevated white blood cell count: Secondary | ICD-10-CM | POA: Diagnosis not present

## 2020-12-06 DIAGNOSIS — M810 Age-related osteoporosis without current pathological fracture: Secondary | ICD-10-CM | POA: Diagnosis not present

## 2020-12-06 DIAGNOSIS — E559 Vitamin D deficiency, unspecified: Secondary | ICD-10-CM | POA: Diagnosis not present

## 2020-12-06 DIAGNOSIS — E1143 Type 2 diabetes mellitus with diabetic autonomic (poly)neuropathy: Secondary | ICD-10-CM | POA: Diagnosis not present

## 2020-12-06 DIAGNOSIS — I10 Essential (primary) hypertension: Secondary | ICD-10-CM | POA: Diagnosis not present

## 2020-12-11 ENCOUNTER — Other Ambulatory Visit: Payer: Self-pay | Admitting: Neurology

## 2020-12-11 DIAGNOSIS — M48061 Spinal stenosis, lumbar region without neurogenic claudication: Secondary | ICD-10-CM

## 2020-12-11 DIAGNOSIS — G959 Disease of spinal cord, unspecified: Secondary | ICD-10-CM

## 2020-12-27 DIAGNOSIS — M47816 Spondylosis without myelopathy or radiculopathy, lumbar region: Secondary | ICD-10-CM | POA: Diagnosis not present

## 2020-12-27 DIAGNOSIS — G959 Disease of spinal cord, unspecified: Secondary | ICD-10-CM | POA: Diagnosis not present

## 2020-12-27 DIAGNOSIS — Z8669 Personal history of other diseases of the nervous system and sense organs: Secondary | ICD-10-CM | POA: Diagnosis not present

## 2020-12-27 DIAGNOSIS — M4807 Spinal stenosis, lumbosacral region: Secondary | ICD-10-CM | POA: Diagnosis not present

## 2020-12-27 DIAGNOSIS — M48061 Spinal stenosis, lumbar region without neurogenic claudication: Secondary | ICD-10-CM | POA: Diagnosis not present

## 2020-12-27 DIAGNOSIS — M4856XA Collapsed vertebra, not elsewhere classified, lumbar region, initial encounter for fracture: Secondary | ICD-10-CM | POA: Diagnosis not present

## 2020-12-27 DIAGNOSIS — M4802 Spinal stenosis, cervical region: Secondary | ICD-10-CM | POA: Diagnosis not present

## 2020-12-27 DIAGNOSIS — M5021 Other cervical disc displacement,  high cervical region: Secondary | ICD-10-CM | POA: Diagnosis not present

## 2020-12-27 DIAGNOSIS — M47812 Spondylosis without myelopathy or radiculopathy, cervical region: Secondary | ICD-10-CM | POA: Diagnosis not present

## 2021-01-03 DIAGNOSIS — E559 Vitamin D deficiency, unspecified: Secondary | ICD-10-CM | POA: Diagnosis not present

## 2021-01-03 DIAGNOSIS — R5383 Other fatigue: Secondary | ICD-10-CM | POA: Diagnosis not present

## 2021-01-03 DIAGNOSIS — M810 Age-related osteoporosis without current pathological fracture: Secondary | ICD-10-CM | POA: Diagnosis not present

## 2021-02-28 DIAGNOSIS — M5412 Radiculopathy, cervical region: Secondary | ICD-10-CM | POA: Diagnosis not present

## 2021-02-28 DIAGNOSIS — M48061 Spinal stenosis, lumbar region without neurogenic claudication: Secondary | ICD-10-CM | POA: Diagnosis not present

## 2021-02-28 DIAGNOSIS — E1142 Type 2 diabetes mellitus with diabetic polyneuropathy: Secondary | ICD-10-CM | POA: Diagnosis not present

## 2021-04-03 DIAGNOSIS — M47812 Spondylosis without myelopathy or radiculopathy, cervical region: Secondary | ICD-10-CM | POA: Diagnosis not present

## 2021-04-03 DIAGNOSIS — M4802 Spinal stenosis, cervical region: Secondary | ICD-10-CM | POA: Diagnosis not present

## 2021-04-03 DIAGNOSIS — M5033 Other cervical disc degeneration, cervicothoracic region: Secondary | ICD-10-CM | POA: Diagnosis not present

## 2021-04-03 DIAGNOSIS — M47816 Spondylosis without myelopathy or radiculopathy, lumbar region: Secondary | ICD-10-CM | POA: Diagnosis not present

## 2021-04-03 DIAGNOSIS — M5136 Other intervertebral disc degeneration, lumbar region: Secondary | ICD-10-CM | POA: Diagnosis not present

## 2021-04-03 DIAGNOSIS — M2578 Osteophyte, vertebrae: Secondary | ICD-10-CM | POA: Diagnosis not present

## 2021-04-03 DIAGNOSIS — M4186 Other forms of scoliosis, lumbar region: Secondary | ICD-10-CM | POA: Diagnosis not present

## 2021-04-03 DIAGNOSIS — M545 Low back pain, unspecified: Secondary | ICD-10-CM | POA: Diagnosis not present

## 2021-04-04 DIAGNOSIS — G629 Polyneuropathy, unspecified: Secondary | ICD-10-CM | POA: Diagnosis not present

## 2021-04-04 DIAGNOSIS — Z23 Encounter for immunization: Secondary | ICD-10-CM | POA: Diagnosis not present

## 2021-04-04 DIAGNOSIS — M21372 Foot drop, left foot: Secondary | ICD-10-CM | POA: Diagnosis not present

## 2021-04-04 DIAGNOSIS — M47812 Spondylosis without myelopathy or radiculopathy, cervical region: Secondary | ICD-10-CM | POA: Diagnosis not present

## 2021-04-04 DIAGNOSIS — E1142 Type 2 diabetes mellitus with diabetic polyneuropathy: Secondary | ICD-10-CM | POA: Diagnosis not present

## 2021-04-04 DIAGNOSIS — M4802 Spinal stenosis, cervical region: Secondary | ICD-10-CM | POA: Diagnosis not present

## 2021-04-04 DIAGNOSIS — M5126 Other intervertebral disc displacement, lumbar region: Secondary | ICD-10-CM | POA: Diagnosis not present

## 2021-04-04 DIAGNOSIS — M47816 Spondylosis without myelopathy or radiculopathy, lumbar region: Secondary | ICD-10-CM | POA: Diagnosis not present

## 2021-04-04 DIAGNOSIS — M8000XD Age-related osteoporosis with current pathological fracture, unspecified site, subsequent encounter for fracture with routine healing: Secondary | ICD-10-CM | POA: Diagnosis not present

## 2021-04-04 DIAGNOSIS — M48061 Spinal stenosis, lumbar region without neurogenic claudication: Secondary | ICD-10-CM | POA: Diagnosis not present

## 2021-04-30 DIAGNOSIS — M5412 Radiculopathy, cervical region: Secondary | ICD-10-CM | POA: Diagnosis not present

## 2021-04-30 DIAGNOSIS — G609 Hereditary and idiopathic neuropathy, unspecified: Secondary | ICD-10-CM | POA: Diagnosis not present

## 2021-04-30 DIAGNOSIS — M5416 Radiculopathy, lumbar region: Secondary | ICD-10-CM | POA: Diagnosis not present

## 2021-05-08 DIAGNOSIS — M25372 Other instability, left ankle: Secondary | ICD-10-CM | POA: Diagnosis not present

## 2021-05-10 ENCOUNTER — Other Ambulatory Visit (HOSPITAL_COMMUNITY): Payer: Self-pay | Admitting: Orthopedic Surgery

## 2021-05-10 DIAGNOSIS — M25572 Pain in left ankle and joints of left foot: Secondary | ICD-10-CM | POA: Diagnosis not present

## 2021-05-10 DIAGNOSIS — S8262XA Displaced fracture of lateral malleolus of left fibula, initial encounter for closed fracture: Secondary | ICD-10-CM | POA: Diagnosis not present

## 2021-05-13 NOTE — Progress Notes (Signed)
Surgical Instructions    Your procedure is scheduled on Thursday, November 10th, 2022.   Report to [facility] Main Entrance "A" at 05:30 A.M., then check in with the Admitting office.  Call this number if you have problems the morning of surgery:  [phone]   If you have any questions prior to your surgery date call [phone]: Open Monday-Friday 8am-4pm    Remember:  Do not eat after midnight the night before your surgery  You may drink clear liquids until 04:30 the morning of your surgery.   Clear liquids allowed are: Water, Non-Citrus Juices (without pulp), Carbonated Beverages, Clear Tea, Black Coffee ONLY (NO MILK, CREAM OR POWDERED CREAMER of any kind), and Gatorade  Patient Instructions  The day of surgery (if you have diabetes): Drink ONE (1) 12 oz G2 given to you in your pre admission testing appointment by 04:30 the morning of surgery. Drink in one sitting. Do not sip.  This drink was given to you during your hospital  pre-op appointment visit.  Nothing else to drink after completing the  12 oz bottle of G2.         If you have questions, please contact your surgeon's office.     Take these medicines the morning of surgery with A SIP OF WATER:  allopurinol (ZYLOPRIM)  metoprolol succinate (TOPROL-XL)  rosuvastatin (CRESTOR)  If needed:   LYRICA tiZANidine (ZANAFLEX) traMADol (ULTRAM)  Follow your surgeon's instructions on when to stop Aspirin.  If no instructions were given by your surgeon then you will need to call the office to get those instructions.     As of today, STOP taking any Aspirin (unless otherwise instructed by your surgeon) Aleve, Naproxen, Ibuprofen, Motrin, Advil, Goody's, BC's, all herbal medications, fish oil, and all vitamins.   WHAT DO I DO ABOUT MY DIABETES MEDICATION?   Do not metFORMIN (GLUCOPHAGE)  the morning of surgery.   HOW TO MANAGE YOUR DIABETES BEFORE AND AFTER SURGERY  Why is it important to control my blood sugar  before and after surgery? Improving blood sugar levels before and after surgery helps healing and can limit problems. A way of improving blood sugar control is eating a healthy diet by:  Eating less sugar and carbohydrates  Increasing activity/exercise  Talking with your doctor about reaching your blood sugar goals High blood sugars (greater than 180 mg/dL) can raise your risk of infections and slow your recovery, so you will need to focus on controlling your diabetes during the weeks before surgery. Make sure that the doctor who takes care of your diabetes knows about your planned surgery including the date and location.  How do I manage my blood sugar before surgery? Check your blood sugar at least 4 times a day, starting 2 days before surgery, to make sure that the level is not too high or low.  Check your blood sugar the morning of your surgery when you wake up and every 2 hours until you get to the Short Stay unit.  If your blood sugar is less than 70 mg/dL, you will need to treat for low blood sugar: Do not take insulin. Treat a low blood sugar (less than 70 mg/dL) with  cup of clear juice (cranberry or apple), 4 glucose tablets, OR glucose gel. Recheck blood sugar in 15 minutes after treatment (to make sure it is greater than 70 mg/dL). If your blood sugar is not greater than 70 mg/dL on recheck, call [phone] for further instructions. Report your blood sugar  to the short stay nurse when you get to Short Stay.  If you are admitted to the hospital after surgery: Your blood sugar will be checked by the staff and you will probably be given insulin after surgery (instead of oral diabetes medicines) to make sure you have good blood sugar levels. The goal for blood sugar control after surgery is 80-180 mg/dL.     After your COVID test   You are not required to quarantine however you are required to wear a well-fitting mask when you are out and around people not in your household.   If your mask becomes wet or soiled, replace with a new one.  Wash your hands often with soap and water for 20 seconds or clean your hands with an alcohol-based hand sanitizer that contains at least 60% alcohol.  Do not share personal items.  Notify your provider: if you are in close contact with someone who has COVID  or if you develop a fever of 100.4 or greater, sneezing, cough, sore throat, shortness of breath or body aches.    The day of surgery:          Do not wear jewelry or makeup Do not wear lotions, powders, perfumes, or deodorant. Do not shave 48 hours prior to surgery.   Do not bring valuables to the hospital. DO Not wear nail polish, gel polish, artificial nails, or any other type of covering on natural nails including finger and toenails. If patients have artificial nails, gel coating, etc. that need to be removed by a nail salon, please have this removed prior to surgery or surgery may need to be canceled/delayed if the surgeon/ anesthesia feels like the patient is unable to be adequately monitored.              [facility] is not responsible for any belongings or valuables.  Do NOT Smoke (Tobacco/Vaping)  24 hours prior to your procedure  If you use a CPAP at night, you may bring your mask for your overnight stay.   Contacts, glasses, hearing aids, dentures or partials may not be worn into surgery, please bring cases for these belongings   For patients admitted to the hospital, discharge time will be determined by your treatment team.   Patients discharged the day of surgery will not be allowed to drive home, and someone needs to stay with them for 24 hours.  NO VISITORS WILL BE ALLOWED IN PRE-OP WHERE PATIENTS ARE PREPPED FOR SURGERY.  ONLY 1 SUPPORT PERSON MAY BE PRESENT IN THE WAITING ROOM WHILE YOU ARE IN SURGERY.  IF YOU ARE TO BE ADMITTED, ONCE YOU ARE IN YOUR ROOM YOU WILL BE ALLOWED TWO (2) VISITORS. 1 (ONE) VISITOR MAY STAY OVERNIGHT BUT MUST ARRIVE TO THE  ROOM BY 8pm.  Minor children may have two parents present. Special consideration for safety and communication needs will be reviewed on a case by case basis.  Special instructions:    Oral Hygiene is also important to reduce your risk of infection.  Remember - BRUSH YOUR TEETH THE MORNING OF SURGERY WITH YOUR REGULAR TOOTHPASTE   [facility]- Preparing For Surgery  Before surgery, you can play an important role. Because skin is not sterile, your skin needs to be as free of germs as possible. You can reduce the number of germs on your skin by washing with CHG (chlorahexidine gluconate) Soap before surgery.  CHG is an antiseptic cleaner which kills germs and bonds with the skin to continue  killing germs even after washing.     Please do not use if you have an allergy to CHG or antibacterial soaps. If your skin becomes reddened/irritated stop using the CHG.  Do not shave (including legs and underarms) for at least 48 hours prior to first CHG shower. It is OK to shave your face.  Please follow these instructions carefully.     Shower the NIGHT BEFORE SURGERY and the MORNING OF SURGERY with CHG Soap.   If you chose to wash your hair, wash your hair first as usual with your normal shampoo. After you shampoo, rinse your hair and body thoroughly to remove the shampoo.  Then [employer] and genitals (private parts) with your normal soap and rinse thoroughly to remove soap.  After that Use CHG Soap as you would any other liquid soap. You can apply CHG directly to the skin and wash gently with a scrungie or a clean washcloth.   Apply the CHG Soap to your body ONLY FROM THE NECK DOWN.  Do not use on open wounds or open sores. Avoid contact with your eyes, ears, mouth and genitals (private parts). Wash Face and genitals (private parts)  with your normal soap.   Wash thoroughly, paying special attention to the area where your surgery will be performed.  Thoroughly rinse your body with warm water from the  neck down.  DO NOT shower/wash with your normal soap after using and rinsing off the CHG Soap.  Pat yourself dry with a CLEAN TOWEL.  Wear CLEAN PAJAMAS to bed the night before surgery  Place CLEAN SHEETS on your bed the night before your surgery  DO NOT SLEEP WITH PETS.   Day of Surgery:  Take a shower with CHG soap. Wear Clean/Comfortable clothing the morning of surgery Do not apply any deodorants/lotions.   Remember to brush your teeth WITH YOUR REGULAR TOOTHPASTE.   Please read over the following fact sheets that you were given.

## 2021-05-14 ENCOUNTER — Encounter (HOSPITAL_COMMUNITY): Payer: Self-pay

## 2021-05-14 ENCOUNTER — Encounter (HOSPITAL_COMMUNITY)
Admission: RE | Admit: 2021-05-14 | Discharge: 2021-05-14 | Disposition: A | Discharge status: Home / Self Care | Payer: Medicare Other | Source: Ambulatory Visit | Attending: Orthopedic Surgery | Admitting: Orthopedic Surgery

## 2021-05-14 ENCOUNTER — Other Ambulatory Visit: Payer: Self-pay

## 2021-05-14 VITALS — BP 122/55 | HR 92 | Temp 98.6°F | Resp 18 | Ht 65.0 in | Wt 215.0 lb

## 2021-05-14 DIAGNOSIS — Z20822 Contact with and (suspected) exposure to covid-19: Secondary | ICD-10-CM | POA: Insufficient documentation

## 2021-05-14 DIAGNOSIS — Z01818 Encounter for other preprocedural examination: Secondary | ICD-10-CM

## 2021-05-14 DIAGNOSIS — E0865 Diabetes mellitus due to underlying condition with hyperglycemia: Secondary | ICD-10-CM | POA: Insufficient documentation

## 2021-05-14 HISTORY — DX: Depression, unspecified: F32.A

## 2021-05-14 HISTORY — DX: Anxiety disorder, unspecified: F41.9

## 2021-05-14 LAB — BASIC METABOLIC PANEL
Anion gap: 12 (ref 5–15)
BUN: 16 mg/dL (ref 8–23)
CO2: 29 mmol/L (ref 22–32)
Calcium: 9.5 mg/dL (ref 8.9–10.3)
Chloride: 93 mmol/L — ABNORMAL LOW (ref 98–111)
Creatinine, Ser: 1.11 mg/dL — ABNORMAL HIGH (ref 0.44–1.00)
GFR, Estimated: 52 mL/min — ABNORMAL LOW (ref >60)
Glucose, Bld: 110 mg/dL — ABNORMAL HIGH (ref 70–99)
Potassium: 3 mmol/L — ABNORMAL LOW (ref 3.5–5.1)
Sodium: 134 mmol/L — ABNORMAL LOW (ref 135–145)

## 2021-05-14 LAB — SURGICAL PCR SCREEN
MRSA, PCR: NEGATIVE
Staphylococcus aureus: NEGATIVE

## 2021-05-14 LAB — HEMOGLOBIN A1C
Hgb A1c MFr Bld: 6.5 % — ABNORMAL HIGH (ref 4.8–5.6)
Mean Plasma Glucose: 139.85 mg/dL

## 2021-05-14 LAB — CBC
HCT: 40.5 % (ref 36.0–46.0)
Hemoglobin: 13.4 g/dL (ref 12.0–15.0)
MCH: 29.6 pg (ref 26.0–34.0)
MCHC: 33.1 g/dL (ref 30.0–36.0)
MCV: 89.6 fL (ref 80.0–100.0)
Platelets: 392 10*3/uL (ref 150–400)
RBC: 4.52 MIL/uL (ref 3.87–5.11)
RDW: 12.8 % (ref 11.5–15.5)
WBC: 15.8 10*3/uL — ABNORMAL HIGH (ref 4.0–10.5)
nRBC: 0 % (ref 0.0–0.2)

## 2021-05-14 LAB — GLUCOSE, CAPILLARY: Glucose-Capillary: 112 mg/dL — ABNORMAL HIGH (ref 70–99)

## 2021-05-14 NOTE — Progress Notes (Signed)
Abnormal labs in PAT: K 3.0; WBC 15.8. Dr. Doran Durand office was called and a message was left to [facility], the surgical scheduler to call back PAT. Also, a message was send to Dr. Doran Durand via staff message.

## 2021-05-14 NOTE — Progress Notes (Addendum)
PCP - [name], PA-C Cardiologist - Daneen Schick, MD  PPM/ICD - denies Device Orders - n/a Rep Notified - n/a  Chest x-ray - n/a EKG - 05/14/2021 Stress Test - 02/24/2018 ECHO - denies Cardiac Cath - 08/2004  Sleep Study - denies CPAP - n/a  Fasting Blood Sugar - 110 Checks Blood Sugar - patient is checking CBG very rare CBG today - 112 A1C - done in PAT on 05/14/2021  Blood Thinner Instructions: n/a  Aspirin Instructions: Aspirin - last dose - patient was instructed to ask MD if she need to hold Aspirin before surgery. Patient verbalized understanding.  Patient was instructed: As of today, STOP taking any Aspirin (unless otherwise instructed by your surgeon) Aleve, Naproxen, Ibuprofen, Motrin, Advil, Goody's, BC's, all herbal medications, fish oil, and all vitamins.  ERAS Protcol - yes PRE-SURGERY  G2- yes  COVID TEST- yes, done in PAT on 05/14/2021   Anesthesia review: yes - abnormal lab in PAT (Dr. Doran Durand was notified); patient had a stress test and a cardiac cath in the past but she denied any cardiac history/symptoms. Patient verbalized that her tests were negative.  Patient denies shortness of breath, fever, cough and chest pain at PAT appointment   All instructions explained to the patient, with a verbal understanding of the material. Patient agrees to go over the instructions while at home for a better understanding. Patient also instructed to self quarantine after being tested for COVID-19. The opportunity to ask questions was provided.

## 2021-05-15 LAB — SARS CORONAVIRUS 2 (TAT 6-24 HRS): SARS Coronavirus 2: NEGATIVE

## 2021-05-15 NOTE — Anesthesia Preprocedure Evaluation (Addendum)
Anesthesia Evaluation  Patient identified by MRN, date of birth, ID band Patient awake    Reviewed: Allergy & Precautions, NPO status , Patient's Chart, lab work & pertinent test results, reviewed documented beta blocker date and time   Airway Mallampati: III  TM Distance: >3 FB Neck ROM: Full    Dental no notable dental hx. (+) Teeth Intact, Dental Advisory Given   Pulmonary neg pulmonary ROS, former smoker,    Pulmonary exam normal breath sounds clear to auscultation       Cardiovascular hypertension, Pt. on medications and Pt. on home beta blockers + CAD  Normal cardiovascular exam Rhythm:Regular Rate:Normal  Stress Test 2019 normal   Neuro/Psych  Headaches, PSYCHIATRIC DISORDERS Anxiety Depression    GI/Hepatic Neg liver ROS, GERD  ,  Endo/Other  diabetes, Type 2, Oral Hypoglycemic Agents  Renal/GU Renal diseaseLab Results      Component                Value               Date                      CREATININE               1.11 (H)            05/14/2021                BUN                      16                  05/14/2021                NA                       134 (L)             05/14/2021                K                        3.0 (L)             05/14/2021                CL                       93 (L)              05/14/2021                CO2                      29                  05/14/2021             negative genitourinary   Musculoskeletal  (+) Arthritis ,   Abdominal   Peds  Hematology negative hematology ROS (+)   Anesthesia Other Findings   Reproductive/Obstetrics                            Anesthesia Physical Anesthesia Plan  ASA: 3  Anesthesia Plan: General and Regional   Post-op Pain Management:  Regional for Post-op pain  Induction: Intravenous  PONV Risk Score and Plan: 3 and Ondansetron, Dexamethasone and Midazolam  Airway Management Planned:  LMA  Additional Equipment:   Intra-op Plan:   Post-operative Plan: Extubation in OR  Informed Consent: I have reviewed the patients History and Physical, chart, labs and discussed the procedure including the risks, benefits and alternatives for the proposed anesthesia with the patient or authorized representative who has indicated his/her understanding and acceptance.     Dental advisory given  Plan Discussed with: CRNA  Anesthesia Plan Comments:         Anesthesia Quick Evaluation

## 2021-05-16 ENCOUNTER — Inpatient Hospital Stay (HOSPITAL_COMMUNITY): Payer: Medicare Other | Admitting: Physician Assistant

## 2021-05-16 ENCOUNTER — Inpatient Hospital Stay (HOSPITAL_COMMUNITY): Payer: Medicare Other | Admitting: Anesthesiology

## 2021-05-16 ENCOUNTER — Other Ambulatory Visit: Payer: Self-pay

## 2021-05-16 ENCOUNTER — Encounter (HOSPITAL_COMMUNITY): Payer: Self-pay | Admitting: Orthopedic Surgery

## 2021-05-16 ENCOUNTER — Inpatient Hospital Stay (HOSPITAL_COMMUNITY)
Admission: RE | Admit: 2021-05-16 | Discharge: 2021-05-20 | DRG: 493 | Disposition: A | Discharge status: Skilled Nursing Facility | Payer: Medicare Other | Attending: Orthopedic Surgery | Admitting: Orthopedic Surgery

## 2021-05-16 ENCOUNTER — Encounter (HOSPITAL_COMMUNITY)
Admission: RE | Disposition: A | Discharge status: Skilled Nursing Facility | Payer: Self-pay | Source: Home / Self Care | Attending: Orthopedic Surgery

## 2021-05-16 DIAGNOSIS — R269 Unspecified abnormalities of gait and mobility: Secondary | ICD-10-CM | POA: Diagnosis present

## 2021-05-16 DIAGNOSIS — S8262XA Displaced fracture of lateral malleolus of left fibula, initial encounter for closed fracture: Secondary | ICD-10-CM | POA: Diagnosis not present

## 2021-05-16 DIAGNOSIS — Z7982 Long term (current) use of aspirin: Secondary | ICD-10-CM | POA: Diagnosis not present

## 2021-05-16 DIAGNOSIS — R262 Difficulty in walking, not elsewhere classified: Secondary | ICD-10-CM | POA: Diagnosis not present

## 2021-05-16 DIAGNOSIS — E119 Type 2 diabetes mellitus without complications: Secondary | ICD-10-CM

## 2021-05-16 DIAGNOSIS — Z7984 Long term (current) use of oral hypoglycemic drugs: Secondary | ICD-10-CM

## 2021-05-16 DIAGNOSIS — S8292XS Unspecified fracture of left lower leg, sequela: Secondary | ICD-10-CM | POA: Diagnosis not present

## 2021-05-16 DIAGNOSIS — Z9049 Acquired absence of other specified parts of digestive tract: Secondary | ICD-10-CM | POA: Diagnosis not present

## 2021-05-16 DIAGNOSIS — I2510 Atherosclerotic heart disease of native coronary artery without angina pectoris: Secondary | ICD-10-CM | POA: Diagnosis present

## 2021-05-16 DIAGNOSIS — Z20822 Contact with and (suspected) exposure to covid-19: Secondary | ICD-10-CM | POA: Diagnosis present

## 2021-05-16 DIAGNOSIS — Z79899 Other long term (current) drug therapy: Secondary | ICD-10-CM | POA: Diagnosis not present

## 2021-05-16 DIAGNOSIS — M2550 Pain in unspecified joint: Secondary | ICD-10-CM | POA: Diagnosis not present

## 2021-05-16 DIAGNOSIS — G8918 Other acute postprocedural pain: Secondary | ICD-10-CM | POA: Diagnosis not present

## 2021-05-16 DIAGNOSIS — F32A Depression, unspecified: Secondary | ICD-10-CM | POA: Diagnosis not present

## 2021-05-16 DIAGNOSIS — W19XXXA Unspecified fall, initial encounter: Secondary | ICD-10-CM | POA: Diagnosis present

## 2021-05-16 DIAGNOSIS — G8929 Other chronic pain: Secondary | ICD-10-CM | POA: Diagnosis present

## 2021-05-16 DIAGNOSIS — E1165 Type 2 diabetes mellitus with hyperglycemia: Secondary | ICD-10-CM | POA: Diagnosis not present

## 2021-05-16 DIAGNOSIS — M545 Low back pain, unspecified: Secondary | ICD-10-CM | POA: Diagnosis not present

## 2021-05-16 DIAGNOSIS — K219 Gastro-esophageal reflux disease without esophagitis: Secondary | ICD-10-CM | POA: Diagnosis present

## 2021-05-16 DIAGNOSIS — F419 Anxiety disorder, unspecified: Secondary | ICD-10-CM | POA: Diagnosis not present

## 2021-05-16 DIAGNOSIS — E1142 Type 2 diabetes mellitus with diabetic polyneuropathy: Secondary | ICD-10-CM | POA: Diagnosis present

## 2021-05-16 DIAGNOSIS — S93422A Sprain of deltoid ligament of left ankle, initial encounter: Secondary | ICD-10-CM | POA: Diagnosis present

## 2021-05-16 DIAGNOSIS — Z7401 Bed confinement status: Secondary | ICD-10-CM | POA: Diagnosis not present

## 2021-05-16 DIAGNOSIS — I700 Atherosclerosis of aorta: Secondary | ICD-10-CM | POA: Diagnosis present

## 2021-05-16 DIAGNOSIS — Z90710 Acquired absence of both cervix and uterus: Secondary | ICD-10-CM

## 2021-05-16 DIAGNOSIS — I10 Essential (primary) hypertension: Secondary | ICD-10-CM | POA: Diagnosis not present

## 2021-05-16 DIAGNOSIS — F418 Other specified anxiety disorders: Secondary | ICD-10-CM | POA: Diagnosis not present

## 2021-05-16 DIAGNOSIS — M1990 Unspecified osteoarthritis, unspecified site: Secondary | ICD-10-CM | POA: Diagnosis present

## 2021-05-16 DIAGNOSIS — S8263XA Displaced fracture of lateral malleolus of unspecified fibula, initial encounter for closed fracture: Secondary | ICD-10-CM | POA: Diagnosis present

## 2021-05-16 DIAGNOSIS — Z981 Arthrodesis status: Secondary | ICD-10-CM

## 2021-05-16 DIAGNOSIS — Q892 Congenital malformations of other endocrine glands: Secondary | ICD-10-CM

## 2021-05-16 DIAGNOSIS — E876 Hypokalemia: Secondary | ICD-10-CM | POA: Diagnosis not present

## 2021-05-16 DIAGNOSIS — E785 Hyperlipidemia, unspecified: Secondary | ICD-10-CM | POA: Diagnosis not present

## 2021-05-16 DIAGNOSIS — Z87891 Personal history of nicotine dependence: Secondary | ICD-10-CM | POA: Diagnosis not present

## 2021-05-16 DIAGNOSIS — N1831 Chronic kidney disease, stage 3a: Secondary | ICD-10-CM | POA: Diagnosis not present

## 2021-05-16 DIAGNOSIS — M109 Gout, unspecified: Secondary | ICD-10-CM | POA: Diagnosis present

## 2021-05-16 DIAGNOSIS — M6281 Muscle weakness (generalized): Secondary | ICD-10-CM | POA: Diagnosis not present

## 2021-05-16 DIAGNOSIS — G63 Polyneuropathy in diseases classified elsewhere: Secondary | ICD-10-CM | POA: Diagnosis not present

## 2021-05-16 DIAGNOSIS — E1122 Type 2 diabetes mellitus with diabetic chronic kidney disease: Secondary | ICD-10-CM | POA: Diagnosis not present

## 2021-05-16 DIAGNOSIS — G959 Disease of spinal cord, unspecified: Secondary | ICD-10-CM | POA: Diagnosis not present

## 2021-05-16 DIAGNOSIS — M25572 Pain in left ankle and joints of left foot: Secondary | ICD-10-CM | POA: Diagnosis not present

## 2021-05-16 DIAGNOSIS — S93432A Sprain of tibiofibular ligament of left ankle, initial encounter: Secondary | ICD-10-CM | POA: Diagnosis not present

## 2021-05-16 DIAGNOSIS — S8292XA Unspecified fracture of left lower leg, initial encounter for closed fracture: Secondary | ICD-10-CM | POA: Diagnosis not present

## 2021-05-16 HISTORY — PX: ORIF ANKLE FRACTURE: SHX5408

## 2021-05-16 LAB — GLUCOSE, CAPILLARY
Glucose-Capillary: 158 mg/dL — ABNORMAL HIGH (ref 70–99)
Glucose-Capillary: 153 mg/dL — ABNORMAL HIGH (ref 70–99)
Glucose-Capillary: 177 mg/dL — ABNORMAL HIGH (ref 70–99)
Glucose-Capillary: 256 mg/dL — ABNORMAL HIGH (ref 70–99)
Glucose-Capillary: 226 mg/dL — ABNORMAL HIGH (ref 70–99)

## 2021-05-16 SURGERY — OPEN REDUCTION INTERNAL FIXATION (ORIF) ANKLE FRACTURE
Anesthesia: Regional | Site: Ankle | Laterality: Left

## 2021-05-16 MED ORDER — PROPOFOL 10 MG/ML IV BOLUS
INTRAVENOUS | Status: DC | PRN
  Administered 2021-05-16: 200 mg via INTRAVENOUS

## 2021-05-16 MED ORDER — VANCOMYCIN HCL 500 MG IV SOLR
INTRAVENOUS | Status: DC | PRN
  Administered 2021-05-16: 500 mg via TOPICAL

## 2021-05-16 MED ORDER — FENTANYL CITRATE (PF) 100 MCG/2ML IJ SOLN: 25.0000 ug | INTRAMUSCULAR | Status: DC | PRN

## 2021-05-16 MED ORDER — ENOXAPARIN SODIUM 40 MG/0.4ML IJ SOSY
40.0000 mg | PREFILLED_SYRINGE | INTRAMUSCULAR | Status: DC
  Administered 2021-05-17 – 2021-05-20 (×4): 40 mg via SUBCUTANEOUS
  Filled 2021-05-16 (×4): qty 0.4

## 2021-05-16 MED ORDER — LIDOCAINE HCL (CARDIAC) PF 100 MG/5ML IV SOSY
PREFILLED_SYRINGE | INTRAVENOUS | Status: DC | PRN
  Administered 2021-05-16: 40 mg via INTRAVENOUS

## 2021-05-16 MED ORDER — PANTOPRAZOLE SODIUM 40 MG PO TBEC
40.0000 mg | DELAYED_RELEASE_TABLET | Freq: Every day | ORAL | Status: DC
  Administered 2021-05-16 – 2021-05-20 (×5): 40 mg via ORAL
  Filled 2021-05-16 (×5): qty 1

## 2021-05-16 MED ORDER — ORAL CARE MOUTH RINSE: 15.0000 mL | Freq: Once | OROMUCOSAL | Status: AC

## 2021-05-16 MED ORDER — LACTATED RINGERS IV SOLN: INTRAVENOUS | Status: DC | PRN

## 2021-05-16 MED ORDER — ONDANSETRON HCL 4 MG PO TABS: 4.0000 mg | ORAL_TABLET | Freq: Four times a day (QID) | ORAL | Status: DC | PRN

## 2021-05-16 MED ORDER — LOSARTAN POTASSIUM 50 MG PO TABS
50.0000 mg | ORAL_TABLET | Freq: Every day | ORAL | Status: DC
  Administered 2021-05-16 – 2021-05-20 (×5): 50 mg via ORAL
  Filled 2021-05-16 (×5): qty 1

## 2021-05-16 MED ORDER — FENTANYL CITRATE (PF) 250 MCG/5ML IJ SOLN
INTRAMUSCULAR | Status: DC | PRN
  Administered 2021-05-16: 100 ug via INTRAVENOUS

## 2021-05-16 MED ORDER — DOCUSATE SODIUM 100 MG PO CAPS
100.0000 mg | ORAL_CAPSULE | Freq: Two times a day (BID) | ORAL | Status: DC
  Administered 2021-05-16 – 2021-05-19 (×6): 100 mg via ORAL
  Filled 2021-05-16 (×9): qty 1

## 2021-05-16 MED ORDER — VANCOMYCIN HCL 500 MG IV SOLR
INTRAVENOUS | Status: AC
  Filled 2021-05-16: qty 10

## 2021-05-16 MED ORDER — HYDROCODONE-ACETAMINOPHEN 7.5-325 MG PO TABS: 1.0000 | ORAL_TABLET | ORAL | Status: DC | PRN

## 2021-05-16 MED ORDER — LACTATED RINGERS IV SOLN: INTRAVENOUS | Status: DC

## 2021-05-16 MED ORDER — METOPROLOL SUCCINATE ER 100 MG PO TB24
100.0000 mg | ORAL_TABLET | Freq: Every day | ORAL | Status: DC
  Administered 2021-05-17 – 2021-05-20 (×4): 100 mg via ORAL
  Filled 2021-05-16 (×4): qty 1

## 2021-05-16 MED ORDER — FUROSEMIDE 20 MG PO TABS: 20.0000 mg | ORAL_TABLET | Freq: Every day | ORAL | Status: DC | PRN

## 2021-05-16 MED ORDER — SODIUM CHLORIDE 0.9 % IV SOLN: INTRAVENOUS | Status: DC

## 2021-05-16 MED ORDER — MORPHINE SULFATE (PF) 2 MG/ML IV SOLN: 0.5000 mg | INTRAVENOUS | Status: DC | PRN

## 2021-05-16 MED ORDER — DIPHENHYDRAMINE HCL 12.5 MG/5ML PO ELIX: 12.5000 mg | ORAL_SOLUTION | ORAL | Status: DC | PRN

## 2021-05-16 MED ORDER — TIZANIDINE HCL 2 MG PO TABS
2.0000 mg | ORAL_TABLET | Freq: Three times a day (TID) | ORAL | Status: DC | PRN
  Administered 2021-05-18 – 2021-05-20 (×4): 2 mg via ORAL
  Filled 2021-05-16 (×4): qty 1

## 2021-05-16 MED ORDER — FENTANYL CITRATE (PF) 250 MCG/5ML IJ SOLN
INTRAMUSCULAR | Status: AC
  Filled 2021-05-16: qty 5

## 2021-05-16 MED ORDER — ALLOPURINOL 100 MG PO TABS
100.0000 mg | ORAL_TABLET | Freq: Two times a day (BID) | ORAL | Status: DC
  Administered 2021-05-16 – 2021-05-20 (×8): 100 mg via ORAL
  Filled 2021-05-16 (×8): qty 1

## 2021-05-16 MED ORDER — ACETAMINOPHEN 325 MG PO TABS
325.0000 mg | ORAL_TABLET | Freq: Four times a day (QID) | ORAL | Status: DC | PRN
  Administered 2021-05-18: 650 mg via ORAL
  Filled 2021-05-16: qty 2

## 2021-05-16 MED ORDER — HYDROCHLOROTHIAZIDE 12.5 MG PO TABS
12.5000 mg | ORAL_TABLET | Freq: Every day | ORAL | Status: DC
  Administered 2021-05-16 – 2021-05-17 (×2): 12.5 mg via ORAL
  Filled 2021-05-16 (×2): qty 1

## 2021-05-16 MED ORDER — INSULIN ASPART 100 UNIT/ML IJ SOLN
0.0000 [IU] | Freq: Three times a day (TID) | INTRAMUSCULAR | Status: DC
  Administered 2021-05-16: 11 [IU] via SUBCUTANEOUS
  Administered 2021-05-16: 4 [IU] via SUBCUTANEOUS
  Administered 2021-05-17 – 2021-05-18 (×4): 3 [IU] via SUBCUTANEOUS
  Administered 2021-05-19: 4 [IU] via SUBCUTANEOUS
  Administered 2021-05-19 – 2021-05-20 (×2): 3 [IU] via SUBCUTANEOUS
  Administered 2021-05-20: 4 [IU] via SUBCUTANEOUS

## 2021-05-16 MED ORDER — LOSARTAN POTASSIUM-HCTZ 100-25 MG PO TABS: 0.5000 | ORAL_TABLET | Freq: Every day | ORAL | Status: DC

## 2021-05-16 MED ORDER — ROPIVACAINE HCL 5 MG/ML IJ SOLN
INTRAMUSCULAR | Status: DC | PRN
  Administered 2021-05-16: 30 mL via PERINEURAL
  Administered 2021-05-16: 20 mL via PERINEURAL

## 2021-05-16 MED ORDER — 0.9 % SODIUM CHLORIDE (POUR BTL) OPTIME
TOPICAL | Status: DC | PRN
  Administered 2021-05-16: 1000 mL

## 2021-05-16 MED ORDER — CHLORHEXIDINE GLUCONATE 0.12 % MT SOLN
15.0000 mL | Freq: Once | OROMUCOSAL | Status: AC
  Administered 2021-05-16: 15 mL via OROMUCOSAL
  Filled 2021-05-16: qty 15

## 2021-05-16 MED ORDER — HYDROCODONE-ACETAMINOPHEN 5-325 MG PO TABS
1.0000 | ORAL_TABLET | ORAL | Status: DC | PRN
  Administered 2021-05-16 – 2021-05-20 (×12): 2 via ORAL
  Filled 2021-05-16: qty 2
  Filled 2021-05-16: qty 1
  Filled 2021-05-16: qty 2
  Filled 2021-05-16: qty 1
  Filled 2021-05-16 (×4): qty 2
  Filled 2021-05-16: qty 1
  Filled 2021-05-16 (×2): qty 2
  Filled 2021-05-16: qty 1
  Filled 2021-05-16 (×3): qty 2

## 2021-05-16 MED ORDER — CEFAZOLIN SODIUM-DEXTROSE 2-4 GM/100ML-% IV SOLN
2.0000 g | INTRAVENOUS | Status: DC
  Filled 2021-05-16: qty 100

## 2021-05-16 MED ORDER — ACETAMINOPHEN 500 MG PO TABS
1000.0000 mg | ORAL_TABLET | Freq: Once | ORAL | Status: AC
  Administered 2021-05-16: 1000 mg via ORAL
  Filled 2021-05-16: qty 2

## 2021-05-16 MED ORDER — POTASSIUM CHLORIDE CRYS ER 20 MEQ PO TBCR: 20.0000 meq | EXTENDED_RELEASE_TABLET | Freq: Every day | ORAL | Status: DC | PRN

## 2021-05-16 MED ORDER — ROSUVASTATIN CALCIUM 5 MG PO TABS
5.0000 mg | ORAL_TABLET | Freq: Every day | ORAL | Status: DC
  Administered 2021-05-17 – 2021-05-20 (×4): 5 mg via ORAL
  Filled 2021-05-16 (×4): qty 1

## 2021-05-16 MED ORDER — CEFAZOLIN SODIUM-DEXTROSE 2-3 GM-%(50ML) IV SOLR
INTRAVENOUS | Status: DC | PRN
  Administered 2021-05-16: 2 g via INTRAVENOUS

## 2021-05-16 MED ORDER — PHENYLEPHRINE HCL-NACL 20-0.9 MG/250ML-% IV SOLN
INTRAVENOUS | Status: DC | PRN
  Administered 2021-05-16: 30 ug/min via INTRAVENOUS

## 2021-05-16 MED ORDER — DEXAMETHASONE SODIUM PHOSPHATE 10 MG/ML IJ SOLN
INTRAMUSCULAR | Status: DC | PRN
  Administered 2021-05-16 (×2): 5 mg

## 2021-05-16 MED ORDER — PROPOFOL 10 MG/ML IV BOLUS
INTRAVENOUS | Status: AC
  Filled 2021-05-16: qty 20

## 2021-05-16 MED ORDER — ONDANSETRON HCL 4 MG/2ML IJ SOLN: 4.0000 mg | Freq: Four times a day (QID) | INTRAMUSCULAR | Status: DC | PRN

## 2021-05-16 MED ORDER — PREGABALIN 75 MG PO CAPS
75.0000 mg | ORAL_CAPSULE | Freq: Four times a day (QID) | ORAL | Status: DC | PRN
  Administered 2021-05-16 – 2021-05-19 (×8): 75 mg via ORAL
  Filled 2021-05-16 (×8): qty 1

## 2021-05-16 MED ORDER — ONDANSETRON HCL 4 MG/2ML IJ SOLN
INTRAMUSCULAR | Status: DC | PRN
  Administered 2021-05-16: 4 mg via INTRAVENOUS

## 2021-05-16 MED ORDER — SENNA 8.6 MG PO TABS
1.0000 | ORAL_TABLET | Freq: Two times a day (BID) | ORAL | Status: DC
  Administered 2021-05-16 – 2021-05-19 (×6): 8.6 mg via ORAL
  Filled 2021-05-16 (×9): qty 1

## 2021-05-16 SURGICAL SUPPLY — 66 items
ALCOHOL 70% 16 OZ (MISCELLANEOUS) ×2 IMPLANT
ANCH SUT 1 SHRT SM RGD INSRTR (Anchor) ×1 IMPLANT
ANCHOR SUT 1.45 SZ 1 SHORT (Anchor) ×2 IMPLANT
BAG COUNTER SPONGE SURGICOUNT (BAG) ×2 IMPLANT
BAG SPNG CNTER NS LX DISP (BAG) ×1
BANDAGE ESMARK 6X9 LF (GAUZE/BANDAGES/DRESSINGS) ×1 IMPLANT
BIT DRILL 2.5X2.75 QC CALB (BIT) ×2 IMPLANT
BIT DRILL 3.5X5.5 QC CALB (BIT) ×2 IMPLANT
BLADE SURG 15 STRL LF DISP TIS (BLADE) ×1 IMPLANT
BLADE SURG 15 STRL SS (BLADE) ×2
BNDG CMPR 9X6 STRL LF SNTH (GAUZE/BANDAGES/DRESSINGS) ×1
BNDG COHESIVE 4X5 TAN ST LF (GAUZE/BANDAGES/DRESSINGS) ×2 IMPLANT
BNDG COHESIVE 4X5 TAN STRL (GAUZE/BANDAGES/DRESSINGS) ×2 IMPLANT
BNDG COHESIVE 6X5 TAN ST LF (GAUZE/BANDAGES/DRESSINGS) ×2 IMPLANT
BNDG COHESIVE 6X5 TAN STRL LF (GAUZE/BANDAGES/DRESSINGS) ×2 IMPLANT
BNDG ESMARK 6X9 LF (GAUZE/BANDAGES/DRESSINGS) ×2
CANISTER SUCT 3000ML PPV (MISCELLANEOUS) ×2 IMPLANT
CHLORAPREP W/TINT 26 (MISCELLANEOUS) ×4 IMPLANT
COVER SURGICAL LIGHT HANDLE (MISCELLANEOUS) ×2 IMPLANT
CUFF TOURN SGL QUICK 42 (TOURNIQUET CUFF) IMPLANT
DRAPE OEC MINIVIEW 54X84 (DRAPES) ×2 IMPLANT
DRAPE U-SHAPE 47X51 STRL (DRAPES) ×2 IMPLANT
DRSG MEPITEL 4X7.2 (GAUZE/BANDAGES/DRESSINGS) ×2 IMPLANT
DRSG PAD ABDOMINAL 8X10 ST (GAUZE/BANDAGES/DRESSINGS) ×4 IMPLANT
ELECT REM PT RETURN 9FT ADLT (ELECTROSURGICAL) ×2
ELECTRODE REM PT RTRN 9FT ADLT (ELECTROSURGICAL) ×1 IMPLANT
GAUZE SPONGE 4X4 12PLY STRL (GAUZE/BANDAGES/DRESSINGS) ×2 IMPLANT
GLOVE SRG 8 PF TXTR STRL LF DI (GLOVE) ×1 IMPLANT
GLOVE SURG ENC MOIS LTX SZ8 (GLOVE) ×2 IMPLANT
GLOVE SURG LTX SZ8 (GLOVE) ×2 IMPLANT
GLOVE SURG UNDER POLY LF SZ8 (GLOVE) ×2
GOWN STRL REUS W/ TWL LRG LVL3 (GOWN DISPOSABLE) ×1 IMPLANT
GOWN STRL REUS W/ TWL XL LVL3 (GOWN DISPOSABLE) ×2 IMPLANT
GOWN STRL REUS W/TWL LRG LVL3 (GOWN DISPOSABLE) ×2
GOWN STRL REUS W/TWL XL LVL3 (GOWN DISPOSABLE) ×4
KIT BASIN OR (CUSTOM PROCEDURE TRAY) ×2 IMPLANT
KIT TURNOVER KIT B (KITS) ×2 IMPLANT
NS IRRIG 1000ML POUR BTL (IV SOLUTION) ×2 IMPLANT
PACK ORTHO EXTREMITY (CUSTOM PROCEDURE TRAY) ×2 IMPLANT
PAD ARMBOARD 7.5X6 YLW CONV (MISCELLANEOUS) ×4 IMPLANT
PAD CAST 3X4 CTTN HI CHSV (CAST SUPPLIES) ×1 IMPLANT
PAD CAST 4YDX4 CTTN HI CHSV (CAST SUPPLIES) ×1 IMPLANT
PADDING CAST COTTON 3X4 STRL (CAST SUPPLIES) ×2
PADDING CAST COTTON 4X4 STRL (CAST SUPPLIES) ×2
PADDING CAST COTTON 6X4 STRL (CAST SUPPLIES) ×2 IMPLANT
PLATE ACE 100DEG 7HOLE (Plate) ×2 IMPLANT
SCREW CORTICAL 3.5MM  16MM (Screw) ×6 IMPLANT
SCREW CORTICAL 3.5MM  30MM (Screw) ×2 IMPLANT
SCREW CORTICAL 3.5MM  60MM (Screw) ×2 IMPLANT
SCREW CORTICAL 3.5MM 16MM (Screw) ×3 IMPLANT
SCREW CORTICAL 3.5MM 22MM (Screw) ×2 IMPLANT
SCREW CORTICAL 3.5MM 30MM (Screw) ×1 IMPLANT
SCREW CORTICAL 3.5MM 50MM (Screw) ×4 IMPLANT
SCREW CORTICAL 3.5MM 60MM (Screw) ×1 IMPLANT
SPONGE T-LAP 18X18 ~~LOC~~+RFID (SPONGE) ×4 IMPLANT
SUCTION FRAZIER HANDLE 10FR (MISCELLANEOUS) ×2
SUCTION TUBE FRAZIER 10FR DISP (MISCELLANEOUS) ×1 IMPLANT
SUT ETHILON 3 0 PS 1 (SUTURE) IMPLANT
SUT MNCRL AB 3-0 PS2 18 (SUTURE) ×2 IMPLANT
SUT VIC AB 0 CT1 27 (SUTURE) ×2
SUT VIC AB 0 CT1 27XBRD ANBCTR (SUTURE) ×1 IMPLANT
SUT VIC AB 2-0 CT1 27 (SUTURE) ×4
SUT VIC AB 2-0 CT1 TAPERPNT 27 (SUTURE) ×2 IMPLANT
TOWEL GREEN STERILE (TOWEL DISPOSABLE) ×2 IMPLANT
TOWEL GREEN STERILE FF (TOWEL DISPOSABLE) ×2 IMPLANT
TUBE CONNECTING 12X1/4 (SUCTIONS) ×2 IMPLANT

## 2021-05-16 NOTE — Evaluation (Signed)
Physical Therapy Evaluation Patient Details Name: Pamela Snyder MRN: [id number] DOB: 1945-11-18 Today's Date: 05/16/2021  History of Present Illness  Pt is a 75 y.o. female with recent fall sustaining L ankle lateral malleolus fx and deltoid sprain, admitted for L ankle ORIF on 05/16/21. PMH includes anxiety, HTN, DM2, neuropathy, thyroid goiter, dementia.   Clinical Impression  Pt presents with an overall decrease in functional mobility secondary to above. 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 Today, pt requiring mod-maxA+2 for standing activity with RW, pt with difficulty maintaining LLE NWB precautions; pt pleasant and motivated despite fatigue post-op. Pt would benefit from SNF-level therapies to maximize functional mobility and independence prior to return home. Will follow acutely to address established goals.   Recommendations for follow up therapy are one component of a multi-disciplinary discharge planning process, led by the attending physician.  Recommendations may be updated based on patient status, additional functional criteria and insurance authorization.  Follow Up Recommendations Skilled nursing-short term rehab (<3 hours/day)    Assistance Recommended at Discharge Frequent or constant Supervision/Assistance  Functional Status Assessment Patient has had a recent decline in their functional status and demonstrates the ability to make significant improvements in function in a reasonable and predictable amount of time.  Equipment Recommendations   (defer)    Recommendations for Other Services       Precautions / Restrictions Precautions Precautions: Fall Restrictions Weight Bearing Restrictions: Yes LUE Weight Bearing: Non weight bearing      Mobility  Bed Mobility Overal bed mobility: Needs Assistance Bed Mobility: Supine to Sit     Supine to sit: Min assist;HOB elevated     General bed mobility comments:  MinA for HHA to elevate trunk, increased time and effort to scoot hips to EOB    Transfers Overall transfer level: Needs assistance Equipment used: Rolling walker (2 wheels) Transfers: Sit to/from Stand;Bed to chair/wheelchair/BSC Sit to Stand: Mod assist;+2 physical assistance;+2 safety/equipment;Max assist Stand pivot transfers: Mod assist;+2 physical assistance;+2 safety/equipment;Max assist         General transfer comment: Standing from EOB and recliner with MinA+2 for trunk elevation from EOB, modA+2 to maintain balance when transitioning UE support from bed to RW, maxA to maintain LLE NWB precautions; pt unable to take hop or scoot well on R foot, requiring modA+2 to pivot from bed to recliner with RW; max verbal cues and external assist to maintain LLE NWB with standing activity    Ambulation/Gait                  Stairs            Wheelchair Mobility    Modified Rankin (Stroke Patients Only)       Balance Overall balance assessment: Needs assistance   Sitting balance-Leahy Scale: Fair       Standing balance-Leahy Scale: Poor Standing balance comment: Reliant on BUE support, external assist, as well as external assist dedicated to LLE NWB                             Pertinent Vitals/Pain Pain Assessment: No/denies pain (L lower leg numb)    Home Living Family/patient expects to be discharged to:: Skilled nursing facility Living Arrangements: Spouse/significant other Available Help at Discharge: Family;Available PRN/intermittently Type of Home: House           Home Equipment: Rollator (4 wheels);Wheelchair - manual  Prior Function Prior Level of Function : Independent/Modified Independent             Mobility Comments: Pt typically mod independent with rollator, 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 Since fall with ankle fx, has been reliant on w/c with assist ADLs Comments: (P) ADLs,  IADLs, and stopped driving ~ 1 yr ago     Hand Dominance   Dominant Hand: (P) Right    Extremity/Trunk Assessment   Upper Extremity Assessment Upper Extremity Assessment: Generalized weakness    Lower Extremity Assessment Lower Extremity Assessment: Generalized weakness;LLE deficits/detail LLE Deficits / Details: s/p L ankle ORIF in cast; endorses L knee and lower leg numbness, unable to wiggle toes; hip and knee functionally at least 3/5 strength LLE: Unable to fully assess due to immobilization LLE Sensation: decreased light touch;history of peripheral neuropathy    Cervical / Trunk Assessment Cervical / Trunk Assessment: (P) Other exceptions Cervical / Trunk Exceptions: (P) increased body habitus  Communication   Communication: No difficulties  Cognition Arousal/Alertness: Awake/alert Behavior During Therapy: WFL for tasks assessed/performed Overall Cognitive Status: No family/caregiver present to determine baseline cognitive functioning                                 General Comments: Per chart, h/o dementia. Pt following commands and answering questions appropriately, very pleasant and participatory; verbose with speech requiring redirection in conversation and redirection to task; requires frequent cues to maintain LLE NWB precautions        General Comments      Exercises     Assessment/Plan    PT Assessment Patient needs continued PT services  PT Problem List Decreased strength;Decreased activity tolerance;Decreased balance;Decreased mobility;Decreased knowledge of use of DME;Decreased knowledge of precautions;Impaired sensation       PT Treatment Interventions DME instruction;Gait training;Stair training;Functional mobility training;Therapeutic activities;Therapeutic exercise;Balance training;Patient/family education;Wheelchair mobility training    PT Goals (Current goals can be found in the Care Plan section)  Acute Rehab PT Goals Patient  Stated Goal: Hopeful for post-acute rehab before return home PT Goal Formulation: With patient Time For Goal Achievement: 05/30/21 Potential to Achieve Goals: Good    Frequency Min 3X/week   Barriers to discharge Decreased caregiver support;Inaccessible home environment      Co-evaluation               AM-PAC PT "6 Clicks" Mobility  Outcome Measure Help needed turning from your back to your side while in a flat bed without using bedrails?: A Lot Help needed moving from lying on your back to sitting on the side of a flat bed without using bedrails?: A Lot Help needed moving to and from a bed to a chair (including a wheelchair)?: Total Help needed standing up from a chair using your arms (e.g., wheelchair or bedside chair)?: Total Help needed to walk in hospital room?: Total Help needed climbing 3-5 steps with a railing? : Total 6 Click Score: 8    End of Session Equipment Utilized During Treatment: Gait belt Activity Tolerance: Patient tolerated treatment well Patient left: in chair;with call bell/phone within reach;with chair alarm set Nurse Communication: Mobility status;Need for lift equipment PT Visit Diagnosis: Other abnormalities of gait and mobility (R26.89);Muscle weakness (generalized) (M62.81)    Time: 7371-0626 PT Time Calculation (min) (ACUTE ONLY): 26 min   Charges:   PT Evaluation $PT Eval Moderate Complexity: 1 Mod  Mabeline Caras, PT, DPT Acute Rehabilitation Services  Pager [phone] Office [facility] 05/16/2021, 4:50 PM

## 2021-05-16 NOTE — Anesthesia Postprocedure Evaluation (Signed)
Anesthesia Post Note  Patient: Pamela Snyder  Procedure(s) Performed: OPEN REDUCTION INTERNAL FIXATION (ORIF) Left ankle lateral malleolus, possible deltoid ligament repair (Left: Ankle)     Patient location during evaluation: PACU Anesthesia Type: Regional and General Level of consciousness: awake and alert Pain management: pain level controlled Vital Signs Assessment: post-procedure vital signs reviewed and stable Respiratory status: spontaneous breathing, nonlabored ventilation, respiratory function stable and patient connected to nasal cannula oxygen Cardiovascular status: blood pressure returned to baseline and stable Postop Assessment: no apparent nausea or vomiting Anesthetic complications: no   No notable events documented.  Last Vitals:  Vitals:   05/16/21 0915 05/16/21 0930  BP: (!) 139/92 (!) 125/57  Pulse:  76  Resp:  13  Temp: 36.7 C   SpO2:  90%    Last Pain:  Vitals:   05/16/21 0915  TempSrc:   PainSc: 0-No pain                 Esaias Cleavenger L Colten Desroches

## 2021-05-16 NOTE — Op Note (Signed)
05/16/2021  9:12 AM  PATIENT:  Pamela Snyder  75 y.o. female  PRE-OPERATIVE DIAGNOSIS: 1.  Left ankle lateral malleolus fracture      2.  Left ankle deltoid ligament sprain  POST-OPERATIVE DIAGNOSIS: Same 3.  Left ankle unstable syndesmosis  Procedure(s):  1.  Open treatment of left ankle lateral malleolus fracture with internal fixation 2.  Stress examination of the left ankle under fluoroscopy 3.  Open treatment of left ankle syndesmosis sprain with internal fixation 4.  Left deltoid ligament repair 5.  AP, mortise and lateral radiographs of the left ankle  SURGEON:  Wylene Simmer, MD  ASSISTANT: Mechele Claude, PA-C  ANESTHESIA:   General, regional  EBL:  minimal   TOURNIQUET:   Total Tourniquet Time Documented: Thigh (Left) - 46 minutes Total: Thigh (Left) - 46 minutes  COMPLICATIONS:  None apparent  DISPOSITION:  Extubated, awake and stable to recovery.  INDICATION FOR PROCEDURE: The patient is a 75 year old female with a past medical history significant for diabetes complicated by peripheral neuropathy.  She fell at home tending to her husband who has severe dementia.  She was seen in clinic last week.  Radiographs revealed a displaced and unstable left ankle fracture.  She presents now for operative treatment of this displaced and unstable left ankle injury.  The risks and benefits of the alternative treatment options have been discussed in detail.  The patient wishes to proceed with surgery and specifically understands risks of bleeding, infection, nerve damage, blood clots, need for additional surgery, amputation and death.   PROCEDURE IN DETAIL: After preoperative consent was obtained and the correct operative site was identified, the patient was brought the operating room and placed supine on the operating table.  General anesthesia was induced.  Preoperative antibiotics were administered.  A surgical timeout was taken.  The left lower extremity was prepped and draped  in standard sterile fashion with a tourniquet around the thigh.  The extremity was elevated and the tourniquet was inflated to 350 mmHg.  A longitudinal incision was made over the lateral malleolus.  Dissection was carried down through the subcutaneous tissues to the fracture site.  The fracture was cleaned of all hematoma and irrigated.  The fracture was reduced and held with a pointed tenaculum and a lobster claw.  A 3.5 mm fully threaded lag screw was inserted from posterior to anterior across the fracture site.  It was noted to have adequate purchase.  A 7 hole one third tubular plate from the [employer] small frag set was then contoured to fit the lateral malleolus.  It was secured proximally with a bicortical screw and distally with a unicortical screw.  AP and lateral radiographs confirmed appropriate reduction of the fracture.  Stress examination was then performed.  The ankle mortise was noted to still widen.  The decision was made to proceed with syndesmosis fixation.  The syndesmosis was reduced.  3 screws were placed across 4 cortices of the distal tibia and fibula.  The remaining 2 distal holes were filled with unicortical screws.  The stress examination was repeated.  Talar tilt was still evident.  Attention was turned to the medial side of the ankle where a longitudinal incision was made.  Dissection was carried down through the subcutaneous tissues.  The deltoid ligament was noted to be completely torn.  A #1 short rigid juggernaut anchor was placed in the medial malleolus after clearing all hematoma and decorticating the bone.  The sutures were passed down through the  fibers of the deep deltoid ligament distally.  The ankle was reduced and the sutures tied securing the deep deltoid ligament to the medial malleolus.  The superficial deltoid was then repaired with figure-of-eight sutures of 0 Vicryl.  Final AP, mortise and lateral radiographs confirmed appropriate position and length of all  hardware and appropriate reduction of the lateral malleolus fracture and the syndesmosis.  The wounds were irrigated copiously and sprinkled with vancomycin powder.  The periosteum was repaired over the fracture with 0 Vicryl.  Subcutaneous tissues were approximated with 2-0 Vicryl.  Skin incisions were closed with 3-0 nylon.  Sterile dressings were applied followed by a well-padded short leg splint.  The tourniquet was released after application of the dressings.  The patient was awakened from anesthesia and transported to the recovery room in stable condition.   FOLLOW UP PLAN: The patient will be admitted for physical therapy, Occupational Therapy and likely SNF placement.  Hospitalist consultation will be obtained for management of her medical comorbidities.  Lovenox for DVT prophylaxis while an inpatient.  Plan to discharge on Xarelto for two weeks post op.  RADIOGRAPHS:  AP, mortise and lateral xrays of the left ankle are obtained intra op.  These show interval reduction and fixation of the left ankle latmal fracture and syndesmosis.  Hardware is appropriately positioned and of the appropriate lengths.  No other acute injuries are noted.    Mechele Claude PA-C was present and scrubbed for the duration of the operative case. His assistance was essential in positioning the patient, prepping and draping, gaining and maintaining exposure, performing the operation, closing and dressing the wounds and applying the splint.

## 2021-05-16 NOTE — Consult Note (Signed)
Medical Consultation   Pamela Snyder  ZOX:[id number]  DOB: 1946-04-17  DOA: 05/16/2021  PCP: [name], PA-C   Outpatient Specialists: Erik Obey - neurology; Wilson - neurosurgery; Octavo.Porta - oncology    Requesting physician: Doran Durand - orthopedics  Reason for consultation: Broken ankle, husband with severe dementia and she will need placement.  Diabetic with neuropathy.  K+ mildly low.  Ankle is fixed, needs medical management.   History of Present Illness: Pamela Snyder is an 75 y.o. female with h/o DM; HTN; and HLD presenting for ORIF of L ankle.  She reports that a week ago she had a "klutz" moment and fell while trying to help her daughter.  It took a while to notice the bulge in her ankle.  She saw Dr. Doran Durand and came in today to have it repaired.  She has a nerve block and isn't having any pain.  She reports generally good glycemic control at home.  She currently lives with her husband, but he is actually hospitalized in [facility] because he had a fall at home; he has advanced dementia and is likely to be placed following his hospitalization.  She is planning to go to SNF Rehab (prefers [facility]) and then to return home independently, with both of her daughters nearby.   Review of Systems:  ROS As per HPI otherwise review of systems negative.    Past Medical History: Past Medical History:  Diagnosis Date   Anxiety    Arthritis    "right foot; spine; hands" (11/23/2014)   Charcot foot due to diabetes mellitus (HCC)    Depression    GERD (gastroesophageal reflux disease)    Gout    Hyperlipemia    Hypertension    Migraine    hx   Neuropathy    Numbness    Thyroid goiter 1986   Type II diabetes mellitus ([place])     Past Surgical History: Past Surgical History:  Procedure Laterality Date   ABDOMINAL HYSTERECTOMY  1999   w/BSO   ACHILLES TENDON LENGTHENING Right 11/23/2014   ACHILLES TENDON LENGTHENING Right 11/23/2014   Procedure:  RIGHT ACHILLES PERCUTANEOUS TENDON LENGTHENING;  Surgeon: Wylene Simmer, MD;  Location: [facility];  Service: Orthopedics;  Laterality: Right;   [place] Right 11/23/2014   mid foot   CARDIAC CATHETERIZATION  08/2004   FOOT ARTHRODESIS Right 11/23/2014   Procedure: RIGHT MID FOOT ARTHRODESIS;  Surgeon: Wylene Simmer, MD;  Location: [facility];  Service: Orthopedics;  Laterality: Right;   METATARSAL OSTEOTOMY Right 11/23/2014   Procedure: RIGHT MID FOOT OSTEOTOMY;  Surgeon: Wylene Simmer, MD;  Location: [facility];  Service: Orthopedics;  Laterality: Right;   OSTEOTOMY Right 11/23/2014   mid foot   PARTIAL THYMECTOMY  1986   ? side   TONSILLECTOMY  1954     Allergies:  No Known Allergies   Social History:  reports that she quit smoking about 18 years ago. Her smoking use included cigarettes. She has a 37.00 pack-year smoking history. She has never used smokeless tobacco. She reports that she does not drink alcohol and does not use drugs.   Family History: Family History  Problem Relation Age of Onset   Heart attack Mother        died at 74   Heart attack Father        died at 11   Alcoholism Father  Colon cancer Maternal Grandmother    Heart attack Maternal Grandmother    Diabetes Maternal Grandmother    Stroke Maternal Grandfather    Diabetes Paternal Grandmother    Diabetes Paternal Grandfather       Physical Exam: Vitals:   05/16/21 0629 05/16/21 0915 05/16/21 0930  BP: (!) 159/65 (!) 139/92 (!) 125/57  Pulse: 79  76  Resp: 18  13  Temp: (!) 97.5 F (36.4 C) 98 F (36.7 C)   TempSrc: Oral    SpO2: 94%  90%  Weight: 97.5 kg    Height: 5\' 5"  (1.651 m)      Constitutional: Alert and awake, oriented x3, not in any acute distress. Eyes:  EOMI, irises appear normal, anicteric sclera ENMT: external ears and nose appear normal, normal hearing, Lips appear normal, oropharynx mucosa, tongue appear normal  Neck: neck appears normal, no masses, normal ROM CVS:  S1-S2 clear, no murmur rubs or gallops Respiratory:  clear to auscultation bilaterally, no wheezing, rales or rhonchi. Respiratory effort normal. No accessory muscle use.  Abdomen: soft nontender, nondistended Musculoskeletal: : RLE SCD in place, LLE with splint and unable to move toes due to nerve block Neuro: Cranial nerves II-XII grossly intact Psych: judgement and insight appear normal, stable mood and affect, mental status Skin: no rashes or lesions or ulcers, no induration or nodules    Data reviewed:  I have personally reviewed the recent labs and imaging studies  Pertinent Labs:  From 11/8 K+ 3.0 Glucose 110 BUN 16/Creatinine 1.11/GFR 52 - stable WBC 15.8 A1c 6.5 COVID negative   Inpatient Medications:   Scheduled Meds: Continuous Infusions:  sodium chloride      ceFAZolin (ANCEF) IV     lactated ringers 10 mL/hr at 05/16/21 2197     Radiological Exams on Admission: No results found.  Impression/Recommendations Principal Problem:   Closed low lateral malleolus fracture Active Problems:   Hyperlipemia   Hypertension   Type II diabetes mellitus (HCC)  Ankle fracture -s/p ORIF -Management per ortho -Patient prefers [facility] for rehab  DM -hold Glucophage -Cover with resistant-scale SSI   HTN -Continue Hyzaar, Toprol XL  HLD -Continue Crestor   Thank you for this consultation.  Our [facility] hospitalist team will follow the patient with you.   Time Spent: 50 minutes  Karmen Bongo M.D. [facility] 05/16/2021, 10:01 AM

## 2021-05-16 NOTE — Plan of Care (Signed)
  Problem: Nutrition: Goal: Adequate nutrition will be maintained Outcome: Progressing   Problem: Pain Managment: Goal: General experience of comfort will improve Outcome: Progressing   

## 2021-05-16 NOTE — Evaluation (Signed)
Occupational Therapy Evaluation Patient Details Name: Pamela Snyder MRN: [id number] DOB: 1945/09/16 Today's Date: 05/16/2021   History of Present Illness 75 y.o. female with recent fall sustaining L ankle lateral malleolus fx and deltoid sprain, admitted for L ankle ORIF on 05/16/21. PMH includes anxiety, HTN, DM2, neuropathy, thyroid goiter, dementia.   Clinical Impression   PTA, pt was living with her husband and reports she was performing ADLs and IADLs as well as caring for her husband who as dementia; reports daughters assist with complex IADLs and driving.  Pt currently requiring Max A for LB ADLs and Mod-Max A +2 for functional transfers with RW. Pt presenting with decreased balance, strength, and adherence to NWB status. Pt would benefit from further acute OT to facilitate safe dc. Recommend dc to SNF for further OT to optimize safety, independence with ADLs, and return to PLOF.      Recommendations for follow up therapy are one component of a multi-disciplinary discharge planning process, led by the attending physician.  Recommendations may be updated based on patient status, additional functional criteria and insurance authorization.   Follow Up Recommendations  Skilled nursing-short term rehab (<3 hours/day) ([facility])    Assistance Recommended at Discharge Frequent or constant Supervision/Assistance  Functional Status Assessment  Patient has had a recent decline in their functional status and demonstrates the ability to make significant improvements in function in a reasonable and predictable amount of time.  Equipment Recommendations  Other (comment) (Defer to next venue)    Recommendations for Other Services PT consult     Precautions / Restrictions Precautions Precautions: Fall Restrictions Weight Bearing Restrictions: Yes LUE Weight Bearing: Non weight bearing      Mobility Bed Mobility Overal bed mobility: Needs Assistance Bed Mobility: Supine  to Sit     Supine to sit: Min assist;HOB elevated     General bed mobility comments: MinA for HHA to elevate trunk, increased time and effort to scoot hips to EOB    Transfers Overall transfer level: Needs assistance Equipment used: Rolling walker (2 wheels) Transfers: Sit to/from Stand;Bed to chair/wheelchair/BSC Sit to Stand: Mod assist;+2 physical assistance;+2 safety/equipment;Max assist Stand pivot transfers: Mod assist;+2 physical assistance;+2 safety/equipment;Max assist         General transfer comment: Standing from EOB and recliner with MinA+2 for trunk elevation from EOB, modA+2 to maintain balance when transitioning UE support from bed to RW, maxA to maintain LLE NWB precautions; pt unable to take hop or scoot well on R foot, requiring modA+2 to pivot from bed to recliner with RW; max verbal cues and external assist to maintain LLE NWB with standing activity      Balance Overall balance assessment: Needs assistance   Sitting balance-Leahy Scale: Fair       Standing balance-Leahy Scale: Poor Standing balance comment: Reliant on BUE support, external assist, as well as external assist dedicated to LLE NWB                           ADL either performed or assessed with clinical judgement   ADL Overall ADL's : Needs assistance/impaired Eating/Feeding: Set up;Sitting   Grooming: Set up;Sitting;Supervision/safety   Upper Body Bathing: Minimal assistance;Sitting   Lower Body Bathing: Maximal assistance;Sit to/from stand   Upper Body Dressing : Min guard;Sitting   Lower Body Dressing: Maximal assistance;Sit to/from stand   Toilet Transfer: Moderate assistance;Maximal assistance;+2 for physical assistance;+2 for safety/equipment;Stand-pivot;Rolling walker (2 wheels) (simulated to recliner)  Functional mobility during ADLs: Moderate assistance;Maximal assistance;+2 for physical assistance;+2 for safety/equipment;Rolling walker (2 wheels)  (stand pivot) General ADL Comments: Pt presenting with decreased balance, strength, and adherance to WB status     Vision         Perception     Praxis      Pertinent Vitals/Pain Pain Assessment: No/denies pain     Hand Dominance Right   Extremity/Trunk Assessment Upper Extremity Assessment Upper Extremity Assessment: Generalized weakness   Lower Extremity Assessment Lower Extremity Assessment: Defer to PT evaluation LLE Deficits / Details: s/p L ankle ORIF in cast; endorses L knee and lower leg numbness, unable to wiggle toes; hip and knee functionally at least 3/5 strength LLE: Unable to fully assess due to immobilization LLE Sensation: decreased light touch;history of peripheral neuropathy   Cervical / Trunk Assessment Cervical / Trunk Assessment: Other exceptions Cervical / Trunk Exceptions: Increased body habitus   Communication Communication Communication: No difficulties   Cognition Arousal/Alertness: Awake/alert Behavior During Therapy: WFL for tasks assessed/performed Overall Cognitive Status: No family/caregiver present to determine baseline cognitive functioning                                 General Comments: Per chart, h/o dementia. Pt following commands and answering questions appropriately, very pleasant and participatory; verbose with speech requiring redirection in conversation and redirection to task; requires frequent cues to maintain LLE NWB precautions     General Comments  Unable to maintain NWB status without physical A and hold of LLE    Exercises     Shoulder Instructions      Home Living Family/patient expects to be discharged to:: Skilled nursing facility Living Arrangements: Spouse/significant other Available Help at Discharge: Family;Available PRN/intermittently Type of Home: House                       Home Equipment: Rollator (4 wheels);Wheelchair - manual          Prior Functioning/Environment Prior  Level of Function : Independent/Modified Independent             Mobility Comments: Pt typically mod independent with rollator, 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 Since fall with ankle fx, has been reliant on w/c with assist ADLs Comments: Was performing ADLs and caring for husband until recent fall and L ankle fx        OT Problem List: Decreased strength;Decreased range of motion;Decreased activity tolerance;Impaired balance (sitting and/or standing);Decreased knowledge of use of DME or AE;Decreased knowledge of precautions      OT Treatment/Interventions: Self-care/ADL training;Therapeutic exercise;Energy conservation;DME and/or AE instruction;Therapeutic activities;Patient/family education    OT Goals(Current goals can be found in the care plan section) Acute Rehab OT Goals Patient Stated Goal: Go to rehab OT Goal Formulation: With patient Time For Goal Achievement: 05/30/21 Potential to Achieve Goals: Good  OT Frequency: Min 2X/week   Barriers to D/C:            Co-evaluation PT/OT/SLP Co-Evaluation/Treatment: Yes Reason for Co-Treatment: To address functional/ADL transfers;For patient/therapist safety   OT goals addressed during session: ADL's and self-care      AM-PAC OT "6 Clicks" Daily Activity     Outcome Measure Help from another person eating meals?: A Little Help from another person taking care of personal grooming?: A Little Help from another person toileting, which includes using toliet, bedpan, or urinal?: A Lot  Help from another person bathing (including washing, rinsing, drying)?: A Lot Help from another person to put on and taking off regular upper body clothing?: A Little Help from another person to put on and taking off regular lower body clothing?: A Lot 6 Click Score: 15   End of Session Equipment Utilized During Treatment: Gait belt;Rolling walker (2 wheels) Nurse Communication: Mobility status  Activity  Tolerance: Patient tolerated treatment well Patient left: in chair;with call bell/phone within reach;with chair alarm set  OT Visit Diagnosis: Unsteadiness on feet (R26.81);Other abnormalities of gait and mobility (R26.89);Muscle weakness (generalized) (M62.81)                Time: 3428-7681 OT Time Calculation (min): 29 min Charges:  OT General Charges $OT Visit: 1 Visit OT Evaluation $OT Eval Moderate Complexity: [place], OTR/L Acute Rehab Pager: [phone] Office: [place] 05/16/2021, 5:55 PM

## 2021-05-16 NOTE — Anesthesia Procedure Notes (Signed)
Anesthesia Regional Block: Popliteal block   Pre-Anesthetic Checklist: , timeout performed,  Correct Patient, Correct Site, Correct Laterality,  Correct Procedure, Correct Position, site marked,  Risks and benefits discussed,  Surgical consent,  Pre-op evaluation,  At surgeon's request and post-op pain management  Laterality: Left  Prep: Maximum Sterile Barrier Precautions used, chloraprep       Needles:  Injection technique: Single-shot  Needle Type: Echogenic Stimulator Needle     Needle Length: 9cm  Needle Gauge: 22     Additional Needles:   Procedures:,,,, ultrasound used (permanent image in chart),,    Narrative:  Start time: 05/16/2021 7:02 AM End time: 05/16/2021 7:07 AM Injection made incrementally with aspirations every 5 mL.  Performed by: Personally  Anesthesiologist: Freddrick March, MD  Additional Notes: Monitors applied. No increased pain on injection. No increased resistance to injection. Injection made in 5cc increments. Good needle visualization. Patient tolerated procedure well.

## 2021-05-16 NOTE — Anesthesia Procedure Notes (Signed)
Procedure Name: LMA Insertion Date/Time: 05/16/2021 7:57 AM Performed by: Claris Che, CRNA Pre-anesthesia Checklist: Patient identified, Emergency Drugs available, Suction available and Patient being monitored Patient Re-evaluated:Patient Re-evaluated prior to induction Oxygen Delivery Method: Circle System Utilized Preoxygenation: Pre-oxygenation with 100% oxygen Induction Type: IV induction LMA: LMA inserted LMA Size: 4.0 Number of attempts: 1 Airway Equipment and Method: Bite block Placement Confirmation: positive ETCO2 and breath sounds checked- equal and bilateral Tube secured with: Tape Dental Injury: Teeth and Oropharynx as per pre-operative assessment

## 2021-05-16 NOTE — Discharge Instructions (Signed)
Wylene Simmer, MD [facility]  Please read the following information regarding your care after surgery.  Medications  You only need a prescription for the narcotic pain medicine (ex. oxycodone, Percocet, Norco).  All of the other medicines listed below are available over the counter. X Aleve 2 pills twice a day for the first 3 days after surgery. X acetominophen (Tylenol) 650 mg every 4-6 hours as you need for minor to moderate pain X oxycodone as prescribed for severe pain  Narcotic pain medicine (ex. oxycodone, Percocet, Vicodin) will cause constipation.  To prevent this problem, take the following medicines while you are taking any pain medicine. X docusate sodium (Colace) 100 mg twice a day X senna (Senokot) 2 tablets twice a day  X To help prevent blood clots, take Xarelto as prescribed for two weeks after surgery.  You should also get up every hour while you are awake to move around.    Weight Bearing X Do not bear any weight on the operated leg or foot.  Cast / Splint / Dressing X Keep your splint, cast or dressing clean and dry.  Don't put anything (coat hanger, pencil, etc) down inside of it.  If it gets damp, use a hair dryer on the cool setting to dry it.  If it gets soaked, call the office to schedule an appointment for a cast change.   After your dressing, cast or splint is removed; you may shower, but do not soak or scrub the wound.  Allow the water to run over it, and then gently pat it dry.  Swelling It is normal for you to have swelling where you had surgery.  To reduce swelling and pain, keep your toes above your nose for at least 3 days after surgery.  It may be necessary to keep your foot or leg elevated for several weeks.  If it hurts, it should be elevated.  Follow Up Call my office at [phone] when you are discharged from the hospital or surgery center to schedule an appointment to be seen two weeks after surgery.  Call my office at [phone] if you develop  a fever >101.5 F, nausea, vomiting, bleeding from the surgical site or severe pain.

## 2021-05-16 NOTE — H&P (Signed)
Pamela Snyder is an 75 y.o. female.   Chief Complaint: left ankle pain HPI: 75 y/o female with left ankle fracture after a fall.  Her husband has severe dementia, and her caregiver network is unable to care for her at home.  She presents for ORIF L ankle and PT / OT / SNF placement post op.  Past Medical History:  Diagnosis Date   Anxiety    Arthritis    "right foot; spine; hands" (11/23/2014)   Charcot foot due to diabetes mellitus (HCC)    Depression    GERD (gastroesophageal reflux disease)    Gout    Hyperlipemia    Hypertension    Migraine    hx   Neuropathy    Numbness    Thyroid goiter 1986   Type II diabetes mellitus ([place])     Past Surgical History:  Procedure Laterality Date   ABDOMINAL HYSTERECTOMY  1999   w/BSO   ACHILLES TENDON LENGTHENING Right 11/23/2014   ACHILLES TENDON LENGTHENING Right 11/23/2014   Procedure: RIGHT ACHILLES PERCUTANEOUS TENDON LENGTHENING;  Surgeon: Wylene Simmer, MD;  Location: [facility];  Service: Orthopedics;  Laterality: Right;   [place] Right 11/23/2014   mid foot   CARDIAC CATHETERIZATION  08/2004   FOOT ARTHRODESIS Right 11/23/2014   Procedure: RIGHT MID FOOT ARTHRODESIS;  Surgeon: Wylene Simmer, MD;  Location: [facility];  Service: Orthopedics;  Laterality: Right;   METATARSAL OSTEOTOMY Right 11/23/2014   Procedure: RIGHT MID FOOT OSTEOTOMY;  Surgeon: Wylene Simmer, MD;  Location: [facility];  Service: Orthopedics;  Laterality: Right;   OSTEOTOMY Right 11/23/2014   mid foot   PARTIAL THYMECTOMY  1986   ? side   TONSILLECTOMY  1954    Family History  Problem Relation Age of Onset   Heart attack Mother        died at 33   Heart attack Father        died at 20   Alcoholism Father    Colon cancer Maternal Grandmother    Heart attack Maternal Grandmother    Diabetes Maternal Grandmother    Stroke Maternal Grandfather    Diabetes Paternal Grandmother    Diabetes Paternal Grandfather    Social History:  reports that she  quit smoking about 18 years ago. Her smoking use included cigarettes. She has a 37.00 pack-year smoking history. She has never used smokeless tobacco. She reports that she does not drink alcohol and does not use drugs.  Allergies: No Known Allergies  Medications Prior to Admission  Medication Sig Dispense Refill   allopurinol (ZYLOPRIM) 100 MG tablet Take 100 mg by mouth 2 (two) times daily.     aspirin EC 81 MG tablet Take 1 tablet (81 mg total) by mouth daily. 90 tablet 3   Calcium Carbonate-Vitamin D (OSCAL 500/200 D-3 PO) Take 1 tablet by mouth daily.     esomeprazole (NEXIUM) 20 MG capsule Take 20 mg by mouth daily at 12 noon.     furosemide (LASIX) 20 MG tablet Take 20 mg by mouth daily as needed for fluid.     losartan-hydrochlorothiazide (HYZAAR) 100-25 MG tablet Take 0.5 tablets by mouth daily.  2   LYRICA 75 MG capsule Take 75 mg by mouth 4 (four) times daily as needed (neuropathy).  1   metFORMIN (GLUCOPHAGE) 500 MG tablet Take 500 mg by mouth 2 (two) times daily.     metoprolol succinate (TOPROL-XL) 100 MG 24 hr tablet Take 100  mg by mouth daily. Take with or immediately following a meal.     Multiple Vitamins-Minerals (CENTRUM SILVER ULTRA WOMENS PO) Take 1 tablet by mouth daily.     potassium chloride SA (K-DUR,KLOR-CON) 20 MEQ tablet Take 20 mEq by mouth daily as needed (take with lasix for fluid retention).      rosuvastatin (CRESTOR) 5 MG tablet Take 1 tablet (5 mg total) by mouth daily. Please make overdue appt with Dr. Tamala Julian before anymore refills. 3rd attempt 90 tablet 1   tiZANidine (ZANAFLEX) 2 MG tablet Take 2 mg by mouth 3 (three) times daily as needed for muscle spasms.     traMADol (ULTRAM) 50 MG tablet Take 50 mg by mouth 3 (three) times daily as needed for pain.     VITAMIN D-VITAMIN K PO Take 1 tablet by mouth daily.     denosumab (PROLIA) 60 MG/ML SOSY injection Inject 60 mg into the skin every 6 (six) months.      Results for orders placed or performed during  the hospital encounter of 25-May-2021 (from the past 48 hour(s))  Glucose, capillary     Status: Abnormal   Collection Time: 2021-05-25  6:27 AM  Result Value Ref Range   Glucose-Capillary 158 (H) 70 - 99 mg/dL    Comment: Glucose reference range applies only to samples taken after fasting for at least 8 hours.   No results found.  Review of Systems  no recent f/c/nn/v  Blood pressure (!) 159/65, pulse 79, temperature (!) 97.5 F (36.4 C), temperature source Oral, resp. rate 18, height 5\' 5"  (1.651 m), weight 97.5 kg, SpO2 94 %. Physical Exam  60 wd female in nad.  Aa nd O x 4.  Normal mood and affect.  EOMI.  Resp unlalbored.  L ankle splinted.  Skin intact.  NVI.  Assessment/Plan L ankle lat mal fracture and deltoid sprain - to OR today for ORIF.  The risks and benefits of the alternative treatment options have been discussed in detail.  The patient wishes to proceed with surgery and specifically understands risks of bleeding, infection, nerve damage, blood clots, need for additional surgery, amputation and death.   Wylene Simmer, MD 05/25/21, 7:39 AM

## 2021-05-16 NOTE — Anesthesia Procedure Notes (Signed)
Anesthesia Regional Block: Adductor canal block   Pre-Anesthetic Checklist: , timeout performed,  Correct Patient, Correct Site, Correct Laterality,  Correct Procedure, Correct Position, site marked,  Risks and benefits discussed,  Surgical consent,  Pre-op evaluation,  At surgeon's request and post-op pain management  Laterality: Left  Prep: Maximum Sterile Barrier Precautions used, chloraprep       Needles:  Injection technique: Single-shot  Needle Type: Echogenic Stimulator Needle     Needle Length: 9cm  Needle Gauge: 22     Additional Needles:   Procedures:,,,, ultrasound used (permanent image in chart),,    Narrative:  Start time: 05/16/2021 7:07 AM End time: 05/16/2021 7:10 AM Injection made incrementally with aspirations every 5 mL.  Performed by: Personally  Anesthesiologist: Freddrick March, MD  Additional Notes: Monitors applied. No increased pain on injection. No increased resistance to injection. Injection made in 5cc increments. Good needle visualization. Patient tolerated procedure well.

## 2021-05-16 NOTE — Progress Notes (Signed)
Mobility Specialist Progress Note   05/16/21 1800  Mobility  Activity Transferred:  Bed to chair  Level of Assistance Maximum assist, patient does 25-49% (+2)  Assistive Device MaxiMove  LUE Weight Bearing NWB  Mobility Response Tolerated well  Mobility performed by Mobility specialist (NT)  $Mobility charge 1 Mobility   Received pt in chair needing to be transferred back to bed. Anxious pre-transfer but no complaints during and afterwards. Pt was left w/ NT and RN in the room    Holland Falling Mobility Specialist Phone Number [phone]

## 2021-05-16 NOTE — Transfer of Care (Signed)
Immediate Anesthesia Transfer of Care Note  Patient: Pamela Snyder  Procedure(s) Performed: OPEN REDUCTION INTERNAL FIXATION (ORIF) Left ankle lateral malleolus, possible deltoid ligament repair (Left: Ankle)  Patient Location: PACU  Anesthesia Type:GA combined with regional for post-op pain  Level of Consciousness: oriented, drowsy, patient cooperative and responds to stimulation  Airway & Oxygen Therapy: Patient Spontanous Breathing and Patient connected to nasal cannula oxygen  Post-op Assessment: Report given to RN, Post -op Vital signs reviewed and stable and Patient moving all extremities X 4  Post vital signs: Reviewed and stable  Last Vitals:  Vitals Value Taken Time  BP 139/92 05/16/21 0909  Temp    Pulse 79 05/16/21 0911  Resp 15 05/16/21 0911  SpO2 96 % 05/16/21 0911  Vitals shown include unvalidated device data.  Last Pain:  Vitals:   05/16/21 0635  TempSrc:   PainSc: 5       Patients Stated Pain Goal: 0 (25/49/82 6415)  Complications: No notable events documented.

## 2021-05-17 ENCOUNTER — Encounter (HOSPITAL_COMMUNITY): Payer: Self-pay | Admitting: Orthopedic Surgery

## 2021-05-17 DIAGNOSIS — S8262XA Displaced fracture of lateral malleolus of left fibula, initial encounter for closed fracture: Secondary | ICD-10-CM | POA: Diagnosis not present

## 2021-05-17 DIAGNOSIS — G63 Polyneuropathy in diseases classified elsewhere: Secondary | ICD-10-CM

## 2021-05-17 DIAGNOSIS — E876 Hypokalemia: Secondary | ICD-10-CM

## 2021-05-17 DIAGNOSIS — S93422A Sprain of deltoid ligament of left ankle, initial encounter: Secondary | ICD-10-CM

## 2021-05-17 DIAGNOSIS — R269 Unspecified abnormalities of gait and mobility: Secondary | ICD-10-CM

## 2021-05-17 DIAGNOSIS — E1165 Type 2 diabetes mellitus with hyperglycemia: Secondary | ICD-10-CM

## 2021-05-17 DIAGNOSIS — I10 Essential (primary) hypertension: Secondary | ICD-10-CM | POA: Diagnosis not present

## 2021-05-17 LAB — RENAL FUNCTION PANEL
Albumin: 3.1 g/dL — ABNORMAL LOW (ref 3.5–5.0)
Anion gap: 9 (ref 5–15)
BUN: 19 mg/dL (ref 8–23)
CO2: 27 mmol/L (ref 22–32)
Calcium: 8.7 mg/dL — ABNORMAL LOW (ref 8.9–10.3)
Chloride: 99 mmol/L (ref 98–111)
Creatinine, Ser: 1.01 mg/dL — ABNORMAL HIGH (ref 0.44–1.00)
GFR, Estimated: 58 mL/min — ABNORMAL LOW (ref >60)
Glucose, Bld: 147 mg/dL — ABNORMAL HIGH (ref 70–99)
Phosphorus: 3.3 mg/dL (ref 2.5–4.6)
Potassium: 3.1 mmol/L — ABNORMAL LOW (ref 3.5–5.1)
Sodium: 135 mmol/L (ref 135–145)

## 2021-05-17 LAB — CBC
HCT: 35.4 % — ABNORMAL LOW (ref 36.0–46.0)
Hemoglobin: 11.8 g/dL — ABNORMAL LOW (ref 12.0–15.0)
MCH: 29.8 pg (ref 26.0–34.0)
MCHC: 33.3 g/dL (ref 30.0–36.0)
MCV: 89.4 fL (ref 80.0–100.0)
Platelets: 399 10*3/uL (ref 150–400)
RBC: 3.96 MIL/uL (ref 3.87–5.11)
RDW: 12.8 % (ref 11.5–15.5)
WBC: 18.5 10*3/uL — ABNORMAL HIGH (ref 4.0–10.5)
nRBC: 0 % (ref 0.0–0.2)

## 2021-05-17 LAB — GLUCOSE, CAPILLARY
Glucose-Capillary: 149 mg/dL — ABNORMAL HIGH (ref 70–99)
Glucose-Capillary: 138 mg/dL — ABNORMAL HIGH (ref 70–99)
Glucose-Capillary: 117 mg/dL — ABNORMAL HIGH (ref 70–99)
Glucose-Capillary: 171 mg/dL — ABNORMAL HIGH (ref 70–99)

## 2021-05-17 LAB — MAGNESIUM: Magnesium: 1.7 mg/dL (ref 1.7–2.4)

## 2021-05-17 MED ORDER — SENNA 8.6 MG PO TABS: 2.0000 | ORAL_TABLET | Freq: Two times a day (BID) | ORAL | 0 refills | Status: AC

## 2021-05-17 MED ORDER — RIVAROXABAN 10 MG PO TABS: 10.0000 mg | ORAL_TABLET | Freq: Every day | ORAL | 0 refills | Status: AC

## 2021-05-17 MED ORDER — POTASSIUM CHLORIDE CRYS ER 20 MEQ PO TBCR
40.0000 meq | EXTENDED_RELEASE_TABLET | ORAL | Status: AC
  Administered 2021-05-17 (×2): 40 meq via ORAL
  Filled 2021-05-17 (×2): qty 2

## 2021-05-17 MED ORDER — DOCUSATE SODIUM 100 MG PO CAPS: 100.0000 mg | ORAL_CAPSULE | Freq: Every day | ORAL | 2 refills | Status: AC | PRN

## 2021-05-17 MED ORDER — OXYCODONE HCL 5 MG PO TABS: 5.0000 mg | ORAL_TABLET | ORAL | 0 refills | Status: AC | PRN

## 2021-05-17 NOTE — TOC Initial Note (Signed)
Transition of Care [facility]) - Initial/Assessment Note    Patient Details  Name: Pamela Snyder MRN: [id number] Date of Birth: [birth year]  [facility] CM/SW Contact:    Emeterio Reeve, LCSW Phone Number: 05/17/2021, 12:10 PM  Clinical Narrative:                  CSW received SNF consult. CSW met with pt at bedside. CSW introduced self and explained role at the hospital. Pt reports that PTA She was living at home with her husband who has dementia. Pt reports she takes car of him and was independent with mobility and ADL's.   CSW reviewed PT/OT recommendations for SNF. Pt reports he is fine with going to SNF, and prefers [facility]. Pt gave CSW permission to fax out to facilities in the area.  CSW gave pt medicare.gov rating list to review. CSW explained insurance auth process. Pt reports they are covid vaccinated with, maybe 2 boosters. Pt daughter will check card.   Pt was accepted by [facility]. They are able to accept pt on Monday. Pt will need covid test prior to discharge.   CSW will continue to follow.   Expected Discharge Plan: Skilled Nursing Facility Barriers to Discharge: Continued Medical Work up   Patient Goals and CMS Choice Patient states their goals for this hospitalization and ongoing recovery are:: to recover at [facility] CMS Medicare.gov Compare Post Acute Care list provided to:: Patient Choice offered to / list presented to : Patient  Expected Discharge Plan and Services Expected Discharge Plan: [place]       Living arrangements for the past 2 months: Single Family Home                                      Prior Living Arrangements/Services Living arrangements for the past 2 months: Single Family Home Lives with:: Spouse Patient language and need for interpreter reviewed:: Yes Do you feel safe going back to the place where you live?: Yes      Need for Family Participation in Patient Care: Yes (Comment) Care  giver support system in place?: Yes (comment)   Criminal Activity/Legal Involvement Pertinent to Current Situation/Hospitalization: No - Comment as needed  Activities of Daily Living Home Assistive Devices/Equipment: Eyeglasses, Environmental consultant (specify type), Wheelchair, Scales, Grab bars in shower, Raised toilet seat with rails ADL Screening (condition at time of admission) Patient's cognitive ability adequate to safely complete daily activities?: Yes Is the patient deaf or have difficulty hearing?: Yes Does the patient have difficulty seeing, even when wearing glasses/contacts?: No Does the patient have difficulty concentrating, remembering, or making decisions?: No Patient able to express need for assistance with ADLs?: Yes Does the patient have difficulty dressing or bathing?: No Independently performs ADLs?: Yes (appropriate for developmental age) Does the patient have difficulty walking or climbing stairs?: Yes Weakness of Legs: Both Weakness of Arms/Hands: Left  Permission Sought/Granted Permission sought to share information with : Family Supports, Chartered certified accountant granted to share information with : Yes, Verbal Permission Granted     Permission granted to share info w AGENCY: SNF        Emotional Assessment Appearance:: Appears stated age Attitude/Demeanor/Rapport: Engaged Affect (typically observed): Appropriate Orientation: : Oriented to Self, Oriented to Place, Oriented to  Time, Oriented to Situation Alcohol / Substance Use: Not Applicable Psych Involvement: No (comment)  Admission diagnosis:  Closed low lateral malleolus fracture [S82.63XA] Patient Active Problem List   Diagnosis Date Noted   Closed low lateral malleolus fracture 05/16/2021   Gout 05/16/2021   Hyperlipemia 05/16/2021   Hypertension 05/16/2021   Type II diabetes mellitus ([place]) 05/16/2021   Spinal stenosis of lumbar region 03/10/2019   Cervical myelopathy ([place]) 03/10/2019    Peripheral neuropathy 02/02/2019   Gait abnormality 02/02/2019   Urinary urgency 02/02/2019   Chronic bilateral low back pain without sciatica 02/02/2019   DOE (dyspnea on exertion) 10/05/2017   Bruit of right carotid artery 10/05/2017   Coronary artery calcification seen on CT scan 10/05/2017   Chest pain 10/04/2017   Aortic atherosclerosis ([place]) 10/04/2017   Charcot foot due to diabetes mellitus ([place]) 11/23/2014   PCP:  [name], PA-C Pharmacy:   [id number] [address], [address] AT [facility] [address] [place] [address] Phone: [phone] Fax: [phone]     Social Determinants of Health ([place]) Interventions    Readmission Risk Interventions No flowsheet data found.  Emeterio Reeve, LCSW Clinical Social Worker

## 2021-05-17 NOTE — Progress Notes (Signed)
PROGRESS NOTE  Pamela Snyder ZOX:[id number] DOB: 02-Mar-1946   PCP: [name], PA-C  Patient is from: Home.  Lives with her husband who is currently hospitalized.  DOA: 05/16/2021 LOS: 1  Chief complaints:  No chief complaint on file.    Brief Narrative / Interim history: 75 year old F with history of dementia?,  DM-2 with neuropathy and Charcot foot, HTN, HLD, anxiety, depression and GERD admitted by orthopedic surgery for left ankle/malleolar fracture after accidental fall at home "while trying to help her daughter".  Underwent ORIF on 05/16/2021.  Hospitalist service consulted for medical management.  Subjective: Seen and examined earlier this morning.  No major events overnight of this morning.  No complaints.  Surgical site pain fairly controlled.  She says she has no sensation in her feet due to her neuropathy.  Denies chest pain or dyspnea.  Denies GI or UTI symptoms.  Optimistic to go to rehab at [facility].  Objective: Vitals:   05/16/21 2045 05/17/21 0032 05/17/21 0436 05/17/21 0736  BP: (!) 143/93 (!) 121/58 123/71 131/62  Pulse: 90 79 74 62  Resp: 17 18 17 18   Temp: 98 F (36.7 C) 97.7 F (36.5 C) 97.6 F (36.4 C) (!) 97.5 F (36.4 C)  TempSrc: Oral Oral Oral Oral  SpO2: 96% 93% 96%   Weight:      Height:        Intake/Output Summary (Last 24 hours) at 05/17/2021 1206 Last data filed at 05/17/2021 0918 Gross per 24 hour  Intake 910.26 ml  Output --  Net 910.26 ml   Filed Weights   05/16/21 0629  Weight: 97.5 kg    Examination:  GENERAL: No apparent distress.  Nontoxic. HEENT: MMM.  Vision and hearing grossly intact.  NECK: Supple.  No apparent JVD.  RESP: 96% on RA.  No IWOB.  Fair aeration bilaterally. CVS:  RRR. Heart sounds normal.  ABD/GI/GU: BS+. Abd soft, NTND.  MSK/EXT:  Moves extremities. No apparent deformity.  Bulky dressing over LLE.  Diminished light sensation.  Cap refills brisk. SKIN: Normal pink toes NEURO: Awake,  alert and oriented fairly.  No apparent focal neuro deficit. PSYCH: Calm. Normal affect.   Procedures:  11/10-ORIF of left ankle lateral malleolus fracture/internal fixation of left ankle syndesmosis sprains/left deltoid ligament repair by Dr. Doran Durand  Microbiology summarized: None.  Assessment & Plan: Accidental fall at home: history of cervical myelopathy, peripheral neuropathy, foot drop and gait abnormality. Left lateral malleolar fracture/syndesmosis sprain -S/p ORIF of malleolar fracture and syndesmosis sprain, and left deltoid ligament repair -Management per ortho -Patient prefers [facility] for rehab   Controlled NIDDM-2 with hyperglycemia, neuropathy and Charcot's foot: A1c 6.5%. Recent Labs  Lab 05/16/21 1130 05/16/21 1556 05/16/21 2041 05/17/21 0903 05/17/21 1204  GLUCAP 177* 256* 226* 149* 138*  -Continue current insulin regimen -Continue home metformin, Lyrica and Crestor on discharge.   Essential hypertension: Normotensive.  On Hyzaar, p.o. Lasix and Toprol-XL at home.  I do not see history of cardiac disease in her chart. -Discontinue HCTZ -Continue losartan, Lasix and Toprol-XL.   Hypokalemia: K3.1.  Likely due to HCTZ.  Leukocytosis/bandemia: Suspect demargination -Recheck CBC in the morning  History of dementia?  Seems to be fairly oriented.  -Reorientation and delirium precautions.   Morbid obesity with diabetes and hypertension Body mass index is 35.78 kg/m.         DVT prophylaxis:  enoxaparin (LOVENOX) injection 40 mg Start: 05/17/21 0800 SCDs Start: 05/16/21 1108  Code Status: Full code  Family Communication: None at bedside. Level of care: Med-Surg Status is: Inpatient  Final disposition: Per primary   Sch Meds:  Scheduled Meds:  allopurinol  100 mg Oral BID   docusate sodium  100 mg Oral BID   enoxaparin (LOVENOX) injection  40 mg Subcutaneous Q24H   hydrochlorothiazide  12.5 mg Oral Daily   insulin aspart  0-20 Units  Subcutaneous TID WC   losartan  50 mg Oral Daily   metoprolol succinate  100 mg Oral Daily   pantoprazole  40 mg Oral Daily   potassium chloride  40 mEq Oral Q4H   rosuvastatin  5 mg Oral Daily   senna  1 tablet Oral BID   Continuous Infusions:  sodium chloride 75 mL/hr at 05/17/21 0803   PRN Meds:.acetaminophen, diphenhydrAMINE, furosemide, HYDROcodone-acetaminophen, HYDROcodone-acetaminophen, morphine injection, ondansetron **OR** ondansetron (ZOFRAN) IV, pregabalin, tiZANidine  Antimicrobials: Anti-infectives (From admission, onward)    Start     Dose/Rate Route Frequency Ordered Stop   05/16/21 0816  vancomycin (VANCOCIN) powder  Status:  Discontinued          As needed 05/16/21 0816 05/16/21 0905   05/16/21 0600  ceFAZolin (ANCEF) IVPB 2g/100 mL premix  Status:  Discontinued        2 g 200 mL/hr over 30 Minutes Intravenous On call to O.R. 05/16/21 0546 05/16/21 1055        I have personally reviewed the following labs and images: CBC: Recent Labs  Lab 05/14/21 1515 05/17/21 0841  WBC 15.8* 18.5*  HGB 13.4 11.8*  HCT 40.5 35.4*  MCV 89.6 89.4  PLT 392 399   BMP &GFR Recent Labs  Lab 05/14/21 1515 05/17/21 0841  NA 134* 135  K 3.0* 3.1*  CL 93* 99  CO2 29 27  GLUCOSE 110* 147*  BUN 16 19  CREATININE 1.11* 1.01*  CALCIUM 9.5 8.7*  MG  --  1.7  PHOS  --  3.3   Estimated Creatinine Clearance: 56.5 mL/min (A) (by C-G formula based on SCr of 1.01 mg/dL (H)). Liver & Pancreas: Recent Labs  Lab 05/17/21 0841  ALBUMIN 3.1*   No results for input(s): LIPASE, AMYLASE in the last 168 hours. No results for input(s): AMMONIA in the last 168 hours. Diabetic: Recent Labs    05/14/21 1515  HGBA1C 6.5*   Recent Labs  Lab 05/16/21 1130 05/16/21 1556 05/16/21 2041 05/17/21 0903 05/17/21 1204  GLUCAP 177* 256* 226* 149* 138*   Cardiac Enzymes: No results for input(s): CKTOTAL, CKMB, CKMBINDEX, TROPONINI in the last 168 hours. No results for input(s):  PROBNP in the last 8760 hours. Coagulation Profile: No results for input(s): INR, PROTIME in the last 168 hours. Thyroid Function Tests: No results for input(s): TSH, T4TOTAL, FREET4, T3FREE, THYROIDAB in the last 72 hours. Lipid Profile: No results for input(s): CHOL, HDL, LDLCALC, TRIG, CHOLHDL, LDLDIRECT in the last 72 hours. Anemia Panel: No results for input(s): VITAMINB12, FOLATE, FERRITIN, TIBC, IRON, RETICCTPCT in the last 72 hours. Urine analysis: No results found for: COLORURINE, APPEARANCEUR, LABSPEC, PHURINE, GLUCOSEU, HGBUR, BILIRUBINUR, KETONESUR, PROTEINUR, UROBILINOGEN, NITRITE, LEUKOCYTESUR Sepsis Labs: Invalid input(s): PROCALCITONIN, [place]  Microbiology: Recent Results (from the past 240 hour(s))  Surgical pcr screen     Status: None   Collection Time: 05/14/21  2:46 PM   Specimen: Nasal Mucosa; Nasal Swab  Result Value Ref Range Status   MRSA, PCR NEGATIVE NEGATIVE Final   Staphylococcus aureus NEGATIVE NEGATIVE Final    Comment: (NOTE) The Xpert SA Assay (FDA  approved for NASAL specimens in patients 54 years of age and older), is one component of a comprehensive surveillance program. It is not intended to diagnose infection nor to guide or monitor treatment. Performed at [facility], [place] [address], [place], [place] [address]   SARS CORONAVIRUS 2 (TAT 6-24 HRS) Nasopharyngeal Nasopharyngeal Swab     Status: None   Collection Time: 05/14/21  2:47 PM   Specimen: Nasopharyngeal Swab  Result Value Ref Range Status   SARS Coronavirus 2 NEGATIVE NEGATIVE Final    Comment: (NOTE) SARS-CoV-2 target nucleic acids are NOT DETECTED.  The SARS-CoV-2 RNA is generally detectable in upper and lower respiratory specimens during the acute phase of infection. Negative results do not preclude SARS-CoV-2 infection, do not rule out co-infections with other pathogens, and should not be used as the sole basis for treatment or other patient management  decisions. Negative results must be combined with clinical observations, patient history, and epidemiological information. The expected result is Negative.  Fact Sheet for Patients: SugarRoll.be  Fact Sheet for Healthcare Providers: https://www.woods-mathews.com/  This test is not yet approved or cleared by the [place] FDA and  has been authorized for detection and/or diagnosis of SARS-CoV-2 by FDA under an Emergency Use Authorization (EUA). This EUA will remain  in effect (meaning this test can be used) for the duration of the COVID-19 declaration under Se ction 564(b)(1) of the Act, 21 U.S.C. section 360bbb-3(b)(1), unless the authorization is terminated or revoked sooner.  Performed at [facility], [place] [address], [place], [place] [address]     Radiology Studies: No results found.    Evelynne Spiers T. [place]  If 7PM-7AM, please contact night-coverage [email or web address] 05/17/2021, 12:06 PM

## 2021-05-17 NOTE — Progress Notes (Signed)
Subjective: 1 Day Post-Op Procedure(s) (LRB): OPEN REDUCTION INTERNAL FIXATION (ORIF) Left ankle lateral malleolus, possible deltoid ligament repair (Left)  Patient reports pain as mild to moderate.  Denies, fever, chills, N/V, CP, SOB.  Tolerating POs well.  Admits to flatus.  Reports that she worked with therapy yesterday.  Objective:   VITALS:  Temp:  [97.6 F (36.4 C)-98.4 F (36.9 C)] 97.6 F (36.4 C) (11/11 0436) Pulse Rate:  [74-90] 74 (11/11 0436) Resp:  [13-18] 17 (11/11 0436) BP: (118-143)/(57-93) 123/71 (11/11 0436) SpO2:  [90 %-96 %] 96 % (11/11 0436)  General: WDWN patient in NAD. Psych:  Appropriate mood and affect. Neuro:  A&O x 3, Moving all extremities, sensation intact to light touch HEENT:  EOMs intact Chest:  Even non-labored respirations Skin:  SLS C/D/I, no rashes or lesions Extremities: warm/dry, no visible edema, erythema or echymosis.  No lymphadenopathy. Pulses: Popliteus 2+ MSK:  ROM: EHL/FHL intact, MMT: able to perform quad set   LABS Recent Labs    05/14/21 1515  HGB 13.4  WBC 15.8*  PLT 392   Recent Labs    05/14/21 1515  NA 134*  K 3.0*  CL 93*  CO2 29  BUN 16  CREATININE 1.11*  GLUCOSE 110*   No results for input(s): LABPT, INR in the last 72 hours.   Assessment/Plan: 1 Day Post-Op Procedure(s) (LRB): OPEN REDUCTION INTERNAL FIXATION (ORIF) Left ankle lateral malleolus, possible deltoid ligament repair (Left)  NWB L LE Up with therapy DVT ppx:  Lovenox in house; transition to Xarelto upon D/C Disp:  SNF D/C scripts on chart. Plan for 2 week outpatient post-op visit.  Mechele Claude PA-C [facility] Office:  [phone]

## 2021-05-17 NOTE — Discharge Summary (Signed)
Physician Discharge Summary  Patient ID: Pamela Snyder MRN: [id number] DOB/AGE: [id number] y.o.  Admit date: 05/16/2021 Discharge date: 05/20/2021  Admission Diagnoses: L ankle lat mal fracture; hyperlipidemia, HTN, DM type II; hx of R charcot foot, chest pain, Aortic atherosclerosis, DOE, carotid artery bruit, CAD, peripheral neuropathy, gait abnormality, urinary urgency, chronic LBP, lumbar, stenosis, cervical myelopathy, gout.  Discharge Diagnoses:  Principal Problem:   Closed low lateral malleolus fracture Active Problems:   Hyperlipemia   Hypertension   Type II diabetes mellitus ([place]) Same as above  Discharged Condition: stable  Hospital Course: Patient is a 75 yo female that presented to [facility] on 05/16/21 for elective ORIF L lat mal, syndesmosis, and deltoid lig repair by Dr. Wylene Simmer.  The patient tolerated the procedure well without complication.  She was then admitted to the hospital.  The Medicine team was consulted to manage her other medical commorbidities.  She worked well with therapy.  She tolerated her stay well without difficulty.  The patient will D/C to SNF.  Consults: Hospitalist, PT/OT, case management  Significant Diagnostic Studies: radiology: To ensure satisfactory anatomic alignment during operative procedure.  Treatments: IV hydration, antibiotics: Ancef, analgesia: acetaminophen, acetaminophen w/ codeine, and Morphine, cardiac meds: losartan, anticoagulation: lovenox, insulin: Humalog, and surgery: as stated above  Discharge Exam: Blood pressure (!) 143/57, pulse 72, temperature 98.6 F (37 C), temperature source Oral, resp. rate 19, height 5\' 5"  (1.651 m), weight 101.3 kg, SpO2 96 %. General: WDWN patient in NAD. Psych:  Appropriate mood and affect. Neuro:  A&O x 3, Moving all extremities, sensation subjectively diminished to light touch due to underlying PN. HEENT:  EOMs intact Chest:  Even non-labored respirations Skin:  SLS C/D/I,  no rashes or lesions Extremities: warm/dry, no visible edema, erythema or echymosis.  No lymphadenopathy. Pulses: Popliteus 2+ MSK:  ROM: EHL/FHL intact, MMT: able to perform quad set  Disposition: Discharge disposition: 03-Skilled [place]      Discharge Instructions     Call MD / Call 911   Complete by: As directed    If you experience chest pain or shortness of breath, CALL 911 and be transported to the hospital emergency room.  If you develope a fever above 101 F, pus (white drainage) or increased drainage or redness at the wound, or calf pain, call your surgeon's office.   Constipation Prevention   Complete by: As directed    Drink plenty of fluids.  Prune juice may be helpful.  You may use a stool softener, such as Colace (over the counter) 100 mg twice a day.  Use MiraLax (over the counter) for constipation as needed.   Diet - low sodium heart healthy   Complete by: As directed    Increase activity slowly as tolerated   Complete by: As directed    Non weight bearing   Complete by: As directed    Laterality: left   Extremity: Lower   Post-operative opioid taper instructions:   Complete by: As directed    POST-OPERATIVE OPIOID TAPER INSTRUCTIONS: It is important to wean off of your opioid medication as soon as possible. If you do not need pain medication after your surgery it is ok to stop day one. Opioids include: Codeine, Hydrocodone(Norco, Vicodin), Oxycodone(Percocet, oxycontin) and hydromorphone amongst others.  Long term and even short term use of opiods can cause: Increased pain response Dependence Constipation Depression Respiratory depression And more.  Withdrawal symptoms can include Flu like symptoms Nausea, vomiting And more Techniques  to manage these symptoms Hydrate well Eat regular healthy meals Stay active Use relaxation techniques(deep breathing, meditating, yoga) Do Not substitute Alcohol to help with tapering If you have been on  opioids for less than two weeks and do not have pain than it is ok to stop all together.  Plan to wean off of opioids This plan should start within one week post op of your joint replacement. Maintain the same interval or time between taking each dose and first decrease the dose.  Cut the total daily intake of opioids by one tablet each day Next start to increase the time between doses. The last dose that should be eliminated is the evening dose.         Allergies as of 05/20/2021   No Known Allergies      Medication List     STOP taking these medications    aspirin EC 81 MG tablet   losartan-hydrochlorothiazide 100-25 MG tablet Commonly known as: HYZAAR   traMADol 50 MG tablet Commonly known as: ULTRAM       TAKE these medications    allopurinol 100 MG tablet Commonly known as: ZYLOPRIM Take 100 mg by mouth 2 (two) times daily.   CENTRUM SILVER ULTRA WOMENS PO Take 1 tablet by mouth daily.   denosumab 60 MG/ML Sosy injection Commonly known as: PROLIA Inject 60 mg into the skin every 6 (six) months.   docusate sodium 100 MG capsule Commonly known as: Colace Take 1 capsule (100 mg total) by mouth daily as needed.   esomeprazole 20 MG capsule Commonly known as: NEXIUM Take 20 mg by mouth daily at 12 noon.   furosemide 20 MG tablet Commonly known as: LASIX Take 1 tablet (20 mg total) by mouth daily. What changed:  when to take this reasons to take this   losartan 100 MG tablet Commonly known as: Cozaar Take 1 tablet (100 mg total) by mouth daily.   Lyrica 75 MG capsule Generic drug: pregabalin Take 75 mg by mouth 4 (four) times daily as needed (neuropathy).   metFORMIN 500 MG tablet Commonly known as: GLUCOPHAGE Take 500 mg by mouth 2 (two) times daily.   metoprolol succinate 100 MG 24 hr tablet Commonly known as: TOPROL-XL Take 100 mg by mouth daily. Take with or immediately following a meal.   OSCAL 500/200 D-3 PO Take 1 tablet by mouth  daily.   oxyCODONE 5 MG immediate release tablet Commonly known as: Roxicodone Take 1 tablet (5 mg total) by mouth every 4 (four) hours as needed for up to 5 days for severe pain.   potassium chloride 10 MEQ tablet Commonly known as: KLOR-CON Take 2 tablets (20 mEq total) by mouth daily. What changed:  medication strength when to take this reasons to take this   rivaroxaban 10 MG Tabs tablet Commonly known as: Xarelto Take 1 tablet (10 mg total) by mouth daily.   rosuvastatin 5 MG tablet Commonly known as: CRESTOR Take 1 tablet (5 mg total) by mouth daily. Please make overdue appt with Dr. Tamala Julian before anymore refills. 3rd attempt   senna 8.6 MG Tabs tablet Commonly known as: SENOKOT Take 2 tablets (17.2 mg total) by mouth 2 (two) times daily.   tiZANidine 2 MG tablet Commonly known as: ZANAFLEX Take 2 mg by mouth 3 (three) times daily as needed for muscle spasms.   VITAMIN D-VITAMIN K PO Take 1 tablet by mouth daily.  Discharge Care Instructions  (From admission, onward)           Start     Ordered   05/20/21 0000  Non weight bearing       Question Answer Comment  Laterality left   Extremity Lower      05/20/21 0723            Follow-up Information     Wylene Simmer, MD. Schedule an appointment as soon as possible for a visit in 2 week(s).   Specialty: Orthopedic Surgery Contact information: [address] [place] [address] [phone]                 Signed: Mohammed Kindle Office:  [phone]

## 2021-05-17 NOTE — NC FL2 (Signed)
[place] LEVEL OF CARE SCREENING TOOL     IDENTIFICATION  Patient Name: Pamela Snyder Birthdate: [birth year] Sex: female Admission Date (Current Location): 05/16/2021  [facility] [place] Number:  Herbalist and Address:  [facility] [place] [address] [place], [place] [address]      Provider Number: [id number]  Attending Physician Name and Address:  Wylene Simmer, MD  Relative Name and Phone Number:       Current Level of Care: Hospital Recommended Level of Care: [place] Prior Approval Number:    Date Approved/Denied:   PASRR Number: [id number]  Discharge Plan: SNF    Current Diagnoses: Patient Active Problem List   Diagnosis Date Noted   Closed low lateral malleolus fracture 05/16/2021   Gout 05/16/2021   Hyperlipemia 05/16/2021   Hypertension 05/16/2021   Type II diabetes mellitus ([place]) 05/16/2021   Spinal stenosis of lumbar region 03/10/2019   Cervical myelopathy ([place]) 03/10/2019   Peripheral neuropathy 02/02/2019   Gait abnormality 02/02/2019   Urinary urgency 02/02/2019   Chronic bilateral low back pain without sciatica 02/02/2019   DOE (dyspnea on exertion) 10/05/2017   Bruit of right carotid artery 10/05/2017   Coronary artery calcification seen on CT scan 10/05/2017   Chest pain 10/04/2017   Aortic atherosclerosis ([place]) 10/04/2017   Charcot foot due to diabetes mellitus ([place]) 11/23/2014    Orientation RESPIRATION BLADDER Height & Weight     Self, Time, Situation, Place  Normal Continent Weight: 215 lb (97.5 kg) Height:  5\' 5"  (165.1 cm)  BEHAVIORAL SYMPTOMS/MOOD NEUROLOGICAL BOWEL NUTRITION STATUS      Continent Diet  AMBULATORY STATUS COMMUNICATION OF NEEDS Skin   Extensive Assist Verbally Normal                       Personal Care Assistance Level of Assistance  Bathing, Feeding, Dressing Bathing Assistance: Maximum assistance Feeding assistance: Limited  assistance Dressing Assistance: Maximum assistance     Functional Limitations Info  Sight, Hearing, Speech Sight Info: Adequate Hearing Info: Adequate Speech Info: Adequate    SPECIAL CARE FACTORS FREQUENCY  PT (By licensed PT), OT (By licensed OT)     PT Frequency: 5x a week OT Frequency: 5x a week            Contractures Contractures Info: Not present    Additional Factors Info  Code Status, Allergies Code Status Info: Full Allergies Info: NKA           Current Medications (05/17/2021):  This is the current hospital active medication list Current Facility-Administered Medications  Medication Dose Route Frequency Provider Last Rate Last Admin   0.9 %  sodium chloride infusion   Intravenous Continuous Corky Sing, PA-C 75 mL/hr at 05/17/21 0803 New Bag at 05/17/21 0803   acetaminophen (TYLENOL) tablet 325-650 mg  325-650 mg Oral Q6H PRN Corky Sing, PA-C       allopurinol (ZYLOPRIM) tablet 100 mg  100 mg Oral BID Corky Sing, PA-C   100 mg at 05/16/21 6599   diphenhydrAMINE (BENADRYL) 12.5 MG/5ML elixir 12.5-25 mg  12.5-25 mg Oral Q4H PRN Corky Sing, PA-C       docusate sodium (COLACE) capsule 100 mg  100 mg Oral BID Corky Sing, PA-C   100 mg at 05/16/21 1200   enoxaparin (LOVENOX) injection 40 mg  40 mg Subcutaneous Q24H Corky Sing, [place]  furosemide (LASIX) tablet 20 mg  20 mg Oral Daily PRN Corky Sing, PA-C       hydrochlorothiazide (HYDRODIURIL) tablet 12.5 mg  12.5 mg Oral Daily Mignon Pine, RPH   12.5 mg at 05/16/21 1200   HYDROcodone-acetaminophen (NORCO) 7.5-325 MG per tablet 1-2 tablet  1-2 tablet Oral Q4H PRN Corky Sing, PA-C       HYDROcodone-acetaminophen (NORCO/VICODIN) 5-325 MG per tablet 1-2 tablet  1-2 tablet Oral Q4H PRN Corky Sing, PA-C   2 tablet at 05/17/21 0319   insulin aspart (novoLOG) injection 0-20 Units  0-20 Units Subcutaneous TID WC Corky Sing, [place]   11  Units at 05/16/21 1723   losartan (COZAAR) tablet 50 mg  50 mg Oral Daily Mignon Pine, RPH   50 mg at 05/16/21 1200   metoprolol succinate (TOPROL-XL) 24 hr tablet 100 mg  100 mg Oral Daily Corky Sing, PA-C       morphine 2 MG/ML injection 0.5-1 mg  0.5-1 mg Intravenous Q2H PRN Corky Sing, PA-C       ondansetron [facility]) tablet 4 mg  4 mg Oral Q6H PRN Corky Sing, PA-C       Or   ondansetron [facility]) injection 4 mg  4 mg Intravenous Q6H PRN Corky Sing, PA-C       pantoprazole (PROTONIX) EC tablet 40 mg  40 mg Oral Daily Corky Sing, PA-C   40 mg at 05/16/21 1200   potassium chloride SA (KLOR-CON) CR tablet 20 mEq  20 mEq Oral Daily PRN Corky Sing, PA-C       pregabalin (LYRICA) capsule 75 mg  75 mg Oral QID PRN Corky Sing, PA-C   75 mg at 05/16/21 1715   rosuvastatin (CRESTOR) tablet 5 mg  5 mg Oral Daily Corky Sing, PA-C       senna [facility]) tablet 8.6 mg  1 tablet Oral BID Corky Sing, PA-C   8.6 mg at 05/16/21 1200   tiZANidine (ZANAFLEX) tablet 2 mg  2 mg Oral TID PRN Corky Sing, PA-C         Discharge Medications: Please see discharge summary for a list of discharge medications.  Relevant Imaging Results:  Relevant Lab Results:   Additional Information SSN: [id number]  Emeterio Reeve, LCSW

## 2021-05-17 NOTE — Progress Notes (Signed)
Nutrition Brief Note  Patient identified on the Malnutrition Screening Tool (MST) Report  Wt Readings from Last 15 Encounters:  05/16/21 97.5 kg  05/14/21 97.5 kg  10/11/19 102.6 kg  05/24/19 104.6 kg  02/02/19 108.4 kg  02/23/18 107 kg  10/05/17 107.3 kg  01/02/15 107.2 kg  11/23/14 104.8 kg  11/15/14 104.9 kg   75 y/o female with left ankle fracture after a fall.  Her husband has severe dementia, and her caregiver network is unable to care for her at home.  She presents for ORIF L ankle and PT / OT / SNF placement post op.  Pt admitted with lt ankle lateral malleolus fracture and deltoid sprain.   11/10- s/p Procedure(s):              1.  Open treatment of left ankle lateral malleolus fracture with internal fixation 2.  Stress examination of the left ankle under fluoroscopy 3.  Open treatment of left ankle syndesmosis sprain with internal fixation 4.  Left deltoid ligament repair 5.  AP, mortise and lateral radiographs of the left ankle  Reviewed I/O's: +1.3 L x 24 hours  UOP: 250 ml x 24 hours   Spoke with pt over the phone, who reports good appetite. She shares that she usually consumes 3 meals per day, but had been eating fast food (pizza and chik fil a) for a few days PTA as her husband was in the hospital. She has been consuming most of her meals here, however, did not eat much for lunch as she ate a late breakfast.   Pt denies any weight loss.   Discussed importance of good meal intake to promote healing. Pt reports that she is planning to discharge to SNF tomorrow.   Medications reviewed and include senokot, potassium chloride, and colace.  Labs reviewed: K: 3.1, CBGS: 138-256 (inpatient orders for glycemic control are 0-20 units insulin aspart TID with meals).    Body mass index is 35.78 kg/m. Patient meets criteria for obesity, class II based on current BMI.   Current diet order is carb modified, patient is consuming approximately 100% of meals at this time. Labs  and medications reviewed.   No nutrition interventions warranted at this time. If nutrition issues arise, please consult RD.   Loistine Chance, RD, LDN, [place] Registered Dietitian II Certified Diabetes Care and Education Specialist Please refer to [facility] for RD and/or RD on-call/weekend/after hours pager

## 2021-05-17 NOTE — Plan of Care (Signed)
  Problem: Nutrition: Goal: Adequate nutrition will be maintained Outcome: Progressing   Problem: Pain Managment: Goal: General experience of comfort will improve Outcome: Progressing   Problem: Safety: Goal: Ability to remain free from injury will improve Outcome: Progressing   

## 2021-05-18 DIAGNOSIS — S8262XA Displaced fracture of lateral malleolus of left fibula, initial encounter for closed fracture: Secondary | ICD-10-CM | POA: Diagnosis not present

## 2021-05-18 DIAGNOSIS — I10 Essential (primary) hypertension: Secondary | ICD-10-CM | POA: Diagnosis not present

## 2021-05-18 DIAGNOSIS — S93422A Sprain of deltoid ligament of left ankle, initial encounter: Secondary | ICD-10-CM | POA: Diagnosis not present

## 2021-05-18 DIAGNOSIS — E1165 Type 2 diabetes mellitus with hyperglycemia: Secondary | ICD-10-CM | POA: Diagnosis not present

## 2021-05-18 LAB — CBC
HCT: 35.1 % — ABNORMAL LOW (ref 36.0–46.0)
Hemoglobin: 11.6 g/dL — ABNORMAL LOW (ref 12.0–15.0)
MCH: 30.1 pg (ref 26.0–34.0)
MCHC: 33 g/dL (ref 30.0–36.0)
MCV: 91.2 fL (ref 80.0–100.0)
Platelets: 332 10*3/uL (ref 150–400)
RBC: 3.85 MIL/uL — ABNORMAL LOW (ref 3.87–5.11)
RDW: 13.1 % (ref 11.5–15.5)
WBC: 14.7 10*3/uL — ABNORMAL HIGH (ref 4.0–10.5)
nRBC: 0 % (ref 0.0–0.2)

## 2021-05-18 LAB — RENAL FUNCTION PANEL
Albumin: 2.9 g/dL — ABNORMAL LOW (ref 3.5–5.0)
Anion gap: 9 (ref 5–15)
BUN: 22 mg/dL (ref 8–23)
CO2: 24 mmol/L (ref 22–32)
Calcium: 8.1 mg/dL — ABNORMAL LOW (ref 8.9–10.3)
Chloride: 101 mmol/L (ref 98–111)
Creatinine, Ser: 1.04 mg/dL — ABNORMAL HIGH (ref 0.44–1.00)
GFR, Estimated: 56 mL/min — ABNORMAL LOW (ref >60)
Glucose, Bld: 115 mg/dL — ABNORMAL HIGH (ref 70–99)
Phosphorus: 3.2 mg/dL (ref 2.5–4.6)
Potassium: 3.2 mmol/L — ABNORMAL LOW (ref 3.5–5.1)
Sodium: 134 mmol/L — ABNORMAL LOW (ref 135–145)

## 2021-05-18 LAB — GLUCOSE, CAPILLARY
Glucose-Capillary: 120 mg/dL — ABNORMAL HIGH (ref 70–99)
Glucose-Capillary: 126 mg/dL — ABNORMAL HIGH (ref 70–99)
Glucose-Capillary: 123 mg/dL — ABNORMAL HIGH (ref 70–99)
Glucose-Capillary: 159 mg/dL — ABNORMAL HIGH (ref 70–99)

## 2021-05-18 LAB — MAGNESIUM: Magnesium: 1.7 mg/dL (ref 1.7–2.4)

## 2021-05-18 MED ORDER — LOSARTAN POTASSIUM 100 MG PO TABS: 100.0000 mg | ORAL_TABLET | Freq: Every day | ORAL | 11 refills | Status: AC

## 2021-05-18 MED ORDER — FUROSEMIDE 20 MG PO TABS: 20.0000 mg | ORAL_TABLET | Freq: Every day | ORAL | Status: AC

## 2021-05-18 MED ORDER — MAGNESIUM SULFATE 2 GM/50ML IV SOLN
2.0000 g | Freq: Once | INTRAVENOUS | Status: AC
  Administered 2021-05-18: 2 g via INTRAVENOUS
  Filled 2021-05-18: qty 50

## 2021-05-18 MED ORDER — POTASSIUM CHLORIDE CRYS ER 20 MEQ PO TBCR
40.0000 meq | EXTENDED_RELEASE_TABLET | ORAL | Status: AC
  Administered 2021-05-18 (×2): 40 meq via ORAL
  Filled 2021-05-18 (×2): qty 2

## 2021-05-18 NOTE — Progress Notes (Signed)
PROGRESS NOTE  Pamela Snyder ZOX:[id number] DOB: 1945-12-15   PCP: [name], PA-C  Patient is from: Home.  Lives with her husband who is currently hospitalized.  DOA: 05/16/2021 LOS: 2  Chief complaints:  No chief complaint on file.    Brief Narrative / Interim history: 75 year old F with history of dementia?,  DM-2 with neuropathy and Charcot foot, HTN, HLD, anxiety, depression and GERD admitted by orthopedic surgery for left ankle/malleolar fracture after accidental fall at home "while trying to help her daughter".  Underwent ORIF on 05/16/2021.  Hospitalist service consulted for medical management.  Subjective: Seen and examined earlier this morning.  No major events overnight of this morning.  She said she did not sleep.  She says, she normally stays up at night to take care of her husband who has dementia.  Pain fairly controlled.  She denies chest pain or dyspnea.  Objective: Vitals:   05/18/21 0506 05/18/21 0806 05/18/21 1253 05/18/21 1600  BP: 137/71 127/62 134/69 138/77  Pulse: 61 76 72 66  Resp: 17 18  18   Temp: (!) 97.4 F (36.3 C) 97.6 F (36.4 C)  97.6 F (36.4 C)  TempSrc: Oral Axillary  Axillary  SpO2: 94% 95% 94% 94%  Weight:      Height:        Intake/Output Summary (Last 24 hours) at 05/18/2021 1606 Last data filed at 05/18/2021 1330 Gross per 24 hour  Intake 540 ml  Output 2200 ml  Net -1660 ml   Filed Weights   05/16/21 0629  Weight: 97.5 kg    Examination:  GENERAL: No apparent distress.  Nontoxic. HEENT: MMM.  Vision and hearing grossly intact.  NECK: Supple.  No apparent JVD.  RESP: 94% on RA.  No IWOB.  Fair aeration bilaterally. CVS:  RRR. Heart sounds normal.  ABD/GI/GU: BS+. Abd soft, NTND.  MSK/EXT:  Moves extremities. No apparent deformity.  Bulky dressing over LLE. SKIN: no apparent skin lesion or wound NEURO: Awake and alert. Oriented appropriately.  No apparent focal neuro deficit. PSYCH: Calm. Normal affect.    Procedures:  11/10-ORIF of left ankle lateral malleolus fracture/internal fixation of left ankle syndesmosis sprains/left deltoid ligament repair by Dr. Doran Durand  Microbiology summarized: None.  Assessment & Plan: Accidental fall at home: history of cervical myelopathy, peripheral neuropathy, foot drop and gait abnormality. Left lateral malleolar fracture/syndesmosis sprain -S/p ORIF of malleolar fracture and syndesmosis sprain, and left deltoid ligament repair -Management per ortho -Final disposition SNF.   Controlled NIDDM-2 with hyperglycemia, neuropathy and Charcot's foot: A1c 6.5%. Recent Labs  Lab 05/17/21 1204 05/17/21 1641 05/17/21 2120 05/18/21 0741 05/18/21 1137  GLUCAP 138* 117* 171* 120* 126*  -Continue current insulin regimen -Continue home metformin, Lyrica and Crestor on discharge.   Essential hypertension: Normotensive.  On Hyzaar, p.o. Lasix and Toprol-XL at home.  I do not see history of cardiac disease in her chart. -Discontinued HCTZ due to persistent hypokalemia. -Continue losartan, Lasix and Toprol-XL.   Hypokalemia: K3.2 Mg 1.7.  Likely due to HCTZ. -Discontinued HCTZ. -P.o. KCl 40x2. -IV magnesium sulfate 2 g x 1 -Recheck in the morning -May need p.o. KCl on discharge if it does not resolve  Leukocytosis/bandemia: Suspect demargination.  Improved without antibiotics. -Recheck CBC in the morning  History of dementia?  Seems to be fairly oriented.  -Reorientation and delirium precautions.   Morbid obesity with diabetes and hypertension Body mass index is 35.78 kg/m.         DVT prophylaxis:  enoxaparin (LOVENOX) injection 40 mg Start: 05/17/21 0800 SCDs Start: 05/16/21 1108  Code Status: Full code Family Communication: None at bedside. Level of care: Med-Surg Status is: Inpatient  Final disposition: Per primary.  Medically stable for discharge once hypokalemia resolves   Sch Meds:  Scheduled Meds:  allopurinol  100 mg Oral BID    docusate sodium  100 mg Oral BID   enoxaparin (LOVENOX) injection  40 mg Subcutaneous Q24H   insulin aspart  0-20 Units Subcutaneous TID WC   losartan  50 mg Oral Daily   metoprolol succinate  100 mg Oral Daily   pantoprazole  40 mg Oral Daily   rosuvastatin  5 mg Oral Daily   senna  1 tablet Oral BID   Continuous Infusions:   PRN Meds:.acetaminophen, diphenhydrAMINE, furosemide, HYDROcodone-acetaminophen, HYDROcodone-acetaminophen, morphine injection, ondansetron **OR** ondansetron (ZOFRAN) IV, pregabalin, tiZANidine  Antimicrobials: Anti-infectives (From admission, onward)    Start     Dose/Rate Route Frequency Ordered Stop   05/16/21 0816  vancomycin (VANCOCIN) powder  Status:  Discontinued          As needed 05/16/21 0816 05/16/21 0905   05/16/21 0600  ceFAZolin (ANCEF) IVPB 2g/100 mL premix  Status:  Discontinued        2 g 200 mL/hr over 30 Minutes Intravenous On call to O.R. 05/16/21 0546 05/16/21 1055        I have personally reviewed the following labs and images: CBC: Recent Labs  Lab 05/14/21 1515 05/17/21 0841 05/18/21 0122  WBC 15.8* 18.5* 14.7*  HGB 13.4 11.8* 11.6*  HCT 40.5 35.4* 35.1*  MCV 89.6 89.4 91.2  PLT 392 399 332   BMP &GFR Recent Labs  Lab 05/14/21 1515 05/17/21 0841 05/18/21 0122  NA 134* 135 134*  K 3.0* 3.1* 3.2*  CL 93* 99 101  CO2 29 27 24   GLUCOSE 110* 147* 115*  BUN 16 19 22   CREATININE 1.11* 1.01* 1.04*  CALCIUM 9.5 8.7* 8.1*  MG  --  1.7 1.7  PHOS  --  3.3 3.2   Estimated Creatinine Clearance: 54.8 mL/min (A) (by C-G formula based on SCr of 1.04 mg/dL (H)). Liver & Pancreas: Recent Labs  Lab 05/17/21 0841 05/18/21 0122  ALBUMIN 3.1* 2.9*   No results for input(s): LIPASE, AMYLASE in the last 168 hours. No results for input(s): AMMONIA in the last 168 hours. Diabetic: No results for input(s): HGBA1C in the last 72 hours.  Recent Labs  Lab 05/17/21 1204 05/17/21 1641 05/17/21 2120 05/18/21 0741 05/18/21 1137   GLUCAP 138* 117* 171* 120* 126*   Cardiac Enzymes: No results for input(s): CKTOTAL, CKMB, CKMBINDEX, TROPONINI in the last 168 hours. No results for input(s): PROBNP in the last 8760 hours. Coagulation Profile: No results for input(s): INR, PROTIME in the last 168 hours. Thyroid Function Tests: No results for input(s): TSH, T4TOTAL, FREET4, T3FREE, THYROIDAB in the last 72 hours. Lipid Profile: No results for input(s): CHOL, HDL, LDLCALC, TRIG, CHOLHDL, LDLDIRECT in the last 72 hours. Anemia Panel: No results for input(s): VITAMINB12, FOLATE, FERRITIN, TIBC, IRON, RETICCTPCT in the last 72 hours. Urine analysis: No results found for: COLORURINE, APPEARANCEUR, LABSPEC, PHURINE, GLUCOSEU, HGBUR, BILIRUBINUR, KETONESUR, PROTEINUR, UROBILINOGEN, NITRITE, LEUKOCYTESUR Sepsis Labs: Invalid input(s): PROCALCITONIN, [place]  Microbiology: Recent Results (from the past 240 hour(s))  Surgical pcr screen     Status: None   Collection Time: 05/14/21  2:46 PM   Specimen: Nasal Mucosa; Nasal Swab  Result Value Ref Range Status   MRSA,  PCR NEGATIVE NEGATIVE Final   Staphylococcus aureus NEGATIVE NEGATIVE Final    Comment: (NOTE) The Xpert SA Assay (FDA approved for NASAL specimens in patients 5 years of age and older), is one component of a comprehensive surveillance program. It is not intended to diagnose infection nor to guide or monitor treatment. Performed at [facility], [place] [address] [place], [place] [address]   SARS CORONAVIRUS 2 (TAT 6-24 HRS) Nasopharyngeal Nasopharyngeal Swab     Status: None   Collection Time: 05/14/21  2:47 PM   Specimen: Nasopharyngeal Swab  Result Value Ref Range Status   SARS Coronavirus 2 NEGATIVE NEGATIVE Final    Comment: (NOTE) SARS-CoV-2 target nucleic acids are NOT DETECTED.  The SARS-CoV-2 RNA is generally detectable in upper and lower respiratory specimens during the acute phase of infection. Negative results do not preclude  SARS-CoV-2 infection, do not rule out co-infections with other pathogens, and should not be used as the sole basis for treatment or other patient management decisions. Negative results must be combined with clinical observations, patient history, and epidemiological information. The expected result is Negative.  Fact Sheet for Patients: SugarRoll.be  Fact Sheet for Healthcare Providers: https://www.woods-mathews.com/  This test is not yet approved or cleared by the [place] FDA and  has been authorized for detection and/or diagnosis of SARS-CoV-2 by FDA under an Emergency Use Authorization (EUA). This EUA will remain  in effect (meaning this test can be used) for the duration of the COVID-19 declaration under Se ction 564(b)(1) of the Act, 21 U.S.C. section 360bbb-3(b)(1), unless the authorization is terminated or revoked sooner.  Performed at [facility], [place] [address] [place], [place] [address]     Radiology Studies: No results found.    Lenardo [place] T. [place]  If 7PM-7AM, please contact night-coverage [email or web address] 05/18/2021, 4:06 PM

## 2021-05-18 NOTE — Progress Notes (Signed)
   Subjective: 2 Days Post-Op Procedure(s) (LRB): OPEN REDUCTION INTERNAL FIXATION (ORIF) Left ankle lateral malleolus, possible deltoid ligament repair (Left) Patient reports pain as mild.   Patient seen in rounds for Dr. Doran Durand. Patient is well, and has had no acute complaints or problems. No acute events overnight. Voiding without difficulty. Patient was limited in her ability to ambulate with PT based on NWB restrictions, deconditioning, and bilateral LE neuropathy. She is awaiting bed at [facility].  We will continue therapy today.   Objective: Vital signs in last 24 hours: Temp:  [97.4 F (36.3 C)-98.1 F (36.7 C)] 97.6 F (36.4 C) (11/12 0806) Pulse Rate:  [61-76] 76 (11/12 0806) Resp:  [16-18] 18 (11/12 0806) BP: (110-137)/(56-71) 127/62 (11/12 0806) SpO2:  [94 %-96 %] 95 % (11/12 0806)  Intake/Output from previous day:  Intake/Output Summary (Last 24 hours) at 05/18/2021 0947 Last data filed at 05/18/2021 0600 Gross per 24 hour  Intake 240 ml  Output 2050 ml  Net -1810 ml     Intake/Output this shift: No intake/output data recorded.  Labs: Recent Labs    05/17/21 0841 05/18/21 0122  HGB 11.8* 11.6*   Recent Labs    05/17/21 0841 05/18/21 0122  WBC 18.5* 14.7*  RBC 3.96 3.85*  HCT 35.4* 35.1*  PLT 399 332   Recent Labs    05/17/21 0841 05/18/21 0122  NA 135 134*  K 3.1* 3.2*  CL 99 101  CO2 27 24  BUN 19 22  CREATININE 1.01* 1.04*  GLUCOSE 147* 115*  CALCIUM 8.7* 8.1*   No results for input(s): LABPT, INR in the last 72 hours.  Exam: General - Patient is Alert and Oriented Extremity - Neurologically intact Intact pulses distally Dressing - dressing C/D/I Motor Function - states she was unable to wiggle toes at baseline. Diminished sensation in the toes secondary to neuropathy. Good capillary refill. Toes warm and pink.   Past Medical History:  Diagnosis Date   Anxiety    Arthritis    "right foot; spine; hands" (11/23/2014)   Charcot  foot due to diabetes mellitus (HCC)    Depression    GERD (gastroesophageal reflux disease)    Gout    Hyperlipemia    Hypertension    Migraine    hx   Neuropathy    Numbness    Thyroid goiter 1986   Type II diabetes mellitus (HCC)     Assessment/Plan: 2 Days Post-Op Procedure(s) (LRB): OPEN REDUCTION INTERNAL FIXATION (ORIF) Left ankle lateral malleolus, possible deltoid ligament repair (Left) Principal Problem:   Closed low lateral malleolus fracture Active Problems:   Hyperlipemia   Hypertension   Type II diabetes mellitus ([place])  Estimated body mass index is 35.78 kg/m as calculated from the following:   Height as of this encounter: 5\' 5"  (1.651 m).   Weight as of this encounter: 97.5 kg.   NWB L LE Up with therapy DVT ppx:  Lovenox in house; transition to [place] upon D/C Disp:  SNF ([place]) when medically stable and bed available D/C scripts on chart. Plan for 2 week outpatient post-op visit.  Griffith Citron, PA-C Orthopedic Surgery [phone] 05/18/2021, 9:47 AM

## 2021-05-18 NOTE — Plan of Care (Signed)

## 2021-05-19 DIAGNOSIS — I10 Essential (primary) hypertension: Secondary | ICD-10-CM | POA: Diagnosis not present

## 2021-05-19 DIAGNOSIS — S93422A Sprain of deltoid ligament of left ankle, initial encounter: Secondary | ICD-10-CM | POA: Diagnosis not present

## 2021-05-19 DIAGNOSIS — E1165 Type 2 diabetes mellitus with hyperglycemia: Secondary | ICD-10-CM | POA: Diagnosis not present

## 2021-05-19 DIAGNOSIS — S8262XA Displaced fracture of lateral malleolus of left fibula, initial encounter for closed fracture: Secondary | ICD-10-CM | POA: Diagnosis not present

## 2021-05-19 LAB — CBC
HCT: 34.1 % — ABNORMAL LOW (ref 36.0–46.0)
Hemoglobin: 11.3 g/dL — ABNORMAL LOW (ref 12.0–15.0)
MCH: 30.2 pg (ref 26.0–34.0)
MCHC: 33.1 g/dL (ref 30.0–36.0)
MCV: 91.2 fL (ref 80.0–100.0)
Platelets: 317 10*3/uL (ref 150–400)
RBC: 3.74 MIL/uL — ABNORMAL LOW (ref 3.87–5.11)
RDW: 13.1 % (ref 11.5–15.5)
WBC: 10.6 10*3/uL — ABNORMAL HIGH (ref 4.0–10.5)
nRBC: 0 % (ref 0.0–0.2)

## 2021-05-19 LAB — RENAL FUNCTION PANEL
Albumin: 2.8 g/dL — ABNORMAL LOW (ref 3.5–5.0)
Anion gap: 8 (ref 5–15)
BUN: 22 mg/dL (ref 8–23)
CO2: 24 mmol/L (ref 22–32)
Calcium: 8.3 mg/dL — ABNORMAL LOW (ref 8.9–10.3)
Chloride: 105 mmol/L (ref 98–111)
Creatinine, Ser: 1.06 mg/dL — ABNORMAL HIGH (ref 0.44–1.00)
GFR, Estimated: 55 mL/min — ABNORMAL LOW (ref >60)
Glucose, Bld: 107 mg/dL — ABNORMAL HIGH (ref 70–99)
Phosphorus: 3.8 mg/dL (ref 2.5–4.6)
Potassium: 3.7 mmol/L (ref 3.5–5.1)
Sodium: 137 mmol/L (ref 135–145)

## 2021-05-19 LAB — GLUCOSE, CAPILLARY
Glucose-Capillary: 166 mg/dL — ABNORMAL HIGH (ref 70–99)
Glucose-Capillary: 101 mg/dL — ABNORMAL HIGH (ref 70–99)
Glucose-Capillary: 135 mg/dL — ABNORMAL HIGH (ref 70–99)
Glucose-Capillary: 170 mg/dL — ABNORMAL HIGH (ref 70–99)

## 2021-05-19 LAB — MAGNESIUM: Magnesium: 2 mg/dL (ref 1.7–2.4)

## 2021-05-19 MED ORDER — POTASSIUM CHLORIDE CRYS ER 20 MEQ PO TBCR
40.0000 meq | EXTENDED_RELEASE_TABLET | Freq: Once | ORAL | Status: AC
  Administered 2021-05-19: 40 meq via ORAL
  Filled 2021-05-19: qty 2

## 2021-05-19 MED ORDER — POTASSIUM CHLORIDE CRYS ER 10 MEQ PO TBCR: 20.0000 meq | EXTENDED_RELEASE_TABLET | Freq: Every day | ORAL | Status: AC

## 2021-05-19 NOTE — Progress Notes (Signed)
Mobility Specialist Criteria Algorithm Info.  Mobility Team:   05/19/21 1430  Mobility  Activity Transferred:  Bed to chair  Range of Motion/Exercises Passive;Left leg  Level of Assistance Minimal assist, patient does 75% or more (From elevated surface)  Assistive Device Stedy  LUE Weight Bearing NWB  LLE Weight Bearing TWB  Minutes Stood 1 minutes  Mobility Out of bed to chair with meals  Mobility Response Tolerated well  Mobility performed by Mobility specialist  Bed Position Chair      Patient received in bed eager to participate in mobility. Required min A to EOB and mod/min A to stand using stedy. More so min A to stand from elevated surface. Was able to properly demonstrate NWB on LLE. Tolerated transferring to chair well without complaint or incident and was in chair with all needs met.   05/19/2021 3:52 PM

## 2021-05-19 NOTE — Progress Notes (Signed)
PROGRESS NOTE  Pamela Snyder BEE:[id number] DOB: 07-Jul-1946   PCP: [name], PA-C  Patient is from: Home.  Lives with her husband who is currently hospitalized.  DOA: 05/16/2021 LOS: 3  Chief complaints:  No chief complaint on file.    Brief Narrative / Interim history: 75 year old F with history of DM-2 with neuropathy and Charcot foot, HTN, HLD, anxiety, depression and GERD admitted by orthopedic surgery for left ankle/malleolar fracture after accidental fall at home "while trying to help her daughter".  Underwent ORIF on 05/16/2021.  Hospitalist service consulted for medical management including hypokalemia and hyperglycemia.  Subjective: Seen and examined earlier this morning.  No major events overnight of this morning other than the usual difficulty sleeping at night.  Surgical pain fairly controlled.  No cardiac pain in all extremities but fairly controlled with Lyrica.  Denies chest pain, dyspnea, GI or UTI symptoms.  Objective: Vitals:   05/18/21 1600 05/18/21 2149 05/19/21 0508 05/19/21 0840  BP: 138/77 120/66 138/76 (!) 119/59  Pulse: 66 73 66 77  Resp: 18 16 18 18   Temp: 97.6 F (36.4 C) 97.8 F (36.6 C) 97.8 F (36.6 C) 97.6 F (36.4 C)  TempSrc: Axillary Oral Oral Oral  SpO2: 94% 98%  94%  Weight:      Height:        Intake/Output Summary (Last 24 hours) at 05/19/2021 1043 Last data filed at 05/19/2021 0840 Gross per 24 hour  Intake 600 ml  Output 2075 ml  Net -1475 ml   Filed Weights   05/16/21 0629  Weight: 97.5 kg    Examination: GENERAL: No apparent distress.  Nontoxic. HEENT: MMM.  Vision and hearing grossly intact.  NECK: Supple.  No apparent JVD.  RESP:  No IWOB.  Fair aeration bilaterally. CVS:  RRR. Heart sounds normal.  ABD/GI/GU: BS+. Abd soft, NTND.  MSK/EXT:  Moves extremities. No apparent deformity.  Bulky dressing over LLE. SKIN: no apparent skin lesion or wound NEURO: Awake and alert. Oriented appropriately.  No  apparent focal neuro deficit. PSYCH: Calm. Normal affect.   Procedures:  11/10-ORIF of left ankle lateral malleolus fracture/internal fixation of left ankle syndesmosis sprains/left deltoid ligament repair by Dr. Doran Durand  Microbiology summarized: None.  Assessment & Plan: Accidental fall at home: history of cervical myelopathy, peripheral neuropathy, foot drop and gait abnormality. Left lateral malleolar fracture/syndesmosis sprain -S/p ORIF of malleolar fracture and syndesmosis sprain, and left deltoid ligament repair -Management per ortho -Final disposition SNF.   Controlled NIDDM-2 with hyperglycemia, neuropathy and Charcot's foot: A1c 6.5%. Recent Labs  Lab 05/18/21 0741 05/18/21 1137 05/18/21 1649 05/18/21 2156 05/19/21 0738  GLUCAP 120* 126* 123* 159* 166*  -Continue current insulin regimen -Continue home metformin, Lyrica and Crestor on discharge.   Essential hypertension: Normotensive.  On Hyzaar, as needed p.o. Lasix and Toprol-XL at home.  I do not see history of cardiac disease in her chart. -Discontinued HCTZ due to persistent hypokalemia. -Continue losartan, Lasix and Toprol-XL.   Hypokalemia: Likely due to HCTZ.  Resolved. -Discontinued HCTZ on discharge -P.o. KCl 40x1 today, and p.o. KCl 10 mill equivalent daily on discharge-ordered.  Leukocytosis/bandemia: Suspect demargination.  Resolving with antibiotics.  History of dementia?  Fully oriented with good cognition on my exam   Morbid obesity with diabetes and hypertension Body mass index is 35.78 kg/m.  -Encourage lifestyle change to lose weight.       DVT prophylaxis:  enoxaparin (LOVENOX) injection 40 mg Start: 05/17/21 0800 SCDs Start: 05/16/21 1108  Code Status: Full code Family Communication: None at bedside. Level of care: Med-Surg Status is: Inpatient  Final disposition: Per primary.  Stable for discharge from medical standpoint.    Sch Meds:  Scheduled Meds:  allopurinol  100 mg Oral  BID   docusate sodium  100 mg Oral BID   enoxaparin (LOVENOX) injection  40 mg Subcutaneous Q24H   insulin aspart  0-20 Units Subcutaneous TID WC   losartan  50 mg Oral Daily   metoprolol succinate  100 mg Oral Daily   pantoprazole  40 mg Oral Daily   rosuvastatin  5 mg Oral Daily   senna  1 tablet Oral BID   Continuous Infusions:   PRN Meds:.acetaminophen, diphenhydrAMINE, furosemide, HYDROcodone-acetaminophen, HYDROcodone-acetaminophen, morphine injection, ondansetron **OR** ondansetron (ZOFRAN) IV, pregabalin, tiZANidine  Antimicrobials: Anti-infectives (From admission, onward)    Start     Dose/Rate Route Frequency Ordered Stop   05/16/21 0816  vancomycin (VANCOCIN) powder  Status:  Discontinued          As needed 05/16/21 0816 05/16/21 0905   05/16/21 0600  ceFAZolin (ANCEF) IVPB 2g/100 mL premix  Status:  Discontinued        2 g 200 mL/hr over 30 Minutes Intravenous On call to O.R. 05/16/21 0546 05/16/21 1055        I have personally reviewed the following labs and images: CBC: Recent Labs  Lab 05/14/21 1515 05/17/21 0841 05/18/21 0122 05/19/21 0117  WBC 15.8* 18.5* 14.7* 10.6*  HGB 13.4 11.8* 11.6* 11.3*  HCT 40.5 35.4* 35.1* 34.1*  MCV 89.6 89.4 91.2 91.2  PLT 392 399 332 317   BMP &GFR Recent Labs  Lab 05/14/21 1515 05/17/21 0841 05/18/21 0122 05/19/21 0117  NA 134* 135 134* 137  K 3.0* 3.1* 3.2* 3.7  CL 93* 99 101 105  CO2 29 27 24 24   GLUCOSE 110* 147* 115* 107*  BUN 16 19 22 22   CREATININE 1.11* 1.01* 1.04* 1.06*  CALCIUM 9.5 8.7* 8.1* 8.3*  MG  --  1.7 1.7 2.0  PHOS  --  3.3 3.2 3.8   Estimated Creatinine Clearance: 53.8 mL/min (A) (by C-G formula based on SCr of 1.06 mg/dL (H)). Liver & Pancreas: Recent Labs  Lab 05/17/21 0841 05/18/21 0122 05/19/21 0117  ALBUMIN 3.1* 2.9* 2.8*   No results for input(s): LIPASE, AMYLASE in the last 168 hours. No results for input(s): AMMONIA in the last 168 hours. Diabetic: No results for  input(s): HGBA1C in the last 72 hours.  Recent Labs  Lab 05/18/21 0741 05/18/21 1137 05/18/21 1649 05/18/21 2156 05/19/21 0738  GLUCAP 120* 126* 123* 159* 166*   Cardiac Enzymes: No results for input(s): CKTOTAL, CKMB, CKMBINDEX, TROPONINI in the last 168 hours. No results for input(s): PROBNP in the last 8760 hours. Coagulation Profile: No results for input(s): INR, PROTIME in the last 168 hours. Thyroid Function Tests: No results for input(s): TSH, T4TOTAL, FREET4, T3FREE, THYROIDAB in the last 72 hours. Lipid Profile: No results for input(s): CHOL, HDL, LDLCALC, TRIG, CHOLHDL, LDLDIRECT in the last 72 hours. Anemia Panel: No results for input(s): VITAMINB12, FOLATE, FERRITIN, TIBC, IRON, RETICCTPCT in the last 72 hours. Urine analysis: No results found for: COLORURINE, APPEARANCEUR, LABSPEC, PHURINE, GLUCOSEU, HGBUR, BILIRUBINUR, KETONESUR, PROTEINUR, UROBILINOGEN, NITRITE, LEUKOCYTESUR Sepsis Labs: Invalid input(s): PROCALCITONIN, [place]  Microbiology: Recent Results (from the past 240 hour(s))  Surgical pcr screen     Status: None   Collection Time: 05/14/21  2:46 PM   Specimen: Nasal Mucosa; Nasal Swab  Result Value Ref Range Status   MRSA, PCR NEGATIVE NEGATIVE Final   Staphylococcus aureus NEGATIVE NEGATIVE Final    Comment: (NOTE) The Xpert SA Assay (FDA approved for NASAL specimens in patients 30 years of age and older), is one component of a comprehensive surveillance program. It is not intended to diagnose infection nor to guide or monitor treatment. Performed at [facility], [place] [address], [place], [place] [address]   SARS CORONAVIRUS 2 (TAT 6-24 HRS) Nasopharyngeal Nasopharyngeal Swab     Status: None   Collection Time: 05/14/21  2:47 PM   Specimen: Nasopharyngeal Swab  Result Value Ref Range Status   SARS Coronavirus 2 NEGATIVE NEGATIVE Final    Comment: (NOTE) SARS-CoV-2 target nucleic acids are NOT DETECTED.  The SARS-CoV-2 RNA is  generally detectable in upper and lower respiratory specimens during the acute phase of infection. Negative results do not preclude SARS-CoV-2 infection, do not rule out co-infections with other pathogens, and should not be used as the sole basis for treatment or other patient management decisions. Negative results must be combined with clinical observations, patient history, and epidemiological information. The expected result is Negative.  Fact Sheet for Patients: SugarRoll.be  Fact Sheet for Healthcare Providers: https://www.woods-mathews.com/  This test is not yet approved or cleared by the [place] FDA and  has been authorized for detection and/or diagnosis of SARS-CoV-2 by FDA under an Emergency Use Authorization (EUA). This EUA will remain  in effect (meaning this test can be used) for the duration of the COVID-19 declaration under Se ction 564(b)(1) of the Act, 21 U.S.C. section 360bbb-3(b)(1), unless the authorization is terminated or revoked sooner.  Performed at [facility], [place] [address], [place], [place] [address]     Radiology Studies: No results found.    Trafton Roker T. Caldwell  If 7PM-7AM, please contact night-coverage [email or web address] 05/19/2021, 10:43 AM

## 2021-05-19 NOTE — Plan of Care (Signed)

## 2021-05-19 NOTE — Progress Notes (Signed)
   Subjective: 3 Days Post-Op Procedure(s) (LRB): OPEN REDUCTION INTERNAL FIXATION (ORIF) Left ankle lateral malleolus, possible deltoid ligament repair (Left) Patient reports pain as mild.   Patient seen in rounds for Dr. Doran Durand. Patient is well, and has had no acute complaints or problems. No acute events overnight. Voiding without difficulty. Patient was limited in her ability to ambulate with PT based on NWB restrictions, deconditioning, and bilateral LE neuropathy. She is awaiting bed at [facility].  Continue therapy today.   Objective: Vital signs in last 24 hours: Temp:  [97.6 F (36.4 C)-97.8 F (36.6 C)] 97.8 F (36.6 C) (11/13 0508) Pulse Rate:  [66-76] 66 (11/13 0508) Resp:  [16-18] 18 (11/13 0508) BP: (120-138)/(62-77) 138/76 (11/13 0508) SpO2:  [94 %-98 %] 98 % (11/12 2149)  Intake/Output from previous day:  Intake/Output Summary (Last 24 hours) at 05/19/2021 0804 Last data filed at 05/19/2021 0509 Gross per 24 hour  Intake 300 ml  Output 2075 ml  Net -1775 ml      Intake/Output this shift: No intake/output data recorded.  Labs: Recent Labs    05/17/21 0841 05/18/21 0122 05/19/21 0117  HGB 11.8* 11.6* 11.3*    Recent Labs    05/18/21 0122 05/19/21 0117  WBC 14.7* 10.6*  RBC 3.85* 3.74*  HCT 35.1* 34.1*  PLT 332 317    Recent Labs    05/18/21 0122 05/19/21 0117  NA 134* 137  K 3.2* 3.7  CL 101 105  CO2 24 24  BUN 22 22  CREATININE 1.04* 1.06*  GLUCOSE 115* 107*  CALCIUM 8.1* 8.3*    No results for input(s): LABPT, INR in the last 72 hours.  Exam: General - Patient is Alert and Oriented Extremity - Neurologically intact Intact pulses distally Dressing - dressing C/D/I Motor Function - states she was unable to wiggle toes at baseline. Diminished sensation in the toes secondary to neuropathy. Good capillary refill. Toes warm and pink.   Past Medical History:  Diagnosis Date   Anxiety    Arthritis    "right foot; spine; hands"  (11/23/2014)   Charcot foot due to diabetes mellitus (HCC)    Depression    GERD (gastroesophageal reflux disease)    Gout    Hyperlipemia    Hypertension    Migraine    hx   Neuropathy    Numbness    Thyroid goiter 1986   Type II diabetes mellitus (HCC)     Assessment/Plan: 3 Days Post-Op Procedure(s) (LRB): OPEN REDUCTION INTERNAL FIXATION (ORIF) Left ankle lateral malleolus, possible deltoid ligament repair (Left) Principal Problem:   Closed low lateral malleolus fracture Active Problems:   Hyperlipemia   Hypertension   Type II diabetes mellitus ([place])  Estimated body mass index is 35.78 kg/m as calculated from the following:   Height as of this encounter: 5\' 5"  (1.651 m).   Weight as of this encounter: 97.5 kg.   NWB L LE Up with therapy DVT ppx:  Lovenox in house; transition to [place] upon D/C Disp:  SNF ([facility]) when medically stable and bed available D/C scripts on chart. Plan for 2 week outpatient post-op visit.  Dorothyann Peng , PA-C Orthopedic Surgery  05/19/2021, 8:04 AM

## 2021-05-19 NOTE — Progress Notes (Signed)
  Mobility Specialist Criteria Algorithm Info.    05/19/21 1703  Mobility  Activity Transferred:  Chair to bed  Range of Motion/Exercises Active;All extremities  Level of Assistance Moderate assist, patient does 50-74%  Assistive Device  (Squat Pivot /gait belt)  LUE Weight Bearing NWB  LLE Weight Bearing NWB  Mobility Sit up in bed/chair position for meals  Mobility Response Tolerated well  Mobility performed by Mobility specialist  Bed Position Semi-fowlers  $Mobility charge 1 Mobility    05/19/2021 5:04 PM

## 2021-05-20 DIAGNOSIS — U071 COVID-19: Secondary | ICD-10-CM | POA: Diagnosis not present

## 2021-05-20 DIAGNOSIS — M5136 Other intervertebral disc degeneration, lumbar region: Secondary | ICD-10-CM | POA: Diagnosis not present

## 2021-05-20 DIAGNOSIS — M2550 Pain in unspecified joint: Secondary | ICD-10-CM | POA: Diagnosis not present

## 2021-05-20 DIAGNOSIS — S82892D Other fracture of left lower leg, subsequent encounter for closed fracture with routine healing: Secondary | ICD-10-CM | POA: Diagnosis not present

## 2021-05-20 DIAGNOSIS — F430 Acute stress reaction: Secondary | ICD-10-CM | POA: Diagnosis not present

## 2021-05-20 DIAGNOSIS — E1165 Type 2 diabetes mellitus with hyperglycemia: Secondary | ICD-10-CM | POA: Diagnosis not present

## 2021-05-20 DIAGNOSIS — F411 Generalized anxiety disorder: Secondary | ICD-10-CM | POA: Diagnosis not present

## 2021-05-20 DIAGNOSIS — S82892S Other fracture of left lower leg, sequela: Secondary | ICD-10-CM | POA: Diagnosis not present

## 2021-05-20 DIAGNOSIS — I152 Hypertension secondary to endocrine disorders: Secondary | ICD-10-CM | POA: Diagnosis not present

## 2021-05-20 DIAGNOSIS — R0602 Shortness of breath: Secondary | ICD-10-CM | POA: Diagnosis not present

## 2021-05-20 DIAGNOSIS — F4322 Adjustment disorder with anxiety: Secondary | ICD-10-CM | POA: Diagnosis not present

## 2021-05-20 DIAGNOSIS — Z23 Encounter for immunization: Secondary | ICD-10-CM | POA: Diagnosis not present

## 2021-05-20 DIAGNOSIS — R262 Difficulty in walking, not elsewhere classified: Secondary | ICD-10-CM | POA: Diagnosis not present

## 2021-05-20 DIAGNOSIS — F5101 Primary insomnia: Secondary | ICD-10-CM | POA: Diagnosis not present

## 2021-05-20 DIAGNOSIS — I10 Essential (primary) hypertension: Secondary | ICD-10-CM | POA: Diagnosis not present

## 2021-05-20 DIAGNOSIS — M5416 Radiculopathy, lumbar region: Secondary | ICD-10-CM | POA: Diagnosis not present

## 2021-05-20 DIAGNOSIS — Z0189 Encounter for other specified special examinations: Secondary | ICD-10-CM | POA: Diagnosis not present

## 2021-05-20 DIAGNOSIS — R269 Unspecified abnormalities of gait and mobility: Secondary | ICD-10-CM | POA: Diagnosis not present

## 2021-05-20 DIAGNOSIS — M810 Age-related osteoporosis without current pathological fracture: Secondary | ICD-10-CM | POA: Diagnosis not present

## 2021-05-20 DIAGNOSIS — G63 Polyneuropathy in diseases classified elsewhere: Secondary | ICD-10-CM | POA: Diagnosis not present

## 2021-05-20 DIAGNOSIS — S8292XA Unspecified fracture of left lower leg, initial encounter for closed fracture: Secondary | ICD-10-CM | POA: Diagnosis not present

## 2021-05-20 DIAGNOSIS — R601 Generalized edema: Secondary | ICD-10-CM | POA: Diagnosis not present

## 2021-05-20 DIAGNOSIS — E559 Vitamin D deficiency, unspecified: Secondary | ICD-10-CM | POA: Diagnosis not present

## 2021-05-20 DIAGNOSIS — G4720 Circadian rhythm sleep disorder, unspecified type: Secondary | ICD-10-CM | POA: Diagnosis not present

## 2021-05-20 DIAGNOSIS — F4321 Adjustment disorder with depressed mood: Secondary | ICD-10-CM | POA: Diagnosis not present

## 2021-05-20 DIAGNOSIS — S93422A Sprain of deltoid ligament of left ankle, initial encounter: Secondary | ICD-10-CM | POA: Diagnosis not present

## 2021-05-20 DIAGNOSIS — M6281 Muscle weakness (generalized): Secondary | ICD-10-CM | POA: Diagnosis not present

## 2021-05-20 DIAGNOSIS — H579 Unspecified disorder of eye and adnexa: Secondary | ICD-10-CM | POA: Diagnosis not present

## 2021-05-20 DIAGNOSIS — Z4889 Encounter for other specified surgical aftercare: Secondary | ICD-10-CM | POA: Diagnosis not present

## 2021-05-20 DIAGNOSIS — S8262XA Displaced fracture of lateral malleolus of left fibula, initial encounter for closed fracture: Secondary | ICD-10-CM | POA: Diagnosis not present

## 2021-05-20 DIAGNOSIS — N182 Chronic kidney disease, stage 2 (mild): Secondary | ICD-10-CM | POA: Diagnosis not present

## 2021-05-20 DIAGNOSIS — R600 Localized edema: Secondary | ICD-10-CM | POA: Diagnosis not present

## 2021-05-20 DIAGNOSIS — F4320 Adjustment disorder, unspecified: Secondary | ICD-10-CM | POA: Diagnosis not present

## 2021-05-20 DIAGNOSIS — Z7401 Bed confinement status: Secondary | ICD-10-CM | POA: Diagnosis not present

## 2021-05-20 DIAGNOSIS — M109 Gout, unspecified: Secondary | ICD-10-CM | POA: Diagnosis not present

## 2021-05-20 DIAGNOSIS — S8292XS Unspecified fracture of left lower leg, sequela: Secondary | ICD-10-CM | POA: Diagnosis not present

## 2021-05-20 DIAGNOSIS — E1122 Type 2 diabetes mellitus with diabetic chronic kidney disease: Secondary | ICD-10-CM | POA: Diagnosis not present

## 2021-05-20 DIAGNOSIS — M21371 Foot drop, right foot: Secondary | ICD-10-CM | POA: Diagnosis not present

## 2021-05-20 DIAGNOSIS — E1142 Type 2 diabetes mellitus with diabetic polyneuropathy: Secondary | ICD-10-CM | POA: Diagnosis not present

## 2021-05-20 DIAGNOSIS — E1159 Type 2 diabetes mellitus with other circulatory complications: Secondary | ICD-10-CM | POA: Diagnosis not present

## 2021-05-20 DIAGNOSIS — E870 Hyperosmolality and hypernatremia: Secondary | ICD-10-CM | POA: Diagnosis not present

## 2021-05-20 DIAGNOSIS — M48061 Spinal stenosis, lumbar region without neurogenic claudication: Secondary | ICD-10-CM | POA: Diagnosis not present

## 2021-05-20 DIAGNOSIS — G8929 Other chronic pain: Secondary | ICD-10-CM | POA: Diagnosis not present

## 2021-05-20 DIAGNOSIS — M792 Neuralgia and neuritis, unspecified: Secondary | ICD-10-CM | POA: Diagnosis not present

## 2021-05-20 DIAGNOSIS — E118 Type 2 diabetes mellitus with unspecified complications: Secondary | ICD-10-CM | POA: Diagnosis not present

## 2021-05-20 DIAGNOSIS — I872 Venous insufficiency (chronic) (peripheral): Secondary | ICD-10-CM | POA: Diagnosis not present

## 2021-05-20 DIAGNOSIS — D72829 Elevated white blood cell count, unspecified: Secondary | ICD-10-CM | POA: Diagnosis not present

## 2021-05-20 DIAGNOSIS — R195 Other fecal abnormalities: Secondary | ICD-10-CM | POA: Diagnosis not present

## 2021-05-20 DIAGNOSIS — S82892A Other fracture of left lower leg, initial encounter for closed fracture: Secondary | ICD-10-CM | POA: Diagnosis not present

## 2021-05-20 DIAGNOSIS — Z1331 Encounter for screening for depression: Secondary | ICD-10-CM | POA: Diagnosis not present

## 2021-05-20 DIAGNOSIS — E876 Hypokalemia: Secondary | ICD-10-CM | POA: Diagnosis not present

## 2021-05-20 DIAGNOSIS — F410 Panic disorder [episodic paroxysmal anxiety] without agoraphobia: Secondary | ICD-10-CM | POA: Diagnosis not present

## 2021-05-20 DIAGNOSIS — R0600 Dyspnea, unspecified: Secondary | ICD-10-CM | POA: Diagnosis not present

## 2021-05-20 LAB — GLUCOSE, CAPILLARY
Glucose-Capillary: 174 mg/dL — ABNORMAL HIGH (ref 70–99)
Glucose-Capillary: 128 mg/dL — ABNORMAL HIGH (ref 70–99)

## 2021-05-20 LAB — SARS CORONAVIRUS 2 (TAT 6-24 HRS): SARS Coronavirus 2: NEGATIVE

## 2021-05-20 NOTE — Progress Notes (Signed)
Subjective: 4 Days Post-Op Procedure(s) (LRB): OPEN REDUCTION INTERNAL FIXATION (ORIF) Left ankle lateral malleolus, possible deltoid ligament repair (Left)  Patient reports pain as mild to moderate.  Tolerating POs well. Admits to flatus.  Denies fever, chills, N/V, CP, or SOB.  Reports that she has worked well with therapy and is planning to be D/C'd to [facility] today.  Objective:   VITALS:  Temp:  [97.6 F (36.4 C)-98.6 F (37 C)] 98.6 F (37 C) (11/14 0610) Pulse Rate:  [70-77] 72 (11/14 0610) Resp:  [18-19] 19 (11/14 0610) BP: (115-144)/(57-69) 143/57 (11/14 0610) SpO2:  [92 %-97 %] 96 % (11/14 0610) Weight:  [101.3 kg] 101.3 kg (11/14 0400)  General: WDWN patient in NAD. Psych:  Appropriate mood and affect. Neuro:  A&O x 3, Moving all extremities, sensation subjectively diminished to light touch due to underlying PN. HEENT:  EOMs intact Chest:  Even non-labored respirations Skin:  SLS C/D/I, no rashes or lesions Extremities: warm/dry, no visible edema, erythema or echymosis.  No lymphadenopathy. Pulses: Popliteus 2+ MSK:  ROM: EHL/FHL intact, MMT: able to perform quad set   LABS Recent Labs    05/17/21 0841 05/18/21 0122 05/19/21 0117  HGB 11.8* 11.6* 11.3*  WBC 18.5* 14.7* 10.6*  PLT 399 332 317   Recent Labs    05/18/21 0122 05/19/21 0117  NA 134* 137  K 3.2* 3.7  CL 101 105  CO2 24 24  BUN 22 22  CREATININE 1.04* 1.06*  GLUCOSE 115* 107*   No results for input(s): LABPT, INR in the last 72 hours.   Assessment/Plan: 4 Days Post-Op Procedure(s) (LRB): OPEN REDUCTION INTERNAL FIXATION (ORIF) Left ankle lateral malleolus, possible deltoid ligament repair (Left)  NWB L LE D/C order to SNF is placed.  If for some reason the patient can't go today please inform me and I'll cancel the order. D/C scripts on chart. Plan for 2 week outpatient post-op visit.  Mechele Claude PA-C [facility] Office:  [phone]

## 2021-05-20 NOTE — Progress Notes (Signed)
RN called [facility] and gave report to [place], Therapist, sports and she stated understanding. IV has been removed and the patient is waiting for PTAR

## 2021-05-20 NOTE — TOC Transition Note (Addendum)
Transition of Care [facility]) - CM/SW Discharge Note   Patient Details  Name: Pamela Snyder MRN: [id number] Date of Birth: [birth year]  Transition of Care [facility]) CM/SW Contact:  Emeterio Reeve, LCSW Phone Number: 05/20/2021, 10:54 AM   Clinical Narrative:       Per MD patient ready for DC to [facility]. RN, patient, patient's family, and facility notified of DC. Discharge Summary and FL2 sent to facility. DC packet on chart. Insurance Josem Kaufmann has been received and pt is covid test is pending, admissions coordinator is aware and starts pt can still come. Ambulance transport requested for patient.    RN to call report to [phone].  CSW will sign off for now as social work intervention is no longer needed. Please consult [place] again if new needs arise.   Final next level of care: Skilled Nursing Facility Barriers to Discharge: Barriers Resolved   Patient Goals and CMS Choice Patient states their goals for this hospitalization and ongoing recovery are:: to recover at [facility] [address] CMS Medicare.gov Compare Post Acute Care list provided to:: Patient Choice offered to / list presented to : Patient  Discharge Placement              Patient chooses bed at: [facility] Patient to be transferred to facility by: ptar Name of family member notified: pt Ox4 Patient and family notified of of transfer: 05/20/21  Discharge Plan and Services                                     Social Determinants of Health (SDOH) Interventions     Readmission Risk Interventions No flowsheet data found.   Emeterio Reeve, LCSW Clinical Social Worker

## 2021-05-20 NOTE — Progress Notes (Signed)
PROGRESS NOTE  Pamela Snyder MGQ:[id number] DOB: October 09, 1945   PCP: [name], PA-C  Patient is from: Home.  Lives with her husband who is currently hospitalized.  DOA: 05/16/2021 LOS: 4  Chief complaints:  No chief complaint on file.    Brief Narrative / Interim history: 75 year old F with history of DM-2 with neuropathy and Charcot foot, HTN, HLD, anxiety, depression and GERD admitted by orthopedic surgery for left ankle/malleolar fracture after accidental fall at home "while trying to help her daughter".  Underwent ORIF on 05/16/2021.  Hospitalist service consulted for medical management including hypokalemia and hyperglycemia.  Hypokalemia after discontinuing HCTZ and replacement.  CBG within acceptable range.  Medically stable for discharge to SNF.  Discharge medication reconciled as deemed appropriate.  Subjective: Seen and examined earlier this morning.  No major events overnight of this morning.  No complaints.   Objective: Vitals:   05/19/21 2037 05/20/21 0400 05/20/21 0610 05/20/21 0733  BP: (!) 115/58  (!) 143/57 (!) 133/59  Pulse: 73  72 87  Resp: 18  19   Temp: 98.3 F (36.8 C)  98.6 F (37 C) 98.4 F (36.9 C)  TempSrc: Oral  Oral Oral  SpO2: 92%  96% 93%  Weight:  101.3 kg    Height:        Intake/Output Summary (Last 24 hours) at 05/20/2021 1148 Last data filed at 05/20/2021 0614 Gross per 24 hour  Intake 300 ml  Output 1475 ml  Net -1175 ml   Filed Weights   05/16/21 0629 05/20/21 0400  Weight: 97.5 kg 101.3 kg    Examination:  GENERAL: No apparent distress.  Nontoxic. HEENT: MMM.  Vision and hearing grossly intact.  NECK: Supple.  No apparent JVD.  RESP:  No IWOB.  Fair aeration bilaterally. CVS:  RRR. Heart sounds normal.  ABD/GI/GU: BS+. Abd soft, NTND.  MSK/EXT:  Moves extremities. No apparent deformity.  Bulky dressing over LLE. SKIN: Bulky dressing over LLE.  Pink and normal skin color in her toes. NEURO: Awake and alert.  Oriented appropriately.  No apparent focal neuro deficit. PSYCH: Calm. Normal affect.   Procedures:  11/10-ORIF of left ankle lateral malleolus fracture/internal fixation of left ankle syndesmosis sprains/left deltoid ligament repair by Dr. Doran Durand  Microbiology summarized: None.  Assessment & Plan: Accidental fall at home: history of cervical myelopathy, peripheral neuropathy, foot drop and gait abnormality. Left lateral malleolar fracture/syndesmosis sprain -S/p ORIF of malleolar fracture and syndesmosis sprain, and left deltoid ligament repair -Management per ortho -Final disposition SNF.   Controlled NIDDM-2 with hyperglycemia, neuropathy and Charcot's foot: A1c 6.5%. Recent Labs  Lab 05/19/21 1147 05/19/21 1741 05/19/21 2051 05/20/21 0757 05/20/21 1125  GLUCAP 101* 135* 170* 174* 128*  -Continue current insulin regimen -Continue home metformin, Lyrica and Crestor on discharge.   Essential hypertension: Normotensive.  On Hyzaar, as needed p.o. Lasix and Toprol-XL at home.  I do not see history of cardiac disease in her chart. -Discontinued HCTZ due to persistent hypokalemia. -Continue losartan, Lasix and Toprol-XL.   Hypokalemia: Likely due to HCTZ.  Resolved. -Discontinued HCTZ on discharge -P.o. KCl 40x1 today, and p.o. KCl 10 mill equivalent daily on discharge-ordered.  Leukocytosis/bandemia: Suspect demargination.  Resolving with antibiotics.  History of dementia?  Fully oriented with good cognition on my exam.    Morbid obesity with diabetes and hypertension Body mass index is 37.16 kg/m.  -Encourage lifestyle change to lose weight.       DVT prophylaxis:  enoxaparin (LOVENOX) injection 40  mg Start: 05/17/21 0800 SCDs Start: 05/16/21 1108  Code Status: Full code Family Communication: None at bedside. Level of care: Med-Surg Status is: Inpatient  Final disposition: Per primary.  Stable for discharge from medical standpoint.  Med reconciled as  appropriate.   Sch Meds:  Scheduled Meds:  allopurinol  100 mg Oral BID   docusate sodium  100 mg Oral BID   enoxaparin (LOVENOX) injection  40 mg Subcutaneous Q24H   insulin aspart  0-20 Units Subcutaneous TID WC   losartan  50 mg Oral Daily   metoprolol succinate  100 mg Oral Daily   pantoprazole  40 mg Oral Daily   rosuvastatin  5 mg Oral Daily   senna  1 tablet Oral BID   Continuous Infusions:   PRN Meds:.acetaminophen, diphenhydrAMINE, furosemide, HYDROcodone-acetaminophen, HYDROcodone-acetaminophen, morphine injection, ondansetron **OR** ondansetron (ZOFRAN) IV, pregabalin, tiZANidine  Antimicrobials: Anti-infectives (From admission, onward)    Start     Dose/Rate Route Frequency Ordered Stop   05/16/21 0816  vancomycin (VANCOCIN) powder  Status:  Discontinued          As needed 05/16/21 0816 05/16/21 0905   05/16/21 0600  ceFAZolin (ANCEF) IVPB 2g/100 mL premix  Status:  Discontinued        2 g 200 mL/hr over 30 Minutes Intravenous On call to O.R. 05/16/21 0546 05/16/21 1055        I have personally reviewed the following labs and images: CBC: Recent Labs  Lab 05/14/21 1515 05/17/21 0841 05/18/21 0122 05/19/21 0117  WBC 15.8* 18.5* 14.7* 10.6*  HGB 13.4 11.8* 11.6* 11.3*  HCT 40.5 35.4* 35.1* 34.1*  MCV 89.6 89.4 91.2 91.2  PLT 392 399 332 317   BMP &GFR Recent Labs  Lab 05/14/21 1515 05/17/21 0841 05/18/21 0122 05/19/21 0117  NA 134* 135 134* 137  K 3.0* 3.1* 3.2* 3.7  CL 93* 99 101 105  CO2 29 27 24 24   GLUCOSE 110* 147* 115* 107*  BUN 16 19 22 22   CREATININE 1.11* 1.01* 1.04* 1.06*  CALCIUM 9.5 8.7* 8.1* 8.3*  MG  --  1.7 1.7 2.0  PHOS  --  3.3 3.2 3.8   Estimated Creatinine Clearance: 54.9 mL/min (A) (by C-G formula based on SCr of 1.06 mg/dL (H)). Liver & Pancreas: Recent Labs  Lab 05/17/21 0841 05/18/21 0122 05/19/21 0117  ALBUMIN 3.1* 2.9* 2.8*   No results for input(s): LIPASE, AMYLASE in the last 168 hours. No results for  input(s): AMMONIA in the last 168 hours. Diabetic: No results for input(s): HGBA1C in the last 72 hours.  Recent Labs  Lab 05/19/21 1147 05/19/21 1741 05/19/21 2051 05/20/21 0757 05/20/21 1125  GLUCAP 101* 135* 170* 174* 128*   Cardiac Enzymes: No results for input(s): CKTOTAL, CKMB, CKMBINDEX, TROPONINI in the last 168 hours. No results for input(s): PROBNP in the last 8760 hours. Coagulation Profile: No results for input(s): INR, PROTIME in the last 168 hours. Thyroid Function Tests: No results for input(s): TSH, T4TOTAL, FREET4, T3FREE, THYROIDAB in the last 72 hours. Lipid Profile: No results for input(s): CHOL, HDL, LDLCALC, TRIG, CHOLHDL, LDLDIRECT in the last 72 hours. Anemia Panel: No results for input(s): VITAMINB12, FOLATE, FERRITIN, TIBC, IRON, RETICCTPCT in the last 72 hours. Urine analysis: No results found for: COLORURINE, APPEARANCEUR, LABSPEC, PHURINE, GLUCOSEU, HGBUR, BILIRUBINUR, KETONESUR, PROTEINUR, UROBILINOGEN, NITRITE, LEUKOCYTESUR Sepsis Labs: Invalid input(s): PROCALCITONIN, [place]  Microbiology: Recent Results (from the past 240 hour(s))  Surgical pcr screen     Status: None  Collection Time: 05/14/21  2:46 PM   Specimen: Nasal Mucosa; Nasal Swab  Result Value Ref Range Status   MRSA, PCR NEGATIVE NEGATIVE Final   Staphylococcus aureus NEGATIVE NEGATIVE Final    Comment: (NOTE) The Xpert SA Assay (FDA approved for NASAL specimens in patients 62 years of age and older), is one component of a comprehensive surveillance program. It is not intended to diagnose infection nor to guide or monitor treatment. Performed at [facility], [place] [address], [place], [place] [address]   SARS CORONAVIRUS 2 (TAT 6-24 HRS) Nasopharyngeal Nasopharyngeal Swab     Status: None   Collection Time: 05/14/21  2:47 PM   Specimen: Nasopharyngeal Swab  Result Value Ref Range Status   SARS Coronavirus 2 NEGATIVE NEGATIVE Final    Comment: (NOTE) SARS-CoV-2  target nucleic acids are NOT DETECTED.  The SARS-CoV-2 RNA is generally detectable in upper and lower respiratory specimens during the acute phase of infection. Negative results do not preclude SARS-CoV-2 infection, do not rule out co-infections with other pathogens, and should not be used as the sole basis for treatment or other patient management decisions. Negative results must be combined with clinical observations, patient history, and epidemiological information. The expected result is Negative.  Fact Sheet for Patients: SugarRoll.be  Fact Sheet for Healthcare Providers: https://www.woods-mathews.com/  This test is not yet approved or cleared by the [place] FDA and  has been authorized for detection and/or diagnosis of SARS-CoV-2 by FDA under an Emergency Use Authorization (EUA). This EUA will remain  in effect (meaning this test can be used) for the duration of the COVID-19 declaration under Se ction 564(b)(1) of the Act, 21 U.S.C. section 360bbb-3(b)(1), unless the authorization is terminated or revoked sooner.  Performed at [facility], [place] [address] [place], [place] [address]     Radiology Studies: No results found.    Lyric Rossano T. Valencia  If 7PM-7AM, please contact night-coverage [email or web address] 05/20/2021, 11:48 AM

## 2021-05-20 NOTE — Progress Notes (Signed)
  Mobility Specialist Criteria Algorithm Info.  Mobility Team: HOB elevated: Activity: Dangled on edge of bed; Sat and stood x 3 (LE exercises; declined tx/chair due to getting discharged) Range of motion: Active; All extremities Level of assistance: Minimal assist, patient does 75% or more (from elevated surface) Assistive device: Front wheel walker Minutes stood: 1 minutes Mobility response: Tolerated well Bed Position: Semi-fowlers  Patient received in bed willing to participate in mobility but declined transferring to chair. Agreed to practice NWB on LLE with standing and LE exercises. Required min/mod A to EOB. Patient stood x3 with min A from EOB + cues for safe hand placement on RW. Tolerated standing EOB and LE exercises well. Was left lying supine in bed with all needs met.   05/20/2021 11:50 AM

## 2021-05-20 NOTE — Care Management Important Message (Signed)
Important Message  Patient Details  Name: Pamela Snyder MRN: [id number] Date of Birth: April 01, 1946   Medicare Important Message Given:  Yes     Taylore Hinde Montine Circle 05/20/2021, 4:42 PM

## 2021-05-21 DIAGNOSIS — S82892A Other fracture of left lower leg, initial encounter for closed fracture: Secondary | ICD-10-CM | POA: Diagnosis not present

## 2021-05-21 DIAGNOSIS — E118 Type 2 diabetes mellitus with unspecified complications: Secondary | ICD-10-CM | POA: Diagnosis not present

## 2021-05-21 DIAGNOSIS — Z23 Encounter for immunization: Secondary | ICD-10-CM | POA: Diagnosis not present

## 2021-05-21 DIAGNOSIS — M5136 Other intervertebral disc degeneration, lumbar region: Secondary | ICD-10-CM | POA: Diagnosis not present

## 2021-05-21 DIAGNOSIS — G4720 Circadian rhythm sleep disorder, unspecified type: Secondary | ICD-10-CM | POA: Diagnosis not present

## 2021-05-21 DIAGNOSIS — E1159 Type 2 diabetes mellitus with other circulatory complications: Secondary | ICD-10-CM | POA: Diagnosis not present

## 2021-05-21 DIAGNOSIS — H579 Unspecified disorder of eye and adnexa: Secondary | ICD-10-CM | POA: Diagnosis not present

## 2021-05-21 DIAGNOSIS — I872 Venous insufficiency (chronic) (peripheral): Secondary | ICD-10-CM | POA: Diagnosis not present

## 2021-05-21 DIAGNOSIS — R269 Unspecified abnormalities of gait and mobility: Secondary | ICD-10-CM | POA: Diagnosis not present

## 2021-05-21 DIAGNOSIS — M792 Neuralgia and neuritis, unspecified: Secondary | ICD-10-CM | POA: Diagnosis not present

## 2021-05-21 DIAGNOSIS — I152 Hypertension secondary to endocrine disorders: Secondary | ICD-10-CM | POA: Diagnosis not present

## 2021-05-23 DIAGNOSIS — H579 Unspecified disorder of eye and adnexa: Secondary | ICD-10-CM | POA: Diagnosis not present

## 2021-05-23 DIAGNOSIS — R269 Unspecified abnormalities of gait and mobility: Secondary | ICD-10-CM | POA: Diagnosis not present

## 2021-05-29 DIAGNOSIS — I152 Hypertension secondary to endocrine disorders: Secondary | ICD-10-CM | POA: Diagnosis not present

## 2021-05-29 DIAGNOSIS — S8262XA Displaced fracture of lateral malleolus of left fibula, initial encounter for closed fracture: Secondary | ICD-10-CM | POA: Diagnosis not present

## 2021-05-29 DIAGNOSIS — S82892D Other fracture of left lower leg, subsequent encounter for closed fracture with routine healing: Secondary | ICD-10-CM | POA: Diagnosis not present

## 2021-05-29 DIAGNOSIS — M792 Neuralgia and neuritis, unspecified: Secondary | ICD-10-CM | POA: Diagnosis not present

## 2021-05-29 DIAGNOSIS — E118 Type 2 diabetes mellitus with unspecified complications: Secondary | ICD-10-CM | POA: Diagnosis not present

## 2021-05-29 DIAGNOSIS — Z4889 Encounter for other specified surgical aftercare: Secondary | ICD-10-CM | POA: Diagnosis not present

## 2021-06-06 ENCOUNTER — Other Ambulatory Visit: Payer: Self-pay | Admitting: *Deleted

## 2021-06-06 DIAGNOSIS — E118 Type 2 diabetes mellitus with unspecified complications: Secondary | ICD-10-CM | POA: Diagnosis not present

## 2021-06-06 DIAGNOSIS — R195 Other fecal abnormalities: Secondary | ICD-10-CM | POA: Diagnosis not present

## 2021-06-06 DIAGNOSIS — I10 Essential (primary) hypertension: Secondary | ICD-10-CM | POA: Diagnosis not present

## 2021-06-06 DIAGNOSIS — S8292XA Unspecified fracture of left lower leg, initial encounter for closed fracture: Secondary | ICD-10-CM | POA: Diagnosis not present

## 2021-06-06 DIAGNOSIS — R262 Difficulty in walking, not elsewhere classified: Secondary | ICD-10-CM | POA: Diagnosis not present

## 2021-06-06 DIAGNOSIS — S82892D Other fracture of left lower leg, subsequent encounter for closed fracture with routine healing: Secondary | ICD-10-CM | POA: Diagnosis not present

## 2021-06-06 DIAGNOSIS — R600 Localized edema: Secondary | ICD-10-CM | POA: Diagnosis not present

## 2021-06-06 DIAGNOSIS — R0602 Shortness of breath: Secondary | ICD-10-CM | POA: Diagnosis not present

## 2021-06-06 DIAGNOSIS — R0600 Dyspnea, unspecified: Secondary | ICD-10-CM | POA: Diagnosis not present

## 2021-06-06 DIAGNOSIS — F411 Generalized anxiety disorder: Secondary | ICD-10-CM | POA: Diagnosis not present

## 2021-06-06 DIAGNOSIS — M792 Neuralgia and neuritis, unspecified: Secondary | ICD-10-CM | POA: Diagnosis not present

## 2021-06-06 DIAGNOSIS — F4322 Adjustment disorder with anxiety: Secondary | ICD-10-CM | POA: Diagnosis not present

## 2021-06-06 DIAGNOSIS — E1159 Type 2 diabetes mellitus with other circulatory complications: Secondary | ICD-10-CM | POA: Diagnosis not present

## 2021-06-06 DIAGNOSIS — F4321 Adjustment disorder with depressed mood: Secondary | ICD-10-CM | POA: Diagnosis not present

## 2021-06-06 DIAGNOSIS — E876 Hypokalemia: Secondary | ICD-10-CM | POA: Diagnosis not present

## 2021-06-06 DIAGNOSIS — F5101 Primary insomnia: Secondary | ICD-10-CM | POA: Diagnosis not present

## 2021-06-06 DIAGNOSIS — R601 Generalized edema: Secondary | ICD-10-CM | POA: Diagnosis not present

## 2021-06-06 DIAGNOSIS — S8292XS Unspecified fracture of left lower leg, sequela: Secondary | ICD-10-CM | POA: Diagnosis not present

## 2021-06-06 DIAGNOSIS — I152 Hypertension secondary to endocrine disorders: Secondary | ICD-10-CM | POA: Diagnosis not present

## 2021-06-06 DIAGNOSIS — D72829 Elevated white blood cell count, unspecified: Secondary | ICD-10-CM | POA: Diagnosis not present

## 2021-06-06 DIAGNOSIS — F410 Panic disorder [episodic paroxysmal anxiety] without agoraphobia: Secondary | ICD-10-CM | POA: Diagnosis not present

## 2021-06-06 DIAGNOSIS — M6281 Muscle weakness (generalized): Secondary | ICD-10-CM | POA: Diagnosis not present

## 2021-06-06 DIAGNOSIS — E870 Hyperosmolality and hypernatremia: Secondary | ICD-10-CM | POA: Diagnosis not present

## 2021-06-06 DIAGNOSIS — F430 Acute stress reaction: Secondary | ICD-10-CM | POA: Diagnosis not present

## 2021-06-06 DIAGNOSIS — S8262XA Displaced fracture of lateral malleolus of left fibula, initial encounter for closed fracture: Secondary | ICD-10-CM | POA: Diagnosis not present

## 2021-06-06 NOTE — Patient Outreach (Signed)
Member screened for potential [facility] Care Management needs. Per [facility] Mrs. Goller resides in [facility].   Communication sent to facility SW to inquire about transition plans.   Will continue to follow.  Marthenia Rolling, MSN, RN,BSN [place] Acute Care Coordinator [phone] [facility]) [phone]  (Toll free office)

## 2021-06-12 DIAGNOSIS — M5136 Other intervertebral disc degeneration, lumbar region: Secondary | ICD-10-CM | POA: Diagnosis not present

## 2021-06-12 DIAGNOSIS — H579 Unspecified disorder of eye and adnexa: Secondary | ICD-10-CM | POA: Diagnosis not present

## 2021-06-12 DIAGNOSIS — Z1331 Encounter for screening for depression: Secondary | ICD-10-CM | POA: Diagnosis not present

## 2021-06-12 DIAGNOSIS — E118 Type 2 diabetes mellitus with unspecified complications: Secondary | ICD-10-CM | POA: Diagnosis not present

## 2021-06-12 DIAGNOSIS — M792 Neuralgia and neuritis, unspecified: Secondary | ICD-10-CM | POA: Diagnosis not present

## 2021-06-12 DIAGNOSIS — S82892D Other fracture of left lower leg, subsequent encounter for closed fracture with routine healing: Secondary | ICD-10-CM | POA: Diagnosis not present

## 2021-06-17 DIAGNOSIS — F430 Acute stress reaction: Secondary | ICD-10-CM | POA: Diagnosis not present

## 2021-06-17 DIAGNOSIS — E870 Hyperosmolality and hypernatremia: Secondary | ICD-10-CM | POA: Diagnosis not present

## 2021-06-17 DIAGNOSIS — E876 Hypokalemia: Secondary | ICD-10-CM | POA: Diagnosis not present

## 2021-06-17 DIAGNOSIS — S82892D Other fracture of left lower leg, subsequent encounter for closed fracture with routine healing: Secondary | ICD-10-CM | POA: Diagnosis not present

## 2021-06-17 DIAGNOSIS — E118 Type 2 diabetes mellitus with unspecified complications: Secondary | ICD-10-CM | POA: Diagnosis not present

## 2021-06-17 DIAGNOSIS — M792 Neuralgia and neuritis, unspecified: Secondary | ICD-10-CM | POA: Diagnosis not present

## 2021-06-17 DIAGNOSIS — F410 Panic disorder [episodic paroxysmal anxiety] without agoraphobia: Secondary | ICD-10-CM | POA: Diagnosis not present

## 2021-06-17 DIAGNOSIS — F4321 Adjustment disorder with depressed mood: Secondary | ICD-10-CM | POA: Diagnosis not present

## 2021-06-17 DIAGNOSIS — F5101 Primary insomnia: Secondary | ICD-10-CM | POA: Diagnosis not present

## 2021-06-17 DIAGNOSIS — F411 Generalized anxiety disorder: Secondary | ICD-10-CM | POA: Diagnosis not present

## 2021-06-19 DIAGNOSIS — F430 Acute stress reaction: Secondary | ICD-10-CM | POA: Diagnosis not present

## 2021-06-19 DIAGNOSIS — F410 Panic disorder [episodic paroxysmal anxiety] without agoraphobia: Secondary | ICD-10-CM | POA: Diagnosis not present

## 2021-06-19 DIAGNOSIS — I10 Essential (primary) hypertension: Secondary | ICD-10-CM | POA: Diagnosis not present

## 2021-06-20 DIAGNOSIS — I152 Hypertension secondary to endocrine disorders: Secondary | ICD-10-CM | POA: Diagnosis not present

## 2021-06-20 DIAGNOSIS — E870 Hyperosmolality and hypernatremia: Secondary | ICD-10-CM | POA: Diagnosis not present

## 2021-06-20 DIAGNOSIS — E876 Hypokalemia: Secondary | ICD-10-CM | POA: Diagnosis not present

## 2021-06-20 DIAGNOSIS — D72829 Elevated white blood cell count, unspecified: Secondary | ICD-10-CM | POA: Diagnosis not present

## 2021-06-21 ENCOUNTER — Other Ambulatory Visit: Payer: Self-pay | Admitting: *Deleted

## 2021-06-21 DIAGNOSIS — R600 Localized edema: Secondary | ICD-10-CM | POA: Diagnosis not present

## 2021-06-21 DIAGNOSIS — R0600 Dyspnea, unspecified: Secondary | ICD-10-CM | POA: Diagnosis not present

## 2021-06-21 DIAGNOSIS — I10 Essential (primary) hypertension: Secondary | ICD-10-CM | POA: Diagnosis not present

## 2021-06-21 DIAGNOSIS — F4322 Adjustment disorder with anxiety: Secondary | ICD-10-CM | POA: Diagnosis not present

## 2021-06-21 DIAGNOSIS — E876 Hypokalemia: Secondary | ICD-10-CM | POA: Diagnosis not present

## 2021-06-21 NOTE — Patient Outreach (Signed)
[place] Coordinator follow up. Mrs. Aldous resides in [facility].   Communication sent to [facility] SW to inquire about about transition plans.   Writer to plan outreach to discuss potential THN needs.    Marthenia Rolling, MSN, RN,BSN [place] Acute Care Coordinator [phone] [facility]) [phone]  (Toll free office)

## 2021-06-24 ENCOUNTER — Other Ambulatory Visit: Payer: Self-pay | Admitting: *Deleted

## 2021-06-24 NOTE — Patient Outreach (Signed)
THN Post- Acute Care Coordinator follow up. Mrs. Frenette resides in [facility].   Update received from [facility] indicating Mrs. Knotek's husband recently passed away. Transition plan is to return home.  Has two very supportive daughters.   Will continue to follow for potential [facility] Care Management needs.    Marthenia Rolling, MSN, RN,BSN [place] Acute Care Coordinator [phone] [facility]) [phone]  (Toll free office)

## 2021-06-26 DIAGNOSIS — I152 Hypertension secondary to endocrine disorders: Secondary | ICD-10-CM | POA: Diagnosis not present

## 2021-06-26 DIAGNOSIS — E876 Hypokalemia: Secondary | ICD-10-CM | POA: Diagnosis not present

## 2021-06-26 DIAGNOSIS — S8262XA Displaced fracture of lateral malleolus of left fibula, initial encounter for closed fracture: Secondary | ICD-10-CM | POA: Diagnosis not present

## 2021-06-26 DIAGNOSIS — E870 Hyperosmolality and hypernatremia: Secondary | ICD-10-CM | POA: Diagnosis not present

## 2021-06-26 DIAGNOSIS — R195 Other fecal abnormalities: Secondary | ICD-10-CM | POA: Diagnosis not present

## 2021-06-26 DIAGNOSIS — F430 Acute stress reaction: Secondary | ICD-10-CM | POA: Diagnosis not present

## 2021-06-27 DIAGNOSIS — R195 Other fecal abnormalities: Secondary | ICD-10-CM | POA: Diagnosis not present

## 2021-06-27 DIAGNOSIS — I152 Hypertension secondary to endocrine disorders: Secondary | ICD-10-CM | POA: Diagnosis not present

## 2021-06-27 DIAGNOSIS — E876 Hypokalemia: Secondary | ICD-10-CM | POA: Diagnosis not present

## 2021-06-27 DIAGNOSIS — E1159 Type 2 diabetes mellitus with other circulatory complications: Secondary | ICD-10-CM | POA: Diagnosis not present

## 2021-06-27 DIAGNOSIS — F4322 Adjustment disorder with anxiety: Secondary | ICD-10-CM | POA: Diagnosis not present

## 2021-06-27 DIAGNOSIS — S82892D Other fracture of left lower leg, subsequent encounter for closed fracture with routine healing: Secondary | ICD-10-CM | POA: Diagnosis not present

## 2021-07-10 ENCOUNTER — Other Ambulatory Visit: Payer: Self-pay | Admitting: *Deleted

## 2021-07-10 NOTE — Patient Outreach (Signed)
[place] Coordinator follow up. Per [place] eligible member resides in  [facility]  Screened for potential [facility] Care Management services.   Communication sent to facility SW to inquire about transition plans.  Will collaborate with facility SW and follow up with resident as appropriate.  Will continue to follow for potential THN needs and transition plans while member resides in SNF.    Marthenia Rolling, MSN, RN,BSN [place] Acute Care Coordinator [phone] [facility]) [phone]  (Toll free office)

## 2021-07-11 DIAGNOSIS — S82892S Other fracture of left lower leg, sequela: Secondary | ICD-10-CM | POA: Diagnosis not present

## 2021-07-11 DIAGNOSIS — M810 Age-related osteoporosis without current pathological fracture: Secondary | ICD-10-CM | POA: Diagnosis not present

## 2021-07-11 DIAGNOSIS — G8929 Other chronic pain: Secondary | ICD-10-CM | POA: Diagnosis not present

## 2021-07-11 DIAGNOSIS — M109 Gout, unspecified: Secondary | ICD-10-CM | POA: Diagnosis not present

## 2021-07-11 DIAGNOSIS — E876 Hypokalemia: Secondary | ICD-10-CM | POA: Diagnosis not present

## 2021-07-11 DIAGNOSIS — F4320 Adjustment disorder, unspecified: Secondary | ICD-10-CM | POA: Diagnosis not present

## 2021-07-11 DIAGNOSIS — Z0189 Encounter for other specified special examinations: Secondary | ICD-10-CM | POA: Diagnosis not present

## 2021-07-11 DIAGNOSIS — E1159 Type 2 diabetes mellitus with other circulatory complications: Secondary | ICD-10-CM | POA: Diagnosis not present

## 2021-07-11 DIAGNOSIS — R269 Unspecified abnormalities of gait and mobility: Secondary | ICD-10-CM | POA: Diagnosis not present

## 2021-07-11 DIAGNOSIS — I152 Hypertension secondary to endocrine disorders: Secondary | ICD-10-CM | POA: Diagnosis not present

## 2021-07-16 DIAGNOSIS — M810 Age-related osteoporosis without current pathological fracture: Secondary | ICD-10-CM | POA: Diagnosis not present

## 2021-07-16 DIAGNOSIS — E559 Vitamin D deficiency, unspecified: Secondary | ICD-10-CM | POA: Diagnosis not present

## 2021-07-18 ENCOUNTER — Other Ambulatory Visit: Payer: Self-pay | Admitting: *Deleted

## 2021-07-18 NOTE — Patient Outreach (Signed)
THN Post- Acute Care Coordinator follow up. Per [place] eligible member resides in  [facility].  Screened for potential [facility] Care Management services as a benefit of member's insurance plan.  Facility site visit to [facility]  skilled nursing facility. Met with Pamela Snyder, SW concerning member's progress, transition plan, and potential THN needs.   Anticipated transition plan is home with home health. Member's husband recently passed. SNF SW states she has given Pamela Snyder resources for grief counseling. Member to contact [facility] post SNF to schedule visit. Primary support and contact is Pamela Snyder daughter Pamela Snyder.   Went to speak with Pamela Snyder at bedside. However, she was out of the room with therapy. Writer asked that charge nurse place [place] Management brochure, 24-hr nurse advice line, and writer's contact information at bedside.   Writer will follow up at later time regarding [place] Management services.  Will continue to collaborate with SNF SW and follow transition plans/needs while member resides in SNF.   Pamela Rolling, MSN, RN,BSN [place] Acute Care Coordinator [phone] [facility]) [phone]  (Toll free office)

## 2021-07-25 ENCOUNTER — Other Ambulatory Visit: Payer: Self-pay | Admitting: *Deleted

## 2021-07-25 DIAGNOSIS — Z4889 Encounter for other specified surgical aftercare: Secondary | ICD-10-CM | POA: Diagnosis not present

## 2021-07-25 DIAGNOSIS — S8262XA Displaced fracture of lateral malleolus of left fibula, initial encounter for closed fracture: Secondary | ICD-10-CM | POA: Diagnosis not present

## 2021-07-25 NOTE — Patient Outreach (Addendum)
THN Post- Acute Care Coordinator follow up. Per [place] eligible member resides in [facility].  Screened for potential [facility] Care Management services as a benefit of member's insurance plan.  Facility site visit to [facility] skilled nursing facility. Met with Pamela Snyder, SNF SW concerning member's progress, transition plan, and potential THN needs. Pamela Snyder reports care plan meeting scheduled for tomorrow 07/26/21. Will likely set transition date at that time. Anticipated transition plan is home. Pamela Snyder husband recently passed. She has supportive daughters. SNF SW previously reported member has been provided with agency name and contact information for grief counseling. Facility has been in contact with daughter/DPR Pamela Snyder.   Spoke with Pamela Snyder in her room at [facility] to discuss transition plans and [place] Management services. Pamela Snyder was resting after therapy. States she was having a little pain after therapy session. Discussed Probation officer will alert her nurse. Discussed [facility] Care Management services. Pamela Snyder is agreeable is agreeable to [place] Management follow up. Denies having concerns with transportation or meals.   Confirmed PCP is Pamela Stalker, PA-C with [facility] at [facility]   Confirmed best contact number for Pamela Snyder is [phone].  Provided [facility] Care Management folder with brochure, 24-hr nurse advice line magnet, and writer's contact information.   Will continue to collaborate with SNF SW and follow transition plans/date while member resides in SNF. Will also for home health arrangements.    Marthenia Rolling, MSN, RN,BSN [place] Acute Care Coordinator [phone] [facility]) [phone]  (Toll free office)

## 2021-07-31 DIAGNOSIS — M5416 Radiculopathy, lumbar region: Secondary | ICD-10-CM | POA: Diagnosis not present

## 2021-07-31 DIAGNOSIS — M21371 Foot drop, right foot: Secondary | ICD-10-CM | POA: Diagnosis not present

## 2021-07-31 DIAGNOSIS — E1142 Type 2 diabetes mellitus with diabetic polyneuropathy: Secondary | ICD-10-CM | POA: Diagnosis not present

## 2021-07-31 DIAGNOSIS — M48061 Spinal stenosis, lumbar region without neurogenic claudication: Secondary | ICD-10-CM | POA: Diagnosis not present

## 2021-08-01 ENCOUNTER — Other Ambulatory Visit: Payer: Self-pay | Admitting: *Deleted

## 2021-08-01 NOTE — Patient Outreach (Signed)
THN Post- Acute Care Coordinator follow up. Per [place] eligible member resides in  [facility].  Screened for potential [facility] Care Management services as a benefit of member's insurance plan.  Facility site visit to [facility] skilled nursing facility. Met with Irine Seal, SNF SW concerning member's transition date/plan. Transition plan is return home next week. Will have home health. Agency not known yet.   Spoke with Mrs. Parlier in her room at [facility]. Mrs. Trim previously agreed to [facility] follow up. Discussed writer will make referral to [place] Management when she returns home. Mrs. Hardcastle remains agreeable.    Confirmed best contact number for Mrs. Wigley is [phone].  Will make referral for [place] Management for care coordination upon SNF transition. Will follow back up with SNF SW regarding home health arrangements.  Marthenia Rolling, MSN, RN,BSN [place] Acute Care Coordinator [phone] [facility]) [phone]  (Toll free office)

## 2021-08-07 DIAGNOSIS — F410 Panic disorder [episodic paroxysmal anxiety] without agoraphobia: Secondary | ICD-10-CM | POA: Diagnosis not present

## 2021-08-07 DIAGNOSIS — N182 Chronic kidney disease, stage 2 (mild): Secondary | ICD-10-CM | POA: Diagnosis not present

## 2021-08-07 DIAGNOSIS — U071 COVID-19: Secondary | ICD-10-CM | POA: Diagnosis not present

## 2021-08-07 DIAGNOSIS — E1122 Type 2 diabetes mellitus with diabetic chronic kidney disease: Secondary | ICD-10-CM | POA: Diagnosis not present

## 2021-08-16 DIAGNOSIS — S82892D Other fracture of left lower leg, subsequent encounter for closed fracture with routine healing: Secondary | ICD-10-CM | POA: Diagnosis not present

## 2021-08-16 DIAGNOSIS — M792 Neuralgia and neuritis, unspecified: Secondary | ICD-10-CM | POA: Diagnosis not present

## 2021-08-16 DIAGNOSIS — U071 COVID-19: Secondary | ICD-10-CM | POA: Diagnosis not present

## 2021-08-16 DIAGNOSIS — F410 Panic disorder [episodic paroxysmal anxiety] without agoraphobia: Secondary | ICD-10-CM | POA: Diagnosis not present

## 2021-08-16 DIAGNOSIS — M109 Gout, unspecified: Secondary | ICD-10-CM | POA: Diagnosis not present

## 2021-08-16 DIAGNOSIS — N182 Chronic kidney disease, stage 2 (mild): Secondary | ICD-10-CM | POA: Diagnosis not present

## 2021-08-16 DIAGNOSIS — E1159 Type 2 diabetes mellitus with other circulatory complications: Secondary | ICD-10-CM | POA: Diagnosis not present

## 2021-08-16 DIAGNOSIS — I152 Hypertension secondary to endocrine disorders: Secondary | ICD-10-CM | POA: Diagnosis not present

## 2021-08-19 ENCOUNTER — Other Ambulatory Visit: Payer: Self-pay | Admitting: *Deleted

## 2021-08-19 DIAGNOSIS — M810 Age-related osteoporosis without current pathological fracture: Secondary | ICD-10-CM | POA: Diagnosis not present

## 2021-08-19 DIAGNOSIS — Z4789 Encounter for other orthopedic aftercare: Secondary | ICD-10-CM | POA: Diagnosis not present

## 2021-08-19 DIAGNOSIS — M109 Gout, unspecified: Secondary | ICD-10-CM | POA: Diagnosis not present

## 2021-08-19 DIAGNOSIS — E1159 Type 2 diabetes mellitus with other circulatory complications: Secondary | ICD-10-CM | POA: Diagnosis not present

## 2021-08-19 DIAGNOSIS — Z9181 History of falling: Secondary | ICD-10-CM | POA: Diagnosis not present

## 2021-08-19 DIAGNOSIS — I152 Hypertension secondary to endocrine disorders: Secondary | ICD-10-CM | POA: Diagnosis not present

## 2021-08-19 DIAGNOSIS — Z7984 Long term (current) use of oral hypoglycemic drugs: Secondary | ICD-10-CM | POA: Diagnosis not present

## 2021-08-19 DIAGNOSIS — I10 Essential (primary) hypertension: Secondary | ICD-10-CM

## 2021-08-19 DIAGNOSIS — T8484XD Pain due to internal orthopedic prosthetic devices, implants and grafts, subsequent encounter: Secondary | ICD-10-CM | POA: Diagnosis not present

## 2021-08-19 NOTE — Patient Outreach (Signed)
[place] [facility]) Care Management  08/19/2021  Pamela Snyder 1946/02/10 [id number]   Received hospital referral from Marthenia Rolling, RN for [place] (central) for complex case management. Assigned to Deloria Lair, RN Care Coordinator for follow up.  [place] Management Assistant [phone]

## 2021-08-19 NOTE — Patient Outreach (Signed)
THN Post- Acute Care Coordinator follow up. Member screened for potential [facility] services as a a benefit of [name] plan.   Verified in [facility] that [name] transitioned from [facility] to home on 08/17/21. Verified with SNF SW that [facility] home health was arranged.  Will make [place] Management referral for [facility] for complex case management. [name] previously agreed to [place] Management services.  Confirmed best contact number for [name] is [phone].  [name] has medical history of HTN, HLD, DM.   Marthenia Rolling, MSN, RN,BSN [place] Acute Care Coordinator [phone] [facility]) [phone]  (Toll free office)

## 2021-08-20 DIAGNOSIS — K219 Gastro-esophageal reflux disease without esophagitis: Secondary | ICD-10-CM | POA: Diagnosis not present

## 2021-08-20 DIAGNOSIS — N183 Chronic kidney disease, stage 3 unspecified: Secondary | ICD-10-CM | POA: Diagnosis not present

## 2021-08-20 DIAGNOSIS — E049 Nontoxic goiter, unspecified: Secondary | ICD-10-CM | POA: Diagnosis not present

## 2021-08-20 DIAGNOSIS — E1143 Type 2 diabetes mellitus with diabetic autonomic (poly)neuropathy: Secondary | ICD-10-CM | POA: Diagnosis not present

## 2021-08-20 DIAGNOSIS — I10 Essential (primary) hypertension: Secondary | ICD-10-CM | POA: Diagnosis not present

## 2021-08-20 DIAGNOSIS — E559 Vitamin D deficiency, unspecified: Secondary | ICD-10-CM | POA: Diagnosis not present

## 2021-08-20 DIAGNOSIS — M48061 Spinal stenosis, lumbar region without neurogenic claudication: Secondary | ICD-10-CM | POA: Diagnosis not present

## 2021-08-20 DIAGNOSIS — I2510 Atherosclerotic heart disease of native coronary artery without angina pectoris: Secondary | ICD-10-CM | POA: Diagnosis not present

## 2021-08-20 DIAGNOSIS — E7800 Pure hypercholesterolemia, unspecified: Secondary | ICD-10-CM | POA: Diagnosis not present

## 2021-08-20 DIAGNOSIS — M810 Age-related osteoporosis without current pathological fracture: Secondary | ICD-10-CM | POA: Diagnosis not present

## 2021-08-23 DIAGNOSIS — T8484XD Pain due to internal orthopedic prosthetic devices, implants and grafts, subsequent encounter: Secondary | ICD-10-CM | POA: Diagnosis not present

## 2021-08-23 DIAGNOSIS — M810 Age-related osteoporosis without current pathological fracture: Secondary | ICD-10-CM | POA: Diagnosis not present

## 2021-08-23 DIAGNOSIS — M109 Gout, unspecified: Secondary | ICD-10-CM | POA: Diagnosis not present

## 2021-08-23 DIAGNOSIS — Z4789 Encounter for other orthopedic aftercare: Secondary | ICD-10-CM | POA: Diagnosis not present

## 2021-08-23 DIAGNOSIS — E1159 Type 2 diabetes mellitus with other circulatory complications: Secondary | ICD-10-CM | POA: Diagnosis not present

## 2021-08-23 DIAGNOSIS — I152 Hypertension secondary to endocrine disorders: Secondary | ICD-10-CM | POA: Diagnosis not present

## 2021-08-26 DIAGNOSIS — I152 Hypertension secondary to endocrine disorders: Secondary | ICD-10-CM | POA: Diagnosis not present

## 2021-08-26 DIAGNOSIS — M810 Age-related osteoporosis without current pathological fracture: Secondary | ICD-10-CM | POA: Diagnosis not present

## 2021-08-26 DIAGNOSIS — T8484XD Pain due to internal orthopedic prosthetic devices, implants and grafts, subsequent encounter: Secondary | ICD-10-CM | POA: Diagnosis not present

## 2021-08-26 DIAGNOSIS — Z4789 Encounter for other orthopedic aftercare: Secondary | ICD-10-CM | POA: Diagnosis not present

## 2021-08-26 DIAGNOSIS — M109 Gout, unspecified: Secondary | ICD-10-CM | POA: Diagnosis not present

## 2021-08-26 DIAGNOSIS — E1159 Type 2 diabetes mellitus with other circulatory complications: Secondary | ICD-10-CM | POA: Diagnosis not present

## 2021-08-30 DIAGNOSIS — M810 Age-related osteoporosis without current pathological fracture: Secondary | ICD-10-CM | POA: Diagnosis not present

## 2021-08-30 DIAGNOSIS — M109 Gout, unspecified: Secondary | ICD-10-CM | POA: Diagnosis not present

## 2021-08-30 DIAGNOSIS — E1159 Type 2 diabetes mellitus with other circulatory complications: Secondary | ICD-10-CM | POA: Diagnosis not present

## 2021-08-30 DIAGNOSIS — Z4789 Encounter for other orthopedic aftercare: Secondary | ICD-10-CM | POA: Diagnosis not present

## 2021-08-30 DIAGNOSIS — I152 Hypertension secondary to endocrine disorders: Secondary | ICD-10-CM | POA: Diagnosis not present

## 2021-08-30 DIAGNOSIS — T8484XD Pain due to internal orthopedic prosthetic devices, implants and grafts, subsequent encounter: Secondary | ICD-10-CM | POA: Diagnosis not present

## 2021-09-04 DIAGNOSIS — I152 Hypertension secondary to endocrine disorders: Secondary | ICD-10-CM | POA: Diagnosis not present

## 2021-09-04 DIAGNOSIS — T8484XD Pain due to internal orthopedic prosthetic devices, implants and grafts, subsequent encounter: Secondary | ICD-10-CM | POA: Diagnosis not present

## 2021-09-04 DIAGNOSIS — E1159 Type 2 diabetes mellitus with other circulatory complications: Secondary | ICD-10-CM | POA: Diagnosis not present

## 2021-09-04 DIAGNOSIS — M109 Gout, unspecified: Secondary | ICD-10-CM | POA: Diagnosis not present

## 2021-09-04 DIAGNOSIS — M810 Age-related osteoporosis without current pathological fracture: Secondary | ICD-10-CM | POA: Diagnosis not present

## 2021-09-04 DIAGNOSIS — Z4789 Encounter for other orthopedic aftercare: Secondary | ICD-10-CM | POA: Diagnosis not present

## 2021-09-05 DIAGNOSIS — M810 Age-related osteoporosis without current pathological fracture: Secondary | ICD-10-CM | POA: Diagnosis not present

## 2021-09-05 DIAGNOSIS — M109 Gout, unspecified: Secondary | ICD-10-CM | POA: Diagnosis not present

## 2021-09-05 DIAGNOSIS — T8484XD Pain due to internal orthopedic prosthetic devices, implants and grafts, subsequent encounter: Secondary | ICD-10-CM | POA: Diagnosis not present

## 2021-09-05 DIAGNOSIS — Z4789 Encounter for other orthopedic aftercare: Secondary | ICD-10-CM | POA: Diagnosis not present

## 2021-09-05 DIAGNOSIS — E1159 Type 2 diabetes mellitus with other circulatory complications: Secondary | ICD-10-CM | POA: Diagnosis not present

## 2021-09-05 DIAGNOSIS — I152 Hypertension secondary to endocrine disorders: Secondary | ICD-10-CM | POA: Diagnosis not present

## 2021-09-10 DIAGNOSIS — I152 Hypertension secondary to endocrine disorders: Secondary | ICD-10-CM | POA: Diagnosis not present

## 2021-09-10 DIAGNOSIS — E1159 Type 2 diabetes mellitus with other circulatory complications: Secondary | ICD-10-CM | POA: Diagnosis not present

## 2021-09-10 DIAGNOSIS — M109 Gout, unspecified: Secondary | ICD-10-CM | POA: Diagnosis not present

## 2021-09-10 DIAGNOSIS — T8484XD Pain due to internal orthopedic prosthetic devices, implants and grafts, subsequent encounter: Secondary | ICD-10-CM | POA: Diagnosis not present

## 2021-09-10 DIAGNOSIS — Z4789 Encounter for other orthopedic aftercare: Secondary | ICD-10-CM | POA: Diagnosis not present

## 2021-09-10 DIAGNOSIS — M810 Age-related osteoporosis without current pathological fracture: Secondary | ICD-10-CM | POA: Diagnosis not present

## 2021-09-12 DIAGNOSIS — M810 Age-related osteoporosis without current pathological fracture: Secondary | ICD-10-CM | POA: Diagnosis not present

## 2021-09-12 DIAGNOSIS — Z4789 Encounter for other orthopedic aftercare: Secondary | ICD-10-CM | POA: Diagnosis not present

## 2021-09-12 DIAGNOSIS — M25572 Pain in left ankle and joints of left foot: Secondary | ICD-10-CM | POA: Diagnosis not present

## 2021-09-12 DIAGNOSIS — E1159 Type 2 diabetes mellitus with other circulatory complications: Secondary | ICD-10-CM | POA: Diagnosis not present

## 2021-09-12 DIAGNOSIS — T8484XD Pain due to internal orthopedic prosthetic devices, implants and grafts, subsequent encounter: Secondary | ICD-10-CM | POA: Diagnosis not present

## 2021-09-12 DIAGNOSIS — M109 Gout, unspecified: Secondary | ICD-10-CM | POA: Diagnosis not present

## 2021-09-12 DIAGNOSIS — S8262XA Displaced fracture of lateral malleolus of left fibula, initial encounter for closed fracture: Secondary | ICD-10-CM | POA: Diagnosis not present

## 2021-09-12 DIAGNOSIS — I152 Hypertension secondary to endocrine disorders: Secondary | ICD-10-CM | POA: Diagnosis not present

## 2021-09-16 DIAGNOSIS — T8484XD Pain due to internal orthopedic prosthetic devices, implants and grafts, subsequent encounter: Secondary | ICD-10-CM | POA: Diagnosis not present

## 2021-09-16 DIAGNOSIS — I152 Hypertension secondary to endocrine disorders: Secondary | ICD-10-CM | POA: Diagnosis not present

## 2021-09-16 DIAGNOSIS — M109 Gout, unspecified: Secondary | ICD-10-CM | POA: Diagnosis not present

## 2021-09-16 DIAGNOSIS — E1159 Type 2 diabetes mellitus with other circulatory complications: Secondary | ICD-10-CM | POA: Diagnosis not present

## 2021-09-16 DIAGNOSIS — M810 Age-related osteoporosis without current pathological fracture: Secondary | ICD-10-CM | POA: Diagnosis not present

## 2021-09-16 DIAGNOSIS — Z4789 Encounter for other orthopedic aftercare: Secondary | ICD-10-CM | POA: Diagnosis not present

## 2021-09-18 DIAGNOSIS — M109 Gout, unspecified: Secondary | ICD-10-CM | POA: Diagnosis not present

## 2021-09-18 DIAGNOSIS — Z4789 Encounter for other orthopedic aftercare: Secondary | ICD-10-CM | POA: Diagnosis not present

## 2021-09-18 DIAGNOSIS — Z9181 History of falling: Secondary | ICD-10-CM | POA: Diagnosis not present

## 2021-09-18 DIAGNOSIS — Z7984 Long term (current) use of oral hypoglycemic drugs: Secondary | ICD-10-CM | POA: Diagnosis not present

## 2021-09-18 DIAGNOSIS — T8484XD Pain due to internal orthopedic prosthetic devices, implants and grafts, subsequent encounter: Secondary | ICD-10-CM | POA: Diagnosis not present

## 2021-09-18 DIAGNOSIS — E1159 Type 2 diabetes mellitus with other circulatory complications: Secondary | ICD-10-CM | POA: Diagnosis not present

## 2021-09-18 DIAGNOSIS — M810 Age-related osteoporosis without current pathological fracture: Secondary | ICD-10-CM | POA: Diagnosis not present

## 2021-09-18 DIAGNOSIS — I152 Hypertension secondary to endocrine disorders: Secondary | ICD-10-CM | POA: Diagnosis not present

## 2021-09-24 DIAGNOSIS — M810 Age-related osteoporosis without current pathological fracture: Secondary | ICD-10-CM | POA: Diagnosis not present

## 2021-09-24 DIAGNOSIS — M109 Gout, unspecified: Secondary | ICD-10-CM | POA: Diagnosis not present

## 2021-09-24 DIAGNOSIS — E1159 Type 2 diabetes mellitus with other circulatory complications: Secondary | ICD-10-CM | POA: Diagnosis not present

## 2021-09-24 DIAGNOSIS — I152 Hypertension secondary to endocrine disorders: Secondary | ICD-10-CM | POA: Diagnosis not present

## 2021-09-24 DIAGNOSIS — T8484XD Pain due to internal orthopedic prosthetic devices, implants and grafts, subsequent encounter: Secondary | ICD-10-CM | POA: Diagnosis not present

## 2021-09-24 DIAGNOSIS — Z4789 Encounter for other orthopedic aftercare: Secondary | ICD-10-CM | POA: Diagnosis not present

## 2021-09-26 DIAGNOSIS — Z4789 Encounter for other orthopedic aftercare: Secondary | ICD-10-CM | POA: Diagnosis not present

## 2021-09-26 DIAGNOSIS — I152 Hypertension secondary to endocrine disorders: Secondary | ICD-10-CM | POA: Diagnosis not present

## 2021-09-26 DIAGNOSIS — T8484XD Pain due to internal orthopedic prosthetic devices, implants and grafts, subsequent encounter: Secondary | ICD-10-CM | POA: Diagnosis not present

## 2021-09-26 DIAGNOSIS — M109 Gout, unspecified: Secondary | ICD-10-CM | POA: Diagnosis not present

## 2021-09-26 DIAGNOSIS — E1159 Type 2 diabetes mellitus with other circulatory complications: Secondary | ICD-10-CM | POA: Diagnosis not present

## 2021-09-26 DIAGNOSIS — M810 Age-related osteoporosis without current pathological fracture: Secondary | ICD-10-CM | POA: Diagnosis not present

## 2021-09-30 DIAGNOSIS — R790 Abnormal level of blood mineral: Secondary | ICD-10-CM | POA: Diagnosis not present

## 2021-10-01 DIAGNOSIS — M109 Gout, unspecified: Secondary | ICD-10-CM | POA: Diagnosis not present

## 2021-10-01 DIAGNOSIS — I152 Hypertension secondary to endocrine disorders: Secondary | ICD-10-CM | POA: Diagnosis not present

## 2021-10-01 DIAGNOSIS — Z4789 Encounter for other orthopedic aftercare: Secondary | ICD-10-CM | POA: Diagnosis not present

## 2021-10-01 DIAGNOSIS — T8484XD Pain due to internal orthopedic prosthetic devices, implants and grafts, subsequent encounter: Secondary | ICD-10-CM | POA: Diagnosis not present

## 2021-10-01 DIAGNOSIS — E1159 Type 2 diabetes mellitus with other circulatory complications: Secondary | ICD-10-CM | POA: Diagnosis not present

## 2021-10-01 DIAGNOSIS — M810 Age-related osteoporosis without current pathological fracture: Secondary | ICD-10-CM | POA: Diagnosis not present

## 2021-10-07 DIAGNOSIS — M810 Age-related osteoporosis without current pathological fracture: Secondary | ICD-10-CM | POA: Diagnosis not present

## 2021-10-07 DIAGNOSIS — M109 Gout, unspecified: Secondary | ICD-10-CM | POA: Diagnosis not present

## 2021-10-07 DIAGNOSIS — E1159 Type 2 diabetes mellitus with other circulatory complications: Secondary | ICD-10-CM | POA: Diagnosis not present

## 2021-10-07 DIAGNOSIS — Z4789 Encounter for other orthopedic aftercare: Secondary | ICD-10-CM | POA: Diagnosis not present

## 2021-10-07 DIAGNOSIS — T8484XD Pain due to internal orthopedic prosthetic devices, implants and grafts, subsequent encounter: Secondary | ICD-10-CM | POA: Diagnosis not present

## 2021-10-07 DIAGNOSIS — I152 Hypertension secondary to endocrine disorders: Secondary | ICD-10-CM | POA: Diagnosis not present

## 2021-10-18 ENCOUNTER — Ambulatory Visit: Payer: Medicare Other | Admitting: Physical Therapy

## 2021-10-23 ENCOUNTER — Ambulatory Visit: Payer: Medicare Other | Attending: Student | Admitting: Physical Therapy

## 2021-10-23 DIAGNOSIS — M6281 Muscle weakness (generalized): Secondary | ICD-10-CM | POA: Insufficient documentation

## 2021-10-23 DIAGNOSIS — R2681 Unsteadiness on feet: Secondary | ICD-10-CM | POA: Diagnosis not present

## 2021-10-23 DIAGNOSIS — R262 Difficulty in walking, not elsewhere classified: Secondary | ICD-10-CM | POA: Insufficient documentation

## 2021-10-23 NOTE — Therapy (Signed)
[place] ?[place] ?[place], [place], [address] ?Phone: [phone]   Fax:  [phone] ? ?Physical Therapy Evaluation ? ?Patient Details  ?Name: Pamela Snyder ?MRN: [id number] ?Date of Birth: June 21, 1946 ?Referring Provider (PT): Alfredo Martinez ? ? ?Encounter Date: 10/23/2021 ? ? PT End of Session - 10/23/21 1747   ? ? Visit Number 1   ? Date for PT Re-Evaluation 01/22/22   ? PT Start Time 1700   ? PT Stop Time 7902   ? PT Time Calculation (min) 48 min   ? Equipment Utilized During Treatment Gait belt   ? Activity Tolerance Patient tolerated treatment well   ? Behavior During Therapy [facility] for tasks assessed/performed   ? ?  ?  ? ?  ? ? ?Past Medical History:  ?Diagnosis Date  ? Anxiety   ? Arthritis   ? "right foot; spine; hands" (11/23/2014)  ? Charcot foot due to diabetes mellitus ([place])   ? Depression   ? GERD (gastroesophageal reflux disease)   ? Gout   ? Hyperlipemia   ? Hypertension   ? Migraine   ? hx  ? Neuropathy   ? Numbness   ? Thyroid goiter 1986  ? Type II diabetes mellitus ([place])   ? ? ?Past Surgical History:  ?Procedure Laterality Date  ? ABDOMINAL HYSTERECTOMY  1999  ? w/BSO  ? ACHILLES TENDON LENGTHENING Right 11/23/2014  ? ACHILLES TENDON LENGTHENING Right 11/23/2014  ? Procedure: RIGHT ACHILLES PERCUTANEOUS TENDON LENGTHENING;  Surgeon: Wylene Simmer, MD;  Location: [place];  Service: Orthopedics;  Laterality: Right;  ? APPENDECTOMY  1963  ? ARTHRODESIS Right 11/23/2014  ? mid foot  ? CARDIAC CATHETERIZATION  08/2004  ? FOOT ARTHRODESIS Right 11/23/2014  ? Procedure: RIGHT MID FOOT ARTHRODESIS;  Surgeon: Wylene Simmer, MD;  Location: [place];  Service: Orthopedics;  Laterality: Right;  ? METATARSAL OSTEOTOMY Right 11/23/2014  ? Procedure: RIGHT MID FOOT OSTEOTOMY;  Surgeon: Wylene Simmer, MD;  Location: [place];  Service: Orthopedics;  Laterality: Right;  ? ORIF ANKLE FRACTURE Left 05/16/2021  ? Procedure: OPEN REDUCTION INTERNAL FIXATION (ORIF) Left ankle lateral  malleolus, possible deltoid ligament repair;  Surgeon: Wylene Simmer, MD;  Location: [place];  Service: Orthopedics;  Laterality: Left;  ? OSTEOTOMY Right 11/23/2014  ? mid foot  ? PARTIAL THYMECTOMY  1986  ? ? side  ? TONSILLECTOMY  1954  ? ? ?There were no vitals filed for this visit. ? ? ? Subjective Assessment - 10/23/21 1702   ? ? Subjective Patient reports November 2022 she was getting up and fell, she sustained a left malleolar fracture, she reports that due to some neuropathy she did not feel much, she reports that a few weeks later she started having pain after doing walking and the fracture was found, she had an ORIF.  She went to the nursing home, she then got covid.  She was unable to do much.  She was in a cast and a boot up until about 4 weeks ago.  [facility] x-rays showed good alignment and healing.  She is FWBing and okay to do strength and ROM exercises   ? Pertinent History reports HNP in the neck and the low back, reports sciatica and some numbness in the hands.  She had right foot surgery 2016 Charcot foot   ? How long can you stand comfortably? 3-5 minutes   ? How long can you walk comfortably? 50-70 feet   ? Patient Stated Goals  walk normally with a rollator, feel stronger and have more indepdnence   ? Currently in Pain? Yes   ? Pain Score 0-No pain   ? Pain Location Ankle   ? Pain Orientation Left   ? Pain Descriptors / Indicators Sore;Tightness   ? Pain Onset More than a month ago   ? Pain Frequency Rarely   ? Aggravating Factors  has lyrica and neuropahty minimal to no pain   ? Effect of Pain on Daily Activities I feel weak all over and feel like I cannot function   ? ?  ?  ? ?  ? ? ? ? ? [facility] PT Assessment - 10/23/21 0001   ? ?  ? Assessment  ? Medical Diagnosis left ankle ORIF weakness   ? Referring Provider (PT) Alfredo Martinez   ? Onset Date/Surgical Date 05/16/21   ?  ? Precautions  ? Precautions Fall   ?  ? Balance Screen  ? Has the patient fallen in the past 6 months Yes   ? How many times? 1   ? Has  the patient had a decrease in activity level because of a fear of falling?  Yes   ? Is the patient reluctant to leave their home because of a fear of falling?  Yes   ?  ? Home Environment  ? Additional Comments has a ramp to get in home, a few steps to get in home, was able to do cooking, cleaning and shopping with a rollator   ?  ? Prior Function  ? Level of Independence Independent with community mobility with device   ? Vocation Retired   ? Leisure no exercise   ?  ? Posture/Postural Control  ? Posture Comments fwd head, rounded shoulders   ?  ? ROM / Strength  ? AROM / PROM / Strength AROM;Strength   ?  ? AROM  ? AROM Assessment Site Ankle   ? Right/Left Ankle Left   ? Left Ankle Dorsiflexion 0   ? Left Ankle Plantar Flexion 10   ? Left Ankle Inversion 0   ? Left Ankle Eversion 0   ?  ? Strength  ? Overall Strength Comments has minimal mm activity minimal motions and cannot resist motions, knees and hips 4-/5, UE's 4-/5   ?  ? Palpation  ? Palpation comment minimal sensation in the feet   ?  ? Ambulation/Gait  ? Gait Comments with FWW, left foot has minimal control and flops,   ?  ? Standardized Balance Assessment  ? Standardized Balance Assessment Timed Up and Go Test;Berg Balance Test   ?  ? Berg Balance Test  ? Sit to Stand Able to stand  independently using hands   ? Standing Unsupported Unable to stand 30 seconds unassisted   ? Sitting with Back Unsupported but Feet Supported on Floor or Stool Able to sit safely and securely 2 minutes   ? Stand to Sit Controls descent by using hands   ? Transfers Able to transfer safely, definite need of hands   ? Standing Unsupported with Eyes Closed Needs help to keep from falling   ? Standing Unsupported with Feet Together Needs help to attain position and unable to hold for 15 seconds   ? From Standing, Reach Forward with Outstretched Arm Loses balance while trying/requires external support   ? From Standing Position, Pick up Object from Floor Unable to try/needs assist  to keep balance   ? From Standing Position, Turn to Look Behind  Over each Shoulder Needs assist to keep from losing balance and falling   ? Turn 360 Degrees Needs assistance while turning   ? Standing Unsupported, Alternately Place Feet on Step/Stool Needs assistance to keep from falling or unable to try   ? Standing Unsupported, One Foot in [employer] balance while stepping or standing   ? Standing on One Leg Unable to try or needs assist to prevent fall   ? Total Score 13   ?  ? Timed Up and Go Test  ? Normal TUG (seconds) 35   ? TUG Comments with FWW   ? ?  ?  ? ?  ? ? ? ? ? ? ? ? ? ? ? ? ? ?Objective measurements completed on examination: See above findings.  ? ? ? ? ? [facility] Adult PT Treatment/Exercise - 10/23/21 0001   ? ?  ? Exercises  ? Exercises Knee/Hip;Ankle   ?  ? Knee/Hip Exercises: Aerobic  ? Nustep level 4 x 5 minutes   ?  ? Knee/Hip Exercises: Seated  ? Other Seated Knee/Hip Exercises red tband core rows and extension   ? ?  ?  ? ?  ? ? ? ? ? ? ? ? ? ? ? ? PT Short Term Goals - 10/23/21 1758   ? ?  ? PT SHORT TERM GOAL #1  ? Title Pt will be I and compliant with HEP   ? Time 4   ? Period Weeks   ? Status New   ? ?  ?  ? ?  ? ? ? ? PT Long Term Goals - 10/23/21 1758   ? ?  ? PT LONG TERM GOAL #1  ? Title Pt will improve BERG balance without AD to >18 to show improved balance.   ? Time 12   ? Period Weeks   ? Status New   ?  ? PT LONG TERM GOAL #2  ? Title Pt will improve gait speed and TUG time to show improved gait and balance below 24 seconds   ? Time 12   ? Period Weeks   ? Status New   ?  ? PT LONG TERM GOAL #3  ? Title increase LE strength to 4+/5   ? Time 12   ? Period Weeks   ? Status New   ?  ? PT LONG TERM GOAL #4  ? Title increase UE strength to 4+/5   ? Time 12   ? Period Weeks   ? Status New   ?  ? PT LONG TERM GOAL #5  ? Title report able to make a sandwich without sitting   ? Time 12   ? Period Weeks   ? Status New   ? ?  ?  ? ?  ? ? ? ? ? ? ? ? ? Plan - 10/23/21 1748   ? ? Clinical  Impression Statement PAtient had a fall in November, she sustained an ankle fracture but due to neuropathy, she did not know she hurt the ankle until a few weeks later, she then underwent ORIF.  She was in

## 2021-10-24 ENCOUNTER — Ambulatory Visit: Payer: Medicare Other | Admitting: Physical Therapy

## 2021-10-24 ENCOUNTER — Encounter: Payer: Self-pay | Admitting: Physical Therapy

## 2021-10-24 DIAGNOSIS — R2681 Unsteadiness on feet: Secondary | ICD-10-CM | POA: Diagnosis not present

## 2021-10-24 DIAGNOSIS — M6281 Muscle weakness (generalized): Secondary | ICD-10-CM | POA: Diagnosis not present

## 2021-10-24 DIAGNOSIS — R262 Difficulty in walking, not elsewhere classified: Secondary | ICD-10-CM | POA: Diagnosis not present

## 2021-10-24 NOTE — Therapy (Signed)
[place] ?[place] [address] ?[place], [place], [address] ?Phone: [phone]   Fax:  [phone] ? ?Physical Therapy Treatment ? ?Patient Details  ?Name: Sontee Desena Turbyfill ?MRN: [id number] ?Date of Birth: [birth year] ?Referring Provider (PT): Alfredo Martinez ? ? ?Encounter Date: 10/24/2021 ? ? PT End of Session - 10/24/21 1008   ? ? Visit Number 2   ? Date for PT Re-Evaluation 01/22/22   ? PT Start Time 0930   ? PT Stop Time 1015   ? PT Time Calculation (min) 45 min   ? Activity Tolerance Patient tolerated treatment well   ? Behavior During Therapy [facility] for tasks assessed/performed   ? ?  ?  ? ?  ? ? ?Past Medical History:  ?Diagnosis Date  ? Anxiety   ? Arthritis   ? "right foot; spine; hands" (11/23/2014)  ? Charcot foot due to diabetes mellitus ([place])   ? Depression   ? GERD (gastroesophageal reflux disease)   ? Gout   ? Hyperlipemia   ? Hypertension   ? Migraine   ? hx  ? Neuropathy   ? Numbness   ? Thyroid goiter 1986  ? Type II diabetes mellitus ([place])   ? ? ?Past Surgical History:  ?Procedure Laterality Date  ? ABDOMINAL HYSTERECTOMY  1999  ? w/BSO  ? ACHILLES TENDON LENGTHENING Right 11/23/2014  ? ACHILLES TENDON LENGTHENING Right 11/23/2014  ? Procedure: RIGHT ACHILLES PERCUTANEOUS TENDON LENGTHENING;  Surgeon: Wylene Simmer, MD;  Location: [place];  Service: Orthopedics;  Laterality: Right;  ? APPENDECTOMY  1963  ? ARTHRODESIS Right 11/23/2014  ? mid foot  ? CARDIAC CATHETERIZATION  08/2004  ? FOOT ARTHRODESIS Right 11/23/2014  ? Procedure: RIGHT MID FOOT ARTHRODESIS;  Surgeon: Wylene Simmer, MD;  Location: [place];  Service: Orthopedics;  Laterality: Right;  ? METATARSAL OSTEOTOMY Right 11/23/2014  ? Procedure: RIGHT MID FOOT OSTEOTOMY;  Surgeon: Wylene Simmer, MD;  Location: [place];  Service: Orthopedics;  Laterality: Right;  ? ORIF ANKLE FRACTURE Left 05/16/2021  ? Procedure: OPEN REDUCTION INTERNAL FIXATION (ORIF) Left ankle lateral malleolus, possible deltoid ligament repair;   Surgeon: Wylene Simmer, MD;  Location: [place];  Service: Orthopedics;  Laterality: Left;  ? OSTEOTOMY Right 11/23/2014  ? mid foot  ? PARTIAL THYMECTOMY  1986  ? ? side  ? TONSILLECTOMY  1954  ? ? ?There were no vitals filed for this visit. ? ? Subjective Assessment - 10/24/21 0930   ? ? Subjective Doing fine, has some issues with her back this weekend   ? Currently in Pain? No/denies   ? ?  ?  ? ?  ? ? ? ? ? ? ? ? ? ? ? ? ? ? ? ? ? ? ? ? [facility] Adult PT Treatment/Exercise - 10/24/21 0001   ? ?  ? Knee/Hip Exercises: Aerobic  ? Nustep level 3 x 6 minutes   ?  ? Knee/Hip Exercises: Standing  ? Other Standing Knee Exercises Standing marches w/ RW 2x10   ?  ? Knee/Hip Exercises: Seated  ? Long [employer] Strengthening;Both;2 sets;10 reps   ? Long Arc Quad Weight 3 lbs.   ? Sit to Sand 2 sets;10 reps;with UE support   to RW  ?  ? Ankle Exercises: Standing  ? Other Standing Ankle Exercises sanding Wt shift HHA x2 2x15''   ? Other Standing Ankle Exercises Alt 4in box taps in RW 2x10   ?  ? Ankle Exercises: Seated  ?  Other Seated Ankle Exercises Rows red 2x10   ? Other Seated Ankle Exercises HS green red 2x12   ?  ? Ankle Exercises: Stretches  ? Other Stretch Ankle ROM   ? ?  ?  ? ?  ? ? ? ? ? ? ? ? ? ? ? ? PT Short Term Goals - 10/23/21 1758   ? ?  ? PT SHORT TERM GOAL #1  ? Title Pt will be I and compliant with HEP   ? Time 4   ? Period Weeks   ? Status New   ? ?  ?  ? ?  ? ? ? ? PT Long Term Goals - 10/23/21 1758   ? ?  ? PT LONG TERM GOAL #1  ? Title Pt will improve BERG balance without AD to >18 to show improved balance.   ? Time 12   ? Period Weeks   ? Status New   ?  ? PT LONG TERM GOAL #2  ? Title Pt will improve gait speed and TUG time to show improved gait and balance below 24 seconds   ? Time 12   ? Period Weeks   ? Status New   ?  ? PT LONG TERM GOAL #3  ? Title increase LE strength to 4+/5   ? Time 12   ? Period Weeks   ? Status New   ?  ? PT LONG TERM GOAL #4  ? Title increase UE strength to 4+/5   ? Time 12   ?  Period Weeks   ? Status New   ?  ? PT LONG TERM GOAL #5  ? Title report able to make a sandwich without sitting   ? Time 12   ? Period Weeks   ? Status New   ? ?  ?  ? ?  ? ? ? ? ? ? ? ? Plan - 10/24/21 1009   ? ? Clinical Impression Statement Pt enters clinic feeling well. She has little to no active movement of her L ankle so all ankle motions were AAROM. Pt unable to stand and balance without UE support. L foot tends to turn out with gait and all standing activities. No increase in pain during session. Cues to slow down with seated LAQ.   ? Stability/Clinical Decision Making Evolving/Moderate complexity   ? Rehab Potential Fair   ? PT Frequency 2x / week   ? PT Duration 12 weeks   ? PT Treatment/Interventions ADLs/Self Care Home Management;Gait training;Stair training;Functional mobility training;Therapeutic activities;Therapeutic exercise;Balance training;Neuromuscular re-education;Manual techniques;Patient/family education   ? PT Next Visit Plan needs total body work, balance, be careful with the ankle as she cannot feel the feet bilaterally   ? ?  ?  ? ?  ? ? ?Patient will benefit from skilled therapeutic intervention in order to improve the following deficits and impairments:  Abnormal gait, Decreased range of motion, Difficulty walking, Decreased endurance, Pain, Decreased activity tolerance, Decreased balance, Decreased strength, Decreased mobility ? ?Visit Diagnosis: ?Muscle weakness (generalized) ? ?Unsteady gait ? ?Difficulty in walking, not elsewhere classified ? ? ? ? ?Problem List ?Patient Active Problem List  ? Diagnosis Date Noted  ? Closed low lateral malleolus fracture 05/16/2021  ? Gout 05/16/2021  ? Hyperlipemia 05/16/2021  ? Hypertension 05/16/2021  ? Type II diabetes mellitus ([place]) 05/16/2021  ? Spinal stenosis of lumbar region 03/10/2019  ? Cervical myelopathy ([place]) 03/10/2019  ? Peripheral neuropathy 02/02/2019  ? Gait abnormality 02/02/2019  ?  Urinary urgency 02/02/2019  ? Chronic bilateral  low back pain without sciatica 02/02/2019  ? DOE (dyspnea on exertion) 10/05/2017  ? Bruit of right carotid artery 10/05/2017  ? Coronary artery calcification seen on CT scan 10/05/2017  ? Chest pain 10/04/2017  ? Aortic atherosclerosis ([place]) 10/04/2017  ? Charcot foot due to diabetes mellitus ([place]) 11/23/2014  ? ? ?Scot Jun, PTA ?10/24/2021, 10:14 AM ? ?[place] ?[place] [address] ?[place], [place], [address] ?Phone: [phone]   Fax:  [phone] ? ?Name: Audrianna Driskill Goldsmith ?MRN: [id number] ?Date of Birth: 06/13/1946 ? ? ? ?

## 2021-10-31 ENCOUNTER — Encounter: Payer: Self-pay | Admitting: Physical Therapy

## 2021-10-31 ENCOUNTER — Ambulatory Visit: Payer: Medicare Other | Admitting: Physical Therapy

## 2021-10-31 DIAGNOSIS — R262 Difficulty in walking, not elsewhere classified: Secondary | ICD-10-CM

## 2021-10-31 DIAGNOSIS — R2681 Unsteadiness on feet: Secondary | ICD-10-CM

## 2021-10-31 DIAGNOSIS — M6281 Muscle weakness (generalized): Secondary | ICD-10-CM

## 2021-10-31 NOTE — Patient Outreach (Signed)
[facility]) Care Management ? ?10/31/2021 ? ?Pamela Snyder ?[birth year] ?[id number] ? ? ?Received hospital referral from Marthenia Rolling, RN for Pamela Snyder (central) for complex case management. Made correction on 10/31/21 as pcp is embedded sent referral to [facility] for follow up. ? ?Philmore Pali ?[facility] Management Assistant ?[phone] ? ?

## 2021-10-31 NOTE — Therapy (Signed)
[place] ?[place] [address] ?[place], [place], [address] ?Phone: [phone]   Fax:  [phone] ? ?Physical Therapy Treatment ? ?Patient Details  ?Name: Pamela Snyder ?MRN: [id number] ?Date of Birth: 09-08-1945 ?Referring Provider (PT): Alfredo Martinez ? ? ?Encounter Date: 10/31/2021 ? ? PT End of Session - 10/31/21 1446   ? ? Visit Number 3   ? Date for PT Re-Evaluation 01/22/22   ? PT Start Time 1402   ? PT Stop Time 9518   ? PT Time Calculation (min) 45 min   ? Activity Tolerance Patient tolerated treatment well   ? Behavior During Therapy [facility] for tasks assessed/performed   ? ?  ?  ? ?  ? ? ?Past Medical History:  ?Diagnosis Date  ? Anxiety   ? Arthritis   ? "right foot; spine; hands" (11/23/2014)  ? Charcot foot due to diabetes mellitus ([place])   ? Depression   ? GERD (gastroesophageal reflux disease)   ? Gout   ? Hyperlipemia   ? Hypertension   ? Migraine   ? hx  ? Neuropathy   ? Numbness   ? Thyroid goiter 1986  ? Type II diabetes mellitus ([place])   ? ? ?Past Surgical History:  ?Procedure Laterality Date  ? ABDOMINAL HYSTERECTOMY  1999  ? w/BSO  ? ACHILLES TENDON LENGTHENING Right 11/23/2014  ? ACHILLES TENDON LENGTHENING Right 11/23/2014  ? Procedure: RIGHT ACHILLES PERCUTANEOUS TENDON LENGTHENING;  Surgeon: Wylene Simmer, MD;  Location: [place];  Service: Orthopedics;  Laterality: Right;  ? APPENDECTOMY  1963  ? ARTHRODESIS Right 11/23/2014  ? mid foot  ? CARDIAC CATHETERIZATION  08/2004  ? FOOT ARTHRODESIS Right 11/23/2014  ? Procedure: RIGHT MID FOOT ARTHRODESIS;  Surgeon: Wylene Simmer, MD;  Location: [place];  Service: Orthopedics;  Laterality: Right;  ? METATARSAL OSTEOTOMY Right 11/23/2014  ? Procedure: RIGHT MID FOOT OSTEOTOMY;  Surgeon: Wylene Simmer, MD;  Location: [place];  Service: Orthopedics;  Laterality: Right;  ? ORIF ANKLE FRACTURE Left 05/16/2021  ? Procedure: OPEN REDUCTION INTERNAL FIXATION (ORIF) Left ankle lateral malleolus, possible deltoid ligament repair;   Surgeon: Wylene Simmer, MD;  Location: [place];  Service: Orthopedics;  Laterality: Left;  ? OSTEOTOMY Right 11/23/2014  ? mid foot  ? PARTIAL THYMECTOMY  1986  ? ? side  ? TONSILLECTOMY  1954  ? ? ?There were no vitals filed for this visit. ? ? Subjective Assessment - 10/31/21 1401   ? ? Subjective Patient rpeorts that her heat went out and she has been confined to her room with a space heater. She continues to have some chronic pain in her neck and her back due to spinal degeneration.   ? Pertinent History reports HNP in the neck and the low back, reports sciatica and some numbness in the hands.  She had right foot surgery 2016 Charcot foot   ? How long can you stand comfortably? 3-5 minutes   ? How long can you walk comfortably? 50-70 feet   ? Patient Stated Goals walk normally with a rollator, feel stronger and have more indepdnence   ? Currently in Pain? No/denies   ? Pain Onset More than a month ago   ? ?  ?  ? ?  ? ? ? ? ? ? ? ? ? ? ? ? ? ? ? ? ? ? ? ? [place] Adult PT Treatment/Exercise - 10/31/21 0001   ? ?  ? Knee/Hip Exercises: Supine  ? Bridges Strengthening;Both;1  set;10 reps   ? Bridges with Clamshell Strengthening;Both;1 set;10 reps   Red Tband.  ? ?  ?  ? ?  ? ? ? ? ? ? ? ? ? ? PT Education - 10/31/21 1446   ? ? Education Details HEP   ? Person(s) Educated Patient   ? Methods Explanation;Demonstration;Handout   ? Comprehension Returned demonstration;Verbalized understanding   ? ?  ?  ? ?  ? ? ? PT Short Term Goals - 10/31/21 1407   ? ?  ? PT SHORT TERM GOAL #1  ? Title Pt will be I and compliant with HEP   ? Baseline Initiated.   ? Time 3   ? Period Weeks   ? Status On-going   ? ?  ?  ? ?  ? ? ? ? PT Long Term Goals - 10/23/21 1758   ? ?  ? PT LONG TERM GOAL #1  ? Title Pt will improve BERG balance without AD to >18 to show improved balance.   ? Time 12   ? Period Weeks   ? Status New   ?  ? PT LONG TERM GOAL #2  ? Title Pt will improve gait speed and TUG time to show improved gait and balance below 24  seconds   ? Time 12   ? Period Weeks   ? Status New   ?  ? PT LONG TERM GOAL #3  ? Title increase LE strength to 4+/5   ? Time 12   ? Period Weeks   ? Status New   ?  ? PT LONG TERM GOAL #4  ? Title increase UE strength to 4+/5   ? Time 12   ? Period Weeks   ? Status New   ?  ? PT LONG TERM GOAL #5  ? Title report able to make a sandwich without sitting   ? Time 12   ? Period Weeks   ? Status New   ? ?  ?  ? ?  ? ? ? ? ? ? ? ? Plan - 10/31/21 1447   ? ? Clinical Impression Statement Patient reports that her heat went out and she has been largely stuck in her bedroom. Treatment focused on trunk stability and LE strength, initiating HEP. Paiten trequires re-direction at times, but worked hard.   ? Stability/Clinical Decision Making Evolving/Moderate complexity   ? Clinical Decision Making Moderate   ? Rehab Potential Fair   ? PT Frequency 2x / week   ? PT Duration 12 weeks   ? PT Treatment/Interventions ADLs/Self Care Home Management;Gait training;Stair training;Functional mobility training;Therapeutic activities;Therapeutic exercise;Balance training;Neuromuscular re-education;Manual techniques;Patient/family education   ? PT Next Visit Plan needs total body work, balance, be careful with the ankle as she cannot feel the feet bilaterally   ? Consulted and Agree with Plan of Care Patient   ? ?  ?  ? ?  ? ? ?Patient will benefit from skilled therapeutic intervention in order to improve the following deficits and impairments:  Abnormal gait, Decreased range of motion, Difficulty walking, Decreased endurance, Pain, Decreased activity tolerance, Decreased balance, Decreased strength, Decreased mobility ? ?Visit Diagnosis: ?Muscle weakness (generalized) ? ?Unsteady gait ? ?Difficulty in walking, not elsewhere classified ? ? ? ? ?Problem List ?Patient Active Problem List  ? Diagnosis Date Noted  ? Closed low lateral malleolus fracture 05/16/2021  ? Gout 05/16/2021  ? Hyperlipemia 05/16/2021  ? Hypertension 05/16/2021  ? Type  II diabetes mellitus ([place]) 05/16/2021  ?  Spinal stenosis of lumbar region 03/10/2019  ? Cervical myelopathy ([place]) 03/10/2019  ? Peripheral neuropathy 02/02/2019  ? Gait abnormality 02/02/2019  ? Urinary urgency 02/02/2019  ? Chronic bilateral low back pain without sciatica 02/02/2019  ? DOE (dyspnea on exertion) 10/05/2017  ? Bruit of right carotid artery 10/05/2017  ? Coronary artery calcification seen on CT scan 10/05/2017  ? Chest pain 10/04/2017  ? Aortic atherosclerosis ([place]) 10/04/2017  ? Charcot foot due to diabetes mellitus ([place]) 11/23/2014  ? ? ?Marcelina Morel, DPT ?10/31/2021, 4:02 PM ? ?[facility] ?[place] [address] ?[place], [place], [address] ?Phone: [phone]   Fax:  [phone] ? ?Name: Pamela Snyder ?MRN: [id number] ?Date of Birth: August 19, 1945 ? ? ? ?

## 2021-10-31 NOTE — Patient Instructions (Signed)
Access Code: [email or web address] ?URL: [email or web address] ?Date: 10/31/2021 ?Prepared by: Ethel Rana ? ?Exercises ?- Hooklying Clamshell with Resistance  - 1 x daily - 7 x weekly - 1 sets - 10 reps ?- Supine Bridge  - 1 x daily - 7 x weekly - 1 sets - 10 reps ?- Supine Figure 4 Piriformis Stretch  - 1 x daily - 7 x weekly - 3 reps - 20 hold ?- Supine Double Knee to Chest  - 1 x daily - 7 x weekly - 3 reps - 20 hold ?- Seated Heel Raise  - 1 x daily - 7 x weekly - 2 sets - 10 reps ?- Seated Reaching to Side and Across Body  - 1 x daily - 7 x weekly - 3 sets - 10 reps ?- Seated forward bend with straight back.  - 1 x daily - 7 x weekly - 1 sets - 10 reps ?

## 2021-11-05 ENCOUNTER — Ambulatory Visit: Payer: Medicare Other | Attending: Student | Admitting: Physical Therapy

## 2021-11-05 ENCOUNTER — Encounter: Payer: Self-pay | Admitting: Physical Therapy

## 2021-11-05 DIAGNOSIS — M6281 Muscle weakness (generalized): Secondary | ICD-10-CM | POA: Insufficient documentation

## 2021-11-05 DIAGNOSIS — R262 Difficulty in walking, not elsewhere classified: Secondary | ICD-10-CM | POA: Insufficient documentation

## 2021-11-05 DIAGNOSIS — R2681 Unsteadiness on feet: Secondary | ICD-10-CM | POA: Diagnosis not present

## 2021-11-05 NOTE — Therapy (Signed)
[facility] ?[place] [address] ?[place], [place], [address] ?Phone: [phone]   Fax:  [phone] ? ?Physical Therapy Treatment ? ?Patient Details  ?Name: Pamela Snyder ?MRN: [id number] ?Date of Birth: [birth year] ?Referring Provider (PT): Alfredo Martinez ? ? ?Encounter Date: 11/05/2021 ? ? PT End of Session - 11/05/21 1641   ? ? Visit Number 4   ? Date for PT Re-Evaluation 01/22/22   ? PT Start Time 3557   ? PT Stop Time 3220   ? PT Time Calculation (min) 27 min   ? Activity Tolerance Patient tolerated treatment well   ? Behavior During Therapy [facility] for tasks assessed/performed   ? ?  ?  ? ?  ? ? ?Past Medical History:  ?Diagnosis Date  ? Anxiety   ? Arthritis   ? "right foot; spine; hands" (11/23/2014)  ? Charcot foot due to diabetes mellitus ([place])   ? Depression   ? GERD (gastroesophageal reflux disease)   ? Gout   ? Hyperlipemia   ? Hypertension   ? Migraine   ? hx  ? Neuropathy   ? Numbness   ? Thyroid goiter 1986  ? Type II diabetes mellitus ([place])   ? ? ?Past Surgical History:  ?Procedure Laterality Date  ? ABDOMINAL HYSTERECTOMY  1999  ? w/BSO  ? ACHILLES TENDON LENGTHENING Right 11/23/2014  ? ACHILLES TENDON LENGTHENING Right 11/23/2014  ? Procedure: RIGHT ACHILLES PERCUTANEOUS TENDON LENGTHENING;  Surgeon: Wylene Simmer, MD;  Location: [facility];  Service: Orthopedics;  Laterality: Right;  ? APPENDECTOMY  1963  ? ARTHRODESIS Right 11/23/2014  ? mid foot  ? CARDIAC CATHETERIZATION  08/2004  ? FOOT ARTHRODESIS Right 11/23/2014  ? Procedure: RIGHT MID FOOT ARTHRODESIS;  Surgeon: Wylene Simmer, MD;  Location: [facility];  Service: Orthopedics;  Laterality: Right;  ? METATARSAL OSTEOTOMY Right 11/23/2014  ? Procedure: RIGHT MID FOOT OSTEOTOMY;  Surgeon: Wylene Simmer, MD;  Location: [facility];  Service: Orthopedics;  Laterality: Right;  ? ORIF ANKLE FRACTURE Left 05/16/2021  ? Procedure: OPEN REDUCTION INTERNAL FIXATION (ORIF) Left ankle lateral malleolus, possible deltoid ligament repair;   Surgeon: Wylene Simmer, MD;  Location: [facility];  Service: Orthopedics;  Laterality: Left;  ? OSTEOTOMY Right 11/23/2014  ? mid foot  ? PARTIAL THYMECTOMY  1986  ? ? side  ? TONSILLECTOMY  1954  ? ? ?There were no vitals filed for this visit. ? ? Subjective Assessment - 11/05/21 1617   ? ? Subjective "I am ok"   ? Currently in Pain? No/denies   ? ?  ?  ? ?  ? ? ? ? ? ? ? ? ? ? ? ? ? ? ? ? ? ? ? ? [facility] Adult PT Treatment/Exercise - 11/05/21 0001   ? ?  ? High Level Balance  ? High Level Balance Activities Side stepping;Backward walking   ? High Level Balance Comments HHA x2   ?  ? Knee/Hip Exercises: Aerobic  ? Nustep level 5 x 6 minutes   ?  ? Knee/Hip Exercises: Standing  ? Other Standing Knee Exercises Standing marches w/ RW 2x10 second set 3lb cuff   ? Other Standing Knee Exercises Hip abd 2x10 w/RW   ?  ? Knee/Hip Exercises: Seated  ? Long [employer] Strengthening;Both;2 sets;15 reps   ? Long Arc Quad Weight 3 lbs.   ? Sit to Sand with UE support;1 set;10 reps   to RW  ? ?  ?  ? ?  ? ? ? ? ? ? ? ? ? ? ? ?  PT Short Term Goals - 10/31/21 1407   ? ?  ? PT SHORT TERM GOAL #1  ? Title Pt will be I and compliant with HEP   ? Baseline Initiated.   ? Time 3   ? Period Weeks   ? Status On-going   ? ?  ?  ? ?  ? ? ? ? PT Long Term Goals - 10/23/21 1758   ? ?  ? PT LONG TERM GOAL #1  ? Title Pt will improve BERG balance without AD to >18 to show improved balance.   ? Time 12   ? Period Weeks   ? Status New   ?  ? PT LONG TERM GOAL #2  ? Title Pt will improve gait speed and TUG time to show improved gait and balance below 24 seconds   ? Time 12   ? Period Weeks   ? Status New   ?  ? PT LONG TERM GOAL #3  ? Title increase LE strength to 4+/5   ? Time 12   ? Period Weeks   ? Status New   ?  ? PT LONG TERM GOAL #4  ? Title increase UE strength to 4+/5   ? Time 12   ? Period Weeks   ? Status New   ?  ? PT LONG TERM GOAL #5  ? Title report able to make a sandwich without sitting   ? Time 12   ? Period Weeks   ? Status New   ? ?  ?  ? ?   ? ? ? ? ? ? ? ? Plan - 11/05/21 1643   ? ? Clinical Impression Statement Pt ~ 14 minutes late for today's session. Focus step on LE strength and balance. Cue needed to increase bilateral hip flexion with standing marches. Some scissoring of LE L>R with backwards walking causing some instability. Some instability noted with standing hip abduction.   ? Stability/Clinical Decision Making Evolving/Moderate complexity   ? Rehab Potential Fair   ? PT Frequency 2x / week   ? PT Duration 12 weeks   ? PT Treatment/Interventions ADLs/Self Care Home Management;Gait training;Stair training;Functional mobility training;Therapeutic activities;Therapeutic exercise;Balance training;Neuromuscular re-education;Manual techniques;Patient/family education   ? PT Next Visit Plan needs total body work, balance, be careful with the ankle as she cannot feel the feet bilaterally   ? ?  ?  ? ?  ? ? ?Patient will benefit from skilled therapeutic intervention in order to improve the following deficits and impairments:  Abnormal gait, Decreased range of motion, Difficulty walking, Decreased endurance, Pain, Decreased activity tolerance, Decreased balance, Decreased strength, Decreased mobility ? ?Visit Diagnosis: ?Muscle weakness (generalized) ? ?Unsteady gait ? ?Difficulty in walking, not elsewhere classified ? ? ? ? ?Problem List ?Patient Active Problem List  ? Diagnosis Date Noted  ? Closed low lateral malleolus fracture 05/16/2021  ? Gout 05/16/2021  ? Hyperlipemia 05/16/2021  ? Hypertension 05/16/2021  ? Type II diabetes mellitus ([place]) 05/16/2021  ? Spinal stenosis of lumbar region 03/10/2019  ? Cervical myelopathy ([place]) 03/10/2019  ? Peripheral neuropathy 02/02/2019  ? Gait abnormality 02/02/2019  ? Urinary urgency 02/02/2019  ? Chronic bilateral low back pain without sciatica 02/02/2019  ? DOE (dyspnea on exertion) 10/05/2017  ? Bruit of right carotid artery 10/05/2017  ? Coronary artery calcification seen on CT scan 10/05/2017  ? Chest  pain 10/04/2017  ? Aortic atherosclerosis ([place]) 10/04/2017  ? Charcot foot due to diabetes mellitus ([place]) 11/23/2014  ? ? ?  Scot Jun, PTA ?11/05/2021, 4:45 PM ? ?[place] ?[place] [address] ?[place], [place], [address] ?Phone: [phone]   Fax:  [phone] ? ?Name: Pamela Snyder ?MRN: [id number] ?Date of Birth: 09/18/1945 ? ? ? ?

## 2021-11-07 ENCOUNTER — Ambulatory Visit: Payer: Medicare Other | Admitting: Physical Therapy

## 2021-11-07 ENCOUNTER — Encounter: Payer: Self-pay | Admitting: Physical Therapy

## 2021-11-07 DIAGNOSIS — M6281 Muscle weakness (generalized): Secondary | ICD-10-CM | POA: Diagnosis not present

## 2021-11-07 DIAGNOSIS — R2681 Unsteadiness on feet: Secondary | ICD-10-CM

## 2021-11-07 DIAGNOSIS — R262 Difficulty in walking, not elsewhere classified: Secondary | ICD-10-CM

## 2021-11-07 NOTE — Therapy (Signed)
[facility] ?[place] [address] ?[place], [place], [address] ?Phone: [phone]   Fax:  [phone] ? ?Physical Therapy Treatment ? ?Patient Details  ?Name: Pamela Snyder ?MRN: [id number] ?Date of Birth: [birth year] ?Referring Provider (PT): Pamela Snyder ? ? ?Encounter Date: 11/07/2021 ? ? PT End of Session - 11/07/21 1746   ? ? Visit Number 5   ? Date for PT Re-Evaluation 01/22/22   ? PT Start Time 8502   ? PT Stop Time 1742   ? PT Time Calculation (min) 44 min   ? Activity Tolerance Patient tolerated treatment well   ? Behavior During Therapy [facility] for tasks assessed/performed   ? ?  ?  ? ?  ? ? ?Past Medical History:  ?Diagnosis Date  ? Anxiety   ? Arthritis   ? "right foot; spine; hands" (11/23/2014)  ? Charcot foot due to diabetes mellitus ([place])   ? Depression   ? GERD (gastroesophageal reflux disease)   ? Gout   ? Hyperlipemia   ? Hypertension   ? Migraine   ? hx  ? Neuropathy   ? Numbness   ? Thyroid goiter 1986  ? Type II diabetes mellitus ([place])   ? ? ?Past Surgical History:  ?Procedure Laterality Date  ? ABDOMINAL HYSTERECTOMY  1999  ? w/BSO  ? ACHILLES TENDON LENGTHENING Right 11/23/2014  ? ACHILLES TENDON LENGTHENING Right 11/23/2014  ? Procedure: RIGHT ACHILLES PERCUTANEOUS TENDON LENGTHENING;  Surgeon: Wylene Simmer, MD;  Location: [facility];  Service: Orthopedics;  Laterality: Right;  ? APPENDECTOMY  1963  ? ARTHRODESIS Right 11/23/2014  ? mid foot  ? CARDIAC CATHETERIZATION  08/2004  ? FOOT ARTHRODESIS Right 11/23/2014  ? Procedure: RIGHT MID FOOT ARTHRODESIS;  Surgeon: Wylene Simmer, MD;  Location: [facility];  Service: Orthopedics;  Laterality: Right;  ? METATARSAL OSTEOTOMY Right 11/23/2014  ? Procedure: RIGHT MID FOOT OSTEOTOMY;  Surgeon: Wylene Simmer, MD;  Location: [facility];  Service: Orthopedics;  Laterality: Right;  ? ORIF ANKLE FRACTURE Left 05/16/2021  ? Procedure: OPEN REDUCTION INTERNAL FIXATION (ORIF) Left ankle lateral malleolus, possible deltoid ligament repair;   Surgeon: Wylene Simmer, MD;  Location: [facility];  Service: Orthopedics;  Laterality: Left;  ? OSTEOTOMY Right 11/23/2014  ? mid foot  ? PARTIAL THYMECTOMY  1986  ? ? side  ? TONSILLECTOMY  1954  ? ? ?There were no vitals filed for this visit. ? ? Subjective Assessment - 11/07/21 1701   ? ? Subjective I am OK, my furnace is broken and they will have it fixed by Tuesday. I have issues with my neck that's making me lose my fine motor skills, my low back is screwy too. I'd like to work on strength today.   ? Pertinent History reports HNP in the neck and the low back, reports sciatica and some numbness in the hands.  She had right foot surgery 2016 Charcot foot   ? Patient Stated Goals walk normally with a rollator, feel stronger and have more indepdnence   ? Currently in Pain? No/denies   ? ?  ?  ? ?  ? ? ? ? ? ? ? ? ? ? ? ? ? ? ? ? ? ? ? ? [facility] Adult PT Treatment/Exercise - 11/07/21 0001   ? ?  ? Knee/Hip Exercises: Aerobic  ? Nustep level 5 x 6 minutes   BLEs/BUEs  ?  ? Knee/Hip Exercises: Seated  ? Long [employer] Strengthening;Both;1 set;15 reps   ? Long  Arc Quad Limitations red TB   ? Other Seated Knee/Hip Exercises ankle DF red TB 1x10 b   ? Sit to Sand without UE support;1 set;5 reps   4#, Min-ModA for balance/motor contorl  ? ?  ?  ? ?  ? ? ? ? ? ? Balance Exercises - 11/07/21 0001   ? ?  ? Balance Exercises: Standing  ? Standing Eyes Opened Narrow base of support (BOS);Solid surface;30 secs;2 reps   ? Partial Tandem Stance Eyes open;3 reps;10 secs   ? Other Standing Exercises low level external perturbation training- correcting anteriorly after posterior bias x5   ? ?  ?  ? ?  ? ? ? ? ? PT Education - 11/07/21 1745   ? ? Education Details exercise form and purpose   ? Person(s) Educated Patient   ? Methods Explanation   ? Comprehension Verbalized understanding   ? ?  ?  ? ?  ? ? ? PT Short Term Goals - 10/31/21 1407   ? ?  ? PT SHORT TERM GOAL #1  ? Title Pt will be I and compliant with HEP   ? Baseline Initiated.   ?  Time 3   ? Period Weeks   ? Status On-going   ? ?  ?  ? ?  ? ? ? ? PT Long Term Goals - 10/23/21 1758   ? ?  ? PT LONG TERM GOAL #1  ? Title Pt will improve BERG balance without AD to >18 to show improved balance.   ? Time 12   ? Period Weeks   ? Status New   ?  ? PT LONG TERM GOAL #2  ? Title Pt will improve gait speed and TUG time to show improved gait and balance below 24 seconds   ? Time 12   ? Period Weeks   ? Status New   ?  ? PT LONG TERM GOAL #3  ? Title increase LE strength to 4+/5   ? Time 12   ? Period Weeks   ? Status New   ?  ? PT LONG TERM GOAL #4  ? Title increase UE strength to 4+/5   ? Time 12   ? Period Weeks   ? Status New   ?  ? PT LONG TERM GOAL #5  ? Title report able to make a sandwich without sitting   ? Time 12   ? Period Weeks   ? Status New   ? ?  ?  ? ?  ? ? ? ? ? ? ? ? Plan - 11/07/21 1746   ? ? Clinical Impression Statement Pamela Snyder arrives today doing OK, we worked on Cytogeneticist, activity tolerance, and balance today. Had to take multiple breaks throughout session due to cramps and mm pain, also fatigue. Needed up to Min-ModA for challenging tasks. Did OK today, will continue to progress as able and tolerated.   ? Stability/Clinical Decision Making Evolving/Moderate complexity   ? Clinical Decision Making Moderate   ? Rehab Potential Fair   ? PT Frequency 2x / week   ? PT Duration 12 weeks   ? PT Treatment/Interventions ADLs/Self Care Home Management;Gait training;Stair training;Functional mobility training;Therapeutic activities;Therapeutic exercise;Balance training;Neuromuscular re-education;Manual techniques;Patient/family education   ? PT Next Visit Plan needs total body work, balance, be careful with the ankle as she cannot feel the feet bilaterally   ? Consulted and Agree with Plan of Care Patient   ? ?  ?  ? ?  ? ? ?  Patient will benefit from skilled therapeutic intervention in order to improve the following deficits and impairments:  Abnormal gait, Decreased range  of motion, Difficulty walking, Decreased endurance, Pain, Decreased activity tolerance, Decreased balance, Decreased strength, Decreased mobility ? ?Visit Diagnosis: ?Muscle weakness (generalized) ? ?Difficulty in walking, not elsewhere classified ? ?Unsteady gait ? ? ? ? ?Problem List ?Patient Active Problem List  ? Diagnosis Date Noted  ? Closed low lateral malleolus fracture 05/16/2021  ? Gout 05/16/2021  ? Hyperlipemia 05/16/2021  ? Hypertension 05/16/2021  ? Type II diabetes mellitus ([place]) 05/16/2021  ? Spinal stenosis of lumbar region 03/10/2019  ? Cervical myelopathy ([place]) 03/10/2019  ? Peripheral neuropathy 02/02/2019  ? Gait abnormality 02/02/2019  ? Urinary urgency 02/02/2019  ? Chronic bilateral low back pain without sciatica 02/02/2019  ? DOE (dyspnea on exertion) 10/05/2017  ? Bruit of right carotid artery 10/05/2017  ? Coronary artery calcification seen on CT scan 10/05/2017  ? Chest pain 10/04/2017  ? Aortic atherosclerosis ([place]) 10/04/2017  ? Charcot foot due to diabetes mellitus ([place]) 11/23/2014  ? ?Pamela Snyder U PT, DPT, PN2  ? ?Supplemental Physical Therapist ?[facility]  ? ? ? ? ? ?[place] ?[place] ?[address] ?[place], [place], [address] ?Phone: [phone]   Fax:  [phone] ? ?Name: Kaylia Winborne [place] ?MRN: [id number] ?Date of Birth: 08-11-1945 ? ? ? ?

## 2021-11-12 ENCOUNTER — Ambulatory Visit: Payer: Medicare Other | Admitting: Physical Therapy

## 2021-11-12 ENCOUNTER — Encounter: Payer: Self-pay | Admitting: Physical Therapy

## 2021-11-12 DIAGNOSIS — R2681 Unsteadiness on feet: Secondary | ICD-10-CM

## 2021-11-12 DIAGNOSIS — M6281 Muscle weakness (generalized): Secondary | ICD-10-CM | POA: Diagnosis not present

## 2021-11-12 DIAGNOSIS — R262 Difficulty in walking, not elsewhere classified: Secondary | ICD-10-CM | POA: Diagnosis not present

## 2021-11-12 NOTE — Therapy (Signed)
[facility] ?[place] ?[address] ?[place], [place], [address] ?Phone: [phone]   Fax:  [phone] ? ?Physical Therapy Treatment ? ?Patient Details  ?Name: Pamela Snyder ?MRN: [id number] ?Date of Birth: 1946/04/02 ?Referring Provider (PT): Alfredo Martinez ? ? ?Encounter Date: 11/12/2021 ? ? PT End of Session - 11/12/21 1705   ? ? Visit Number 6   ? Date for PT Re-Evaluation 01/22/22   ? PT Start Time [phone]   ? PT Stop Time 8921   ? PT Time Calculation (min) 38 min   ? Activity Tolerance Patient tolerated treatment well   ? Behavior During Therapy [facility] for tasks assessed/performed   ? ?  ?  ? ?  ? ? ?Past Medical History:  ?Diagnosis Date  ? Anxiety   ? Arthritis   ? "right foot; spine; hands" (11/23/2014)  ? Charcot foot due to diabetes mellitus ([place])   ? Depression   ? GERD (gastroesophageal reflux disease)   ? Gout   ? Hyperlipemia   ? Hypertension   ? Migraine   ? hx  ? Neuropathy   ? Numbness   ? Thyroid goiter 1986  ? Type II diabetes mellitus ([place])   ? ? ?Past Surgical History:  ?Procedure Laterality Date  ? ABDOMINAL HYSTERECTOMY  1999  ? w/BSO  ? ACHILLES TENDON LENGTHENING Right 11/23/2014  ? ACHILLES TENDON LENGTHENING Right 11/23/2014  ? Procedure: RIGHT ACHILLES PERCUTANEOUS TENDON LENGTHENING;  Surgeon: Wylene Simmer, MD;  Location: [facility];  Service: Orthopedics;  Laterality: Right;  ? APPENDECTOMY  1963  ? ARTHRODESIS Right 11/23/2014  ? mid foot  ? CARDIAC CATHETERIZATION  08/2004  ? FOOT ARTHRODESIS Right 11/23/2014  ? Procedure: RIGHT MID FOOT ARTHRODESIS;  Surgeon: Wylene Simmer, MD;  Location: [facility];  Service: Orthopedics;  Laterality: Right;  ? METATARSAL OSTEOTOMY Right 11/23/2014  ? Procedure: RIGHT MID FOOT OSTEOTOMY;  Surgeon: Wylene Simmer, MD;  Location: [facility];  Service: Orthopedics;  Laterality: Right;  ? ORIF ANKLE FRACTURE Left 05/16/2021  ? Procedure: OPEN REDUCTION INTERNAL FIXATION (ORIF) Left ankle lateral malleolus, possible deltoid ligament repair;   Surgeon: Wylene Simmer, MD;  Location: [facility];  Service: Orthopedics;  Laterality: Left;  ? OSTEOTOMY Right 11/23/2014  ? mid foot  ? PARTIAL THYMECTOMY  1986  ? ? side  ? TONSILLECTOMY  1954  ? ? ?There were no vitals filed for this visit. ? ? Subjective Assessment - 11/12/21 1635   ? ? Subjective Doing OK but not feeling well today, they have been testing and running a heater in my house and there are a lot of smells and fumes. Dizzy and nauseous a couple days ago but better now   ? Pertinent History reports HNP in the neck and the low back, reports sciatica and some numbness in the hands.  She had right foot surgery 2016 Charcot foot   ? Patient Stated Goals walk normally with a rollator, feel stronger and have more indepdnence   ? Currently in Pain? Yes   ? Pain Score 3    ? Pain Location Other (Comment)   generalized soreness and aching  ? ?  ?  ? ?  ? ? ? ? ? ? ? ? ? ? ? ? ? ? ? ? ? ? ? ? [facility] Adult PT Treatment/Exercise - 11/12/21 0001   ? ?  ? Knee/Hip Exercises: Aerobic  ? Nustep level 5 x 6 minutes   BLEs/BUEs  ?  ? Knee/Hip  Exercises: Standing  ? Knee Flexion Both;1 set;10 reps   ? Knee Flexion Limitations 2#   ? Other Standing Knee Exercises standing marches with RW 2# each LE 1x20   ? Other Standing Knee Exercises standing hip extensions 2# 1x10 B   ?  ? Knee/Hip Exercises: Supine  ? Short [employer] Both;1 set;10 reps   ? Short [employer] Limitations 2#   ? [place] Both;1 set;10 reps   ? Straight Leg Raises Both;1 set;10 reps   ? ?  ?  ? ?  ? ? ? ? ? ? ? ? ? ? PT Education - 11/12/21 1705   ? ? Education Details exercise form/purpose   ? Person(s) Educated Patient   ? Methods Explanation   ? Comprehension Verbalized understanding   ? ?  ?  ? ?  ? ? ? PT Short Term Goals - 10/31/21 1407   ? ?  ? PT SHORT TERM GOAL #1  ? Title Pt will be I and compliant with HEP   ? Baseline Initiated.   ? Time 3   ? Period Weeks   ? Status On-going   ? ?  ?  ? ?  ? ? ? ? PT Long Term Goals - 10/23/21 1758   ? ?  ? PT  LONG TERM GOAL #1  ? Title Pt will improve BERG balance without AD to >18 to show improved balance.   ? Time 12   ? Period Weeks   ? Status New   ?  ? PT LONG TERM GOAL #2  ? Title Pt will improve gait speed and TUG time to show improved gait and balance below 24 seconds   ? Time 12   ? Period Weeks   ? Status New   ?  ? PT LONG TERM GOAL #3  ? Title increase LE strength to 4+/5   ? Time 12   ? Period Weeks   ? Status New   ?  ? PT LONG TERM GOAL #4  ? Title increase UE strength to 4+/5   ? Time 12   ? Period Weeks   ? Status New   ?  ? PT LONG TERM GOAL #5  ? Title report able to make a sandwich without sitting   ? Time 12   ? Period Weeks   ? Status New   ? ?  ?  ? ?  ? ? ? ? ? ? ? ? Plan - 11/12/21 1705   ? ? Clinical Impression Statement Pamela Snyder arrives today doing OK, she was really sore after last session so we adjusted and tried some less intense but still novel activities today, really tried to build what strength we could. Needed some extra rest breaks due to not feeling well in general which did limit how much we were able to do today. Vitals WNL on RA. Hopefully she will be feeling a little better next time and we can increase intensity of exercise again.   ? Stability/Clinical Decision Making Evolving/Moderate complexity   ? Clinical Decision Making Moderate   ? Rehab Potential Fair   ? PT Frequency 2x / week   ? PT Duration 12 weeks   ? PT Treatment/Interventions ADLs/Self Care Home Management;Gait training;Stair training;Functional mobility training;Therapeutic activities;Therapeutic exercise;Balance training;Neuromuscular re-education;Manual techniques;Patient/family education   ? PT Next Visit Plan needs total body work, balance, be careful with the ankle as she cannot feel the feet bilaterally   ? Consulted and  Agree with Plan of Care Patient   ? ?  ?  ? ?  ? ? ?Patient will benefit from skilled therapeutic intervention in order to improve the following deficits and impairments:  Abnormal gait,  Decreased range of motion, Difficulty walking, Decreased endurance, Pain, Decreased activity tolerance, Decreased balance, Decreased strength, Decreased mobility ? ?Visit Diagnosis: ?Muscle weakness (generalized) ? ?Unsteady gait ? ?Difficulty in walking, not elsewhere classified ? ? ? ? ?Problem List ?Patient Active Problem List  ? Diagnosis Date Noted  ? Closed low lateral malleolus fracture 05/16/2021  ? Gout 05/16/2021  ? Hyperlipemia 05/16/2021  ? Hypertension 05/16/2021  ? Type II diabetes mellitus ([place]) 05/16/2021  ? Spinal stenosis of lumbar region 03/10/2019  ? Cervical myelopathy ([place]) 03/10/2019  ? Peripheral neuropathy 02/02/2019  ? Gait abnormality 02/02/2019  ? Urinary urgency 02/02/2019  ? Chronic bilateral low back pain without sciatica 02/02/2019  ? DOE (dyspnea on exertion) 10/05/2017  ? Bruit of right carotid artery 10/05/2017  ? Coronary artery calcification seen on CT scan 10/05/2017  ? Chest pain 10/04/2017  ? Aortic atherosclerosis ([place]) 10/04/2017  ? Charcot foot due to diabetes mellitus ([place]) 11/23/2014  ? ?Geriann Lafont U PT, DPT, PN2  ? ?Supplemental Physical Therapist ?[place] [facility]  ? ? ? ? ? ?[place] ?[address] ?[place], [place], [address] ?Phone: [phone]   Fax:  [phone] ? ?Name: Pamela Snyder ?MRN: [id number] ?Date of Birth: Nov 14, 1945 ? ? ? ?

## 2021-11-14 ENCOUNTER — Ambulatory Visit: Payer: Medicare Other | Admitting: Physical Therapy

## 2021-11-14 DIAGNOSIS — H8110 Benign paroxysmal vertigo, unspecified ear: Secondary | ICD-10-CM | POA: Diagnosis not present

## 2021-11-14 DIAGNOSIS — J019 Acute sinusitis, unspecified: Secondary | ICD-10-CM | POA: Diagnosis not present

## 2021-11-19 ENCOUNTER — Encounter: Payer: Self-pay | Admitting: Physical Therapy

## 2021-11-19 ENCOUNTER — Ambulatory Visit: Payer: Medicare Other | Admitting: Physical Therapy

## 2021-11-19 DIAGNOSIS — R2681 Unsteadiness on feet: Secondary | ICD-10-CM

## 2021-11-19 DIAGNOSIS — M6281 Muscle weakness (generalized): Secondary | ICD-10-CM | POA: Diagnosis not present

## 2021-11-19 DIAGNOSIS — R262 Difficulty in walking, not elsewhere classified: Secondary | ICD-10-CM

## 2021-11-19 NOTE — Therapy (Signed)
[facility] [address] ?[place], [place], [address] ?Phone: [phone]   Fax:  [phone] ? ?Physical Therapy Treatment ? ?Patient Details  ?Name: Pamela Snyder ?MRN: [id number] ?Date of Birth: 1945/10/13 ?Referring Provider (PT): Alfredo Martinez ? ? ?Encounter Date: 11/19/2021 ? ? PT End of Session - 11/19/21 1802   ? ? Visit Number 7   ? Date for PT Re-Evaluation 01/22/22   ? PT Start Time 1540   ? PT Stop Time 1800   ? PT Time Calculation (min) 45 min   ? Activity Tolerance Patient tolerated treatment well   ? Behavior During Therapy [facility] for tasks assessed/performed   ? ?  ?  ? ?  ? ? ?Past Medical History:  ?Diagnosis Date  ? Anxiety   ? Arthritis   ? "right foot; spine; hands" (11/23/2014)  ? Charcot foot due to diabetes mellitus ([place])   ? Depression   ? GERD (gastroesophageal reflux disease)   ? Gout   ? Hyperlipemia   ? Hypertension   ? Migraine   ? hx  ? Neuropathy   ? Numbness   ? Thyroid goiter 1986  ? Type II diabetes mellitus ([place])   ? ? ?Past Surgical History:  ?Procedure Laterality Date  ? ABDOMINAL HYSTERECTOMY  1999  ? w/BSO  ? ACHILLES TENDON LENGTHENING Right 11/23/2014  ? ACHILLES TENDON LENGTHENING Right 11/23/2014  ? Procedure: RIGHT ACHILLES PERCUTANEOUS TENDON LENGTHENING;  Surgeon: Wylene Simmer, MD;  Location: [facility];  Service: Orthopedics;  Laterality: Right;  ? APPENDECTOMY  1963  ? ARTHRODESIS Right 11/23/2014  ? mid foot  ? CARDIAC CATHETERIZATION  08/2004  ? FOOT ARTHRODESIS Right 11/23/2014  ? Procedure: RIGHT MID FOOT ARTHRODESIS;  Surgeon: Wylene Simmer, MD;  Location: [facility];  Service: Orthopedics;  Laterality: Right;  ? METATARSAL OSTEOTOMY Right 11/23/2014  ? Procedure: RIGHT MID FOOT OSTEOTOMY;  Surgeon: Wylene Simmer, MD;  Location: [facility];  Service: Orthopedics;  Laterality: Right;  ? ORIF ANKLE FRACTURE Left 05/16/2021  ? Procedure: OPEN REDUCTION INTERNAL FIXATION (ORIF) Left ankle lateral malleolus, possible deltoid ligament repair;   Surgeon: Wylene Simmer, MD;  Location: [facility];  Service: Orthopedics;  Laterality: Left;  ? OSTEOTOMY Right 11/23/2014  ? mid foot  ? PARTIAL THYMECTOMY  1986  ? ? side  ? TONSILLECTOMY  1954  ? ? ?There were no vitals filed for this visit. ? ? Subjective Assessment - 11/19/21 1716   ? ? Subjective Reports still dizzy, MD feels it was just allergy, she also reports that she is sleeping a lot and not doing much   ? Currently in Pain? Yes   ? Pain Score 3    ? Pain Location --   feet and hands  ? ?  ?  ? ?  ? ? ? ? ? ? ? ? ? ? ? ? ? ? ? ? ? ? ? ? [facility] Adult PT Treatment/Exercise - 11/19/21 0001   ? ?  ? Ambulation/Gait  ? Gait Comments with FWW 220 feet without rest, at the end really started to have leg fatigue and left groin pain, then after a rest did walk outside around the [place] about 200 feet,   ?  ? Knee/Hip Exercises: Aerobic  ? Nustep level 5 x 6 minutes   ?  ? Knee/Hip Exercises: Standing  ? Other Standing Knee Exercises hip extension and abduction 2.5# 2x10 each   ?  ? Knee/Hip Exercises: Seated  ?  Long [employer] Strengthening;Both;2 sets;10 reps   ? Long Arc Quad Weight 3 lbs.   ? Other Seated Knee/Hip Exercises red tband rows and extensions 2x10   ? Marching Both;2 sets;10 reps   ? Marching Weights 3 lbs.   ? Hamstring Curl Both;2 sets;10 reps   ? Hamstring Limitations RED T BAND   ?  ? Ankle Exercises: Seated  ? Other Seated Ankle Exercises ankle sit fit PF/DF, inv/eversion   ? ?  ?  ? ?  ? ? ? ? ? ? ? ? ? ? ? ? PT Short Term Goals - 10/31/21 1407   ? ?  ? PT SHORT TERM GOAL #1  ? Title Pt will be I and compliant with HEP   ? Baseline Initiated.   ? Time 3   ? Period Weeks   ? Status On-going   ? ?  ?  ? ?  ? ? ? ? PT Long Term Goals - 11/19/21 1805   ? ?  ? PT LONG TERM GOAL #1  ? Title Pt will improve BERG balance without AD to >18 to show improved balance.   ? Status On-going   ?  ? PT LONG TERM GOAL #2  ? Title Pt will improve gait speed and TUG time to show improved gait and balance below 24 seconds    ? Status On-going   ?  ? PT LONG TERM GOAL #3  ? Title increase LE strength to 4+/5   ? Status On-going   ?  ? PT LONG TERM GOAL #4  ? Title increase UE strength to 4+/5   ? Status On-going   ? ?  ?  ? ?  ? ? ? ? ? ? ? ? Plan - 11/19/21 1802   ? ? Clinical Impression Statement reports feeling better today than last time, still reporting dizzy.  We did a little more walking today, her legs get fatigued ata bout 200 feet.  Mild SOB.  Has very poor motions at the ankles and at 200 feet ambulation the ankles and feet begin to flop   ? PT Next Visit Plan needs total body work, balance, be careful with the ankle as she cannot feel the feet bilaterally   ? Consulted and Agree with Plan of Care Patient   ? ?  ?  ? ?  ? ? ?Patient will benefit from skilled therapeutic intervention in order to improve the following deficits and impairments:  Abnormal gait, Decreased range of motion, Difficulty walking, Decreased endurance, Pain, Decreased activity tolerance, Decreased balance, Decreased strength, Decreased mobility ? ?Visit Diagnosis: ?Muscle weakness (generalized) ? ?Unsteady gait ? ?Difficulty in walking, not elsewhere classified ? ? ? ? ?Problem List ?Patient Active Problem List  ? Diagnosis Date Noted  ? Closed low lateral malleolus fracture 05/16/2021  ? Gout 05/16/2021  ? Hyperlipemia 05/16/2021  ? Hypertension 05/16/2021  ? Type II diabetes mellitus ([place]) 05/16/2021  ? Spinal stenosis of lumbar region 03/10/2019  ? Cervical myelopathy ([place]) 03/10/2019  ? Peripheral neuropathy 02/02/2019  ? Gait abnormality 02/02/2019  ? Urinary urgency 02/02/2019  ? Chronic bilateral low back pain without sciatica 02/02/2019  ? DOE (dyspnea on exertion) 10/05/2017  ? Bruit of right carotid artery 10/05/2017  ? Coronary artery calcification seen on CT scan 10/05/2017  ? Chest pain 10/04/2017  ? Aortic atherosclerosis ([place]) 10/04/2017  ? Charcot foot due to diabetes mellitus ([place]) 11/23/2014  ? ? Sumner Boast, PT ?11/19/2021, 6:06  PM ? ?  [facility] ?[place] ?[address] ?[place], [place], [address] ?Phone: [phone]   Fax:  [phone] ? ?Name: Pamela Snyder ?MRN: [id number] ?Date of Birth: 06-Feb-1946 ? ? ? ?

## 2021-11-21 ENCOUNTER — Encounter: Payer: Self-pay | Admitting: Physical Therapy

## 2021-11-21 ENCOUNTER — Ambulatory Visit: Payer: Medicare Other | Admitting: Physical Therapy

## 2021-11-21 DIAGNOSIS — R2681 Unsteadiness on feet: Secondary | ICD-10-CM

## 2021-11-21 DIAGNOSIS — R262 Difficulty in walking, not elsewhere classified: Secondary | ICD-10-CM | POA: Diagnosis not present

## 2021-11-21 DIAGNOSIS — M6281 Muscle weakness (generalized): Secondary | ICD-10-CM

## 2021-11-21 DIAGNOSIS — S8262XA Displaced fracture of lateral malleolus of left fibula, initial encounter for closed fracture: Secondary | ICD-10-CM | POA: Diagnosis not present

## 2021-11-21 NOTE — Therapy (Signed)
[facility] [place], [place], [address] Phone: [phone]   Fax:  [phone]  Physical Therapy Treatment  Patient Details  Name: Pamela Snyder MRN: [id number] Date of Birth: 07-Aug-1945 Referring Provider (PT): Alfredo Martinez   Encounter Date: 11/21/2021   PT End of Session - 11/21/21 1709     Visit Number 8    Date for PT Re-Evaluation 01/22/22    PT Start Time 6073    PT Stop Time 1755    PT Time Calculation (min) 50 min    Activity Tolerance Patient tolerated treatment well    Behavior During Therapy [facility] for tasks assessed/performed             Past Medical History:  Diagnosis Date   Anxiety    Arthritis    "right foot; spine; hands" (11/23/2014)   Charcot foot due to diabetes mellitus ([place])    Depression    GERD (gastroesophageal reflux disease)    Gout    Hyperlipemia    Hypertension    Migraine    hx   Neuropathy    Numbness    Thyroid goiter 1986   Type II diabetes mellitus ([place])     Past Surgical History:  Procedure Laterality Date   ABDOMINAL HYSTERECTOMY  1999   w/BSO   ACHILLES TENDON LENGTHENING Right 11/23/2014   ACHILLES TENDON LENGTHENING Right 11/23/2014   Procedure: RIGHT ACHILLES PERCUTANEOUS TENDON LENGTHENING;  Surgeon: Wylene Simmer, MD;  Location: [facility];  Service: Orthopedics;  Laterality: Right;   [place] Right 11/23/2014   mid foot   CARDIAC CATHETERIZATION  08/2004   FOOT ARTHRODESIS Right 11/23/2014   Procedure: RIGHT MID FOOT ARTHRODESIS;  Surgeon: Wylene Simmer, MD;  Location: [facility];  Service: Orthopedics;  Laterality: Right;   METATARSAL OSTEOTOMY Right 11/23/2014   Procedure: RIGHT MID FOOT OSTEOTOMY;  Surgeon: Wylene Simmer, MD;  Location: [facility];  Service: Orthopedics;  Laterality: Right;   ORIF ANKLE FRACTURE Left 05/16/2021   Procedure: OPEN REDUCTION INTERNAL FIXATION (ORIF) Left ankle lateral malleolus, possible deltoid ligament repair;   Surgeon: Wylene Simmer, MD;  Location: [facility];  Service: Orthopedics;  Laterality: Left;   OSTEOTOMY Right 11/23/2014   mid foot   PARTIAL THYMECTOMY  1986   ? side   TONSILLECTOMY  1954    There were no vitals filed for this visit.   Subjective Assessment - 11/21/21 1710     Subjective Patient saw the MD, "ankle is great" does not need to check back with MD    Currently in Pain? Yes    Pain Score 3     Pain Location Toe (Comment which one)    Pain Orientation Left    Aggravating Factors  walking more to go to the MD                               [facility] Adult PT Treatment/Exercise - 11/21/21 0001       Ambulation/Gait   Gait Comments with FWW and gait belt out the back door down a small slope on sidewalk and pavers then back      High Level Balance   High Level Balance Activities Side stepping;Backward walking    High Level Balance Comments 4" toe touches with her using two SPC's      Knee/Hip Exercises: Aerobic   Nustep level 5 x 6 minutes  Knee/Hip Exercises: Machines for Strengthening   Cybex Knee Extension 5# 2x10    Cybex Knee Flexion 20# 2x10      Knee/Hip Exercises: Seated   Other Seated Knee/Hip Exercises sit to stand with belt and trying to do without hands    Other Seated Knee/Hip Exercises red tband rows and extensions 2x10                       PT Short Term Goals - 10/31/21 1407       PT SHORT TERM GOAL #1   Title Pt will be I and compliant with HEP    Baseline Initiated.    Time 3    Period Weeks    Status On-going               PT Long Term Goals - 11/19/21 1805       PT LONG TERM GOAL #1   Title Pt will improve BERG balance without AD to >18 to show improved balance.    Status On-going      PT LONG TERM GOAL #2   Title Pt will improve gait speed and TUG time to show improved gait and balance below 24 seconds    Status On-going      PT LONG TERM GOAL #3   Title increase LE strength to 4+/5     Status On-going      PT LONG TERM GOAL #4   Title increase UE strength to 4+/5    Status On-going                   Plan - 11/21/21 1751     Clinical Impression Statement I challenged her a little more added some Leg extension and flexionw ith weights walked outside and added some balance activities she is very hesitant with balance as she has very poor sensations in the feet, cannot stand on own needs to use hands or have assist.  Does have a lot of fear with balance activities    PT Next Visit Plan continue to work on her balance, strength, gait and overall function    Consulted and Agree with Plan of Care Patient             Patient will benefit from skilled therapeutic intervention in order to improve the following deficits and impairments:  Abnormal gait, Decreased range of motion, Difficulty walking, Decreased endurance, Pain, Decreased activity tolerance, Decreased balance, Decreased strength, Decreased mobility  Visit Diagnosis: Muscle weakness (generalized)  Unsteady gait  Difficulty in walking, not elsewhere classified     Problem List Patient Active Problem List   Diagnosis Date Noted   Closed low lateral malleolus fracture 05/16/2021   Gout 05/16/2021   Hyperlipemia 05/16/2021   Hypertension 05/16/2021   Type II diabetes mellitus ([place]) 05/16/2021   Spinal stenosis of lumbar region 03/10/2019   Cervical myelopathy ([place]) 03/10/2019   Peripheral neuropathy 02/02/2019   Gait abnormality 02/02/2019   Urinary urgency 02/02/2019   Chronic bilateral low back pain without sciatica 02/02/2019   DOE (dyspnea on exertion) 10/05/2017   Bruit of right carotid artery 10/05/2017   Coronary artery calcification seen on CT scan 10/05/2017   Chest pain 10/04/2017   Aortic atherosclerosis ([place]) 10/04/2017   Charcot foot due to diabetes mellitus ([place]) 11/23/2014    Sumner Boast, PT 11/21/2021, 5:53 PM  [facility] [place] [place], [place], [address] Phone: [phone]  Fax:  [phone]  Name: Pamela Snyder MRN: [id number] Date of Birth: 03-30-1946

## 2021-11-26 ENCOUNTER — Encounter: Payer: Self-pay | Admitting: Physical Therapy

## 2021-11-26 ENCOUNTER — Ambulatory Visit: Payer: Medicare Other | Admitting: Physical Therapy

## 2021-11-26 DIAGNOSIS — R2681 Unsteadiness on feet: Secondary | ICD-10-CM | POA: Diagnosis not present

## 2021-11-26 DIAGNOSIS — R262 Difficulty in walking, not elsewhere classified: Secondary | ICD-10-CM | POA: Diagnosis not present

## 2021-11-26 DIAGNOSIS — M6281 Muscle weakness (generalized): Secondary | ICD-10-CM | POA: Diagnosis not present

## 2021-11-26 NOTE — Therapy (Signed)
[facility]. [place], [place], [address] Phone: [phone]   Fax:  [phone]  Physical Therapy Treatment  Patient Details  Name: Pamela Snyder MRN: [id number] Date of Birth: 1945-11-30 Referring Provider (PT): Alfredo Martinez   Encounter Date: 11/26/2021   PT End of Session - 11/26/21 1712     Visit Number 9    Date for PT Re-Evaluation 01/22/22    PT Start Time 1708    PT Stop Time 1755    PT Time Calculation (min) 47 min    Activity Tolerance Patient tolerated treatment well    Behavior During Therapy [facility] for tasks assessed/performed             Past Medical History:  Diagnosis Date   Anxiety    Arthritis    "right foot; spine; hands" (11/23/2014)   Charcot foot due to diabetes mellitus ([place])    Depression    GERD (gastroesophageal reflux disease)    Gout    Hyperlipemia    Hypertension    Migraine    hx   Neuropathy    Numbness    Thyroid goiter 1986   Type II diabetes mellitus ([place])     Past Surgical History:  Procedure Laterality Date   ABDOMINAL HYSTERECTOMY  1999   w/BSO   ACHILLES TENDON LENGTHENING Right 11/23/2014   ACHILLES TENDON LENGTHENING Right 11/23/2014   Procedure: RIGHT ACHILLES PERCUTANEOUS TENDON LENGTHENING;  Surgeon: Wylene Simmer, MD;  Location: [facility];  Service: Orthopedics;  Laterality: Right;   [place] Right 11/23/2014   mid foot   CARDIAC CATHETERIZATION  08/2004   FOOT ARTHRODESIS Right 11/23/2014   Procedure: RIGHT MID FOOT ARTHRODESIS;  Surgeon: Wylene Simmer, MD;  Location: [facility];  Service: Orthopedics;  Laterality: Right;   METATARSAL OSTEOTOMY Right 11/23/2014   Procedure: RIGHT MID FOOT OSTEOTOMY;  Surgeon: Wylene Simmer, MD;  Location: [facility];  Service: Orthopedics;  Laterality: Right;   ORIF ANKLE FRACTURE Left 05/16/2021   Procedure: OPEN REDUCTION INTERNAL FIXATION (ORIF) Left ankle lateral malleolus, possible deltoid ligament repair;   Surgeon: Wylene Simmer, MD;  Location: [facility];  Service: Orthopedics;  Laterality: Left;   OSTEOTOMY Right 11/23/2014   mid foot   PARTIAL THYMECTOMY  1986   ? side   TONSILLECTOMY  1954    There were no vitals filed for this visit.   Subjective Assessment - 11/26/21 1713     Subjective I was doing well a was tired after the last time    Currently in Pain? No/denies                               [facility] Adult PT Treatment/Exercise - 11/26/21 0001       Ambulation/Gait   Gait Comments with FWW, outside down slope on paver path and then we negotiated down and then up a curb, needs a lot of cues for safety and CGA/Min A to get up and down safely, stairs ith 4" and 6" steps tried to get her to do step over step up and down, 6" going up was very difficult      High Level Balance   High Level Balance Comments beach ball toss, with a lot of Mod /Max A, side stp over a stick  a lot of cues and HHA, 4" toe touches      Knee/Hip Exercises: Aerobic  Nustep level 5 x 6 minutes      Knee/Hip Exercises: Machines for Strengthening   Cybex Knee Extension 5# 2x10    Cybex Knee Flexion 20# 2x10    Other Machine lat pulls 20# 2x10, triceps 15# 2x10, with the triceps she nned ModA for balance                       PT Short Term Goals - 10/31/21 1407       PT SHORT TERM GOAL #1   Title Pt will be I and compliant with HEP    Baseline Initiated.    Time 3    Period Weeks    Status On-going               PT Long Term Goals - 11/26/21 1753       PT LONG TERM GOAL #3   Title increase LE strength to 4+/5    Status On-going      PT LONG TERM GOAL #4   Title increase UE strength to 4+/5    Status On-going                   Plan - 11/26/21 1753     Clinical Impression Statement I tried a little more walking and she had to negotiate a curb, this was very scary for her.  I added some total body exercises, balance and fear are the biggest  issues    PT Next Visit Plan continue to work on her balance, strength, gait and overall function    Consulted and Agree with Plan of Care Patient             Patient will benefit from skilled therapeutic intervention in order to improve the following deficits and impairments:  Abnormal gait, Decreased range of motion, Difficulty walking, Decreased endurance, Pain, Decreased activity tolerance, Decreased balance, Decreased strength, Decreased mobility  Visit Diagnosis: Muscle weakness (generalized)  Unsteady gait  Difficulty in walking, not elsewhere classified     Problem List Patient Active Problem List   Diagnosis Date Noted   Closed low lateral malleolus fracture 05/16/2021   Gout 05/16/2021   Hyperlipemia 05/16/2021   Hypertension 05/16/2021   Type II diabetes mellitus ([place]) 05/16/2021   Spinal stenosis of lumbar region 03/10/2019   Cervical myelopathy ([place]) 03/10/2019   Peripheral neuropathy 02/02/2019   Gait abnormality 02/02/2019   Urinary urgency 02/02/2019   Chronic bilateral low back pain without sciatica 02/02/2019   DOE (dyspnea on exertion) 10/05/2017   Bruit of right carotid artery 10/05/2017   Coronary artery calcification seen on CT scan 10/05/2017   Chest pain 10/04/2017   Aortic atherosclerosis ([place]) 10/04/2017   Charcot foot due to diabetes mellitus ([place]) 11/23/2014    Sumner Boast, PT 11/26/2021, 6:35 PM  [facility]. [place], [place], [address] Phone: [phone]   Fax:  [phone]  Name: Zyon Grout MRN: [id number] Date of Birth: [birth year]

## 2021-11-28 ENCOUNTER — Ambulatory Visit: Payer: Medicare Other | Admitting: Physical Therapy

## 2021-11-28 ENCOUNTER — Encounter: Payer: Self-pay | Admitting: Physical Therapy

## 2021-11-28 DIAGNOSIS — R262 Difficulty in walking, not elsewhere classified: Secondary | ICD-10-CM | POA: Diagnosis not present

## 2021-11-28 DIAGNOSIS — M6281 Muscle weakness (generalized): Secondary | ICD-10-CM | POA: Diagnosis not present

## 2021-11-28 DIAGNOSIS — R2681 Unsteadiness on feet: Secondary | ICD-10-CM

## 2021-11-28 NOTE — Therapy (Addendum)
[place]. [place], [place], [address] Phone: [phone]   Fax:  [phone]  Physical Therapy Treatment Progress Note Reporting Period 10/23/21 to 11/28/21  See note below for Objective Data and Assessment of Progress/Goals.     Patient Details  Name: Pamela Snyder MRN: [id number] Date of Birth: [birth year] Referring Provider (PT): Alfredo Martinez   Encounter Date: 11/28/2021   PT End of Session - 11/28/21 1621     Visit Number 10    Date for PT Re-Evaluation 01/22/22    PT Start Time 1529    PT Stop Time 1613    PT Time Calculation (min) 44 min    Equipment Utilized During Treatment Gait belt    Activity Tolerance Patient tolerated treatment well    Behavior During Therapy WFL for tasks assessed/performed             Past Medical History:  Diagnosis Date   Anxiety    Arthritis    "right foot; spine; hands" (11/23/2014)   Charcot foot due to diabetes mellitus ([place])    Depression    GERD (gastroesophageal reflux disease)    Gout    Hyperlipemia    Hypertension    Migraine    hx   Neuropathy    Numbness    Thyroid goiter 1986   Type II diabetes mellitus ([place])     Past Surgical History:  Procedure Laterality Date   ABDOMINAL HYSTERECTOMY  1999   w/BSO   ACHILLES TENDON LENGTHENING Right 11/23/2014   ACHILLES TENDON LENGTHENING Right 11/23/2014   Procedure: RIGHT ACHILLES PERCUTANEOUS TENDON LENGTHENING;  Surgeon: Wylene Simmer, MD;  Location: [facility];  Service: Orthopedics;  Laterality: Right;   [place] Right 11/23/2014   mid foot   CARDIAC CATHETERIZATION  08/2004   FOOT ARTHRODESIS Right 11/23/2014   Procedure: RIGHT MID FOOT ARTHRODESIS;  Surgeon: Wylene Simmer, MD;  Location: [facility];  Service: Orthopedics;  Laterality: Right;   METATARSAL OSTEOTOMY Right 11/23/2014   Procedure: RIGHT MID FOOT OSTEOTOMY;  Surgeon: Wylene Simmer, MD;  Location: [facility];  Service: Orthopedics;   Laterality: Right;   ORIF ANKLE FRACTURE Left 05/16/2021   Procedure: OPEN REDUCTION INTERNAL FIXATION (ORIF) Left ankle lateral malleolus, possible deltoid ligament repair;  Surgeon: Wylene Simmer, MD;  Location: [facility];  Service: Orthopedics;  Laterality: Left;   OSTEOTOMY Right 11/23/2014   mid foot   PARTIAL THYMECTOMY  1986   ? side   TONSILLECTOMY  1954    There were no vitals filed for this visit.   Subjective Assessment - 11/28/21 1534     Subjective Patient reports continued improvement. She feels she needs to continue to work on her strength everywhere. Work on balance, get back to [employer].    Pertinent History reports HNP in the neck and the low back, reports sciatica and some numbness in the hands.  She had right foot surgery 2016 Charcot foot    How long can you stand comfortably? 3-5 minutes    How long can you walk comfortably? 50-70 feet    Patient Stated Goals walk normally with a rollator, feel stronger and have more indepdnence    Currently in Pain? No/denies    Pain Onset More than a month ago                [facility] PT Assessment - 11/28/21 0001       Berg Balance Test   Sit  to Stand Able to stand  independently using hands    Standing Unsupported Unable to stand 30 seconds unassisted    Sitting with Back Unsupported but Feet Supported on Floor or Stool Able to sit safely and securely 2 minutes    Stand to Sit Controls descent by using hands    Transfers Able to transfer safely, definite need of hands    Standing Unsupported with Eyes Closed Needs help to keep from falling    Standing Unsupported with Feet Together Needs help to attain position and unable to hold for 15 seconds    From Standing, Reach Forward with Outstretched Arm Loses balance while trying/requires external support    From Standing Position, Pick up Object from Floor Unable to try/needs assist to keep balance    From Standing Position, Turn to Look Behind Over each Shoulder Needs assist to  keep from losing balance and falling    Turn 360 Degrees Needs assistance while turning    Standing Unsupported, Alternately Place Feet on Step/Stool Needs assistance to keep from falling or unable to try    Standing Unsupported, One Foot in Front Loses balance while stepping or standing    Standing on One Leg Unable to try or needs assist to prevent fall    Total Score 13                           [facility] Adult PT Treatment/Exercise - 11/28/21 0001       Ankle Exercises: Seated   Other Seated Ankle Exercises Ankle DF iwth mild A and PF against yellow Tband resistance, instructed to pull up as much as possible and not rely on band.                 Balance Exercises - 11/28/21 0001       Balance Exercises: Standing   Other Standing Exercises Quick steps R/L and For/Back in parallel bars to increase speed of movement.                  PT Short Term Goals - 11/28/21 1537       PT SHORT TERM GOAL #1   Title Pt will be I and compliant with HEP    Baseline Initiated, patient reports she is not following HEP.    Time 3    Period Weeks    Status On-going               PT Long Term Goals - 11/28/21 1542       PT LONG TERM GOAL #1   Title Pt will improve BERG balance without AD to >18 to show improved balance.    Baseline 13    Time 8    Period Weeks    Status On-going      PT LONG TERM GOAL #2   Title Pt will improve gait speed and TUG time to show improved gait and balance below 24 seconds    Baseline 22.99    Status Achieved      PT LONG TERM GOAL #3   Title increase LE strength to 4+/5    Baseline hip and knee (4-)-4/5, R ankle 2+/5, L ankle 2/5 PF, 2-/5 DF    Time 8    Period Weeks    Status On-going      PT LONG TERM GOAL #4   Title increase UE strength to 4+/5    Status On-going  PT LONG TERM GOAL #5   Title report able to make a sandwich without sitting    Status Achieved      Additional Long Term Goals    Additional Long Term Goals Yes      PT LONG TERM GOAL #6   Title Decrease TUG to <18 seconds.    Baseline 22.99    Time 8    Period Weeks    Status New                   Plan - 11/28/21 1538     Clinical Impression Statement Patient initally reported she had not been performing HEP. However, after further discussion, she has been performing multiple stretches as well as strengthening exercises at home, some from the program, some others Independently. Re-assessed L ankle and initiated additional strengthening exercises. Also re-assessed her status and performed balance training and speed of movmeent exercises to decrease fall risk.    Stability/Clinical Decision Making Evolving/Moderate complexity    Clinical Decision Making Moderate    Rehab Potential Fair    PT Frequency 2x / week    PT Duration 8 weeks    PT Treatment/Interventions ADLs/Self Care Home Management;Gait training;Stair training;Functional mobility training;Therapeutic activities;Therapeutic exercise;Balance training;Neuromuscular re-education;Manual techniques;Patient/family education    PT Next Visit Plan continue to work on her balance, speed of movement, strength-including L ankle, gait and overall function.    PT Home Exercise Plan [id number]    Consulted and Agree with Plan of Care Patient             Patient will benefit from skilled therapeutic intervention in order to improve the following deficits and impairments:  Abnormal gait, Decreased range of motion, Difficulty walking, Decreased endurance, Pain, Decreased activity tolerance, Decreased balance, Decreased strength, Decreased mobility  Visit Diagnosis: Muscle weakness (generalized)  Unsteady gait  Difficulty in walking, not elsewhere classified     Problem List Patient Active Problem List   Diagnosis Date Noted   Closed low lateral malleolus fracture 05/16/2021   Gout 05/16/2021   Hyperlipemia 05/16/2021   Hypertension 05/16/2021    Type II diabetes mellitus ([place]) 05/16/2021   Spinal stenosis of lumbar region 03/10/2019   Cervical myelopathy ([place]) 03/10/2019   Peripheral neuropathy 02/02/2019   Gait abnormality 02/02/2019   Urinary urgency 02/02/2019   Chronic bilateral low back pain without sciatica 02/02/2019   DOE (dyspnea on exertion) 10/05/2017   Bruit of right carotid artery 10/05/2017   Coronary artery calcification seen on CT scan 10/05/2017   Chest pain 10/04/2017   Aortic atherosclerosis ([place]) 10/04/2017   Charcot foot due to diabetes mellitus ([place]) 11/23/2014    Marcelina Morel, DPT 11/28/2021, 4:26 PM  [place]. [place], [place], [address] Phone: [phone]   Fax:  [phone]  Name: Pamela Snyder MRN: [id number] Date of Birth: 08-Oct-1945

## 2021-12-03 ENCOUNTER — Encounter: Payer: Self-pay | Admitting: Physical Therapy

## 2021-12-03 ENCOUNTER — Ambulatory Visit: Payer: Medicare Other | Admitting: Physical Therapy

## 2021-12-03 DIAGNOSIS — R262 Difficulty in walking, not elsewhere classified: Secondary | ICD-10-CM | POA: Diagnosis not present

## 2021-12-03 DIAGNOSIS — M6281 Muscle weakness (generalized): Secondary | ICD-10-CM

## 2021-12-03 DIAGNOSIS — R2681 Unsteadiness on feet: Secondary | ICD-10-CM | POA: Diagnosis not present

## 2021-12-03 NOTE — Therapy (Signed)
[place]. [place], [place], [address] Phone: [phone]   Fax:  [phone]  Physical Therapy Treatment  Patient Details  Name: Pamela Snyder MRN: [id number] Date of Birth: July 08, 1945 Referring Provider (PT): Alfredo Martinez   Encounter Date: 12/03/2021   PT End of Session - 12/03/21 1716     Visit Number 11    Date for PT Re-Evaluation 01/22/22    PT Start Time 1710    PT Stop Time 1800    PT Time Calculation (min) 50 min    Equipment Utilized During Treatment Gait belt    Activity Tolerance Patient tolerated treatment well    Behavior During Therapy [facility] for tasks assessed/performed             Past Medical History:  Diagnosis Date   Anxiety    Arthritis    "right foot; spine; hands" (11/23/2014)   Charcot foot due to diabetes mellitus ([place])    Depression    GERD (gastroesophageal reflux disease)    Gout    Hyperlipemia    Hypertension    Migraine    hx   Neuropathy    Numbness    Thyroid goiter 1986   Type II diabetes mellitus ([place])     Past Surgical History:  Procedure Laterality Date   ABDOMINAL HYSTERECTOMY  1999   w/BSO   ACHILLES TENDON LENGTHENING Right 11/23/2014   ACHILLES TENDON LENGTHENING Right 11/23/2014   Procedure: RIGHT ACHILLES PERCUTANEOUS TENDON LENGTHENING;  Surgeon: Wylene Simmer, MD;  Location: [facility];  Service: Orthopedics;  Laterality: Right;   [place] Right 11/23/2014   mid foot   CARDIAC CATHETERIZATION  08/2004   FOOT ARTHRODESIS Right 11/23/2014   Procedure: RIGHT MID FOOT ARTHRODESIS;  Surgeon: Wylene Simmer, MD;  Location: [facility];  Service: Orthopedics;  Laterality: Right;   METATARSAL OSTEOTOMY Right 11/23/2014   Procedure: RIGHT MID FOOT OSTEOTOMY;  Surgeon: Wylene Simmer, MD;  Location: [facility];  Service: Orthopedics;  Laterality: Right;   ORIF ANKLE FRACTURE Left 05/16/2021   Procedure: OPEN REDUCTION INTERNAL FIXATION (ORIF) Left ankle lateral  malleolus, possible deltoid ligament repair;  Surgeon: Wylene Simmer, MD;  Location: [facility];  Service: Orthopedics;  Laterality: Left;   OSTEOTOMY Right 11/23/2014   mid foot   PARTIAL THYMECTOMY  1986   ? side   TONSILLECTOMY  1954    There were no vitals filed for this visit.   Subjective Assessment - 12/03/21 1717     Subjective I need new tennis balls on the walker, kind of a bad weekend.  my anniversary.    Currently in Pain? No/denies                               [facility] Adult PT Treatment/Exercise - 12/03/21 0001       Ambulation/Gait   Gait Comments with FWW, outside down slope on paver path and then we negotiated down and then up a curb, needs a lot of cues for safety and CGA/Min A to get up and down safely,      High Level Balance   High Level Balance Activities Side stepping;Backward walking    High Level Balance Comments 4" cone toe touches      Knee/Hip Exercises: Aerobic   Nustep level 5 x 6 minutes      Knee/Hip Exercises: Machines for Strengthening   Cybex Knee Extension  5# 2x10    Cybex Knee Flexion 20# 2x10      Knee/Hip Exercises: Supine   Other Supine Knee/Hip Exercises feet on ball K2C, trunk rotation, small bridges and isometric abs                       PT Short Term Goals - 11/28/21 1537       PT SHORT TERM GOAL #1   Title Pt will be I and compliant with HEP    Baseline Initiated, patient reports she is not following HEP.    Time 3    Period Weeks    Status On-going               PT Long Term Goals - 12/03/21 1718       PT LONG TERM GOAL #4   Title increase UE strength to 4+/5    Status On-going      PT LONG TERM GOAL #5   Title report able to make a sandwich without sitting    Status Achieved                   Plan - 12/03/21 1851     Clinical Impression Statement Patient with diffiuclty going up curbs, she has a lot of difficulty with balance, has a goal of being able to walk  further, I added some core exercises and she struggle with this some.    PT Next Visit Plan continue to work on her balance, speed of movement, strength-including L ankle, gait and overall function.             Patient will benefit from skilled therapeutic intervention in order to improve the following deficits and impairments:  Abnormal gait, Decreased range of motion, Difficulty walking, Decreased endurance, Pain, Decreased activity tolerance, Decreased balance, Decreased strength, Decreased mobility  Visit Diagnosis: Muscle weakness (generalized)  Unsteady gait  Difficulty in walking, not elsewhere classified     Problem List Patient Active Problem List   Diagnosis Date Noted   Closed low lateral malleolus fracture 05/16/2021   Gout 05/16/2021   Hyperlipemia 05/16/2021   Hypertension 05/16/2021   Type II diabetes mellitus ([place]) 05/16/2021   Spinal stenosis of lumbar region 03/10/2019   Cervical myelopathy ([place]) 03/10/2019   Peripheral neuropathy 02/02/2019   Gait abnormality 02/02/2019   Urinary urgency 02/02/2019   Chronic bilateral low back pain without sciatica 02/02/2019   DOE (dyspnea on exertion) 10/05/2017   Bruit of right carotid artery 10/05/2017   Coronary artery calcification seen on CT scan 10/05/2017   Chest pain 10/04/2017   Aortic atherosclerosis ([place]) 10/04/2017   Charcot foot due to diabetes mellitus ([place]) 11/23/2014    Sumner Boast, PT 12/03/2021, 6:52 PM  [place]. [place], [place], [address] Phone: [phone]   Fax:  [phone]  Name: Jalee Saine MRN: [id number] Date of Birth: 1946-06-08

## 2021-12-05 ENCOUNTER — Encounter: Payer: Self-pay | Admitting: Physical Therapy

## 2021-12-05 ENCOUNTER — Ambulatory Visit: Payer: Medicare Other | Attending: Student | Admitting: Physical Therapy

## 2021-12-05 DIAGNOSIS — M6281 Muscle weakness (generalized): Secondary | ICD-10-CM | POA: Diagnosis not present

## 2021-12-05 DIAGNOSIS — R2681 Unsteadiness on feet: Secondary | ICD-10-CM | POA: Diagnosis not present

## 2021-12-05 DIAGNOSIS — R262 Difficulty in walking, not elsewhere classified: Secondary | ICD-10-CM

## 2021-12-05 NOTE — Therapy (Signed)
[facility]. [place], [place], [address] Phone: [phone]   Fax:  [phone]  Physical Therapy Treatment  Patient Details  Name: Pamela Snyder MRN: [id number] Date of Birth: 1946/05/06 Referring Provider (PT): Alfredo Martinez   Encounter Date: 12/05/2021   PT End of Session - 12/05/21 1725     Visit Number 12    Date for PT Re-Evaluation 01/22/22    PT Start Time 1723    PT Stop Time 1809    PT Time Calculation (min) 46 min    Equipment Utilized During Treatment Gait belt    Activity Tolerance Patient tolerated treatment well    Behavior During Therapy WFL for tasks assessed/performed             Past Medical History:  Diagnosis Date   Anxiety    Arthritis    "right foot; spine; hands" (11/23/2014)   Charcot foot due to diabetes mellitus ([place])    Depression    GERD (gastroesophageal reflux disease)    Gout    Hyperlipemia    Hypertension    Migraine    hx   Neuropathy    Numbness    Thyroid goiter 1986   Type II diabetes mellitus ([place])     Past Surgical History:  Procedure Laterality Date   ABDOMINAL HYSTERECTOMY  1999   w/BSO   ACHILLES TENDON LENGTHENING Right 11/23/2014   ACHILLES TENDON LENGTHENING Right 11/23/2014   Procedure: RIGHT ACHILLES PERCUTANEOUS TENDON LENGTHENING;  Surgeon: Wylene Simmer, MD;  Location: [facility];  Service: Orthopedics;  Laterality: Right;   [place] Right 11/23/2014   mid foot   CARDIAC CATHETERIZATION  08/2004   FOOT ARTHRODESIS Right 11/23/2014   Procedure: RIGHT MID FOOT ARTHRODESIS;  Surgeon: Wylene Simmer, MD;  Location: [facility];  Service: Orthopedics;  Laterality: Right;   METATARSAL OSTEOTOMY Right 11/23/2014   Procedure: RIGHT MID FOOT OSTEOTOMY;  Surgeon: Wylene Simmer, MD;  Location: [facility];  Service: Orthopedics;  Laterality: Right;   ORIF ANKLE FRACTURE Left 05/16/2021   Procedure: OPEN REDUCTION INTERNAL FIXATION (ORIF) Left ankle lateral  malleolus, possible deltoid ligament repair;  Surgeon: Wylene Simmer, MD;  Location: [facility];  Service: Orthopedics;  Laterality: Left;   OSTEOTOMY Right 11/23/2014   mid foot   PARTIAL THYMECTOMY  1986   ? side   TONSILLECTOMY  1954    There were no vitals filed for this visit.   Subjective Assessment - 12/05/21 1725     Subjective No falls, a little sore in the right buttock    Currently in Pain? Yes    Pain Score 3     Pain Location Hip    Pain Orientation Right    Pain Descriptors / Indicators Sore    Aggravating Factors  I may have been lying down wrong                               [facility] Adult PT Treatment/Exercise - 12/05/21 0001       Ambulation/Gait   Gait Comments with FWW, outside down slope on paver path and then we negotiated down and then up a curb, needs a lot of cues for safety and CGA/Min A to get up and down safely,, walked halfway around the parking island, had to take one longer break, educate on negotiating thresholds and curbs      High Level Balance  High Level Balance Activities Side stepping;Backward walking    High Level Balance Comments seated beach ball working on core      Knee/Hip Exercises: Aerobic   Nustep level 5 x 6 minutes      Knee/Hip Exercises: Standing   Hip Abduction Both;2 sets;10 reps    Abduction Limitations 2.5    Hip Extension Both;2 sets;10 reps    Extension Limitations 2.5#                       PT Short Term Goals - 11/28/21 1537       PT SHORT TERM GOAL #1   Title Pt will be I and compliant with HEP    Baseline Initiated, patient reports she is not following HEP.    Time 3    Period Weeks    Status On-going               PT Long Term Goals - 12/05/21 1726       PT LONG TERM GOAL #1   Title Pt will improve BERG balance without AD to >18 to show improved balance.    Status On-going      PT LONG TERM GOAL #2   Title Pt will improve gait speed and TUG time to show improved  gait and balance below 24 seconds    Status Achieved                   Plan - 12/05/21 1812     Clinical Impression Statement Walked further today, she really fatigued with the outside walking.  She really struggles on the curbs needs some CGA/min A for safety and to complete this, I educated her on the negotiating thresholds as she is very bullish and seems to be out of control advised her to go straight on and gently lift the front wheels over and then the back legs.  She did better when leaving, she does have some weakness in the hips left especially.    PT Next Visit Plan continue to work on her balance, speed of movement, strength-including L ankle, gait and overall function.    Consulted and Agree with Plan of Care Patient             Patient will benefit from skilled therapeutic intervention in order to improve the following deficits and impairments:  Abnormal gait, Decreased range of motion, Difficulty walking, Decreased endurance, Pain, Decreased activity tolerance, Decreased balance, Decreased strength, Decreased mobility  Visit Diagnosis: Muscle weakness (generalized)  Unsteady gait  Difficulty in walking, not elsewhere classified     Problem List Patient Active Problem List   Diagnosis Date Noted   Closed low lateral malleolus fracture 05/16/2021   Gout 05/16/2021   Hyperlipemia 05/16/2021   Hypertension 05/16/2021   Type II diabetes mellitus ([place]) 05/16/2021   Spinal stenosis of lumbar region 03/10/2019   Cervical myelopathy ([place]) 03/10/2019   Peripheral neuropathy 02/02/2019   Gait abnormality 02/02/2019   Urinary urgency 02/02/2019   Chronic bilateral low back pain without sciatica 02/02/2019   DOE (dyspnea on exertion) 10/05/2017   Bruit of right carotid artery 10/05/2017   Coronary artery calcification seen on CT scan 10/05/2017   Chest pain 10/04/2017   Aortic atherosclerosis ([place]) 10/04/2017   Charcot foot due to diabetes mellitus ([place])  11/23/2014    Sumner Boast, PT 12/05/2021, 6:16 PM  [place]. [place], [place], [address]  Phone: [phone]   Fax:  [phone]  Name: Pamela Snyder MRN: [id number] Date of Birth: May 05, 1946

## 2021-12-11 ENCOUNTER — Encounter: Payer: Self-pay | Admitting: Physical Therapy

## 2021-12-11 ENCOUNTER — Ambulatory Visit: Payer: Medicare Other | Admitting: Physical Therapy

## 2021-12-11 DIAGNOSIS — M6281 Muscle weakness (generalized): Secondary | ICD-10-CM

## 2021-12-11 DIAGNOSIS — R2681 Unsteadiness on feet: Secondary | ICD-10-CM

## 2021-12-11 DIAGNOSIS — R262 Difficulty in walking, not elsewhere classified: Secondary | ICD-10-CM

## 2021-12-11 NOTE — Therapy (Signed)
[place] [place], [place], [address] Phone: [phone]   Fax:  [phone]  Physical Therapy Treatment  Patient Details  Name: Pamela Snyder MRN: [id number] Date of Birth: 14-Aug-1945 Referring Provider (PT): Alfredo Martinez   Encounter Date: 12/11/2021   PT End of Session - 12/11/21 1744     Visit Number 13    Date for PT Re-Evaluation 01/22/22    PT Start Time 1700    PT Stop Time 1740    PT Time Calculation (min) 40 min    Activity Tolerance Patient tolerated treatment well    Behavior During Therapy [facility] for tasks assessed/performed             Past Medical History:  Diagnosis Date   Anxiety    Arthritis    "right foot; spine; hands" (11/23/2014)   Charcot foot due to diabetes mellitus ([place])    Depression    GERD (gastroesophageal reflux disease)    Gout    Hyperlipemia    Hypertension    Migraine    hx   Neuropathy    Numbness    Thyroid goiter 1986   Type II diabetes mellitus ([place])     Past Surgical History:  Procedure Laterality Date   ABDOMINAL HYSTERECTOMY  1999   w/BSO   ACHILLES TENDON LENGTHENING Right 11/23/2014   ACHILLES TENDON LENGTHENING Right 11/23/2014   Procedure: RIGHT ACHILLES PERCUTANEOUS TENDON LENGTHENING;  Surgeon: Wylene Simmer, MD;  Location: [place];  Service: Orthopedics;  Laterality: Right;   [place] Right 11/23/2014   mid foot   CARDIAC CATHETERIZATION  08/2004   FOOT ARTHRODESIS Right 11/23/2014   Procedure: RIGHT MID FOOT ARTHRODESIS;  Surgeon: Wylene Simmer, MD;  Location: [place];  Service: Orthopedics;  Laterality: Right;   METATARSAL OSTEOTOMY Right 11/23/2014   Procedure: RIGHT MID FOOT OSTEOTOMY;  Surgeon: Wylene Simmer, MD;  Location: [place];  Service: Orthopedics;  Laterality: Right;   ORIF ANKLE FRACTURE Left 05/16/2021   Procedure: OPEN REDUCTION INTERNAL FIXATION (ORIF) Left ankle lateral malleolus, possible deltoid ligament repair;   Surgeon: Wylene Simmer, MD;  Location: [place];  Service: Orthopedics;  Laterality: Left;   OSTEOTOMY Right 11/23/2014   mid foot   PARTIAL THYMECTOMY  1986   ? side   TONSILLECTOMY  1954    There were no vitals filed for this visit.   Subjective Assessment - 12/11/21 1658     Subjective My friend from [place] is here with me this week while my girls are out of town, went to [place] and walked around yesterday I was tired after that. My friend has to go home tomorrow. I feel like I need to build some leg strength.    Pertinent History reports HNP in the neck and the low back, reports sciatica and some numbness in the hands.  She had right foot surgery 2016 Charcot foot    Patient Stated Goals walk normally with a rollator, feel stronger and have more indepdnence    Currently in Pain? No/denies                               [facility] Adult PT Treatment/Exercise - 12/11/21 0001       Knee/Hip Exercises: Aerobic   Nustep level 5 x 6 minutes      Knee/Hip Exercises: Standing   Lateral Step Up Both;1 set;10 reps;Hand Hold: 2;Step  Height: 4"    Forward Step Up Both;1 set;10 reps;Hand Hold: 2;Step Height: 4"    Functional Squat 1 set;10 reps    Functional Squat Limitations BUE support    Gait Training gait training outside over uneven surfaces and curbs; practiced navigating curbs forward and backward with RW needed Min-ModA for balance                     PT Education - 12/11/21 1744     Education Details technique for navigating curbs, reminders for safe hand placement/RW management with transfers    Person(s) Educated Patient    Methods Explanation    Comprehension Verbalized understanding              PT Short Term Goals - 11/28/21 1537       PT SHORT TERM GOAL #1   Title Pt will be I and compliant with HEP    Baseline Initiated, patient reports she is not following HEP.    Time 3    Period Weeks    Status On-going                PT Long Term Goals - 12/05/21 1726       PT LONG TERM GOAL #1   Title Pt will improve BERG balance without AD to >18 to show improved balance.    Status On-going      PT LONG TERM GOAL #2   Title Pt will improve gait speed and TUG time to show improved gait and balance below 24 seconds    Status Achieved                   Plan - 12/11/21 1744     Clinical Impression Statement Pamela Snyder arrives today doing OK, really wants to keep focusing on LE strength today. Warmed up on the nustep then we were able to go outside and tried backwards ascent of curb with RW- needed Min-ModA for balance due to posterior unsteadiness trying to get RW back up to curb after stepping up. Otherwise tried some more closed chain strengthening activities today. Definitely making slow but steady progress.    Stability/Clinical Decision Making Evolving/Moderate complexity    Clinical Decision Making Moderate    Rehab Potential Fair    PT Frequency 2x / week    PT Duration 8 weeks    PT Treatment/Interventions ADLs/Self Care Home Management;Gait training;Stair training;Functional mobility training;Therapeutic activities;Therapeutic exercise;Balance training;Neuromuscular re-education;Manual techniques;Patient/family education    PT Next Visit Plan continue to work on her balance, speed of movement, strength-including L ankle, gait and overall function.    PT Home Exercise Plan [id number]    Consulted and Agree with Plan of Care Patient             Patient will benefit from skilled therapeutic intervention in order to improve the following deficits and impairments:  Abnormal gait, Decreased range of motion, Difficulty walking, Decreased endurance, Pain, Decreased activity tolerance, Decreased balance, Decreased strength, Decreased mobility  Visit Diagnosis: Muscle weakness (generalized)  Unsteady gait  Difficulty in walking, not elsewhere classified     Problem List Patient Active Problem List    Diagnosis Date Noted   Closed low lateral malleolus fracture 05/16/2021   Gout 05/16/2021   Hyperlipemia 05/16/2021   Hypertension 05/16/2021   Type II diabetes mellitus ([place]) 05/16/2021   Spinal stenosis of lumbar region 03/10/2019   Cervical myelopathy ([place]) 03/10/2019   Peripheral neuropathy 02/02/2019   Gait abnormality  02/02/2019   Urinary urgency 02/02/2019   Chronic bilateral low back pain without sciatica 02/02/2019   DOE (dyspnea on exertion) 10/05/2017   Bruit of right carotid artery 10/05/2017   Coronary artery calcification seen on CT scan 10/05/2017   Chest pain 10/04/2017   Aortic atherosclerosis ([place]) 10/04/2017   Charcot foot due to diabetes mellitus ([place]) 11/23/2014   Ann Lions PT, DPT, PN2   Supplemental Physical Therapist [place] [place], [place], [address] Phone: [phone]   Fax:  [phone]  Name: Pamela Snyder MRN: [id number] Date of Birth: Oct 17, 1945

## 2021-12-17 ENCOUNTER — Ambulatory Visit: Payer: Medicare Other | Admitting: Physical Therapy

## 2021-12-19 ENCOUNTER — Ambulatory Visit: Payer: Medicare Other | Admitting: Physical Therapy

## 2021-12-19 ENCOUNTER — Encounter: Payer: Self-pay | Admitting: Physical Therapy

## 2021-12-19 DIAGNOSIS — R2681 Unsteadiness on feet: Secondary | ICD-10-CM | POA: Diagnosis not present

## 2021-12-19 DIAGNOSIS — R262 Difficulty in walking, not elsewhere classified: Secondary | ICD-10-CM

## 2021-12-19 DIAGNOSIS — M6281 Muscle weakness (generalized): Secondary | ICD-10-CM | POA: Diagnosis not present

## 2021-12-19 NOTE — Therapy (Signed)
[place]. [place], [place], [address] Phone: [phone]   Fax:  [phone]  Physical Therapy Treatment  Patient Details  Name: Pamela Snyder MRN: [id number] Date of Birth: June 12, 1946 Referring Provider (PT): Alfredo Martinez   Encounter Date: 12/19/2021   PT End of Session - 12/19/21 1737     Visit Number 14    Date for PT Re-Evaluation 01/22/22    PT Start Time 1730    PT Stop Time 1820    PT Time Calculation (min) 50 min    Equipment Utilized During Treatment Gait belt    Activity Tolerance Patient tolerated treatment well    Behavior During Therapy [facility] for tasks assessed/performed             Past Medical History:  Diagnosis Date   Anxiety    Arthritis    "right foot; spine; hands" (11/23/2014)   Charcot foot due to diabetes mellitus ([place])    Depression    GERD (gastroesophageal reflux disease)    Gout    Hyperlipemia    Hypertension    Migraine    hx   Neuropathy    Numbness    Thyroid goiter 1986   Type II diabetes mellitus ([place])     Past Surgical History:  Procedure Laterality Date   ABDOMINAL HYSTERECTOMY  1999   w/BSO   ACHILLES TENDON LENGTHENING Right 11/23/2014   ACHILLES TENDON LENGTHENING Right 11/23/2014   Procedure: RIGHT ACHILLES PERCUTANEOUS TENDON LENGTHENING;  Surgeon: Wylene Simmer, MD;  Location: [place];  Service: Orthopedics;  Laterality: Right;   [place] Right 11/23/2014   mid foot   CARDIAC CATHETERIZATION  08/2004   FOOT ARTHRODESIS Right 11/23/2014   Procedure: RIGHT MID FOOT ARTHRODESIS;  Surgeon: Wylene Simmer, MD;  Location: [place];  Service: Orthopedics;  Laterality: Right;   METATARSAL OSTEOTOMY Right 11/23/2014   Procedure: RIGHT MID FOOT OSTEOTOMY;  Surgeon: Wylene Simmer, MD;  Location: [place];  Service: Orthopedics;  Laterality: Right;   ORIF ANKLE FRACTURE Left 05/16/2021   Procedure: OPEN REDUCTION INTERNAL FIXATION (ORIF) Left ankle lateral  malleolus, possible deltoid ligament repair;  Surgeon: Wylene Simmer, MD;  Location: [place];  Service: Orthopedics;  Laterality: Left;   OSTEOTOMY Right 11/23/2014   mid foot   PARTIAL THYMECTOMY  1986   ? side   TONSILLECTOMY  1954    There were no vitals filed for this visit.   Subjective Assessment - 12/19/21 1737     Subjective Went to the beach, I really struggled with the rollator with going to [place] and to the beach.  "My endurance sucks".  No stumbles    Currently in Pain? No/denies                               [facility] Adult PT Treatment/Exercise - 12/19/21 0001       Ambulation/Gait   Gait Comments with FWW, outside down slope on pa[place] path and then we negotiated down and then up a curb, needs a lot of cues for safety and CGA/Min A to get up and down safely,, walked halfway around the parking island, had to take one longer break, educate on negotiating thresholds and curbs      Knee/Hip Exercises: Aerobic   Nustep level 5 x 8 minutes      Knee/Hip Exercises: Machines for Strengthening   Cybex Knee Extension 5#  2x10    Cybex Knee Flexion 25# 2x10    Cybex Leg Press 20# x10 both legs, then single legs, then 40# both legs all x 10 each                       PT Short Term Goals - 11/28/21 1537       PT SHORT TERM GOAL #1   Title Pt will be I and compliant with HEP    Baseline Initiated, patient reports she is not following HEP.    Time 3    Period Weeks    Status On-going               PT Long Term Goals - 12/19/21 1819       PT LONG TERM GOAL #1   Title Pt will improve BERG balance without AD to >18 to show improved balance.    Status On-going      PT LONG TERM GOAL #2   Title Pt will improve gait speed and TUG time to show improved gait and balance below 24 seconds    Status Achieved      PT LONG TERM GOAL #3   Title increase LE strength to 4+/5    Status Partially Met                   Plan - 12/19/21  1819     Clinical Impression Statement Patient went on vacation, repoerts that she really struggled with walking and endurance, she reports difficulty going out to eat and getting into places as well as public bathrooms.  We worked a little more on her endurance and her strength, she did well with the leg press , I tried stairs with her, able to go up and down 4" steps step over step with two hand rails easily, down 6" steps not much difficulty, really needed assist to go up 6" steps mod A.    PT Next Visit Plan work on endurance, gait, function    Consulted and Agree with Plan of Care Patient             Patient will benefit from skilled therapeutic intervention in order to improve the following deficits and impairments:  Abnormal gait, Decreased range of motion, Difficulty walking, Decreased endurance, Pain, Decreased activity tolerance, Decreased balance, Decreased strength, Decreased mobility  Visit Diagnosis: Muscle weakness (generalized)  Unsteady gait  Difficulty in walking, not elsewhere classified     Problem List Patient Active Problem List   Diagnosis Date Noted   Closed low lateral malleolus fracture 05/16/2021   Gout 05/16/2021   Hyperlipemia 05/16/2021   Hypertension 05/16/2021   Type II diabetes mellitus ([place]) 05/16/2021   Spinal stenosis of lumbar region 03/10/2019   Cervical myelopathy ([place]) 03/10/2019   Peripheral neuropathy 02/02/2019   Gait abnormality 02/02/2019   Urinary urgency 02/02/2019   Chronic bilateral low back pain without sciatica 02/02/2019   DOE (dyspnea on exertion) 10/05/2017   Bruit of right carotid artery 10/05/2017   Coronary artery calcification seen on CT scan 10/05/2017   Chest pain 10/04/2017   Aortic atherosclerosis ([place]) 10/04/2017   Charcot foot due to diabetes mellitus ([place]) 11/23/2014    Sumner Boast, PT 12/19/2021, 6:40 PM  [place]. [place], [place], [address] Phone: [phone]   Fax:  [phone]  Name: Pamela Snyder MRN: [id number] Date of Birth: 04/11/1946

## 2021-12-20 DIAGNOSIS — E119 Type 2 diabetes mellitus without complications: Secondary | ICD-10-CM | POA: Diagnosis not present

## 2021-12-20 DIAGNOSIS — H25813 Combined forms of age-related cataract, bilateral: Secondary | ICD-10-CM | POA: Diagnosis not present

## 2021-12-23 ENCOUNTER — Encounter: Payer: Self-pay | Admitting: Physical Therapy

## 2021-12-23 ENCOUNTER — Ambulatory Visit: Payer: Medicare Other | Admitting: Physical Therapy

## 2021-12-23 DIAGNOSIS — R2681 Unsteadiness on feet: Secondary | ICD-10-CM | POA: Diagnosis not present

## 2021-12-23 DIAGNOSIS — M6281 Muscle weakness (generalized): Secondary | ICD-10-CM | POA: Diagnosis not present

## 2021-12-23 DIAGNOSIS — R262 Difficulty in walking, not elsewhere classified: Secondary | ICD-10-CM | POA: Diagnosis not present

## 2021-12-23 NOTE — Therapy (Signed)
[place]. [place], [place], [address] Phone: [phone]   Fax:  [phone]  Physical Therapy Treatment  Patient Details  Name: Pamela Snyder MRN: [id number] Date of Birth: 1946/04/24 Referring Provider (PT): Alfredo Martinez   Encounter Date: 12/23/2021   PT End of Session - 12/23/21 1651     Visit Number 15    Date for PT Re-Evaluation 01/22/22    PT Start Time 1651    PT Stop Time 1736    PT Time Calculation (min) 45 min    Equipment Utilized During Treatment Gait belt    Activity Tolerance Patient tolerated treatment well    Behavior During Therapy [facility] for tasks assessed/performed             Past Medical History:  Diagnosis Date   Anxiety    Arthritis    "right foot; spine; hands" (11/23/2014)   Charcot foot due to diabetes mellitus ([place])    Depression    GERD (gastroesophageal reflux disease)    Gout    Hyperlipemia    Hypertension    Migraine    hx   Neuropathy    Numbness    Thyroid goiter 1986   Type II diabetes mellitus ([place])     Past Surgical History:  Procedure Laterality Date   ABDOMINAL HYSTERECTOMY  1999   w/BSO   ACHILLES TENDON LENGTHENING Right 11/23/2014   ACHILLES TENDON LENGTHENING Right 11/23/2014   Procedure: RIGHT ACHILLES PERCUTANEOUS TENDON LENGTHENING;  Surgeon: Wylene Simmer, MD;  Location: [place];  Service: Orthopedics;  Laterality: Right;   [place] Right 11/23/2014   mid foot   CARDIAC CATHETERIZATION  08/2004   FOOT ARTHRODESIS Right 11/23/2014   Procedure: RIGHT MID FOOT ARTHRODESIS;  Surgeon: Wylene Simmer, MD;  Location: [place];  Service: Orthopedics;  Laterality: Right;   METATARSAL OSTEOTOMY Right 11/23/2014   Procedure: RIGHT MID FOOT OSTEOTOMY;  Surgeon: Wylene Simmer, MD;  Location: [place];  Service: Orthopedics;  Laterality: Right;   ORIF ANKLE FRACTURE Left 05/16/2021   Procedure: OPEN REDUCTION INTERNAL FIXATION (ORIF) Left ankle lateral  malleolus, possible deltoid ligament repair;  Surgeon: Wylene Simmer, MD;  Location: [place];  Service: Orthopedics;  Laterality: Left;   OSTEOTOMY Right 11/23/2014   mid foot   PARTIAL THYMECTOMY  1986   ? side   TONSILLECTOMY  1954    There were no vitals filed for this visit.   Subjective Assessment - 12/23/21 1700     Subjective My left leg is a tired and having some difficulty moving today    Currently in Pain? No/denies                               [facility] Adult PT Treatment/Exercise - 12/23/21 0001       High Level Balance   High Level Balance Activities Side stepping;Backward walking    High Level Balance Comments seated beach ball working on core      Knee/Hip Exercises: Aerobic   Nustep level 5 x 8 minutes      Knee/Hip Exercises: Machines for Strengthening   Cybex Knee Extension 5# 3x10    Cybex Knee Flexion 25# 2x10    Cybex Leg Press 20# x10 both legs, then single legs, then 40# both legs all x 10 each      Knee/Hip Exercises: Seated   Knee/Hip Flexion 3# 2x10, standing  hip abdution 3# 2x10    Other Seated Knee/Hip Exercises partial situps ont he mat table    Other Seated Knee/Hip Exercises red tband rows and extensions 2x10, trying to get active ankle motions                       PT Short Term Goals - 11/28/21 1537       PT SHORT TERM GOAL #1   Title Pt will be I and compliant with HEP    Baseline Initiated, patient reports she is not following HEP.    Time 3    Period Weeks    Status On-going               PT Long Term Goals - 12/19/21 1819       PT LONG TERM GOAL #1   Title Pt will improve BERG balance without AD to >18 to show improved balance.    Status On-going      PT LONG TERM GOAL #2   Title Pt will improve gait speed and TUG time to show improved gait and balance below 24 seconds    Status Achieved      PT LONG TERM GOAL #3   Title increase LE strength to 4+/5    Status Partially Met                    Plan - 12/23/21 1741     Clinical Impression Statement minimal to no actoive motions of the ankles especially the right.  very weak left hip.  Needs close CGA/min A with balance and walking    PT Next Visit Plan work on endurance, gait, function    Consulted and Agree with Plan of Care Patient             Patient will benefit from skilled therapeutic intervention in order to improve the following deficits and impairments:  Abnormal gait, Decreased range of motion, Difficulty walking, Decreased endurance, Pain, Decreased activity tolerance, Decreased balance, Decreased strength, Decreased mobility  Visit Diagnosis: Muscle weakness (generalized)  Unsteady gait  Difficulty in walking, not elsewhere classified     Problem List Patient Active Problem List   Diagnosis Date Noted   Closed low lateral malleolus fracture 05/16/2021   Gout 05/16/2021   Hyperlipemia 05/16/2021   Hypertension 05/16/2021   Type II diabetes mellitus ([place]) 05/16/2021   Spinal stenosis of lumbar region 03/10/2019   Cervical myelopathy ([place]) 03/10/2019   Peripheral neuropathy 02/02/2019   Gait abnormality 02/02/2019   Urinary urgency 02/02/2019   Chronic bilateral low back pain without sciatica 02/02/2019   DOE (dyspnea on exertion) 10/05/2017   Bruit of right carotid artery 10/05/2017   Coronary artery calcification seen on CT scan 10/05/2017   Chest pain 10/04/2017   Aortic atherosclerosis ([place]) 10/04/2017   Charcot foot due to diabetes mellitus ([place]) 11/23/2014    Sumner Boast, PT 12/23/2021, 5:42 PM  [place]. [place], [place], [address] Phone: [phone]   Fax:  [phone]  Name: Pamela Snyder MRN: [id number] Date of Birth: [birth year]

## 2021-12-26 ENCOUNTER — Encounter: Payer: Self-pay | Admitting: Physical Therapy

## 2021-12-26 ENCOUNTER — Ambulatory Visit: Payer: Medicare Other | Admitting: Physical Therapy

## 2021-12-26 DIAGNOSIS — R2681 Unsteadiness on feet: Secondary | ICD-10-CM

## 2021-12-26 DIAGNOSIS — M6281 Muscle weakness (generalized): Secondary | ICD-10-CM

## 2021-12-26 DIAGNOSIS — R262 Difficulty in walking, not elsewhere classified: Secondary | ICD-10-CM | POA: Diagnosis not present

## 2021-12-26 NOTE — Therapy (Signed)
[place]. [place], [place], [address] Phone: [phone]   Fax:  [phone]  Physical Therapy Treatment  Patient Details  Name: Pamela Snyder MRN: [id number] Date of Birth: 1945-09-27 Referring Provider (PT): Alfredo Martinez   Encounter Date: 12/26/2021   PT End of Session - 12/26/21 1713     Visit Number 16    Date for PT Re-Evaluation 01/22/22    PT Start Time 1709    PT Stop Time 6759    PT Time Calculation (min) 48 min    Equipment Utilized During Treatment Gait belt    Activity Tolerance Patient tolerated treatment well    Behavior During Therapy [facility] for tasks assessed/performed             Past Medical History:  Diagnosis Date   Anxiety    Arthritis    "right foot; spine; hands" (11/23/2014)   Charcot foot due to diabetes mellitus ([place])    Depression    GERD (gastroesophageal reflux disease)    Gout    Hyperlipemia    Hypertension    Migraine    hx   Neuropathy    Numbness    Thyroid goiter 1986   Type II diabetes mellitus ([place])     Past Surgical History:  Procedure Laterality Date   ABDOMINAL HYSTERECTOMY  1999   w/BSO   ACHILLES TENDON LENGTHENING Right 11/23/2014   ACHILLES TENDON LENGTHENING Right 11/23/2014   Procedure: RIGHT ACHILLES PERCUTANEOUS TENDON LENGTHENING;  Surgeon: Wylene Simmer, MD;  Location: [place];  Service: Orthopedics;  Laterality: Right;   [place] Right 11/23/2014   mid foot   CARDIAC CATHETERIZATION  08/2004   FOOT ARTHRODESIS Right 11/23/2014   Procedure: RIGHT MID FOOT ARTHRODESIS;  Surgeon: Wylene Simmer, MD;  Location: [place];  Service: Orthopedics;  Laterality: Right;   METATARSAL OSTEOTOMY Right 11/23/2014   Procedure: RIGHT MID FOOT OSTEOTOMY;  Surgeon: Wylene Simmer, MD;  Location: [place];  Service: Orthopedics;  Laterality: Right;   ORIF ANKLE FRACTURE Left 05/16/2021   Procedure: OPEN REDUCTION INTERNAL FIXATION (ORIF) Left ankle lateral  malleolus, possible deltoid ligament repair;  Surgeon: Wylene Simmer, MD;  Location: [place];  Service: Orthopedics;  Laterality: Left;   OSTEOTOMY Right 11/23/2014   mid foot   PARTIAL THYMECTOMY  1986   ? side   TONSILLECTOMY  1954    There were no vitals filed for this visit.   Subjective Assessment - 12/26/21 1714     Subjective reports that her left hip continues to giver her problems, she reports that she is tender in the left lateral hip    Currently in Pain? Yes    Pain Score 3     Pain Location Back    Pain Orientation Left;Lower    Pain Descriptors / Indicators Aching;Sore    Aggravating Factors  unsure why it is hurting, just hurting more                               [facility] Adult PT Treatment/Exercise - 12/26/21 0001       Knee/Hip Exercises: Aerobic   Nustep level 5 x 8 minutes      Knee/Hip Exercises: Machines for Strengthening   Cybex Knee Extension 5# 3x10    Cybex Knee Flexion 25# 2x10    Other Machine seated row 15#, lats 15# 2x10  Knee/Hip Exercises: Standing   Hip Flexion Both;2 sets;10 reps    Hip Flexion Limitations 2#    Hip Abduction Both;2 sets;10 reps    Abduction Limitations 2#    Hip Extension Both;2 sets;10 reps    Extension Limitations 2#      Knee/Hip Exercises: Seated   Other Seated Knee/Hip Exercises partial situps ont he mat table    Other Seated Knee/Hip Exercises standing rows very unsteady, seated beach ball                       PT Short Term Goals - 11/28/21 1537       PT SHORT TERM GOAL #1   Title Pt will be I and compliant with HEP    Baseline Initiated, patient reports she is not following HEP.    Time 3    Period Weeks    Status On-going               PT Long Term Goals - 12/26/21 1715       PT LONG TERM GOAL #3   Title increase LE strength to 4+/5    Status Partially Met      PT LONG TERM GOAL #4   Title increase UE strength to 4+/5    Status On-going                    Plan - 12/26/21 1756     Clinical Impression Statement Really struggles with standing without holding on, The neuropathy really causes her do have difficulty with balance.  She was having some left hip and back pain, I tried to back off of the walking a little but still work on strength.    PT Next Visit Plan balance if possible    Consulted and Agree with Plan of Care Patient             Patient will benefit from skilled therapeutic intervention in order to improve the following deficits and impairments:  Abnormal gait, Decreased range of motion, Difficulty walking, Decreased endurance, Pain, Decreased activity tolerance, Decreased balance, Decreased strength, Decreased mobility  Visit Diagnosis: Muscle weakness (generalized)  Unsteady gait  Difficulty in walking, not elsewhere classified     Problem List Patient Active Problem List   Diagnosis Date Noted   Closed low lateral malleolus fracture 05/16/2021   Gout 05/16/2021   Hyperlipemia 05/16/2021   Hypertension 05/16/2021   Type II diabetes mellitus ([place]) 05/16/2021   Spinal stenosis of lumbar region 03/10/2019   Cervical myelopathy ([place]) 03/10/2019   Peripheral neuropathy 02/02/2019   Gait abnormality 02/02/2019   Urinary urgency 02/02/2019   Chronic bilateral low back pain without sciatica 02/02/2019   DOE (dyspnea on exertion) 10/05/2017   Bruit of right carotid artery 10/05/2017   Coronary artery calcification seen on CT scan 10/05/2017   Chest pain 10/04/2017   Aortic atherosclerosis ([place]) 10/04/2017   Charcot foot due to diabetes mellitus ([place]) 11/23/2014    Sumner Boast, PT 12/26/2021, 5:58 PM  [place]. [place], [place], [address] Phone: [phone]   Fax:  [phone]  Name: Pamela Snyder MRN: [id number] Date of Birth: [birth year]

## 2021-12-30 ENCOUNTER — Ambulatory Visit: Payer: Medicare Other | Admitting: Physical Therapy

## 2021-12-30 ENCOUNTER — Encounter: Payer: Self-pay | Admitting: Physical Therapy

## 2021-12-30 DIAGNOSIS — M6281 Muscle weakness (generalized): Secondary | ICD-10-CM

## 2021-12-30 DIAGNOSIS — R262 Difficulty in walking, not elsewhere classified: Secondary | ICD-10-CM

## 2021-12-30 DIAGNOSIS — R2681 Unsteadiness on feet: Secondary | ICD-10-CM | POA: Diagnosis not present

## 2022-01-02 ENCOUNTER — Ambulatory Visit: Payer: Medicare Other | Admitting: Physical Therapy

## 2022-01-02 ENCOUNTER — Encounter: Payer: Self-pay | Admitting: Physical Therapy

## 2022-01-02 DIAGNOSIS — R2681 Unsteadiness on feet: Secondary | ICD-10-CM | POA: Diagnosis not present

## 2022-01-02 DIAGNOSIS — R262 Difficulty in walking, not elsewhere classified: Secondary | ICD-10-CM | POA: Diagnosis not present

## 2022-01-02 DIAGNOSIS — M6281 Muscle weakness (generalized): Secondary | ICD-10-CM

## 2022-01-02 NOTE — Therapy (Signed)
[place]. [place], [place], [address] Phone: [phone]   Fax:  [phone]  Physical Therapy Treatment  Patient Details  Name: Pamela Snyder MRN: [id number] Date of Birth: 1946-04-08 Referring Provider (PT): Alfredo Martinez   Encounter Date: 01/02/2022   PT End of Session - 01/02/22 1648     Visit Number 18    Date for PT Re-Evaluation 01/22/22    PT Start Time 1650    PT Stop Time 1740    PT Time Calculation (min) 50 min    Equipment Utilized During Treatment Gait belt    Activity Tolerance Patient tolerated treatment well    Behavior During Therapy [facility] for tasks assessed/performed             Past Medical History:  Diagnosis Date   Anxiety    Arthritis    "right foot; spine; hands" (11/23/2014)   Charcot foot due to diabetes mellitus ([place])    Depression    GERD (gastroesophageal reflux disease)    Gout    Hyperlipemia    Hypertension    Migraine    hx   Neuropathy    Numbness    Thyroid goiter 1986   Type II diabetes mellitus ([place])     Past Surgical History:  Procedure Laterality Date   ABDOMINAL HYSTERECTOMY  1999   w/BSO   ACHILLES TENDON LENGTHENING Right 11/23/2014   ACHILLES TENDON LENGTHENING Right 11/23/2014   Procedure: RIGHT ACHILLES PERCUTANEOUS TENDON LENGTHENING;  Surgeon: Wylene Simmer, MD;  Location: [place];  Service: Orthopedics;  Laterality: Right;   [place] Right 11/23/2014   mid foot   CARDIAC CATHETERIZATION  08/2004   FOOT ARTHRODESIS Right 11/23/2014   Procedure: RIGHT MID FOOT ARTHRODESIS;  Surgeon: Wylene Simmer, MD;  Location: [place];  Service: Orthopedics;  Laterality: Right;   METATARSAL OSTEOTOMY Right 11/23/2014   Procedure: RIGHT MID FOOT OSTEOTOMY;  Surgeon: Wylene Simmer, MD;  Location: [place];  Service: Orthopedics;  Laterality: Right;   ORIF ANKLE FRACTURE Left 05/16/2021   Procedure: OPEN REDUCTION INTERNAL FIXATION (ORIF) Left ankle lateral  malleolus, possible deltoid ligament repair;  Surgeon: Wylene Simmer, MD;  Location: [place];  Service: Orthopedics;  Laterality: Left;   OSTEOTOMY Right 11/23/2014   mid foot   PARTIAL THYMECTOMY  1986   ? side   TONSILLECTOMY  1954    There were no vitals filed for this visit.   Subjective Assessment - 01/02/22 1654     Subjective Doing well, no pain, no falls, no stumbles    Currently in Pain? No/denies                               [facility] Adult PT Treatment/Exercise - 01/02/22 0001       Ambulation/Gait   Gait Comments with FWW, outside down slope on pa[place] path and then we negotiated down and then up a curb, needs a lot of cues for safety and CGA/Min A to get up and down safely,, walked halfway around the parking island, had to take one longer break, educate on negotiating thresholds and curbs      High Level Balance   High Level Balance Comments seated beach ball working on core, tried standing beach ball balnce very difficult      Knee/Hip Exercises: Aerobic   Nustep level 5 x 8 minutes      Knee/Hip  Exercises: Machines for Strengthening   Cybex Knee Flexion 25# 2x10    Cybex Leg Press 20# x10, 40# 2x10, then 20# lower for more ROM                       PT Short Term Goals - 12/30/21 1738       PT SHORT TERM GOAL #1   Title Pt will be I and compliant with HEP    Baseline Patient reports she is performing various exercises she has been given.    Time 3    Period Weeks    Status Achieved               PT Long Term Goals - 01/02/22 1655       PT LONG TERM GOAL #3   Title increase LE strength to 4+/5    Status Partially Met      PT LONG TERM GOAL #4   Title increase UE strength to 4+/5    Status On-going                   Plan - 01/02/22 1750     Clinical Impression Statement PAtient with minimal to no DF, and minimal PF, she reports that prior to October she had this, She is walking well with the FWW, she has  the biggest issues with any negotiating of objects or curbs, She needs a lot of cues to get close and then to place the walker down or up doing this she is unsteady and needs CGA.  Can do right leg single leg press but streuggles with the left    PT Next Visit Plan work on overall strength and function    Consulted and Agree with Plan of Care Patient             Patient will benefit from skilled therapeutic intervention in order to improve the following deficits and impairments:  Abnormal gait, Decreased range of motion, Difficulty walking, Decreased endurance, Pain, Decreased activity tolerance, Decreased balance, Decreased strength, Decreased mobility  Visit Diagnosis: Muscle weakness (generalized)  Unsteady gait  Difficulty in walking, not elsewhere classified     Problem List Patient Active Problem List   Diagnosis Date Noted   Closed low lateral malleolus fracture 05/16/2021   Gout 05/16/2021   Hyperlipemia 05/16/2021   Hypertension 05/16/2021   Type II diabetes mellitus ([place]) 05/16/2021   Spinal stenosis of lumbar region 03/10/2019   Cervical myelopathy ([place]) 03/10/2019   Peripheral neuropathy 02/02/2019   Gait abnormality 02/02/2019   Urinary urgency 02/02/2019   Chronic bilateral low back pain without sciatica 02/02/2019   DOE (dyspnea on exertion) 10/05/2017   Bruit of right carotid artery 10/05/2017   Coronary artery calcification seen on CT scan 10/05/2017   Chest pain 10/04/2017   Aortic atherosclerosis ([place]) 10/04/2017   Charcot foot due to diabetes mellitus ([place]) 11/23/2014    Sumner Boast, PT 01/02/2022, 5:52 PM  [place]. [place], [place], [address] Phone: [phone]   Fax:  [phone]  Name: Justice Aguirre MRN: [id number] Date of Birth: 1945/08/23

## 2022-01-09 ENCOUNTER — Encounter: Payer: Self-pay | Admitting: Physical Therapy

## 2022-01-09 ENCOUNTER — Ambulatory Visit: Payer: Medicare Other | Attending: Student | Admitting: Physical Therapy

## 2022-01-09 DIAGNOSIS — R2681 Unsteadiness on feet: Secondary | ICD-10-CM | POA: Insufficient documentation

## 2022-01-09 DIAGNOSIS — M6281 Muscle weakness (generalized): Secondary | ICD-10-CM | POA: Diagnosis not present

## 2022-01-09 DIAGNOSIS — R262 Difficulty in walking, not elsewhere classified: Secondary | ICD-10-CM | POA: Diagnosis not present

## 2022-01-09 NOTE — Therapy (Signed)
[place]. [place], [place], [address] Phone: [phone]   Fax:  [phone]  Physical Therapy Treatment  Patient Details  Name: Pamela Snyder MRN: [id number] Date of Birth: 06/17/1946 Referring Provider (PT): Alfredo Martinez   Encounter Date: 01/09/2022   PT End of Session - 01/09/22 1749     Visit Number 19    Date for PT Re-Evaluation 01/22/22    PT Start Time 1700    PT Stop Time 1745    PT Time Calculation (min) 45 min    Equipment Utilized During Treatment Gait belt    Activity Tolerance Patient tolerated treatment well    Behavior During Therapy [facility] for tasks assessed/performed             Past Medical History:  Diagnosis Date   Anxiety    Arthritis    "right foot; spine; hands" (11/23/2014)   Charcot foot due to diabetes mellitus ([place])    Depression    GERD (gastroesophageal reflux disease)    Gout    Hyperlipemia    Hypertension    Migraine    hx   Neuropathy    Numbness    Thyroid goiter 1986   Type II diabetes mellitus ([place])     Past Surgical History:  Procedure Laterality Date   ABDOMINAL HYSTERECTOMY  1999   w/BSO   ACHILLES TENDON LENGTHENING Right 11/23/2014   ACHILLES TENDON LENGTHENING Right 11/23/2014   Procedure: RIGHT ACHILLES PERCUTANEOUS TENDON LENGTHENING;  Surgeon: Wylene Simmer, MD;  Location: [place];  Service: Orthopedics;  Laterality: Right;   [place] Right 11/23/2014   mid foot   CARDIAC CATHETERIZATION  08/2004   FOOT ARTHRODESIS Right 11/23/2014   Procedure: RIGHT MID FOOT ARTHRODESIS;  Surgeon: Wylene Simmer, MD;  Location: [place];  Service: Orthopedics;  Laterality: Right;   METATARSAL OSTEOTOMY Right 11/23/2014   Procedure: RIGHT MID FOOT OSTEOTOMY;  Surgeon: Wylene Simmer, MD;  Location: [place];  Service: Orthopedics;  Laterality: Right;   ORIF ANKLE FRACTURE Left 05/16/2021   Procedure: OPEN REDUCTION INTERNAL FIXATION (ORIF) Left ankle lateral  malleolus, possible deltoid ligament repair;  Surgeon: Wylene Simmer, MD;  Location: [place];  Service: Orthopedics;  Laterality: Left;   OSTEOTOMY Right 11/23/2014   mid foot   PARTIAL THYMECTOMY  1986   ? side   TONSILLECTOMY  1954    There were no vitals filed for this visit.   Subjective Assessment - 01/09/22 1706     Subjective Doing good, just my hip hurts a little. I had a stumble on tuesday but I didn't fall.    Pertinent History reports HNP in the neck and the low back, reports sciatica and some numbness in the hands.  She had right foot surgery 2016 Charcot foot    Currently in Pain? Yes    Pain Location Hip    Pain Orientation Left                               [facility] Adult PT Treatment/Exercise - 01/09/22 0001       Knee/Hip Exercises: Aerobic   Nustep level 5 x 8 minutes      Knee/Hip Exercises: Machines for Strengthening   Cybex Knee Extension 15# 2x10    Cybex Knee Flexion 25# 2x10      Knee/Hip Exercises: Standing   Hip Flexion Both;2 sets;10 reps  1.5#   Hip Abduction Both;2 sets;10 reps;Other (comment)   1.5#   Hip Extension Both;2 sets;10 reps    Extension Limitations --   1.5#     Knee/Hip Exercises: Supine   Bridges Both;2 sets;10 reps    Other Supine Knee/Hip Exercises adductor ball squeeze x10                       PT Short Term Goals - 12/30/21 1738       PT SHORT TERM GOAL #1   Title Pt will be I and compliant with HEP    Baseline Patient reports she is performing various exercises she has been given.    Time 3    Period Weeks    Status Achieved               PT Long Term Goals - 01/02/22 1655       PT LONG TERM GOAL #3   Title increase LE strength to 4+/5    Status Partially Met      PT LONG TERM GOAL #4   Title increase UE strength to 4+/5    Status On-going                   Plan - 01/09/22 1744     Clinical Impression Statement Pt enters today with minimal pain in her left hip.  She says she had an episode of LOB on Tuesday but was able to catch herself from falling. Today we focused on lower extremity strengthening. Pt participated well, she did require verbal cues to maintain upright posture during standing exercises. Requires CGA due to unsteadyness but didn't have any episodes of LOB today. Still limited due to poor balance and lower extremity strength.    Stability/Clinical Decision Making Evolving/Moderate complexity    Clinical Decision Making Moderate    Rehab Potential Fair    PT Frequency 2x / week    PT Duration 6 weeks    PT Treatment/Interventions ADLs/Self Care Home Management;Gait training;Stair training;Functional mobility training;Therapeutic activities;Therapeutic exercise;Balance training;Neuromuscular re-education;Manual techniques;Patient/family education    PT Next Visit Plan Work on overall strength and function             Patient will benefit from skilled therapeutic intervention in order to improve the following deficits and impairments:  Abnormal gait, Decreased range of motion, Difficulty walking, Decreased endurance, Pain, Decreased activity tolerance, Decreased balance, Decreased strength, Decreased mobility  Visit Diagnosis: Muscle weakness (generalized)  Unsteady gait  Difficulty in walking, not elsewhere classified     Problem List Patient Active Problem List   Diagnosis Date Noted   Closed low lateral malleolus fracture 05/16/2021   Gout 05/16/2021   Hyperlipemia 05/16/2021   Hypertension 05/16/2021   Type II diabetes mellitus ([place]) 05/16/2021   Spinal stenosis of lumbar region 03/10/2019   Cervical myelopathy ([place]) 03/10/2019   Peripheral neuropathy 02/02/2019   Gait abnormality 02/02/2019   Urinary urgency 02/02/2019   Chronic bilateral low back pain without sciatica 02/02/2019   DOE (dyspnea on exertion) 10/05/2017   Bruit of right carotid artery 10/05/2017   Coronary artery calcification seen on CT scan  10/05/2017   Chest pain 10/04/2017   Aortic atherosclerosis ([place]) 10/04/2017   Charcot foot due to diabetes mellitus ([place]) 11/23/2014    Clearance Coots, SPTA 01/09/2022, 5:50 PM  [place]. [place], [place], [address] Phone: [phone]   Fax:  [phone]  Name: Pamela Snyder MRN: [id number] Date of Birth: [birth year]

## 2022-01-09 NOTE — Therapy (Signed)
[place]. [place], [place], [address] Phone: [phone]   Fax:  [phone]  Physical Therapy Treatment  Patient Details  Name: Kyleen Villatoro MRN: [id number] Date of Birth: 27-Oct-1945 Referring Provider (PT): Alfredo Martinez   Encounter Date: 01/09/2022   PT End of Session - 01/09/22 1701     Visit Number 19    Date for PT Re-Evaluation 01/22/22    PT Start Time 1700    PT Stop Time 1745    PT Time Calculation (min) 45 min    Activity Tolerance Patient tolerated treatment well    Behavior During Therapy [facility] for tasks assessed/performed             Past Medical History:  Diagnosis Date   Anxiety    Arthritis    "right foot; spine; hands" (11/23/2014)   Charcot foot due to diabetes mellitus ([place])    Depression    GERD (gastroesophageal reflux disease)    Gout    Hyperlipemia    Hypertension    Migraine    hx   Neuropathy    Numbness    Thyroid goiter 1986   Type II diabetes mellitus ([place])     Past Surgical History:  Procedure Laterality Date   ABDOMINAL HYSTERECTOMY  1999   w/BSO   ACHILLES TENDON LENGTHENING Right 11/23/2014   ACHILLES TENDON LENGTHENING Right 11/23/2014   Procedure: RIGHT ACHILLES PERCUTANEOUS TENDON LENGTHENING;  Surgeon: Wylene Simmer, MD;  Location: [place];  Service: Orthopedics;  Laterality: Right;   [place] Right 11/23/2014   mid foot   CARDIAC CATHETERIZATION  08/2004   FOOT ARTHRODESIS Right 11/23/2014   Procedure: RIGHT MID FOOT ARTHRODESIS;  Surgeon: Wylene Simmer, MD;  Location: [place];  Service: Orthopedics;  Laterality: Right;   METATARSAL OSTEOTOMY Right 11/23/2014   Procedure: RIGHT MID FOOT OSTEOTOMY;  Surgeon: Wylene Simmer, MD;  Location: [place];  Service: Orthopedics;  Laterality: Right;   ORIF ANKLE FRACTURE Left 05/16/2021   Procedure: OPEN REDUCTION INTERNAL FIXATION (ORIF) Left ankle lateral malleolus, possible deltoid ligament repair;   Surgeon: Wylene Simmer, MD;  Location: [place];  Service: Orthopedics;  Laterality: Left;   OSTEOTOMY Right 11/23/2014   mid foot   PARTIAL THYMECTOMY  1986   ? side   TONSILLECTOMY  1954    There were no vitals filed for this visit.                                   PT Short Term Goals - 12/30/21 1738       PT SHORT TERM GOAL #1   Title Pt will be I and compliant with HEP    Baseline Patient reports she is performing various exercises she has been given.    Time 3    Period Weeks    Status Achieved               PT Long Term Goals - 01/02/22 1655       PT LONG TERM GOAL #3   Title increase LE strength to 4+/5    Status Partially Met      PT LONG TERM GOAL #4   Title increase UE strength to 4+/5    Status On-going                     Patient will benefit  from skilled therapeutic intervention in order to improve the following deficits and impairments:     Visit Diagnosis: Muscle weakness (generalized)  Unsteady gait  Difficulty in walking, not elsewhere classified     Problem List Patient Active Problem List   Diagnosis Date Noted   Closed low lateral malleolus fracture 05/16/2021   Gout 05/16/2021   Hyperlipemia 05/16/2021   Hypertension 05/16/2021   Type II diabetes mellitus ([place]) 05/16/2021   Spinal stenosis of lumbar region 03/10/2019   Cervical myelopathy ([place]) 03/10/2019   Peripheral neuropathy 02/02/2019   Gait abnormality 02/02/2019   Urinary urgency 02/02/2019   Chronic bilateral low back pain without sciatica 02/02/2019   DOE (dyspnea on exertion) 10/05/2017   Bruit of right carotid artery 10/05/2017   Coronary artery calcification seen on CT scan 10/05/2017   Chest pain 10/04/2017   Aortic atherosclerosis ([place]) 10/04/2017   Charcot foot due to diabetes mellitus ([place]) 11/23/2014    Wilda Wetherell 01/09/2022, 5:01 PM  [place]. [place], [place], [address] Phone: [phone]   Fax:  [phone]  Name: Tanylah Schnoebelen MRN: [id number] Date of Birth: 11-03-1945

## 2022-01-14 ENCOUNTER — Encounter: Payer: Self-pay | Admitting: Physical Therapy

## 2022-01-14 ENCOUNTER — Ambulatory Visit: Payer: Medicare Other | Admitting: Physical Therapy

## 2022-01-14 DIAGNOSIS — R2681 Unsteadiness on feet: Secondary | ICD-10-CM | POA: Diagnosis not present

## 2022-01-14 DIAGNOSIS — M6281 Muscle weakness (generalized): Secondary | ICD-10-CM | POA: Diagnosis not present

## 2022-01-14 DIAGNOSIS — R262 Difficulty in walking, not elsewhere classified: Secondary | ICD-10-CM

## 2022-01-14 NOTE — Therapy (Signed)
[place]. [place], [place], [address] Phone: [phone]   Fax:  [phone] Progress Note Reporting Period 12/03/21 to 01/14/22 for visits 11-20  See note below for Objective Data and Assessment of Progress/Goals.     Physical Therapy Treatment  Patient Details  Name: Pamela Snyder MRN: [id number] Date of Birth: 10/22/1945 Referring Provider (PT): Alfredo Martinez   Encounter Date: 01/14/2022   PT End of Session - 01/14/22 1759     Visit Number 20    Date for PT Re-Evaluation 01/22/22    PT Start Time 1754    PT Stop Time 1842    PT Time Calculation (min) 48 min    Activity Tolerance Patient tolerated treatment well    Behavior During Therapy [facility] for tasks assessed/performed             Past Medical History:  Diagnosis Date   Anxiety    Arthritis    "right foot; spine; hands" (11/23/2014)   Charcot foot due to diabetes mellitus ([place])    Depression    GERD (gastroesophageal reflux disease)    Gout    Hyperlipemia    Hypertension    Migraine    hx   Neuropathy    Numbness    Thyroid goiter 1986   Type II diabetes mellitus ([place])     Past Surgical History:  Procedure Laterality Date   ABDOMINAL HYSTERECTOMY  1999   w/BSO   ACHILLES TENDON LENGTHENING Right 11/23/2014   ACHILLES TENDON LENGTHENING Right 11/23/2014   Procedure: RIGHT ACHILLES PERCUTANEOUS TENDON LENGTHENING;  Surgeon: Wylene Simmer, MD;  Location: [place];  Service: Orthopedics;  Laterality: Right;   [place] Right 11/23/2014   mid foot   CARDIAC CATHETERIZATION  08/2004   FOOT ARTHRODESIS Right 11/23/2014   Procedure: RIGHT MID FOOT ARTHRODESIS;  Surgeon: Wylene Simmer, MD;  Location: [place];  Service: Orthopedics;  Laterality: Right;   METATARSAL OSTEOTOMY Right 11/23/2014   Procedure: RIGHT MID FOOT OSTEOTOMY;  Surgeon: Wylene Simmer, MD;  Location: [place];  Service: Orthopedics;  Laterality: Right;   ORIF ANKLE  FRACTURE Left 05/16/2021   Procedure: OPEN REDUCTION INTERNAL FIXATION (ORIF) Left ankle lateral malleolus, possible deltoid ligament repair;  Surgeon: Wylene Simmer, MD;  Location: [place];  Service: Orthopedics;  Laterality: Left;   OSTEOTOMY Right 11/23/2014   mid foot   PARTIAL THYMECTOMY  1986   ? side   TONSILLECTOMY  1954    There were no vitals filed for this visit.   Subjective Assessment - 01/14/22 1800     Subjective I almost had a fall getting up from rollator and turning around, I did not fall but was very scared, I may want to practice this    Currently in Pain? No/denies                               [facility] Adult PT Treatment/Exercise - 01/14/22 0001       Ambulation/Gait   Gait Comments outside with FWW, slow, with CGA and gait belt, negotiating curbs, she really struggles lowering and bringing the walker up and down the curb, she has extremely poor balance and has a bad left shoulder this makes her throw the walker up fast and not safe      High Level Balance   High Level Balance Comments seated beach ball working on core, Worked on  her standing and doing a 360 turn with the walker staying in place to simulate rollatore turn transfer, also worked on her lifting walker over objects      Knee/Hip Exercises: Aerobic   Nustep level 5 x 8 minutes                       PT Short Term Goals - 12/30/21 1738       PT SHORT TERM GOAL #1   Title Pt will be I and compliant with HEP    Baseline Patient reports she is performing various exercises she has been given.    Time 3    Period Weeks    Status Achieved               PT Long Term Goals - 01/14/22 1801       PT LONG TERM GOAL #1   Title Pt will improve BERG balance without AD to >18 to show improved balance.    Status On-going      PT LONG TERM GOAL #2   Title Pt will improve gait speed and TUG time to show improved gait and balance below 24 seconds    Status Achieved       PT LONG TERM GOAL #3   Title increase LE strength to 4+/5    Status Partially Met                   Plan - 01/14/22 1853     Clinical Impression Statement Patient reports a near fall while getting up from rollator and trying to turn and use it, I practiced with her on this, this was very difficult for her, she also struggles with lifting the walker up and down a curb, has a right shoulder issue that limits the lifting but her poor balance is really an issu as she needs to have the other point of contact,    PT Next Visit Plan work on the funtional transfers    Consulted and Agree with Plan of Care Patient             Patient will benefit from skilled therapeutic intervention in order to improve the following deficits and impairments:  Abnormal gait, Decreased range of motion, Difficulty walking, Decreased endurance, Pain, Decreased activity tolerance, Decreased balance, Decreased strength, Decreased mobility  Visit Diagnosis: Muscle weakness (generalized)  Unsteady gait  Difficulty in walking, not elsewhere classified     Problem List Patient Active Problem List   Diagnosis Date Noted   Closed low lateral malleolus fracture 05/16/2021   Gout 05/16/2021   Hyperlipemia 05/16/2021   Hypertension 05/16/2021   Type II diabetes mellitus ([place]) 05/16/2021   Spinal stenosis of lumbar region 03/10/2019   Cervical myelopathy ([place]) 03/10/2019   Peripheral neuropathy 02/02/2019   Gait abnormality 02/02/2019   Urinary urgency 02/02/2019   Chronic bilateral low back pain without sciatica 02/02/2019   DOE (dyspnea on exertion) 10/05/2017   Bruit of right carotid artery 10/05/2017   Coronary artery calcification seen on CT scan 10/05/2017   Chest pain 10/04/2017   Aortic atherosclerosis ([place]) 10/04/2017   Charcot foot due to diabetes mellitus ([place]) 11/23/2014    Sumner Boast, PT 01/14/2022, 6:55 PM  [place]. [place], [place], [address] Phone: [phone]   Fax:  [phone]  Name: Kaylyn Garrow MRN: [id number] Date of Birth: 11-25-1945

## 2022-01-16 ENCOUNTER — Ambulatory Visit: Payer: Medicare Other | Admitting: Physical Therapy

## 2022-01-16 ENCOUNTER — Encounter: Payer: Self-pay | Admitting: Physical Therapy

## 2022-01-16 DIAGNOSIS — R262 Difficulty in walking, not elsewhere classified: Secondary | ICD-10-CM | POA: Diagnosis not present

## 2022-01-16 DIAGNOSIS — R2681 Unsteadiness on feet: Secondary | ICD-10-CM | POA: Diagnosis not present

## 2022-01-16 DIAGNOSIS — M6281 Muscle weakness (generalized): Secondary | ICD-10-CM

## 2022-01-16 NOTE — Therapy (Signed)
[place] [place], [place], [address] Phone: [phone]   Fax:  [phone]  Physical Therapy Treatment  Patient Details  Name: Pamela Snyder MRN: [id number] Date of Birth: Oct 23, 1945 Referring Provider (PT): Alfredo Martinez   Encounter Date: 01/16/2022   PT End of Session - 01/16/22 1717     Visit Number 21    Date for PT Re-Evaluation 01/22/22    PT Start Time 1712    PT Stop Time 1757    PT Time Calculation (min) 45 min    Equipment Utilized During Treatment Gait belt    Activity Tolerance Patient tolerated treatment well    Behavior During Therapy [facility] for tasks assessed/performed             Past Medical History:  Diagnosis Date   Anxiety    Arthritis    "right foot; spine; hands" (11/23/2014)   Charcot foot due to diabetes mellitus ([place])    Depression    GERD (gastroesophageal reflux disease)    Gout    Hyperlipemia    Hypertension    Migraine    hx   Neuropathy    Numbness    Thyroid goiter 1986   Type II diabetes mellitus ([place])     Past Surgical History:  Procedure Laterality Date   ABDOMINAL HYSTERECTOMY  1999   w/BSO   ACHILLES TENDON LENGTHENING Right 11/23/2014   ACHILLES TENDON LENGTHENING Right 11/23/2014   Procedure: RIGHT ACHILLES PERCUTANEOUS TENDON LENGTHENING;  Surgeon: Wylene Simmer, MD;  Location: [facility];  Service: Orthopedics;  Laterality: Right;   [place] Right 11/23/2014   mid foot   CARDIAC CATHETERIZATION  08/2004   FOOT ARTHRODESIS Right 11/23/2014   Procedure: RIGHT MID FOOT ARTHRODESIS;  Surgeon: Wylene Simmer, MD;  Location: [facility];  Service: Orthopedics;  Laterality: Right;   METATARSAL OSTEOTOMY Right 11/23/2014   Procedure: RIGHT MID FOOT OSTEOTOMY;  Surgeon: Wylene Simmer, MD;  Location: [facility];  Service: Orthopedics;  Laterality: Right;   ORIF ANKLE FRACTURE Left 05/16/2021   Procedure: OPEN REDUCTION INTERNAL FIXATION (ORIF) Left ankle lateral  malleolus, possible deltoid ligament repair;  Surgeon: Wylene Simmer, MD;  Location: [facility];  Service: Orthopedics;  Laterality: Left;   OSTEOTOMY Right 11/23/2014   mid foot   PARTIAL THYMECTOMY  1986   ? side   TONSILLECTOMY  1954    There were no vitals filed for this visit.   Subjective Assessment - 01/16/22 1717     Subjective Patient reports that her abs, left HS and her left hip are sore.  She thinks it was due to her not doing much then we did a lot of exercise last visit, she reports sore started yesterday                               [facility] Adult PT Treatment/Exercise - 01/16/22 0001       Transfers   Comments worked on transfers to and from rollator, this is how she almost fell last week,m biggest issue is the turn around and she feels like she has to have contant hold and she goes too fast, this today was very difficult for her      Ambulation/Gait   Gait Comments outside with FWW, slow, with CGA and gait belt, negotiating curbs, she really struggles lowering and bringing the walker up and down the curb, she has  extremely poor balance and has a bad left shoulder this makes her throw the walker up fast and not safe      High Level Balance   High Level Balance Comments seated beach ball working on core, Worked on her standing and doing a 360 turn with the walker staying in place to simulate rollatore turn transfer, also worked on her lifting walker over objects      Knee/Hip Exercises: Aerobic   Nustep level 5 x 8 minutes      Knee/Hip Exercises: Machines for Strengthening   Cybex Knee Extension 5# 2x15    Cybex Knee Flexion 20# 2x15                       PT Short Term Goals - 12/30/21 1738       PT SHORT TERM GOAL #1   Title Pt will be I and compliant with HEP    Baseline Patient reports she is performing various exercises she has been given.    Time 3    Period Weeks    Status Achieved               PT Long Term Goals -  01/14/22 1801       PT LONG TERM GOAL #1   Title Pt will improve BERG balance without AD to >18 to show improved balance.    Status On-going      PT LONG TERM GOAL #2   Title Pt will improve gait speed and TUG time to show improved gait and balance below 24 seconds    Status Achieved      PT LONG TERM GOAL #3   Title increase LE strength to 4+/5    Status Partially Met                   Plan - 01/16/22 1851     Clinical Impression Statement Patient reports that she was very sore yesterday and is very tired today.  She was able to do the walk and seemed to be a little safer with most of the curb transfers, with the rollator type transfer she really struggled today, we did at least 10 transfers and 6 of them she really needed mod A to prevent a fall    PT Next Visit Plan work on the funtional transfers    [place] and Agree with Plan of Care Patient             Patient will benefit from skilled therapeutic intervention in order to improve the following deficits and impairments:  Abnormal gait, Decreased range of motion, Difficulty walking, Decreased endurance, Pain, Decreased activity tolerance, Decreased balance, Decreased strength, Decreased mobility  Visit Diagnosis: Muscle weakness (generalized)  Unsteady gait  Difficulty in walking, not elsewhere classified     Problem List Patient Active Problem List   Diagnosis Date Noted   Closed low lateral malleolus fracture 05/16/2021   Gout 05/16/2021   Hyperlipemia 05/16/2021   Hypertension 05/16/2021   Type II diabetes mellitus ([place]) 05/16/2021   Spinal stenosis of lumbar region 03/10/2019   Cervical myelopathy ([place]) 03/10/2019   Peripheral neuropathy 02/02/2019   Gait abnormality 02/02/2019   Urinary urgency 02/02/2019   Chronic bilateral low back pain without sciatica 02/02/2019   DOE (dyspnea on exertion) 10/05/2017   Bruit of right carotid artery 10/05/2017   Coronary artery calcification seen on CT  scan 10/05/2017   Chest pain 10/04/2017   Aortic atherosclerosis ([place])  10/04/2017   Charcot foot due to diabetes mellitus ([place]) 11/23/2014    Sumner Boast, PT 01/16/2022, 6:52 PM  [place]. [place], [place], [address] Phone: [phone]   Fax:  [phone]  Name: Pamela Snyder MRN: [id number] Date of Birth: [birth year]

## 2022-01-21 ENCOUNTER — Ambulatory Visit: Payer: Medicare Other | Admitting: Physical Therapy

## 2022-01-21 ENCOUNTER — Encounter: Payer: Self-pay | Admitting: Physical Therapy

## 2022-01-21 DIAGNOSIS — R262 Difficulty in walking, not elsewhere classified: Secondary | ICD-10-CM | POA: Diagnosis not present

## 2022-01-21 DIAGNOSIS — M6281 Muscle weakness (generalized): Secondary | ICD-10-CM

## 2022-01-21 DIAGNOSIS — R2681 Unsteadiness on feet: Secondary | ICD-10-CM

## 2022-01-21 NOTE — Therapy (Signed)
[place] [place], [place], [address] Phone: [phone]   Fax:  [phone]  Physical Therapy Treatment  Patient Details  Name: Pamela Snyder MRN: [id number] Date of Birth: 05/29/1946 Referring Provider (PT): Alfredo Martinez   Encounter Date: 01/21/2022   PT End of Session - 01/21/22 1715     Visit Number 22    Date for PT Re-Evaluation 01/22/22    PT Start Time 1711    PT Stop Time 1800    PT Time Calculation (min) 49 min    Activity Tolerance Patient tolerated treatment well    Behavior During Therapy [facility] for tasks assessed/performed             Past Medical History:  Diagnosis Date   Anxiety    Arthritis    "right foot; spine; hands" (11/23/2014)   Charcot foot due to diabetes mellitus ([place])    Depression    GERD (gastroesophageal reflux disease)    Gout    Hyperlipemia    Hypertension    Migraine    hx   Neuropathy    Numbness    Thyroid goiter 1986   Type II diabetes mellitus ([place])     Past Surgical History:  Procedure Laterality Date   ABDOMINAL HYSTERECTOMY  1999   w/BSO   ACHILLES TENDON LENGTHENING Right 11/23/2014   ACHILLES TENDON LENGTHENING Right 11/23/2014   Procedure: RIGHT ACHILLES PERCUTANEOUS TENDON LENGTHENING;  Surgeon: Wylene Simmer, MD;  Location: [facility];  Service: Orthopedics;  Laterality: Right;   [place] Right 11/23/2014   mid foot   CARDIAC CATHETERIZATION  08/2004   FOOT ARTHRODESIS Right 11/23/2014   Procedure: RIGHT MID FOOT ARTHRODESIS;  Surgeon: Wylene Simmer, MD;  Location: [facility];  Service: Orthopedics;  Laterality: Right;   METATARSAL OSTEOTOMY Right 11/23/2014   Procedure: RIGHT MID FOOT OSTEOTOMY;  Surgeon: Wylene Simmer, MD;  Location: [facility];  Service: Orthopedics;  Laterality: Right;   ORIF ANKLE FRACTURE Left 05/16/2021   Procedure: OPEN REDUCTION INTERNAL FIXATION (ORIF) Left ankle lateral malleolus, possible deltoid ligament repair;   Surgeon: Wylene Simmer, MD;  Location: [facility];  Service: Orthopedics;  Laterality: Left;   OSTEOTOMY Right 11/23/2014   mid foot   PARTIAL THYMECTOMY  1986   ? side   TONSILLECTOMY  1954    There were no vitals filed for this visit.   Subjective Assessment - 01/21/22 1716     Subjective I am doing okay, a little soreness after last visit but I think it was good    Currently in Pain? No/denies                               [facility] Adult PT Treatment/Exercise - 01/21/22 0001       Ambulation/Gait   Gait Comments Outside up and down 4 curbs, she did the best I have seen her do today, 3 out of 4 curbs were good and mostly safe.  She does struggle with balance so when she lifts the walker there is nothing but the neuropthy feet that are giving her feedback      High Level Balance   High Level Balance Comments seated beach ball volley ball      Knee/Hip Exercises: Aerobic   Nustep level 5 x 8 minutes, 1/2 the time with LE's only      Knee/Hip Exercises: Machines for Strengthening  Cybex Knee Extension 10# 3x10    Cybex Knee Flexion 25# 3x10    Cybex Leg Press 20# 2x10    Other Machine seated row 15#, lats 15# 2x10                       PT Short Term Goals - 12/30/21 1738       PT SHORT TERM GOAL #1   Title Pt will be I and compliant with HEP    Baseline Patient reports she is performing various exercises she has been given.    Time 3    Period Weeks    Status Achieved               PT Long Term Goals - 01/14/22 1801       PT LONG TERM GOAL #1   Title Pt will improve BERG balance without AD to >18 to show improved balance.    Status On-going      PT LONG TERM GOAL #2   Title Pt will improve gait speed and TUG time to show improved gait and balance below 24 seconds    Status Achieved      PT LONG TERM GOAL #3   Title increase LE strength to 4+/5    Status Partially Met                   Plan - 01/21/22 1810      Clinical Impression Statement Ms Swiech really did well today wiht her balance and her negotiating the curbs, much safer and less off balance, she still struggles and I would rate 3 of the 4 curb negotiations as safe, not perfect but mostly safe.    PT Next Visit Plan write [email or web address]    Consulted and Agree with Plan of Care Patient             Patient will benefit from skilled therapeutic intervention in order to improve the following deficits and impairments:  Abnormal gait, Decreased range of motion, Difficulty walking, Decreased endurance, Pain, Decreased activity tolerance, Decreased balance, Decreased strength, Decreased mobility  Visit Diagnosis: Muscle weakness (generalized)  Unsteady gait  Difficulty in walking, not elsewhere classified     Problem List Patient Active Problem List   Diagnosis Date Noted   Closed low lateral malleolus fracture 05/16/2021   Gout 05/16/2021   Hyperlipemia 05/16/2021   Hypertension 05/16/2021   Type II diabetes mellitus ([place]) 05/16/2021   Spinal stenosis of lumbar region 03/10/2019   Cervical myelopathy ([place]) 03/10/2019   Peripheral neuropathy 02/02/2019   Gait abnormality 02/02/2019   Urinary urgency 02/02/2019   Chronic bilateral low back pain without sciatica 02/02/2019   DOE (dyspnea on exertion) 10/05/2017   Bruit of right carotid artery 10/05/2017   Coronary artery calcification seen on CT scan 10/05/2017   Chest pain 10/04/2017   Aortic atherosclerosis ([place]) 10/04/2017   Charcot foot due to diabetes mellitus ([place]) 11/23/2014    Sumner Boast, PT 01/21/2022, 6:11 PM  [place]. [place], [place], [address] Phone: [phone]   Fax:  [phone]  Name: Pamela Snyder MRN: [id number] Date of Birth: 1945-07-08

## 2022-01-23 ENCOUNTER — Encounter: Payer: Self-pay | Admitting: Physical Therapy

## 2022-01-23 ENCOUNTER — Ambulatory Visit: Payer: Medicare Other | Admitting: Physical Therapy

## 2022-01-23 DIAGNOSIS — M810 Age-related osteoporosis without current pathological fracture: Secondary | ICD-10-CM | POA: Diagnosis not present

## 2022-01-23 DIAGNOSIS — R2681 Unsteadiness on feet: Secondary | ICD-10-CM

## 2022-01-23 DIAGNOSIS — R5383 Other fatigue: Secondary | ICD-10-CM | POA: Diagnosis not present

## 2022-01-23 DIAGNOSIS — E559 Vitamin D deficiency, unspecified: Secondary | ICD-10-CM | POA: Diagnosis not present

## 2022-01-23 DIAGNOSIS — R262 Difficulty in walking, not elsewhere classified: Secondary | ICD-10-CM

## 2022-01-23 DIAGNOSIS — M6281 Muscle weakness (generalized): Secondary | ICD-10-CM | POA: Diagnosis not present

## 2022-01-23 NOTE — Therapy (Signed)
[place]. [place], [place], [address] Phone: [phone]   Fax:  [phone]  Physical Therapy Treatment  Patient Details  Name: Pamela Snyder MRN: [id number] Date of Birth: 01/28/1946 Referring Provider (PT): Alfredo Martinez   Encounter Date: 01/23/2022   PT End of Session - 01/23/22 1721     Visit Number 23    Date for PT Re-Evaluation 02/23/22    PT Start Time 2761    PT Stop Time 1800    PT Time Calculation (min) 45 min    Equipment Utilized During Treatment Gait belt    Activity Tolerance Patient tolerated treatment well    Behavior During Therapy [facility] for tasks assessed/performed             Past Medical History:  Diagnosis Date   Anxiety    Arthritis    "right foot; spine; hands" (11/23/2014)   Charcot foot due to diabetes mellitus ([place])    Depression    GERD (gastroesophageal reflux disease)    Gout    Hyperlipemia    Hypertension    Migraine    hx   Neuropathy    Numbness    Thyroid goiter 1986   Type II diabetes mellitus ([place])     Past Surgical History:  Procedure Laterality Date   ABDOMINAL HYSTERECTOMY  1999   w/BSO   ACHILLES TENDON LENGTHENING Right 11/23/2014   ACHILLES TENDON LENGTHENING Right 11/23/2014   Procedure: RIGHT ACHILLES PERCUTANEOUS TENDON LENGTHENING;  Surgeon: Wylene Simmer, MD;  Location: [facility];  Service: Orthopedics;  Laterality: Right;   [place] Right 11/23/2014   mid foot   CARDIAC CATHETERIZATION  08/2004   FOOT ARTHRODESIS Right 11/23/2014   Procedure: RIGHT MID FOOT ARTHRODESIS;  Surgeon: Wylene Simmer, MD;  Location: [facility];  Service: Orthopedics;  Laterality: Right;   METATARSAL OSTEOTOMY Right 11/23/2014   Procedure: RIGHT MID FOOT OSTEOTOMY;  Surgeon: Wylene Simmer, MD;  Location: [facility];  Service: Orthopedics;  Laterality: Right;   ORIF ANKLE FRACTURE Left 05/16/2021   Procedure: OPEN REDUCTION INTERNAL FIXATION (ORIF) Left ankle lateral  malleolus, possible deltoid ligament repair;  Surgeon: Wylene Simmer, MD;  Location: [facility];  Service: Orthopedics;  Laterality: Left;   OSTEOTOMY Right 11/23/2014   mid foot   PARTIAL THYMECTOMY  1986   ? side   TONSILLECTOMY  1954    There were no vitals filed for this visit.   Subjective Assessment - 01/23/22 1721     Subjective I have been doing well, feeling a little better, have not had any near falls    Currently in Pain? No/denies                [facility] PT Assessment - 01/23/22 0001       Timed Up and Go Test   Normal TUG (seconds) 21    TUG Comments with FWW                           [facility] Adult PT Treatment/Exercise - 01/23/22 0001       Ambulation/Gait   Gait Comments Outside up and down 4 curbs, she did the best I have seen her do today, 3 out of 4 curbs were good and mostly safe.  She does struggle with balance so when she lifts the walker there is nothing but the neuropthy feet that are giving her feedback  High Level Balance   High Level Balance Comments seated beach ball volley ball      Knee/Hip Exercises: Aerobic   Nustep level 5 x 8 minutes, 1/2 the time with LE's only      Knee/Hip Exercises: Machines for Strengthening   Cybex Knee Extension 10# 3x10    Cybex Knee Flexion 25# 3x10                       PT Short Term Goals - 12/30/21 1738       PT SHORT TERM GOAL #1   Title Pt will be I and compliant with HEP    Baseline Patient reports she is performing various exercises she has been given.    Time 3    Period Weeks    Status Achieved               PT Long Term Goals - 01/23/22 1722       PT LONG TERM GOAL #1   Title Pt will improve BERG balance without AD to >18 to show improved balance.    Status On-going      PT LONG TERM GOAL #2   Title Pt will improve gait speed and TUG time to show improved gait and balance below 24 seconds    Status Achieved      PT LONG TERM GOAL #3   Title increase  LE strength to 4+/5    Status Partially Met      PT LONG TERM GOAL #4   Title increase UE strength to 4+/5    Status Partially Met      PT LONG TERM GOAL #5   Title report able to make a sandwich without sitting    Status Achieved      PT LONG TERM GOAL #6   Title Decrease TUG to <18 seconds.    Status On-going                    Patient will benefit from skilled therapeutic intervention in order to improve the following deficits and impairments:     Visit Diagnosis: Muscle weakness (generalized)  Unsteady gait  Difficulty in walking, not elsewhere classified     Problem List Patient Active Problem List   Diagnosis Date Noted   Closed low lateral malleolus fracture 05/16/2021   Gout 05/16/2021   Hyperlipemia 05/16/2021   Hypertension 05/16/2021   Type II diabetes mellitus ([place]) 05/16/2021   Spinal stenosis of lumbar region 03/10/2019   Cervical myelopathy ([place]) 03/10/2019   Peripheral neuropathy 02/02/2019   Gait abnormality 02/02/2019   Urinary urgency 02/02/2019   Chronic bilateral low back pain without sciatica 02/02/2019   DOE (dyspnea on exertion) 10/05/2017   Bruit of right carotid artery 10/05/2017   Coronary artery calcification seen on CT scan 10/05/2017   Chest pain 10/04/2017   Aortic atherosclerosis ([place]) 10/04/2017   Charcot foot due to diabetes mellitus ([place]) 11/23/2014    Sumner Boast, PT 01/23/2022, 6:36 PM  [place]. [place], [place], [address] Phone: [phone]   Fax:  [phone]  Name: Pamela Snyder MRN: [id number] Date of Birth: 07/20/1945

## 2022-01-27 DIAGNOSIS — E1122 Type 2 diabetes mellitus with diabetic chronic kidney disease: Secondary | ICD-10-CM | POA: Diagnosis not present

## 2022-01-27 DIAGNOSIS — E890 Postprocedural hypothyroidism: Secondary | ICD-10-CM | POA: Diagnosis not present

## 2022-01-27 DIAGNOSIS — M4850XA Collapsed vertebra, not elsewhere classified, site unspecified, initial encounter for fracture: Secondary | ICD-10-CM | POA: Diagnosis not present

## 2022-01-27 DIAGNOSIS — G629 Polyneuropathy, unspecified: Secondary | ICD-10-CM | POA: Diagnosis not present

## 2022-01-27 DIAGNOSIS — I129 Hypertensive chronic kidney disease with stage 1 through stage 4 chronic kidney disease, or unspecified chronic kidney disease: Secondary | ICD-10-CM | POA: Diagnosis not present

## 2022-01-27 DIAGNOSIS — M810 Age-related osteoporosis without current pathological fracture: Secondary | ICD-10-CM | POA: Diagnosis not present

## 2022-01-28 ENCOUNTER — Ambulatory Visit: Payer: Medicare Other | Admitting: Physical Therapy

## 2022-01-28 ENCOUNTER — Encounter: Payer: Self-pay | Admitting: Physical Therapy

## 2022-01-28 DIAGNOSIS — M6281 Muscle weakness (generalized): Secondary | ICD-10-CM | POA: Diagnosis not present

## 2022-01-28 DIAGNOSIS — R2681 Unsteadiness on feet: Secondary | ICD-10-CM

## 2022-01-28 DIAGNOSIS — R262 Difficulty in walking, not elsewhere classified: Secondary | ICD-10-CM

## 2022-01-28 NOTE — Therapy (Signed)
[place]. [place], [place], [address] Phone: [phone]   Fax:  [phone]  Physical Therapy Treatment  Patient Details  Name: Pamela Snyder MRN: [id number] Date of Birth: 1946/06/04 Referring Provider (PT): Alfredo Martinez   Encounter Date: 01/28/2022   PT End of Session - 01/28/22 1722     Visit Number 24    Date for PT Re-Evaluation 02/23/22    PT Start Time 1713    PT Stop Time 1800    PT Time Calculation (min) 47 min    Activity Tolerance Patient tolerated treatment well    Behavior During Therapy [facility] for tasks assessed/performed             Past Medical History:  Diagnosis Date   Anxiety    Arthritis    "right foot; spine; hands" (11/23/2014)   Charcot foot due to diabetes mellitus ([place])    Depression    GERD (gastroesophageal reflux disease)    Gout    Hyperlipemia    Hypertension    Migraine    hx   Neuropathy    Numbness    Thyroid goiter 1986   Type II diabetes mellitus ([place])     Past Surgical History:  Procedure Laterality Date   ABDOMINAL HYSTERECTOMY  1999   w/BSO   ACHILLES TENDON LENGTHENING Right 11/23/2014   ACHILLES TENDON LENGTHENING Right 11/23/2014   Procedure: RIGHT ACHILLES PERCUTANEOUS TENDON LENGTHENING;  Surgeon: Wylene Simmer, MD;  Location: [facility];  Service: Orthopedics;  Laterality: Right;   [place] Right 11/23/2014   mid foot   CARDIAC CATHETERIZATION  08/2004   FOOT ARTHRODESIS Right 11/23/2014   Procedure: RIGHT MID FOOT ARTHRODESIS;  Surgeon: Wylene Simmer, MD;  Location: [place];  Service: Orthopedics;  Laterality: Right;   METATARSAL OSTEOTOMY Right 11/23/2014   Procedure: RIGHT MID FOOT OSTEOTOMY;  Surgeon: Wylene Simmer, MD;  Location: [facility];  Service: Orthopedics;  Laterality: Right;   ORIF ANKLE FRACTURE Left 05/16/2021   Procedure: OPEN REDUCTION INTERNAL FIXATION (ORIF) Left ankle lateral malleolus, possible deltoid ligament repair;   Surgeon: Wylene Simmer, MD;  Location: [facility];  Service: Orthopedics;  Laterality: Left;   OSTEOTOMY Right 11/23/2014   mid foot   PARTIAL THYMECTOMY  1986   ? side   TONSILLECTOMY  1954    There were no vitals filed for this visit.   Subjective Assessment - 01/28/22 1722     Subjective no falls    Currently in Pain? No/denies                               [facility] Adult PT Treatment/Exercise - 01/28/22 0001       Ambulation/Gait   Gait Comments gait HHA in clinic working on faster walking and biggetr steps      High Level Balance   High Level Balance Activities Side stepping;Backward walking    High Level Balance Comments seated beach ball volley ball      Knee/Hip Exercises: Aerobic   Nustep level 5 x 10 minutes      Knee/Hip Exercises: Machines for Strengthening   Cybex Knee Extension 10# 3x10    Cybex Knee Flexion 25# 3x10      Knee/Hip Exercises: Standing   Hip Flexion Both;2 sets;10 reps    Hip Flexion Limitations 2#    Hip Abduction Both;2 sets;10 reps;Other (comment)    Abduction  Limitations 2#    Hip Extension Both;2 sets;10 reps    Extension Limitations 2#      Knee/Hip Exercises: Seated   Knee/Hip Flexion ankle motions passively and trying actively    Other Seated Knee/Hip Exercises partial situps ont he mat table                       PT Short Term Goals - 12/30/21 1738       PT SHORT TERM GOAL #1   Title Pt will be I and compliant with HEP    Baseline Patient reports she is performing various exercises she has been given.    Time 3    Period Weeks    Status Achieved               PT Long Term Goals - 01/23/22 1722       PT LONG TERM GOAL #1   Title Pt will improve BERG balance without AD to >18 to show improved balance.    Status On-going      PT LONG TERM GOAL #2   Title Pt will improve gait speed and TUG time to show improved gait and balance below 24 seconds    Status Achieved      PT LONG TERM GOAL  #3   Title increase LE strength to 4+/5    Status Partially Met      PT LONG TERM GOAL #4   Title increase UE strength to 4+/5    Status Partially Met      PT LONG TERM GOAL #5   Title report able to make a sandwich without sitting    Status Achieved      PT LONG TERM GOAL #6   Title Decrease TUG to <18 seconds.    Status On-going                   Plan - 01/28/22 1840     Clinical Impression Statement Continue to push her limits of balance in a safe manner, work on overal strength and work on her functional mobility, she has issues with these as she has a lot of fear of falling, so far she has done well but at times is close to not being able to stay upright needing close CGA with gait belt for stnading activities    PT Next Visit Plan continue to push her    Consulted and Agree with Plan of Care Patient             Patient will benefit from skilled therapeutic intervention in order to improve the following deficits and impairments:  Abnormal gait, Decreased range of motion, Difficulty walking, Decreased endurance, Pain, Decreased activity tolerance, Decreased balance, Decreased strength, Decreased mobility  Visit Diagnosis: Muscle weakness (generalized)  Unsteady gait  Difficulty in walking, not elsewhere classified     Problem List Patient Active Problem List   Diagnosis Date Noted   Closed low lateral malleolus fracture 05/16/2021   Gout 05/16/2021   Hyperlipemia 05/16/2021   Hypertension 05/16/2021   Type II diabetes mellitus ([place]) 05/16/2021   Spinal stenosis of lumbar region 03/10/2019   Cervical myelopathy ([place]) 03/10/2019   Peripheral neuropathy 02/02/2019   Gait abnormality 02/02/2019   Urinary urgency 02/02/2019   Chronic bilateral low back pain without sciatica 02/02/2019   DOE (dyspnea on exertion) 10/05/2017   Bruit of right carotid artery 10/05/2017   Coronary artery calcification seen on CT scan 10/05/2017  Chest pain 10/04/2017    Aortic atherosclerosis ([place]) 10/04/2017   Charcot foot due to diabetes mellitus ([place]) 11/23/2014    Sumner Boast, PT 01/28/2022, 6:42 PM  [place]. [place], [place], [address] Phone: [phone]   Fax:  [phone]  Name: Pamela Snyder MRN: [id number] Date of Birth: Jun 04, 1946

## 2022-01-30 ENCOUNTER — Ambulatory Visit: Payer: Medicare Other | Admitting: Physical Therapy

## 2022-01-30 ENCOUNTER — Encounter: Payer: Self-pay | Admitting: Physical Therapy

## 2022-01-30 DIAGNOSIS — R262 Difficulty in walking, not elsewhere classified: Secondary | ICD-10-CM

## 2022-01-30 DIAGNOSIS — M6281 Muscle weakness (generalized): Secondary | ICD-10-CM

## 2022-01-30 DIAGNOSIS — R2681 Unsteadiness on feet: Secondary | ICD-10-CM

## 2022-01-31 NOTE — Therapy (Signed)
[place]. [place], [place], [address] Phone: [phone]   Fax:  [phone]  Physical Therapy Treatment  Patient Details  Name: Pamela Snyder MRN: [id number] Date of Birth: 04-Aug-1945 Referring Provider (PT): Alfredo Martinez   Encounter Date: 01/30/2022   PT End of Session - 01/30/22 1709     Visit Number 25    Date for PT Re-Evaluation 02/23/22    PT Start Time 1708    PT Stop Time 1755    PT Time Calculation (min) 47 min    Activity Tolerance Patient tolerated treatment well    Behavior During Therapy [facility] for tasks assessed/performed             Past Medical History:  Diagnosis Date   Anxiety    Arthritis    "right foot; spine; hands" (11/23/2014)   Charcot foot due to diabetes mellitus ([place])    Depression    GERD (gastroesophageal reflux disease)    Gout    Hyperlipemia    Hypertension    Migraine    hx   Neuropathy    Numbness    Thyroid goiter 1986   Type II diabetes mellitus ([place])     Past Surgical History:  Procedure Laterality Date   ABDOMINAL HYSTERECTOMY  1999   w/BSO   ACHILLES TENDON LENGTHENING Right 11/23/2014   ACHILLES TENDON LENGTHENING Right 11/23/2014   Procedure: RIGHT ACHILLES PERCUTANEOUS TENDON LENGTHENING;  Surgeon: Wylene Simmer, MD;  Location: [place];  Service: Orthopedics;  Laterality: Right;   [place] Right 11/23/2014   mid foot   CARDIAC CATHETERIZATION  08/2004   FOOT ARTHRODESIS Right 11/23/2014   Procedure: RIGHT MID FOOT ARTHRODESIS;  Surgeon: Wylene Simmer, MD;  Location: [place];  Service: Orthopedics;  Laterality: Right;   METATARSAL OSTEOTOMY Right 11/23/2014   Procedure: RIGHT MID FOOT OSTEOTOMY;  Surgeon: Wylene Simmer, MD;  Location: [place];  Service: Orthopedics;  Laterality: Right;   ORIF ANKLE FRACTURE Left 05/16/2021   Procedure: OPEN REDUCTION INTERNAL FIXATION (ORIF) Left ankle lateral malleolus, possible deltoid ligament repair;   Surgeon: Wylene Simmer, MD;  Location: [place];  Service: Orthopedics;  Laterality: Left;   OSTEOTOMY Right 11/23/2014   mid foot   PARTIAL THYMECTOMY  1986   ? side   TONSILLECTOMY  1954    There were no vitals filed for this visit.   Subjective Assessment - 01/30/22 1710     Subjective Patient reports that she has not slept well for the past few days.  She reports that she is very tired    Currently in Pain? No/denies                                          PT Short Term Goals - 12/30/21 1738       PT SHORT TERM GOAL #1   Title Pt will be I and compliant with HEP    Baseline Patient reports she is performing various exercises she has been given.    Time 3    Period Weeks    Status Achieved               PT Long Term Goals - 01/30/22 1713       PT LONG TERM GOAL #1   Title Pt will improve BERG balance without AD to >18 to  show improved balance.    Status On-going      PT LONG TERM GOAL #2   Title Pt will improve gait speed and TUG time to show improved gait and balance below 24 seconds    Status Achieved                   Plan - 01/30/22 0828     Clinical Impression Statement Continues to have some difficulty with balance, she is working hard and is getting stronger, we did a little more walking today and continued to try to do the functional transfers    PT Next Visit Plan continue to push her    Consulted and Agree with Plan of Care Patient             Patient will benefit from skilled therapeutic intervention in order to improve the following deficits and impairments:  Abnormal gait, Decreased range of motion, Difficulty walking, Decreased endurance, Pain, Decreased activity tolerance, Decreased balance, Decreased strength, Decreased mobility  Visit Diagnosis: Muscle weakness (generalized)  Unsteady gait  Difficulty in walking, not elsewhere classified     Problem List Patient Active Problem List    Diagnosis Date Noted   Closed low lateral malleolus fracture 05/16/2021   Gout 05/16/2021   Hyperlipemia 05/16/2021   Hypertension 05/16/2021   Type II diabetes mellitus ([place]) 05/16/2021   Spinal stenosis of lumbar region 03/10/2019   Cervical myelopathy ([place]) 03/10/2019   Peripheral neuropathy 02/02/2019   Gait abnormality 02/02/2019   Urinary urgency 02/02/2019   Chronic bilateral low back pain without sciatica 02/02/2019   DOE (dyspnea on exertion) 10/05/2017   Bruit of right carotid artery 10/05/2017   Coronary artery calcification seen on CT scan 10/05/2017   Chest pain 10/04/2017   Aortic atherosclerosis ([place]) 10/04/2017   Charcot foot due to diabetes mellitus ([place]) 11/23/2014    Sumner Boast, PT 01/31/2022, 8:30 AM  [place]. [place], [place], [address] Phone: [phone]   Fax:  [phone]  Name: Pamela Snyder MRN: [id number] Date of Birth: 12-01-1945

## 2022-02-05 ENCOUNTER — Encounter: Payer: Self-pay | Admitting: Physical Therapy

## 2022-02-05 ENCOUNTER — Ambulatory Visit: Payer: Medicare Other | Attending: Student | Admitting: Physical Therapy

## 2022-02-05 DIAGNOSIS — R2681 Unsteadiness on feet: Secondary | ICD-10-CM | POA: Diagnosis not present

## 2022-02-05 DIAGNOSIS — R5383 Other fatigue: Secondary | ICD-10-CM | POA: Diagnosis not present

## 2022-02-05 DIAGNOSIS — M810 Age-related osteoporosis without current pathological fracture: Secondary | ICD-10-CM | POA: Diagnosis not present

## 2022-02-05 DIAGNOSIS — M6281 Muscle weakness (generalized): Secondary | ICD-10-CM

## 2022-02-05 DIAGNOSIS — M25562 Pain in left knee: Secondary | ICD-10-CM | POA: Diagnosis not present

## 2022-02-05 DIAGNOSIS — M25561 Pain in right knee: Secondary | ICD-10-CM | POA: Diagnosis not present

## 2022-02-05 DIAGNOSIS — R262 Difficulty in walking, not elsewhere classified: Secondary | ICD-10-CM | POA: Diagnosis not present

## 2022-02-05 NOTE — Therapy (Signed)
[place]. [place], [place], [address] Phone: [phone]   Fax:  [phone]  Physical Therapy Treatment  Patient Details  Name: Pamela Snyder MRN: [id number] Date of Birth: 12-07-1945 Referring Provider (PT): Alfredo Martinez   Encounter Date: 02/05/2022   PT End of Session - 02/05/22 1722     Visit Number 26    Date for PT Re-Evaluation 02/23/22    PT Start Time 1717    PT Stop Time 1755    PT Time Calculation (min) 38 min    Activity Tolerance Patient tolerated treatment well    Behavior During Therapy [facility] for tasks assessed/performed             Past Medical History:  Diagnosis Date   Anxiety    Arthritis    "right foot; spine; hands" (11/23/2014)   Charcot foot due to diabetes mellitus ([place])    Depression    GERD (gastroesophageal reflux disease)    Gout    Hyperlipemia    Hypertension    Migraine    hx   Neuropathy    Numbness    Thyroid goiter 1986   Type II diabetes mellitus ([place])     Past Surgical History:  Procedure Laterality Date   ABDOMINAL HYSTERECTOMY  1999   w/BSO   ACHILLES TENDON LENGTHENING Right 11/23/2014   ACHILLES TENDON LENGTHENING Right 11/23/2014   Procedure: RIGHT ACHILLES PERCUTANEOUS TENDON LENGTHENING;  Surgeon: Wylene Simmer, MD;  Location: [place];  Service: Orthopedics;  Laterality: Right;   [place] Right 11/23/2014   mid foot   CARDIAC CATHETERIZATION  08/2004   FOOT ARTHRODESIS Right 11/23/2014   Procedure: RIGHT MID FOOT ARTHRODESIS;  Surgeon: Wylene Simmer, MD;  Location: [place];  Service: Orthopedics;  Laterality: Right;   METATARSAL OSTEOTOMY Right 11/23/2014   Procedure: RIGHT MID FOOT OSTEOTOMY;  Surgeon: Wylene Simmer, MD;  Location: [place];  Service: Orthopedics;  Laterality: Right;   ORIF ANKLE FRACTURE Left 05/16/2021   Procedure: OPEN REDUCTION INTERNAL FIXATION (ORIF) Left ankle lateral malleolus, possible deltoid ligament repair;   Surgeon: Wylene Simmer, MD;  Location: [place];  Service: Orthopedics;  Laterality: Left;   OSTEOTOMY Right 11/23/2014   mid foot   PARTIAL THYMECTOMY  1986   ? side   TONSILLECTOMY  1954    There were no vitals filed for this visit.   Subjective Assessment - 02/05/22 1721     Subjective Patient reports that she is still not sleeping well. She visited the Dr today who told her the bump on her knee is a lypoma so to leave it alone unless it hurts.    Pertinent History reports HNP in the neck and the low back, reports sciatica and some numbness in the hands.  She had right foot surgery 2016 Charcot foot    How long can you stand comfortably? 3-5 minutes    How long can you walk comfortably? 50-70 feet    Patient Stated Goals walk normally with a rollator, feel stronger and have more indepdnence    Currently in Pain? No/denies    Pain Onset More than a month ago                               [facility] Adult PT Treatment/Exercise - 02/05/22 0001       Ambulation/Gait   Gait Comments Ambulated 200' with RW and  S, min VC for upright posture, relaxed shoulders and decreased reliance on BUE.      Knee/Hip Exercises: Aerobic   Nustep level 5 x 10 minutes   Including 1 x 30 sec pushing with arms and 1 x 30 sec pulling     Knee/Hip Exercises: Seated   Other Seated Knee/Hip Exercises Seated postural control exercises. Hold upright posture while reaching 1# ball out and back 2 x 10 reps.    Sit to Sand 1 set;10 reps;with UE support   Patient encouraged to weight shift forward to facilitate standing.     Ankle Exercises: Seated   Other Seated Ankle Exercises DF/PF, Inv/Ever, occasioanl min A 10 reps each.                       PT Short Term Goals - 12/30/21 1738       PT SHORT TERM GOAL #1   Title Pt will be I and compliant with HEP    Baseline Patient reports she is performing various exercises she has been given.    Time 3    Period Weeks    Status  Achieved               PT Long Term Goals - 02/05/22 1724       PT LONG TERM GOAL #1   Title Pt will improve BERG balance without AD to >18 to show improved balance.    Status On-going      PT LONG TERM GOAL #2   Title Pt will improve gait speed and TUG time to show improved gait and balance below 24 seconds    Status Achieved      PT LONG TERM GOAL #3   Status On-going      PT LONG TERM GOAL #4   Status On-going      PT LONG TERM GOAL #6   Status On-going                   Plan - 02/05/22 1722     Clinical Impression Statement Patient reports that she is still not sleeping well. Treatment focused on strength and balance training, Patient tolerated treatment well, participated well.    PT Treatment/Interventions ADLs/Self Care Home Management;Gait training;Stair training;Functional mobility training;Therapeutic activities;Therapeutic exercise;Balance training;Neuromuscular re-education;Manual techniques;Patient/family education    PT Next Visit Plan continue to push her    PT Home Exercise Plan [email or web address]    Consulted and Agree with Plan of Care Patient             Patient will benefit from skilled therapeutic intervention in order to improve the following deficits and impairments:  Abnormal gait, Decreased range of motion, Difficulty walking, Decreased endurance, Pain, Decreased activity tolerance, Decreased balance, Decreased strength, Decreased mobility  Visit Diagnosis: Muscle weakness (generalized)  Unsteady gait  Difficulty in walking, not elsewhere classified     Problem List Patient Active Problem List   Diagnosis Date Noted   Closed low lateral malleolus fracture 05/16/2021   Gout 05/16/2021   Hyperlipemia 05/16/2021   Hypertension 05/16/2021   Type II diabetes mellitus ([place]) 05/16/2021   Spinal stenosis of lumbar region 03/10/2019   Cervical myelopathy ([place]) 03/10/2019   Peripheral neuropathy 02/02/2019   Gait abnormality 02/02/2019    Urinary urgency 02/02/2019   Chronic bilateral low back pain without sciatica 02/02/2019   DOE (dyspnea on exertion) 10/05/2017   Bruit of right carotid artery 10/05/2017   Coronary artery calcification seen  on CT scan 10/05/2017   Chest pain 10/04/2017   Aortic atherosclerosis ([place]) 10/04/2017   Charcot foot due to diabetes mellitus ([place]) 11/23/2014    Marcelina Morel, DPT 02/05/2022, 5:56 PM  [place]. [place], [place], [address] Phone: [phone]   Fax:  [phone]  Name: Pamela Snyder MRN: [id number] Date of Birth: July 23, 1945

## 2022-02-06 ENCOUNTER — Ambulatory Visit: Payer: Medicare Other | Admitting: Physical Therapy

## 2022-02-06 ENCOUNTER — Encounter: Payer: Self-pay | Admitting: Physical Therapy

## 2022-02-06 DIAGNOSIS — R2681 Unsteadiness on feet: Secondary | ICD-10-CM

## 2022-02-06 DIAGNOSIS — M6281 Muscle weakness (generalized): Secondary | ICD-10-CM | POA: Diagnosis not present

## 2022-02-06 DIAGNOSIS — R262 Difficulty in walking, not elsewhere classified: Secondary | ICD-10-CM | POA: Diagnosis not present

## 2022-02-06 NOTE — Therapy (Signed)
[place]. [place], [place], [address] Phone: [phone]   Fax:  [phone]  Physical Therapy Treatment  Patient Details  Name: Pamela Snyder MRN: [id number] Date of Birth: 1945/09/15 Referring Provider (PT): Alfredo Martinez   Encounter Date: 02/06/2022   PT End of Session - 02/06/22 1723     Visit Number 27    Date for PT Re-Evaluation 02/23/22    PT Start Time 4742    PT Stop Time 1800    PT Time Calculation (min) 45 min    Activity Tolerance Patient tolerated treatment well    Behavior During Therapy [facility] for tasks assessed/performed             Past Medical History:  Diagnosis Date   Anxiety    Arthritis    "right foot; spine; hands" (11/23/2014)   Charcot foot due to diabetes mellitus ([place])    Depression    GERD (gastroesophageal reflux disease)    Gout    Hyperlipemia    Hypertension    Migraine    hx   Neuropathy    Numbness    Thyroid goiter 1986   Type II diabetes mellitus ([place])     Past Surgical History:  Procedure Laterality Date   ABDOMINAL HYSTERECTOMY  1999   w/BSO   ACHILLES TENDON LENGTHENING Right 11/23/2014   ACHILLES TENDON LENGTHENING Right 11/23/2014   Procedure: RIGHT ACHILLES PERCUTANEOUS TENDON LENGTHENING;  Surgeon: Wylene Simmer, MD;  Location: [place];  Service: Orthopedics;  Laterality: Right;   [place] Right 11/23/2014   mid foot   CARDIAC CATHETERIZATION  08/2004   FOOT ARTHRODESIS Right 11/23/2014   Procedure: RIGHT MID FOOT ARTHRODESIS;  Surgeon: Wylene Simmer, MD;  Location: [place];  Service: Orthopedics;  Laterality: Right;   METATARSAL OSTEOTOMY Right 11/23/2014   Procedure: RIGHT MID FOOT OSTEOTOMY;  Surgeon: Wylene Simmer, MD;  Location: [place];  Service: Orthopedics;  Laterality: Right;   ORIF ANKLE FRACTURE Left 05/16/2021   Procedure: OPEN REDUCTION INTERNAL FIXATION (ORIF) Left ankle lateral malleolus, possible deltoid ligament repair;   Surgeon: Wylene Simmer, MD;  Location: [place];  Service: Orthopedics;  Laterality: Left;   OSTEOTOMY Right 11/23/2014   mid foot   PARTIAL THYMECTOMY  1986   ? side   TONSILLECTOMY  1954    There were no vitals filed for this visit.   Subjective Assessment - 02/06/22 1724     Subjective Just a little yucky today, ate some bad sausage, no stumbles    Currently in Pain? Yes    Pain Score 1     Pain Location Back    Pain Orientation Lower    Pain Descriptors / Indicators Sore                               [facility] Adult PT Treatment/Exercise - 02/06/22 0001       Ambulation/Gait   Gait Comments gait with FWW, gait belt CGA down a curb, then out the back door and around to the front      Knee/Hip Exercises: Aerobic   Nustep level 5 x 10 minutes      Knee/Hip Exercises: Machines for Strengthening   Cybex Knee Extension 10# 3x10    Cybex Knee Flexion 25# 3x10    Cybex Leg Press 20# 2x10      Knee/Hip Exercises: Seated   Other Seated  Knee/Hip Exercises tried estim through [place] head to see if I could stimulte anterior tib                       PT Short Term Goals - 12/30/21 1738       PT SHORT TERM GOAL #1   Title Pt will be I and compliant with HEP    Baseline Patient reports she is performing various exercises she has been given.    Time 3    Period Weeks    Status Achieved               PT Long Term Goals - 02/05/22 1724       PT LONG TERM GOAL #1   Title Pt will improve BERG balance without AD to >18 to show improved balance.    Status On-going      PT LONG TERM GOAL #2   Title Pt will improve gait speed and TUG time to show improved gait and balance below 24 seconds    Status Achieved      PT LONG TERM GOAL #3   Status On-going      PT LONG TERM GOAL #4   Status On-going      PT LONG TERM GOAL #6   Status On-going                   Plan - 02/06/22 1851     Clinical Impression Statement Tried to use estim  to stimulate the anterior tibialis, I could not get any mm activation with this.  We walked outside down a curb and then around the side of the building to back into our front door, this is >250 feet with only minimal standing rest break    PT Next Visit Plan continue to push her walking, not sure that the anterior tibialis will get anything, so needs to be functional, safe and independent as she lives alone    Consulted and Agree with Plan of Care Patient             Patient will benefit from skilled therapeutic intervention in order to improve the following deficits and impairments:  Abnormal gait, Decreased range of motion, Difficulty walking, Decreased endurance, Pain, Decreased activity tolerance, Decreased balance, Decreased strength, Decreased mobility  Visit Diagnosis: Muscle weakness (generalized)  Unsteady gait  Difficulty in walking, not elsewhere classified     Problem List Patient Active Problem List   Diagnosis Date Noted   Closed low lateral malleolus fracture 05/16/2021   Gout 05/16/2021   Hyperlipemia 05/16/2021   Hypertension 05/16/2021   Type II diabetes mellitus ([place]) 05/16/2021   Spinal stenosis of lumbar region 03/10/2019   Cervical myelopathy ([place]) 03/10/2019   Peripheral neuropathy 02/02/2019   Gait abnormality 02/02/2019   Urinary urgency 02/02/2019   Chronic bilateral low back pain without sciatica 02/02/2019   DOE (dyspnea on exertion) 10/05/2017   Bruit of right carotid artery 10/05/2017   Coronary artery calcification seen on CT scan 10/05/2017   Chest pain 10/04/2017   Aortic atherosclerosis ([place]) 10/04/2017   Charcot foot due to diabetes mellitus ([place]) 11/23/2014    Sumner Boast, PT 02/06/2022, 6:53 PM  [place]. [place], [place], [address] Phone: [phone]   Fax:  [phone]  Name: Pamela Snyder MRN: [id number] Date of Birth: 07-07-1946

## 2022-02-10 DIAGNOSIS — L905 Scar conditions and fibrosis of skin: Secondary | ICD-10-CM | POA: Diagnosis not present

## 2022-02-10 DIAGNOSIS — L821 Other seborrheic keratosis: Secondary | ICD-10-CM | POA: Diagnosis not present

## 2022-02-10 DIAGNOSIS — D1801 Hemangioma of skin and subcutaneous tissue: Secondary | ICD-10-CM | POA: Diagnosis not present

## 2022-02-10 DIAGNOSIS — L918 Other hypertrophic disorders of the skin: Secondary | ICD-10-CM | POA: Diagnosis not present

## 2022-02-12 ENCOUNTER — Encounter: Payer: Self-pay | Admitting: Physical Therapy

## 2022-02-12 ENCOUNTER — Ambulatory Visit: Payer: Medicare Other | Admitting: Physical Therapy

## 2022-02-12 DIAGNOSIS — M6281 Muscle weakness (generalized): Secondary | ICD-10-CM | POA: Diagnosis not present

## 2022-02-12 DIAGNOSIS — R262 Difficulty in walking, not elsewhere classified: Secondary | ICD-10-CM | POA: Diagnosis not present

## 2022-02-12 DIAGNOSIS — R2681 Unsteadiness on feet: Secondary | ICD-10-CM | POA: Diagnosis not present

## 2022-02-12 NOTE — Therapy (Signed)
[place]. [place], [place], [address] Phone: [phone]   Fax:  [phone]  Physical Therapy Treatment  Patient Details  Name: Pamela Snyder MRN: [id number] Date of Birth: 03-Jun-1946 Referring Provider (PT): Alfredo Martinez   Encounter Date: 02/12/2022   PT End of Session - 02/12/22 1703     Visit Number 28    Date for PT Re-Evaluation 02/23/22    PT Start Time 1700    PT Stop Time 1745    PT Time Calculation (min) 45 min    Equipment Utilized During Treatment Gait belt    Activity Tolerance Patient tolerated treatment well    Behavior During Therapy [facility] for tasks assessed/performed             Past Medical History:  Diagnosis Date   Anxiety    Arthritis    "right foot; spine; hands" (11/23/2014)   Charcot foot due to diabetes mellitus ([place])    Depression    GERD (gastroesophageal reflux disease)    Gout    Hyperlipemia    Hypertension    Migraine    hx   Neuropathy    Numbness    Thyroid goiter 1986   Type II diabetes mellitus ([place])     Past Surgical History:  Procedure Laterality Date   ABDOMINAL HYSTERECTOMY  1999   w/BSO   ACHILLES TENDON LENGTHENING Right 11/23/2014   ACHILLES TENDON LENGTHENING Right 11/23/2014   Procedure: RIGHT ACHILLES PERCUTANEOUS TENDON LENGTHENING;  Surgeon: Wylene Simmer, MD;  Location: [place];  Service: Orthopedics;  Laterality: Right;   [place] Right 11/23/2014   mid foot   CARDIAC CATHETERIZATION  08/2004   FOOT ARTHRODESIS Right 11/23/2014   Procedure: RIGHT MID FOOT ARTHRODESIS;  Surgeon: Wylene Simmer, MD;  Location: [place];  Service: Orthopedics;  Laterality: Right;   METATARSAL OSTEOTOMY Right 11/23/2014   Procedure: RIGHT MID FOOT OSTEOTOMY;  Surgeon: Wylene Simmer, MD;  Location: [place];  Service: Orthopedics;  Laterality: Right;   ORIF ANKLE FRACTURE Left 05/16/2021   Procedure: OPEN REDUCTION INTERNAL FIXATION (ORIF) Left ankle lateral  malleolus, possible deltoid ligament repair;  Surgeon: Wylene Simmer, MD;  Location: [place];  Service: Orthopedics;  Laterality: Left;   OSTEOTOMY Right 11/23/2014   mid foot   PARTIAL THYMECTOMY  1986   ? side   TONSILLECTOMY  1954    There were no vitals filed for this visit.   Subjective Assessment - 02/12/22 1704     Subjective Reports a little tired, she reports rushing around some to get here.  No reports of falls or stumbles    Currently in Pain? No/denies                               [facility] Adult PT Treatment/Exercise - 02/12/22 0001       Ambulation/Gait   Gait Comments ambulation out side halfway around the back parking island, then rest and picnic table and then the rest of the way around the island, had to negotiate 4 curbs with CGA/min A      High Level Balance   High Level Balance Comments seated beach ball volley ball      Knee/Hip Exercises: Aerobic   Nustep level 5 x 7 minutes      Knee/Hip Exercises: Seated   Other Seated Knee/Hip Exercises pball in lap isometrics  PT Short Term Goals - 12/30/21 1738       PT SHORT TERM GOAL #1   Title Pt will be I and compliant with HEP    Baseline Patient reports she is performing various exercises she has been given.    Time 3    Period Weeks    Status Achieved               PT Long Term Goals - 02/12/22 1846       PT LONG TERM GOAL #1   Title Pt will improve BERG balance without AD to >18 to show improved balance.    Status On-going      PT LONG TERM GOAL #2   Title Pt will improve gait speed and TUG time to show improved gait and balance below 24 seconds    Status Achieved      PT LONG TERM GOAL #3   Title increase LE strength to 4+/5    Status On-going      PT LONG TERM GOAL #4   Title increase UE strength to 4+/5    Status On-going      PT LONG TERM GOAL #5   Title report able to make a sandwich without sitting    Status Achieved       PT LONG TERM GOAL #6   Title Decrease TUG to <18 seconds.    Status On-going                   Plan - 02/12/22 1846     Clinical Impression Statement I worked on increasing her gait distance today, she did well with the walking on the pavement, the curb negotiating is the bigest issues and had one time where she was unsafe and needed Min A to help with this, the other few she was able to do with SBA.  I am not sure the ankles will improve with ROM and strength, however she is making good progress in strengthening LE's and walking more and becoming more functional so we will continue to push this to her max level    PT Next Visit Plan continue to push her walking, not sure that the anterior tibialis will get anything, so needs to be functional, safe and independent as she lives alone    Consulted and Agree with Plan of Care Patient             Patient will benefit from skilled therapeutic intervention in order to improve the following deficits and impairments:  Abnormal gait, Decreased range of motion, Difficulty walking, Decreased endurance, Pain, Decreased activity tolerance, Decreased balance, Decreased strength, Decreased mobility  Visit Diagnosis: Muscle weakness (generalized)  Unsteady gait  Difficulty in walking, not elsewhere classified     Problem List Patient Active Problem List   Diagnosis Date Noted   Closed low lateral malleolus fracture 05/16/2021   Gout 05/16/2021   Hyperlipemia 05/16/2021   Hypertension 05/16/2021   Type II diabetes mellitus ([place]) 05/16/2021   Spinal stenosis of lumbar region 03/10/2019   Cervical myelopathy ([place]) 03/10/2019   Peripheral neuropathy 02/02/2019   Gait abnormality 02/02/2019   Urinary urgency 02/02/2019   Chronic bilateral low back pain without sciatica 02/02/2019   DOE (dyspnea on exertion) 10/05/2017   Bruit of right carotid artery 10/05/2017   Coronary artery calcification seen on CT scan 10/05/2017   Chest pain  10/04/2017   Aortic atherosclerosis ([place]) 10/04/2017   Charcot foot due to diabetes mellitus ([place]) 11/23/2014  Sumner Boast, PT 02/12/2022, 6:49 PM  [place]. [place], [place], [address] Phone: [phone]   Fax:  [phone]  Name: Pamela Snyder MRN: [id number] Date of Birth: Nov 15, 1945

## 2022-02-13 ENCOUNTER — Ambulatory Visit: Payer: Medicare Other | Admitting: Physical Therapy

## 2022-02-13 DIAGNOSIS — R2681 Unsteadiness on feet: Secondary | ICD-10-CM

## 2022-02-13 DIAGNOSIS — M6281 Muscle weakness (generalized): Secondary | ICD-10-CM

## 2022-02-13 DIAGNOSIS — R262 Difficulty in walking, not elsewhere classified: Secondary | ICD-10-CM | POA: Diagnosis not present

## 2022-02-13 NOTE — Therapy (Deleted)
[facility] [place]. [place], [place], [address] Phone: [phone]   Fax:  [phone]  Physical Therapy Treatment  Patient Details  Name: Pamela Snyder MRN: [id number] Date of Birth: 1946/05/06 Referring Provider (PT): Alfredo Martinez   Encounter Date: 02/13/2022   PT End of Session - 02/13/22 1649     Visit Number 29    Date for PT Re-Evaluation 02/23/22    PT Start Time 1700    PT Stop Time 1745    PT Time Calculation (min) 45 min             Past Medical History:  Diagnosis Date   Anxiety    Arthritis    "right foot; spine; hands" (11/23/2014)   Charcot foot due to diabetes mellitus ([place])    Depression    GERD (gastroesophageal reflux disease)    Gout    Hyperlipemia    Hypertension    Migraine    hx   Neuropathy    Numbness    Thyroid goiter 1986   Type II diabetes mellitus ([place])     Past Surgical History:  Procedure Laterality Date   ABDOMINAL HYSTERECTOMY  1999   w/BSO   ACHILLES TENDON LENGTHENING Right 11/23/2014   ACHILLES TENDON LENGTHENING Right 11/23/2014   Procedure: RIGHT ACHILLES PERCUTANEOUS TENDON LENGTHENING;  Surgeon: Wylene Simmer, MD;  Location: [facility];  Service: Orthopedics;  Laterality: Right;   [place] Right 11/23/2014   mid foot   CARDIAC CATHETERIZATION  08/2004   FOOT ARTHRODESIS Right 11/23/2014   Procedure: RIGHT MID FOOT ARTHRODESIS;  Surgeon: Wylene Simmer, MD;  Location: [facility];  Service: Orthopedics;  Laterality: Right;   METATARSAL OSTEOTOMY Right 11/23/2014   Procedure: RIGHT MID FOOT OSTEOTOMY;  Surgeon: Wylene Simmer, MD;  Location: [facility];  Service: Orthopedics;  Laterality: Right;   ORIF ANKLE FRACTURE Left 05/16/2021   Procedure: OPEN REDUCTION INTERNAL FIXATION (ORIF) Left ankle lateral malleolus, possible deltoid ligament repair;  Surgeon: Wylene Simmer, MD;  Location: [facility];  Service: Orthopedics;  Laterality: Left;   OSTEOTOMY Right 11/23/2014   mid  foot   PARTIAL THYMECTOMY  1986   ? side   TONSILLECTOMY  1954    There were no vitals filed for this visit.                                   PT Short Term Goals - 12/30/21 1738       PT SHORT TERM GOAL #1   Title Pt will be I and compliant with HEP    Baseline Patient reports she is performing various exercises she has been given.    Time 3    Period Weeks    Status Achieved               PT Long Term Goals - 02/12/22 1846       PT LONG TERM GOAL #1   Title Pt will improve BERG balance without AD to >18 to show improved balance.    Status On-going      PT LONG TERM GOAL #2   Title Pt will improve gait speed and TUG time to show improved gait and balance below 24 seconds    Status Achieved      PT LONG TERM GOAL #3   Title increase LE strength to 4+/5    Status On-going  PT LONG TERM GOAL #4   Title increase UE strength to 4+/5    Status On-going      PT LONG TERM GOAL #5   Title report able to make a sandwich without sitting    Status Achieved      PT LONG TERM GOAL #6   Title Decrease TUG to <18 seconds.    Status On-going                     Patient will benefit from skilled therapeutic intervention in order to improve the following deficits and impairments:     Visit Diagnosis: Muscle weakness (generalized)  Unsteady gait  Difficulty in walking, not elsewhere classified     Problem List Patient Active Problem List   Diagnosis Date Noted   Closed low lateral malleolus fracture 05/16/2021   Gout 05/16/2021   Hyperlipemia 05/16/2021   Hypertension 05/16/2021   Type II diabetes mellitus ([place]) 05/16/2021   Spinal stenosis of lumbar region 03/10/2019   Cervical myelopathy ([place]) 03/10/2019   Peripheral neuropathy 02/02/2019   Gait abnormality 02/02/2019   Urinary urgency 02/02/2019   Chronic bilateral low back pain without sciatica 02/02/2019   DOE (dyspnea on exertion) 10/05/2017   Bruit of  right carotid artery 10/05/2017   Coronary artery calcification seen on CT scan 10/05/2017   Chest pain 10/04/2017   Aortic atherosclerosis ([place]) 10/04/2017   Charcot foot due to diabetes mellitus ([place]) 11/23/2014    Clarke Amburn,ANGIE, PTA 02/13/2022, 4:50 PM  [facility] [place]. [place], [place], [address] Phone: [phone]   Fax:  [phone]  Name: Williette Loewe MRN: [id number] Date of Birth: Feb 22, 1946  [facility] [place]. [place], [place], [address] Phone: [phone]   Fax:  [phone]  Patient Details  Name: Aqua Denslow MRN: [id number] Date of Birth: Dec 03, 1945 Referring Provider:  [name], PA-C  Encounter Date: 02/13/2022   Laqueta Carina, PTA 02/13/2022, 4:50 PM  [facility]. [place], [place], [address] Phone: [phone]   Fax:  [phone]

## 2022-02-13 NOTE — Therapy (Signed)
[facility]. [place], [place], [address] Phone: [phone]   Fax:  [phone]  Physical Therapy Treatment  Patient Details  Name: Pamela Snyder MRN: [id number] Date of Birth: 14-Mar-1946 Referring Provider (PT): Alfredo Martinez   Encounter Date: 02/13/2022   PT End of Session - 02/13/22 1649     Visit Number 29    Date for PT Re-Evaluation 02/23/22    PT Start Time 1700    PT Stop Time 1745    PT Time Calculation (min) 45 min             Past Medical History:  Diagnosis Date   Anxiety    Arthritis    "right foot; spine; hands" (11/23/2014)   Charcot foot due to diabetes mellitus ([place])    Depression    GERD (gastroesophageal reflux disease)    Gout    Hyperlipemia    Hypertension    Migraine    hx   Neuropathy    Numbness    Thyroid goiter 1986   Type II diabetes mellitus ([place])     Past Surgical History:  Procedure Laterality Date   ABDOMINAL HYSTERECTOMY  1999   w/BSO   ACHILLES TENDON LENGTHENING Right 11/23/2014   ACHILLES TENDON LENGTHENING Right 11/23/2014   Procedure: RIGHT ACHILLES PERCUTANEOUS TENDON LENGTHENING;  Surgeon: Wylene Simmer, MD;  Location: [facility];  Service: Orthopedics;  Laterality: Right;   [place] Right 11/23/2014   mid foot   CARDIAC CATHETERIZATION  08/2004   FOOT ARTHRODESIS Right 11/23/2014   Procedure: RIGHT MID FOOT ARTHRODESIS;  Surgeon: Wylene Simmer, MD;  Location: [facility];  Service: Orthopedics;  Laterality: Right;   METATARSAL OSTEOTOMY Right 11/23/2014   Procedure: RIGHT MID FOOT OSTEOTOMY;  Surgeon: Wylene Simmer, MD;  Location: [facility];  Service: Orthopedics;  Laterality: Right;   ORIF ANKLE FRACTURE Left 05/16/2021   Procedure: OPEN REDUCTION INTERNAL FIXATION (ORIF) Left ankle lateral malleolus, possible deltoid ligament repair;  Surgeon: Wylene Simmer, MD;  Location: [facility];  Service: Orthopedics;  Laterality: Left;   OSTEOTOMY Right 11/23/2014   mid  foot   PARTIAL THYMECTOMY  1986   ? side   TONSILLECTOMY  1954    There were no vitals filed for this visit.   Subjective Assessment - 02/13/22 1651     Subjective doing well    Currently in Pain? No/denies                               [facility] Adult PT Treatment/Exercise - 02/13/22 0001       High Level Balance   High Level Balance Activities Negotiating over obstacles   airex, 6 inch box, 1/2 roll , full roll with RW CG-min A   High Level Balance Comments seated beach ball volley ball   then seated wt ball toss for core     Knee/Hip Exercises: Aerobic   Nustep level 5 x 7 minutes   LE only     Knee/Hip Exercises: Machines for Strengthening   Cybex Knee Extension 10# 3x10    Cybex Knee Flexion 25# 3x10      Knee/Hip Exercises: Standing   Forward Step Up Both;10 reps;Hand Hold: 2;Step Height: 6"   using RW   Other Standing Knee Exercises green tband shdl ext and row 2 set s10 cg-min A    Other Standing Knee Exercises static standing picking  up walker 10 x CGA   worked on turning transfers to sit on rollator 2 each way CG A                      PT Short Term Goals - 12/30/21 1738       PT SHORT TERM GOAL #1   Title Pt will be I and compliant with HEP    Baseline Patient reports she is performing various exercises she has been given.    Time 3    Period Weeks    Status Achieved               PT Long Term Goals - 02/12/22 1846       PT LONG TERM GOAL #1   Title Pt will improve BERG balance without AD to >18 to show improved balance.    Status On-going      PT LONG TERM GOAL #2   Title Pt will improve gait speed and TUG time to show improved gait and balance below 24 seconds    Status Achieved      PT LONG TERM GOAL #3   Title increase LE strength to 4+/5    Status On-going      PT LONG TERM GOAL #4   Title increase UE strength to 4+/5    Status On-going      PT LONG TERM GOAL #5   Title report able to make a sandwich  without sitting    Status Achieved      PT LONG TERM GOAL #6   Title Decrease TUG to <18 seconds.    Status On-going                   Plan - 02/13/22 1745     Clinical Impression Statement worked on standing/walking lifting walker and maintaining balance to aide in func independance. obstacle course with RW neg on and over obj. unsteady but did well with CG- min A and showing some righting reactions but needs assist    PT Treatment/Interventions ADLs/Self Care Home Management;Gait training;Stair training;Functional mobility training;Therapeutic activities;Therapeutic exercise;Balance training;Neuromuscular re-education;Manual techniques;Patient/family education    PT Next Visit Plan continue to push her walking, not sure that the anterior tibialis will get anything, so needs to be functional, safe and independent as she lives alone             Patient will benefit from skilled therapeutic intervention in order to improve the following deficits and impairments:  Abnormal gait, Decreased range of motion, Difficulty walking, Decreased endurance, Pain, Decreased activity tolerance, Decreased balance, Decreased strength, Decreased mobility  Visit Diagnosis: Muscle weakness (generalized)  Unsteady gait  Difficulty in walking, not elsewhere classified     Problem List Patient Active Problem List   Diagnosis Date Noted   Closed low lateral malleolus fracture 05/16/2021   Gout 05/16/2021   Hyperlipemia 05/16/2021   Hypertension 05/16/2021   Type II diabetes mellitus ([place]) 05/16/2021   Spinal stenosis of lumbar region 03/10/2019   Cervical myelopathy ([place]) 03/10/2019   Peripheral neuropathy 02/02/2019   Gait abnormality 02/02/2019   Urinary urgency 02/02/2019   Chronic bilateral low back pain without sciatica 02/02/2019   DOE (dyspnea on exertion) 10/05/2017   Bruit of right carotid artery 10/05/2017   Coronary artery calcification seen on CT scan 10/05/2017   Chest  pain 10/04/2017   Aortic atherosclerosis ([place]) 10/04/2017   Charcot foot due to diabetes mellitus ([place]) 11/23/2014  Andria Head,ANGIE, PTA 02/13/2022, 5:48 PM  [facility] [place]. [place], [place], [address] Phone: [phone]   Fax:  [phone]  Name: Pualani Borah MRN: [id number] Date of Birth: 1945/07/23

## 2022-02-18 ENCOUNTER — Ambulatory Visit: Payer: Medicare Other | Admitting: Physical Therapy

## 2022-02-20 ENCOUNTER — Encounter: Payer: Self-pay | Admitting: Physical Therapy

## 2022-02-20 ENCOUNTER — Ambulatory Visit: Payer: Medicare Other | Admitting: Physical Therapy

## 2022-02-20 DIAGNOSIS — R262 Difficulty in walking, not elsewhere classified: Secondary | ICD-10-CM

## 2022-02-20 DIAGNOSIS — R2681 Unsteadiness on feet: Secondary | ICD-10-CM

## 2022-02-20 DIAGNOSIS — M6281 Muscle weakness (generalized): Secondary | ICD-10-CM

## 2022-02-20 NOTE — Therapy (Signed)
[facility]. [place], [place], [address] Phone: [phone]   Fax:  [phone] Progress Note Reporting Period 01/16/22 to 02/20/22 for visits 21-30  See note below for Objective Data and Assessment of Progress/Goals.     Physical Therapy Treatment  Patient Details  Name: Pamela Snyder MRN: [id number] Date of Birth: 1945-10-29 Referring Provider (PT): Alfredo Martinez   Encounter Date: 02/20/2022   PT End of Session - 02/20/22 1749     Visit Number 30    Date for PT Re-Evaluation 02/23/22    PT Start Time 1718    PT Stop Time 1800    PT Time Calculation (min) 42 min    Equipment Utilized During Treatment Gait belt    Activity Tolerance Patient tolerated treatment well    Behavior During Therapy WFL for tasks assessed/performed             Past Medical History:  Diagnosis Date   Anxiety    Arthritis    "right foot; spine; hands" (11/23/2014)   Charcot foot due to diabetes mellitus ([place])    Depression    GERD (gastroesophageal reflux disease)    Gout    Hyperlipemia    Hypertension    Migraine    hx   Neuropathy    Numbness    Thyroid goiter 1986   Type II diabetes mellitus ([place])     Past Surgical History:  Procedure Laterality Date   ABDOMINAL HYSTERECTOMY  1999   w/BSO   ACHILLES TENDON LENGTHENING Right 11/23/2014   ACHILLES TENDON LENGTHENING Right 11/23/2014   Procedure: RIGHT ACHILLES PERCUTANEOUS TENDON LENGTHENING;  Surgeon: Wylene Simmer, MD;  Location: [facility];  Service: Orthopedics;  Laterality: Right;   [place] Right 11/23/2014   mid foot   CARDIAC CATHETERIZATION  08/2004   FOOT ARTHRODESIS Right 11/23/2014   Procedure: RIGHT MID FOOT ARTHRODESIS;  Surgeon: Wylene Simmer, MD;  Location: [facility];  Service: Orthopedics;  Laterality: Right;   METATARSAL OSTEOTOMY Right 11/23/2014   Procedure: RIGHT MID FOOT OSTEOTOMY;  Surgeon: Wylene Simmer, MD;  Location: [facility];  Service:  Orthopedics;  Laterality: Right;   ORIF ANKLE FRACTURE Left 05/16/2021   Procedure: OPEN REDUCTION INTERNAL FIXATION (ORIF) Left ankle lateral malleolus, possible deltoid ligament repair;  Surgeon: Wylene Simmer, MD;  Location: [facility];  Service: Orthopedics;  Laterality: Left;   OSTEOTOMY Right 11/23/2014   mid foot   PARTIAL THYMECTOMY  1986   ? side   TONSILLECTOMY  1954    There were no vitals filed for this visit.   Subjective Assessment - 02/20/22 1749     Subjective I have not been able to go and do much, had to cancel Tuesday due to weather, feel lilke I am not doing as well    Currently in Pain? No/denies                               [facility] Adult PT Treatment/Exercise - 02/20/22 0001       Ambulation/Gait   Gait Comments ambulation outside 3 curbs, needed more assist today to get walker up and down and some LOB, then a short rest and walked around to the front of the building.      High Level Balance   High Level Balance Comments seated beach ball volley ball      Knee/Hip Exercises: Machines for Strengthening  Cybex Knee Extension 5# 3x10    Cybex Knee Flexion 25# 3x10      Knee/Hip Exercises: Seated   Other Seated Knee/Hip Exercises pball in lap isometrics                       PT Short Term Goals - 12/30/21 1738       PT SHORT TERM GOAL #1   Title Pt will be I and compliant with HEP    Baseline Patient reports she is performing various exercises she has been given.    Time 3    Period Weeks    Status Achieved               PT Long Term Goals - 02/20/22 1750       PT LONG TERM GOAL #1   Title Pt will improve BERG balance without AD to >18 to show improved balance.    Status On-going      PT LONG TERM GOAL #2   Title Pt will improve gait speed and TUG time to show improved gait and balance below 24 seconds    Status Achieved                   Plan - 02/20/22 1841     Clinical Impression Statement She  was not here since last week due to weather, she reports that she feels like she regressed a little, she wanted to walk so we did a big walk outside, she had more difficulty today with the negotiating of the curbs, required Min A to do this, on the level surfaces she did well.    PT Next Visit Plan will continue to assess her progress as we are over the cap however she is still improving    Consulted and Agree with Plan of Care Patient             Patient will benefit from skilled therapeutic intervention in order to improve the following deficits and impairments:  Abnormal gait, Decreased range of motion, Difficulty walking, Decreased endurance, Pain, Decreased activity tolerance, Decreased balance, Decreased strength, Decreased mobility  Visit Diagnosis: Muscle weakness (generalized)  Unsteady gait  Difficulty in walking, not elsewhere classified     Problem List Patient Active Problem List   Diagnosis Date Noted   Closed low lateral malleolus fracture 05/16/2021   Gout 05/16/2021   Hyperlipemia 05/16/2021   Hypertension 05/16/2021   Type II diabetes mellitus ([place]) 05/16/2021   Spinal stenosis of lumbar region 03/10/2019   Cervical myelopathy ([place]) 03/10/2019   Peripheral neuropathy 02/02/2019   Gait abnormality 02/02/2019   Urinary urgency 02/02/2019   Chronic bilateral low back pain without sciatica 02/02/2019   DOE (dyspnea on exertion) 10/05/2017   Bruit of right carotid artery 10/05/2017   Coronary artery calcification seen on CT scan 10/05/2017   Chest pain 10/04/2017   Aortic atherosclerosis ([place]) 10/04/2017   Charcot foot due to diabetes mellitus ([place]) 11/23/2014    Sumner Boast, PT 02/20/2022, 6:43 PM  [place]. [place], [place], [address] Phone: [phone]   Fax:  [phone]  Name: Aniesha Haughn MRN: [id number] Date of Birth: 03/01/1946

## 2022-02-25 ENCOUNTER — Ambulatory Visit: Payer: Medicare Other | Admitting: Physical Therapy

## 2022-02-25 ENCOUNTER — Encounter: Payer: Self-pay | Admitting: Physical Therapy

## 2022-02-25 DIAGNOSIS — R262 Difficulty in walking, not elsewhere classified: Secondary | ICD-10-CM | POA: Diagnosis not present

## 2022-02-25 DIAGNOSIS — M6281 Muscle weakness (generalized): Secondary | ICD-10-CM | POA: Diagnosis not present

## 2022-02-25 DIAGNOSIS — R2681 Unsteadiness on feet: Secondary | ICD-10-CM | POA: Diagnosis not present

## 2022-02-25 NOTE — Therapy (Signed)
[facility]. [place], [place], [address] Phone: [phone]   Fax:  [phone]  Physical Therapy Treatment  Patient Details  Name: Pamela Snyder MRN: [id number] Date of Birth: 06-22-1946 Referring Provider (PT): Alfredo Martinez   Encounter Date: 02/25/2022   PT End of Session - 02/25/22 1716     Visit Number 31    Date for PT Re-Evaluation 03/28/22    PT Start Time 1713    PT Stop Time 1800    PT Time Calculation (min) 47 min    Equipment Utilized During Treatment Gait belt    Activity Tolerance Patient tolerated treatment well    Behavior During Therapy [facility] for tasks assessed/performed             Past Medical History:  Diagnosis Date   Anxiety    Arthritis    "right foot; spine; hands" (11/23/2014)   Charcot foot due to diabetes mellitus ([place])    Depression    GERD (gastroesophageal reflux disease)    Gout    Hyperlipemia    Hypertension    Migraine    hx   Neuropathy    Numbness    Thyroid goiter 1986   Type II diabetes mellitus ([place])     Past Surgical History:  Procedure Laterality Date   ABDOMINAL HYSTERECTOMY  1999   w/BSO   ACHILLES TENDON LENGTHENING Right 11/23/2014   ACHILLES TENDON LENGTHENING Right 11/23/2014   Procedure: RIGHT ACHILLES PERCUTANEOUS TENDON LENGTHENING;  Surgeon: Wylene Simmer, MD;  Location: [facility];  Service: Orthopedics;  Laterality: Right;   [place] Right 11/23/2014   mid foot   CARDIAC CATHETERIZATION  08/2004   FOOT ARTHRODESIS Right 11/23/2014   Procedure: RIGHT MID FOOT ARTHRODESIS;  Surgeon: Wylene Simmer, MD;  Location: [facility];  Service: Orthopedics;  Laterality: Right;   METATARSAL OSTEOTOMY Right 11/23/2014   Procedure: RIGHT MID FOOT OSTEOTOMY;  Surgeon: Wylene Simmer, MD;  Location: [facility];  Service: Orthopedics;  Laterality: Right;   ORIF ANKLE FRACTURE Left 05/16/2021   Procedure: OPEN REDUCTION INTERNAL FIXATION (ORIF) Left ankle lateral  malleolus, possible deltoid ligament repair;  Surgeon: Wylene Simmer, MD;  Location: [facility];  Service: Orthopedics;  Laterality: Left;   OSTEOTOMY Right 11/23/2014   mid foot   PARTIAL THYMECTOMY  1986   ? side   TONSILLECTOMY  1954    There were no vitals filed for this visit.   Subjective Assessment - 02/25/22 1716     Subjective Tired, just woke up from a nap, no falls                               [facility] Adult PT Treatment/Exercise - 02/25/22 0001       Transfers   Comments worked on the rollator transfers      Ambulation/Gait   Gait Comments 350' in side with FWW CGA, practiced some lifting walker up and over small objexts for balance and function      High Level Balance   High Level Balance Comments seated beach ball volley ball      Knee/Hip Exercises: Aerobic   Nustep level 5 x 7 minutes      Knee/Hip Exercises: Machines for Strengthening   Cybex Knee Extension 10# 3x10    Cybex Knee Flexion 25# 3x10    Cybex Leg Press 40# 3x10  PT Short Term Goals - 12/30/21 1738       PT SHORT TERM GOAL #1   Title Pt will be I and compliant with HEP    Baseline Patient reports she is performing various exercises she has been given.    Time 3    Period Weeks    Status Achieved               PT Long Term Goals - 02/25/22 1717       PT LONG TERM GOAL #1   Title Pt will improve BERG balance without AD to >18 to show improved balance.    Status On-going      PT LONG TERM GOAL #2   Title Pt will improve gait speed and TUG time to show improved gait and balance below 24 seconds    Status Achieved      PT LONG TERM GOAL #3   Title increase LE strength to 4+/5    Status On-going      PT LONG TERM GOAL #4   Title increase UE strength to 4+/5    Status Partially Met      PT LONG TERM GOAL #5   Title report able to make a sandwich without sitting    Status Achieved      PT LONG TERM GOAL #6   Title Decrease  TUG to <18 seconds.    Status Partially Met                   Plan - 02/25/22 1807     Clinical Impression Statement WE continue to work with Pamela Snyder on functional activities to help her be safe and stay independent, she lives alone and needs to be safe, she currently has daughters that are splitting time with her.  She has severe neuropathy in the LE's and has poor balance.  we are trying to challenger her balance and build her strength to help with this, she really has difficulty negotiating over thresholds as she is unsafe, she also has difficulty with transfering from sitting on her rollator and turning around to use it.    PT Frequency 2x / week    PT Duration 4 weeks    PT Treatment/Interventions ADLs/Self Care Home Management;Gait training;Stair training;Functional mobility training;Therapeutic activities;Therapeutic exercise;Balance training;Neuromuscular re-education;Manual techniques;Patient/family education    PT Next Visit Plan try to maximize her function and safety    Consulted and Agree with Plan of Care Patient             Patient will benefit from skilled therapeutic intervention in order to improve the following deficits and impairments:  Abnormal gait, Decreased range of motion, Difficulty walking, Decreased endurance, Pain, Decreased activity tolerance, Decreased balance, Decreased strength, Decreased mobility  Visit Diagnosis: Muscle weakness (generalized)  Unsteady gait  Difficulty in walking, not elsewhere classified     Problem List Patient Active Problem List   Diagnosis Date Noted   Closed low lateral malleolus fracture 05/16/2021   Gout 05/16/2021   Hyperlipemia 05/16/2021   Hypertension 05/16/2021   Type II diabetes mellitus ([place]) 05/16/2021   Spinal stenosis of lumbar region 03/10/2019   Cervical myelopathy ([place]) 03/10/2019   Peripheral neuropathy 02/02/2019   Gait abnormality 02/02/2019   Urinary urgency 02/02/2019   Chronic bilateral  low back pain without sciatica 02/02/2019   DOE (dyspnea on exertion) 10/05/2017   Bruit of right carotid artery 10/05/2017   Coronary artery calcification seen on CT scan 10/05/2017   Chest  pain 10/04/2017   Aortic atherosclerosis ([place]) 10/04/2017   Charcot foot due to diabetes mellitus ([place]) 11/23/2014    Sumner Boast, PT 02/25/2022, 6:19 PM  [place]. [place], [place], [address] Phone: [phone]   Fax:  [phone]  Name: Meggen Spaziani MRN: [id number] Date of Birth: 1946/05/09

## 2022-02-27 ENCOUNTER — Ambulatory Visit: Payer: Medicare Other | Admitting: Physical Therapy

## 2022-03-04 ENCOUNTER — Encounter: Payer: Self-pay | Admitting: Physical Therapy

## 2022-03-04 ENCOUNTER — Ambulatory Visit: Payer: Medicare Other | Admitting: Physical Therapy

## 2022-03-04 DIAGNOSIS — M6281 Muscle weakness (generalized): Secondary | ICD-10-CM | POA: Diagnosis not present

## 2022-03-04 DIAGNOSIS — R262 Difficulty in walking, not elsewhere classified: Secondary | ICD-10-CM

## 2022-03-04 DIAGNOSIS — R2681 Unsteadiness on feet: Secondary | ICD-10-CM

## 2022-03-04 NOTE — Therapy (Signed)
[facility] [place]. [place], [place], [address] Phone: [phone]   Fax:  [phone]  Physical Therapy Treatment  Patient Details  Name: Pamela Snyder MRN: [id number] Date of Birth: [birth year] Referring Provider (PT): Alfredo Martinez   Encounter Date: 03/04/2022   PT End of Session - 03/04/22 1720     Visit Number 32    Date for PT Re-Evaluation 03/28/22    PT Start Time 1714    PT Stop Time 1800    PT Time Calculation (min) 46 min    Equipment Utilized During Treatment Gait belt    Activity Tolerance Patient tolerated treatment well    Behavior During Therapy [facility] for tasks assessed/performed             Past Medical History:  Diagnosis Date   Anxiety    Arthritis    "right foot; spine; hands" (11/23/2014)   Charcot foot due to diabetes mellitus ([place])    Depression    GERD (gastroesophageal reflux disease)    Gout    Hyperlipemia    Hypertension    Migraine    hx   Neuropathy    Numbness    Thyroid goiter 1986   Type II diabetes mellitus ([place])     Past Surgical History:  Procedure Laterality Date   ABDOMINAL HYSTERECTOMY  1999   w/BSO   ACHILLES TENDON LENGTHENING Right 11/23/2014   ACHILLES TENDON LENGTHENING Right 11/23/2014   Procedure: RIGHT ACHILLES PERCUTANEOUS TENDON LENGTHENING;  Surgeon: Wylene Simmer, MD;  Location: [place];  Service: Orthopedics;  Laterality: Right;   [place] Right 11/23/2014   mid foot   CARDIAC CATHETERIZATION  08/2004   FOOT ARTHRODESIS Right 11/23/2014   Procedure: RIGHT MID FOOT ARTHRODESIS;  Surgeon: Wylene Simmer, MD;  Location: [place];  Service: Orthopedics;  Laterality: Right;   METATARSAL OSTEOTOMY Right 11/23/2014   Procedure: RIGHT MID FOOT OSTEOTOMY;  Surgeon: Wylene Simmer, MD;  Location: [place];  Service: Orthopedics;  Laterality: Right;   ORIF ANKLE FRACTURE Left 05/16/2021   Procedure: OPEN REDUCTION INTERNAL FIXATION (ORIF) Left ankle lateral  malleolus, possible deltoid ligament repair;  Surgeon: Wylene Simmer, MD;  Location: [place];  Service: Orthopedics;  Laterality: Left;   OSTEOTOMY Right 11/23/2014   mid foot   PARTIAL THYMECTOMY  1986   ? side   TONSILLECTOMY  1954    There were no vitals filed for this visit.   Subjective Assessment - 03/04/22 1721     Subjective very tired did not sleep well.  I did not have much help around last week so I missed one appointment and did not walk much    Currently in Pain? No/denies                               [facility] Adult PT Treatment/Exercise - 03/04/22 0001       Ambulation/Gait   Gait Comments gait outside negotiated 4 curbs CGA      High Level Balance   High Level Balance Comments seated beach ball volley ball      Knee/Hip Exercises: Aerobic   Nustep level 5 x 7 minutes      Knee/Hip Exercises: Machines for Strengthening   Cybex Knee Extension 10# 3x10    Cybex Knee Flexion 25# 3x10    Cybex Leg Press 40# 3x10      Knee/Hip Exercises: Seated  Other Seated Knee/Hip Exercises pball in lap isometrics                       PT Short Term Goals - 12/30/21 1738       PT SHORT TERM GOAL #1   Title Pt will be I and compliant with HEP    Baseline Patient reports she is performing various exercises she has been given.    Time 3    Period Weeks    Status Achieved               PT Long Term Goals - 02/25/22 1717       PT LONG TERM GOAL #1   Title Pt will improve BERG balance without AD to >18 to show improved balance.    Status On-going      PT LONG TERM GOAL #2   Title Pt will improve gait speed and TUG time to show improved gait and balance below 24 seconds    Status Achieved      PT LONG TERM GOAL #3   Title increase LE strength to 4+/5    Status On-going      PT LONG TERM GOAL #4   Title increase UE strength to 4+/5    Status Partially Met      PT LONG TERM GOAL #5   Title report able to make a sandwich without  sitting    Status Achieved      PT LONG TERM GOAL #6   Title Decrease TUG to <18 seconds.    Status Partially Met                   Plan - 03/04/22 1842     Clinical Impression Statement Patient was only able to come in one time last week.  She did well today with her walking and seemed to be much safer with negotiating curbs, still needs CGA but seems to be safer.    PT Next Visit Plan try to maximize her function and safety    Consulted and Agree with Plan of Care Patient             Patient will benefit from skilled therapeutic intervention in order to improve the following deficits and impairments:  Abnormal gait, Decreased range of motion, Difficulty walking, Decreased endurance, Pain, Decreased activity tolerance, Decreased balance, Decreased strength, Decreased mobility  Visit Diagnosis: Muscle weakness (generalized)  Unsteady gait  Difficulty in walking, not elsewhere classified     Problem List Patient Active Problem List   Diagnosis Date Noted   Closed low lateral malleolus fracture 05/16/2021   Gout 05/16/2021   Hyperlipemia 05/16/2021   Hypertension 05/16/2021   Type II diabetes mellitus ([place]) 05/16/2021   Spinal stenosis of lumbar region 03/10/2019   Cervical myelopathy ([place]) 03/10/2019   Peripheral neuropathy 02/02/2019   Gait abnormality 02/02/2019   Urinary urgency 02/02/2019   Chronic bilateral low back pain without sciatica 02/02/2019   DOE (dyspnea on exertion) 10/05/2017   Bruit of right carotid artery 10/05/2017   Coronary artery calcification seen on CT scan 10/05/2017   Chest pain 10/04/2017   Aortic atherosclerosis ([place]) 10/04/2017   Charcot foot due to diabetes mellitus ([place]) 11/23/2014    Sumner Boast, PT 03/04/2022, 6:43 PM  [place]. [place], [place], [address] Phone: [phone]   Fax:  [phone]  Name: Lakoda Raske MRN: [id number] Date  of  Birth: 22-Mar-1946

## 2022-03-06 ENCOUNTER — Other Ambulatory Visit: Payer: Self-pay | Admitting: Family Medicine

## 2022-03-06 ENCOUNTER — Ambulatory Visit: Payer: Medicare Other

## 2022-03-06 DIAGNOSIS — M6281 Muscle weakness (generalized): Secondary | ICD-10-CM

## 2022-03-06 DIAGNOSIS — Z6834 Body mass index (BMI) 34.0-34.9, adult: Secondary | ICD-10-CM | POA: Diagnosis not present

## 2022-03-06 DIAGNOSIS — R2681 Unsteadiness on feet: Secondary | ICD-10-CM | POA: Diagnosis not present

## 2022-03-06 DIAGNOSIS — E1143 Type 2 diabetes mellitus with diabetic autonomic (poly)neuropathy: Secondary | ICD-10-CM | POA: Diagnosis not present

## 2022-03-06 DIAGNOSIS — E7800 Pure hypercholesterolemia, unspecified: Secondary | ICD-10-CM | POA: Diagnosis not present

## 2022-03-06 DIAGNOSIS — Z87891 Personal history of nicotine dependence: Secondary | ICD-10-CM | POA: Diagnosis not present

## 2022-03-06 DIAGNOSIS — I2510 Atherosclerotic heart disease of native coronary artery without angina pectoris: Secondary | ICD-10-CM | POA: Diagnosis not present

## 2022-03-06 DIAGNOSIS — R262 Difficulty in walking, not elsewhere classified: Secondary | ICD-10-CM | POA: Diagnosis not present

## 2022-03-06 DIAGNOSIS — Z23 Encounter for immunization: Secondary | ICD-10-CM | POA: Diagnosis not present

## 2022-03-06 DIAGNOSIS — I10 Essential (primary) hypertension: Secondary | ICD-10-CM | POA: Diagnosis not present

## 2022-03-06 DIAGNOSIS — N183 Chronic kidney disease, stage 3 unspecified: Secondary | ICD-10-CM | POA: Diagnosis not present

## 2022-03-06 DIAGNOSIS — K219 Gastro-esophageal reflux disease without esophagitis: Secondary | ICD-10-CM | POA: Diagnosis not present

## 2022-03-06 NOTE — Therapy (Signed)
[place]. [place], [place], [address] Phone: [phone]   Fax:  [phone]  Physical Therapy Treatment  Patient Details  Name: Pamela Snyder MRN: [id number] Date of Birth: Jan 02, 1946 Referring Provider (PT): Alfredo Martinez   Encounter Date: 03/06/2022   PT End of Session - 03/06/22 1716     Visit Number 45    Date for PT Re-Evaluation 03/28/22    PT Start Time 1715    Equipment Utilized During Treatment Gait belt    Activity Tolerance Patient tolerated treatment well    Behavior During Therapy [facility] for tasks assessed/performed              Past Medical History:  Diagnosis Date   Anxiety    Arthritis    "right foot; spine; hands" (11/23/2014)   Charcot foot due to diabetes mellitus ([place])    Depression    GERD (gastroesophageal reflux disease)    Gout    Hyperlipemia    Hypertension    Migraine    hx   Neuropathy    Numbness    Thyroid goiter 1986   Type II diabetes mellitus ([place])     Past Surgical History:  Procedure Laterality Date   ABDOMINAL HYSTERECTOMY  1999   w/BSO   ACHILLES TENDON LENGTHENING Right 11/23/2014   ACHILLES TENDON LENGTHENING Right 11/23/2014   Procedure: RIGHT ACHILLES PERCUTANEOUS TENDON LENGTHENING;  Surgeon: Wylene Simmer, MD;  Location: [place];  Service: Orthopedics;  Laterality: Right;   [place] Right 11/23/2014   mid foot   CARDIAC CATHETERIZATION  08/2004   FOOT ARTHRODESIS Right 11/23/2014   Procedure: RIGHT MID FOOT ARTHRODESIS;  Surgeon: Wylene Simmer, MD;  Location: [place];  Service: Orthopedics;  Laterality: Right;   METATARSAL OSTEOTOMY Right 11/23/2014   Procedure: RIGHT MID FOOT OSTEOTOMY;  Surgeon: Wylene Simmer, MD;  Location: [place];  Service: Orthopedics;  Laterality: Right;   ORIF ANKLE FRACTURE Left 05/16/2021   Procedure: OPEN REDUCTION INTERNAL FIXATION (ORIF) Left ankle lateral malleolus, possible deltoid ligament repair;  Surgeon:  Wylene Simmer, MD;  Location: [place];  Service: Orthopedics;  Laterality: Left;   OSTEOTOMY Right 11/23/2014   mid foot   PARTIAL THYMECTOMY  1986   ? side   TONSILLECTOMY  1954    There were no vitals filed for this visit.   Subjective Assessment - 03/06/22 1804     Subjective Did some walking today because I had a doctor's appointment but I am tired and feeling slow today.    Pertinent History reports HNP in the neck and the low back, reports sciatica and some numbness in the hands.  She had right foot surgery 2016 Charcot foot    How long can you stand comfortably? 3-5 minutes    How long can you walk comfortably? 50-70 feet    Patient Stated Goals walk normally with a rollator, feel stronger and have more indepdnence    Pain Onset More than a month ago              Treatment 03/06/22 Walking outside small loop and up and down curbs  Nustep L5 x67mns  Step up on airex in walker x10 each side Partial sit ups from wedge holding yellow ball 2x10 Twists with blackTB Hip flexor in power tower holding on to walker 25# 2x10  PT Short Term Goals - 12/30/21 1738       PT SHORT TERM GOAL #1   Title Pt will be I and compliant with HEP    Baseline Patient reports she is performing various exercises she has been given.    Time 3    Period Weeks    Status Achieved               PT Long Term Goals - 02/25/22 1717       PT LONG TERM GOAL #1   Title Pt will improve BERG balance without AD to >18 to show improved balance.    Status On-going      PT LONG TERM GOAL #2   Title Pt will improve gait speed and TUG time to show improved gait and balance below 24 seconds    Status Achieved      PT LONG TERM GOAL #3   Title increase LE strength to 4+/5    Status On-going      PT LONG TERM GOAL #4   Title increase UE strength to 4+/5    Status Partially Met      PT LONG TERM GOAL #5   Title report able to make a  sandwich without sitting    Status Achieved      PT LONG TERM GOAL #6   Title Decrease TUG to <18 seconds.    Status Partially Met                   Plan - 03/06/22 1805     Clinical Impression Statement Nollie returns feeling tired and was slower on the walk today, but able to navigate curbs without LOB and more stability. Tried standing without holding on to anything, she is very unstable and nervous. So we did exercises in which she could hold on for support. Does well with step ups on airex and with standing exercise pushing down on weight. Will benefit from continued strengthening, and gait and balance activities.    PT Next Visit Plan try to maximize her function and safety    Consulted and Agree with Plan of Care Patient              Patient will benefit from skilled therapeutic intervention in order to improve the following deficits and impairments:  Abnormal gait, Decreased range of motion, Difficulty walking, Decreased endurance, Pain, Decreased activity tolerance, Decreased balance, Decreased strength, Decreased mobility  Visit Diagnosis: Muscle weakness (generalized)  Unsteady gait  Difficulty in walking, not elsewhere classified     Problem List Patient Active Problem List   Diagnosis Date Noted   Closed low lateral malleolus fracture 05/16/2021   Gout 05/16/2021   Hyperlipemia 05/16/2021   Hypertension 05/16/2021   Type II diabetes mellitus ([place]) 05/16/2021   Spinal stenosis of lumbar region 03/10/2019   Cervical myelopathy ([place]) 03/10/2019   Peripheral neuropathy 02/02/2019   Gait abnormality 02/02/2019   Urinary urgency 02/02/2019   Chronic bilateral low back pain without sciatica 02/02/2019   DOE (dyspnea on exertion) 10/05/2017   Bruit of right carotid artery 10/05/2017   Coronary artery calcification seen on CT scan 10/05/2017   Chest pain 10/04/2017   Aortic atherosclerosis ([place]) 10/04/2017   Charcot foot due to diabetes mellitus ([place])  11/23/2014    Andris Baumann, PT 03/06/2022, 6:05 PM  [place]. [place], [place], [address] Phone: [phone]   Fax:  [phone]  Name:  [place] Doughman Ditullio MRN: [id number] Date of Birth: [birth year]

## 2022-03-13 ENCOUNTER — Encounter: Payer: Self-pay | Admitting: Physical Therapy

## 2022-03-13 ENCOUNTER — Ambulatory Visit: Payer: Medicare Other | Attending: Student | Admitting: Physical Therapy

## 2022-03-13 DIAGNOSIS — R2681 Unsteadiness on feet: Secondary | ICD-10-CM | POA: Diagnosis not present

## 2022-03-13 DIAGNOSIS — M6281 Muscle weakness (generalized): Secondary | ICD-10-CM | POA: Diagnosis not present

## 2022-03-13 DIAGNOSIS — R262 Difficulty in walking, not elsewhere classified: Secondary | ICD-10-CM | POA: Diagnosis not present

## 2022-03-13 NOTE — Therapy (Signed)
[place]. [place], [place], [address] Phone: [phone]   Fax:  [phone]  Physical Therapy Treatment  Patient Details  Name: Pamela Snyder MRN: [id number] Date of Birth: 1946-04-23 Referring Provider (PT): Alfredo Martinez   Encounter Date: 03/13/2022   PT End of Session - 03/13/22 1724     Visit Number 43    Date for PT Re-Evaluation 03/28/22    PT Start Time 7253    PT Stop Time 1800    PT Time Calculation (min) 45 min    Equipment Utilized During Treatment Gait belt    Activity Tolerance Patient tolerated treatment well    Behavior During Therapy [facility] for tasks assessed/performed             Past Medical History:  Diagnosis Date   Anxiety    Arthritis    "right foot; spine; hands" (11/23/2014)   Charcot foot due to diabetes mellitus ([place])    Depression    GERD (gastroesophageal reflux disease)    Gout    Hyperlipemia    Hypertension    Migraine    hx   Neuropathy    Numbness    Thyroid goiter 1986   Type II diabetes mellitus ([place])     Past Surgical History:  Procedure Laterality Date   ABDOMINAL HYSTERECTOMY  1999   w/BSO   ACHILLES TENDON LENGTHENING Right 11/23/2014   ACHILLES TENDON LENGTHENING Right 11/23/2014   Procedure: RIGHT ACHILLES PERCUTANEOUS TENDON LENGTHENING;  Surgeon: Wylene Simmer, MD;  Location: [place];  Service: Orthopedics;  Laterality: Right;   [place] Right 11/23/2014   mid foot   CARDIAC CATHETERIZATION  08/2004   FOOT ARTHRODESIS Right 11/23/2014   Procedure: RIGHT MID FOOT ARTHRODESIS;  Surgeon: Wylene Simmer, MD;  Location: [place];  Service: Orthopedics;  Laterality: Right;   METATARSAL OSTEOTOMY Right 11/23/2014   Procedure: RIGHT MID FOOT OSTEOTOMY;  Surgeon: Wylene Simmer, MD;  Location: [place];  Service: Orthopedics;  Laterality: Right;   ORIF ANKLE FRACTURE Left 05/16/2021   Procedure: OPEN REDUCTION INTERNAL FIXATION (ORIF) Left ankle lateral  malleolus, possible deltoid ligament repair;  Surgeon: Wylene Simmer, MD;  Location: [place];  Service: Orthopedics;  Laterality: Left;   OSTEOTOMY Right 11/23/2014   mid foot   PARTIAL THYMECTOMY  1986   ? side   TONSILLECTOMY  1954    There were no vitals filed for this visit.   Subjective Assessment - 03/13/22 1727     Subjective I hurt my right shoulder on Monday, I was pushing to get up and felt a pop and now I have issueswith reaching and lifting anything                               [facility] Adult PT Treatment/Exercise - 03/13/22 0001       Ambulation/Gait   Gait Comments gait outside out back door, negotiated one curb, needed some help with this today due to shoulder pain, then around to the front door, SBA      Knee/Hip Exercises: Aerobic   Nustep level 5 x 7 minutes      Knee/Hip Exercises: Machines for Strengthening   Cybex Knee Extension 10# 3x10    Cybex Knee Flexion 25# 3x10      Knee/Hip Exercises: Seated   Knee/Hip Flexion red tband rows and extension  PT Short Term Goals - 12/30/21 1738       PT SHORT TERM GOAL #1   Title Pt will be I and compliant with HEP    Baseline Patient reports she is performing various exercises she has been given.    Time 3    Period Weeks    Status Achieved               PT Long Term Goals - 03/13/22 1850       PT LONG TERM GOAL #1   Title Pt will improve BERG balance without AD to >18 to show improved balance.    Status On-going      PT LONG TERM GOAL #2   Title Pt will improve gait speed and TUG time to show improved gait and balance below 24 seconds    Status Achieved      PT LONG TERM GOAL #3   Title increase LE strength to 4+/5    Status On-going      PT LONG TERM GOAL #4   Title increase UE strength to 4+/5    Status Partially Met      PT LONG TERM GOAL #5   Title report able to make a sandwich without sitting    Status Achieved                    Plan - 03/13/22 1851     Clinical Impression Statement Patient reports that she injured her shoulder getting up, reports some pain with reaching and lifting, Palpation was mildy tender, no deformity or popping iwth motions, I did add a little shoulder stability exercises, she did need some help with the walker liftin gup and down the curb today due to this.    PT Next Visit Plan try to maximize her function and safety    Consulted and Agree with Plan of Care Patient             Patient will benefit from skilled therapeutic intervention in order to improve the following deficits and impairments:  Abnormal gait, Decreased range of motion, Difficulty walking, Decreased endurance, Pain, Decreased activity tolerance, Decreased balance, Decreased strength, Decreased mobility  Visit Diagnosis: Muscle weakness (generalized)  Unsteady gait  Difficulty in walking, not elsewhere classified     Problem List Patient Active Problem List   Diagnosis Date Noted   Closed low lateral malleolus fracture 05/16/2021   Gout 05/16/2021   Hyperlipemia 05/16/2021   Hypertension 05/16/2021   Type II diabetes mellitus ([place]) 05/16/2021   Spinal stenosis of lumbar region 03/10/2019   Cervical myelopathy ([place]) 03/10/2019   Peripheral neuropathy 02/02/2019   Gait abnormality 02/02/2019   Urinary urgency 02/02/2019   Chronic bilateral low back pain without sciatica 02/02/2019   DOE (dyspnea on exertion) 10/05/2017   Bruit of right carotid artery 10/05/2017   Coronary artery calcification seen on CT scan 10/05/2017   Chest pain 10/04/2017   Aortic atherosclerosis ([place]) 10/04/2017   Charcot foot due to diabetes mellitus ([place]) 11/23/2014    Sumner Boast, PT 03/13/2022, 6:54 PM  [facility]. [place], [place], [address] Phone: [phone]   Fax:  [phone]  Name: Pamela Snyder MRN: [id number] Date of Birth:  [birth year]

## 2022-03-17 ENCOUNTER — Encounter: Payer: Medicare Other | Admitting: Physical Therapy

## 2022-03-24 ENCOUNTER — Ambulatory Visit: Payer: Medicare Other | Admitting: Physical Therapy

## 2022-03-27 ENCOUNTER — Ambulatory Visit: Payer: Medicare Other

## 2022-03-28 ENCOUNTER — Ambulatory Visit: Payer: Medicare Other | Admitting: Physical Therapy

## 2022-03-28 DIAGNOSIS — R2681 Unsteadiness on feet: Secondary | ICD-10-CM | POA: Diagnosis not present

## 2022-03-28 DIAGNOSIS — M6281 Muscle weakness (generalized): Secondary | ICD-10-CM | POA: Diagnosis not present

## 2022-03-28 DIAGNOSIS — R262 Difficulty in walking, not elsewhere classified: Secondary | ICD-10-CM

## 2022-03-28 NOTE — Therapy (Signed)
[facility]. [place], [place], [address] Phone: [phone]   Fax:  [phone]  Physical Therapy Treatment  Patient Details  Name: Pamela Snyder MRN: [id number] Date of Birth: [birth year] Referring Provider (PT): Alfredo Martinez   Encounter Date: 03/28/2022   PT End of Session - 03/28/22 1042     Visit Number 35    Date for PT Re-Evaluation 03/28/22    PT Start Time 1015    PT Stop Time 1100    PT Time Calculation (min) 45 min             Past Medical History:  Diagnosis Date   Anxiety    Arthritis    "right foot; spine; hands" (11/23/2014)   Charcot foot due to diabetes mellitus ([place])    Depression    GERD (gastroesophageal reflux disease)    Gout    Hyperlipemia    Hypertension    Migraine    hx   Neuropathy    Numbness    Thyroid goiter 1986   Type II diabetes mellitus ([place])     Past Surgical History:  Procedure Laterality Date   ABDOMINAL HYSTERECTOMY  1999   w/BSO   ACHILLES TENDON LENGTHENING Right 11/23/2014   ACHILLES TENDON LENGTHENING Right 11/23/2014   Procedure: RIGHT ACHILLES PERCUTANEOUS TENDON LENGTHENING;  Surgeon: Wylene Simmer, MD;  Location: [place];  Service: Orthopedics;  Laterality: Right;   [place] Right 11/23/2014   mid foot   CARDIAC CATHETERIZATION  08/2004   FOOT ARTHRODESIS Right 11/23/2014   Procedure: RIGHT MID FOOT ARTHRODESIS;  Surgeon: Wylene Simmer, MD;  Location: [place];  Service: Orthopedics;  Laterality: Right;   METATARSAL OSTEOTOMY Right 11/23/2014   Procedure: RIGHT MID FOOT OSTEOTOMY;  Surgeon: Wylene Simmer, MD;  Location: [place];  Service: Orthopedics;  Laterality: Right;   ORIF ANKLE FRACTURE Left 05/16/2021   Procedure: OPEN REDUCTION INTERNAL FIXATION (ORIF) Left ankle lateral malleolus, possible deltoid ligament repair;  Surgeon: Wylene Simmer, MD;  Location: [place];  Service: Orthopedics;  Laterality: Left;   OSTEOTOMY Right 11/23/2014   mid  foot   PARTIAL THYMECTOMY  1986   ? side   TONSILLECTOMY  1954    There were no vitals filed for this visit.   Subjective Assessment - 03/28/22 1034     Subjective shld is better. vacation was great and I did pretty well getting around    Currently in Pain? No/denies                               [facility] Adult PT Treatment/Exercise - 03/28/22 0001       Ambulation/Gait   Gait Comments gait outside out back door, negotiated 3 curbs SBA around to front door. 1 seated rest      Knee/Hip Exercises: Aerobic   Nustep level 5 x 7 minutes      Knee/Hip Exercises: Machines for Strengthening   Cybex Knee Extension 10# 3x10    Cybex Knee Flexion 25# 3x10      Knee/Hip Exercises: Seated   Knee/Hip Flexion red tband rows and extension    Sit to Sand 1 set;10 reps;with UE support      Knee/Hip Exercises: Supine   Quad Sets --   assistance with some trying without UE  PT Short Term Goals - 12/30/21 1738       PT SHORT TERM GOAL #1   Title Pt will be I and compliant with HEP    Baseline Patient reports she is performing various exercises she has been given.    Time 3    Period Weeks    Status Achieved               PT Long Term Goals - 03/28/22 1042       PT LONG TERM GOAL #1   Title Pt will improve BERG balance without AD to >18 to show improved balance.    Baseline improved stability with neg curbs    Status On-going      PT LONG TERM GOAL #3   Title increase LE strength to 4+/5    Status On-going      PT LONG TERM GOAL #4   Title increase UE strength to 4+/5    Status Partially Met      PT LONG TERM GOAL #6   Title Decrease TUG to <18 seconds.    Status Partially Met                   Plan - 03/28/22 1044     Clinical Impression Statement pt returns after being on vacation at the beach adn states she did very well. improved gait distance today with increased stability with lifting walker to  neg. curbs. progressing towards goals    PT Treatment/Interventions ADLs/Self Care Home Management;Gait training;Stair training;Functional mobility training;Therapeutic activities;Therapeutic exercise;Balance training;Neuromuscular re-education;Manual techniques;Patient/family education    PT Next Visit Plan try to maximize her function and safety             Patient will benefit from skilled therapeutic intervention in order to improve the following deficits and impairments:  Abnormal gait, Decreased range of motion, Difficulty walking, Decreased endurance, Pain, Decreased activity tolerance, Decreased balance, Decreased strength, Decreased mobility  Visit Diagnosis: Muscle weakness (generalized)  Unsteady gait  Difficulty in walking, not elsewhere classified     Problem List Patient Active Problem List   Diagnosis Date Noted   Closed low lateral malleolus fracture 05/16/2021   Gout 05/16/2021   Hyperlipemia 05/16/2021   Hypertension 05/16/2021   Type II diabetes mellitus ([place]) 05/16/2021   Spinal stenosis of lumbar region 03/10/2019   Cervical myelopathy ([place]) 03/10/2019   Peripheral neuropathy 02/02/2019   Gait abnormality 02/02/2019   Urinary urgency 02/02/2019   Chronic bilateral low back pain without sciatica 02/02/2019   DOE (dyspnea on exertion) 10/05/2017   Bruit of right carotid artery 10/05/2017   Coronary artery calcification seen on CT scan 10/05/2017   Chest pain 10/04/2017   Aortic atherosclerosis ([place]) 10/04/2017   Charcot foot due to diabetes mellitus ([place]) 11/23/2014    Safiatou Islam,ANGIE, PTA 03/28/2022, 10:45 AM  [place]. [place], [place], [address] Phone: [phone]   Fax:  [phone]  Name: Pamela Snyder MRN: [id number] Date of Birth: 30-Mar-1946

## 2022-04-01 ENCOUNTER — Ambulatory Visit: Payer: Medicare Other | Admitting: Physical Therapy

## 2022-04-01 ENCOUNTER — Encounter: Payer: Self-pay | Admitting: Physical Therapy

## 2022-04-01 DIAGNOSIS — R262 Difficulty in walking, not elsewhere classified: Secondary | ICD-10-CM | POA: Diagnosis not present

## 2022-04-01 DIAGNOSIS — R2681 Unsteadiness on feet: Secondary | ICD-10-CM

## 2022-04-01 DIAGNOSIS — M6281 Muscle weakness (generalized): Secondary | ICD-10-CM | POA: Diagnosis not present

## 2022-04-01 NOTE — Therapy (Signed)
[address] [place], [place], [address] Phone: [phone]   Fax:  [phone]  Physical Therapy Treatment  Patient Details  Name: Pamela Snyder MRN: [id number] Date of Birth: 05-21-1946 Referring Provider (PT): Alfredo Martinez   Encounter Date: 04/01/2022   PT End of Session - 04/01/22 1717     Visit Number 64    Date for PT Re-Evaluation 05/01/22    PT Start Time 1707    PT Stop Time 1745    PT Time Calculation (min) 38 min    Equipment Utilized During Treatment Gait belt    Activity Tolerance Patient tolerated treatment well    Behavior During Therapy [facility] for tasks assessed/performed             Past Medical History:  Diagnosis Date   Anxiety    Arthritis    "right foot; spine; hands" (11/23/2014)   Charcot foot due to diabetes mellitus ([place])    Depression    GERD (gastroesophageal reflux disease)    Gout    Hyperlipemia    Hypertension    Migraine    hx   Neuropathy    Numbness    Thyroid goiter 1986   Type II diabetes mellitus ([place])     Past Surgical History:  Procedure Laterality Date   ABDOMINAL HYSTERECTOMY  1999   w/BSO   ACHILLES TENDON LENGTHENING Right 11/23/2014   ACHILLES TENDON LENGTHENING Right 11/23/2014   Procedure: RIGHT ACHILLES PERCUTANEOUS TENDON LENGTHENING;  Surgeon: Wylene Simmer, MD;  Location: [place];  Service: Orthopedics;  Laterality: Right;   [place] Right 11/23/2014   mid foot   CARDIAC CATHETERIZATION  08/2004   FOOT ARTHRODESIS Right 11/23/2014   Procedure: RIGHT MID FOOT ARTHRODESIS;  Surgeon: Wylene Simmer, MD;  Location: [place];  Service: Orthopedics;  Laterality: Right;   METATARSAL OSTEOTOMY Right 11/23/2014   Procedure: RIGHT MID FOOT OSTEOTOMY;  Surgeon: Wylene Simmer, MD;  Location: [place];  Service: Orthopedics;  Laterality: Right;   ORIF ANKLE FRACTURE Left 05/16/2021   Procedure: OPEN REDUCTION INTERNAL FIXATION (ORIF) Left ankle lateral  malleolus, possible deltoid ligament repair;  Surgeon: Wylene Simmer, MD;  Location: [place];  Service: Orthopedics;  Laterality: Left;   OSTEOTOMY Right 11/23/2014   mid foot   PARTIAL THYMECTOMY  1986   ? side   TONSILLECTOMY  1954    There were no vitals filed for this visit.   Subjective Assessment - 04/01/22 1717     Subjective Patient was pleased that she was able to go on vacation without too much difficulty, reports some difficulty at times but did well    Currently in Pain? No/denies                               [facility] Adult PT Treatment/Exercise - 04/01/22 0001       Ambulation/Gait   Gait Comments gait outside out back door, negotiated 3 curbs SBA around to front door. 1 seated rest      High Level Balance   High Level Balance Comments seated beach ball volley ball      Knee/Hip Exercises: Machines for Strengthening   Cybex Knee Extension 10# 3x10    Cybex Knee Flexion 25# 3x10      Knee/Hip Exercises: Standing   Other Standing Knee Exercises standing with knees touching the mat table rows and extension  PT Short Term Goals - 12/30/21 1738       PT SHORT TERM GOAL #1   Title Pt will be I and compliant with HEP    Baseline Patient reports she is performing various exercises she has been given.    Time 3    Period Weeks    Status Achieved               PT Long Term Goals - 04/01/22 1718       PT LONG TERM GOAL #1   Title Pt will improve BERG balance without AD to >18 to show improved balance.    Status On-going      PT LONG TERM GOAL #2   Title Pt will improve gait speed and TUG time to show improved gait and balance below 24 seconds    Status Achieved      PT LONG TERM GOAL #3   Title increase LE strength to 4+/5    Status On-going      PT LONG TERM GOAL #4   Title increase UE strength to 4+/5    Status Partially Met      PT LONG TERM GOAL #5   Title report able to make a sandwich  without sitting    Status Achieved      PT LONG TERM GOAL #6   Title Decrease TUG to <18 seconds.    Status Partially Met                   Plan - 04/01/22 1823     Clinical Impression Statement Patient has made improvements with her ability to walk distances, she has minimal to no mms at the ankles and the feet.  We have really stopped addressing this and started more on other strategies for balance and working on strength of the knees and the hips as well as balance and core, safety has been a big issue as she has weak shoulders and with lifting the walker up and down a curb tends to bve dangerous but is much improved recently.    PT Frequency 2x / week    PT Duration 6 weeks    PT Treatment/Interventions ADLs/Self Care Home Management;Gait training;Stair training;Functional mobility training;Therapeutic activities;Therapeutic exercise;Balance training;Neuromuscular re-education;Manual techniques;Patient/family education    PT Next Visit Plan will continue to work to maximize her function and safety    Consulted and Agree with Plan of Care Patient             Patient will benefit from skilled therapeutic intervention in order to improve the following deficits and impairments:  Abnormal gait, Decreased range of motion, Difficulty walking, Decreased endurance, Pain, Decreased activity tolerance, Decreased balance, Decreased strength, Decreased mobility  Visit Diagnosis: Muscle weakness (generalized)  Unsteady gait  Difficulty in walking, not elsewhere classified     Problem List Patient Active Problem List   Diagnosis Date Noted   Closed low lateral malleolus fracture 05/16/2021   Gout 05/16/2021   Hyperlipemia 05/16/2021   Hypertension 05/16/2021   Type II diabetes mellitus ([place]) 05/16/2021   Spinal stenosis of lumbar region 03/10/2019   Cervical myelopathy ([place]) 03/10/2019   Peripheral neuropathy 02/02/2019   Gait abnormality 02/02/2019   Urinary urgency  02/02/2019   Chronic bilateral low back pain without sciatica 02/02/2019   DOE (dyspnea on exertion) 10/05/2017   Bruit of right carotid artery 10/05/2017   Coronary artery calcification seen on CT scan 10/05/2017   Chest pain 10/04/2017   Aortic  atherosclerosis ([place]) 10/04/2017   Charcot foot due to diabetes mellitus ([place]) 11/23/2014    Sumner Boast, PT 04/01/2022, 6:26 PM  [address] [place]. [place], [place], [address] Phone: [phone]   Fax:  [phone]  Name: Pamela Snyder MRN: [id number] Date of Birth: 11-19-1945

## 2022-04-03 ENCOUNTER — Ambulatory Visit: Payer: Medicare Other | Admitting: Physical Therapy

## 2022-04-03 ENCOUNTER — Encounter: Payer: Self-pay | Admitting: Physical Therapy

## 2022-04-03 DIAGNOSIS — R262 Difficulty in walking, not elsewhere classified: Secondary | ICD-10-CM

## 2022-04-03 DIAGNOSIS — R2681 Unsteadiness on feet: Secondary | ICD-10-CM | POA: Diagnosis not present

## 2022-04-03 DIAGNOSIS — M6281 Muscle weakness (generalized): Secondary | ICD-10-CM | POA: Diagnosis not present

## 2022-04-03 NOTE — Therapy (Signed)
[address] [place], [place], [address] Phone: [phone]   Fax:  [phone]  Physical Therapy Treatment  Patient Details  Name: Pamela Snyder MRN: [id number] Date of Birth: [birth year] Referring Provider (PT): Alfredo Martinez   Encounter Date: 04/03/2022   PT End of Session - 04/03/22 1705     Visit Number 4    Date for PT Re-Evaluation 05/01/22    PT Start Time 1704    PT Stop Time 1746    PT Time Calculation (min) 42 min    Equipment Utilized During Treatment Gait belt    Activity Tolerance Patient tolerated treatment well    Behavior During Therapy [facility] for tasks assessed/performed             Past Medical History:  Diagnosis Date   Anxiety    Arthritis    "right foot; spine; hands" (11/23/2014)   Charcot foot due to diabetes mellitus ([place])    Depression    GERD (gastroesophageal reflux disease)    Gout    Hyperlipemia    Hypertension    Migraine    hx   Neuropathy    Numbness    Thyroid goiter 1986   Type II diabetes mellitus ([place])     Past Surgical History:  Procedure Laterality Date   ABDOMINAL HYSTERECTOMY  1999   w/BSO   ACHILLES TENDON LENGTHENING Right 11/23/2014   ACHILLES TENDON LENGTHENING Right 11/23/2014   Procedure: RIGHT ACHILLES PERCUTANEOUS TENDON LENGTHENING;  Surgeon: Wylene Simmer, MD;  Location: [place];  Service: Orthopedics;  Laterality: Right;   [place] Right 11/23/2014   mid foot   CARDIAC CATHETERIZATION  08/2004   FOOT ARTHRODESIS Right 11/23/2014   Procedure: RIGHT MID FOOT ARTHRODESIS;  Surgeon: Wylene Simmer, MD;  Location: [place];  Service: Orthopedics;  Laterality: Right;   METATARSAL OSTEOTOMY Right 11/23/2014   Procedure: RIGHT MID FOOT OSTEOTOMY;  Surgeon: Wylene Simmer, MD;  Location: [place];  Service: Orthopedics;  Laterality: Right;   ORIF ANKLE FRACTURE Left 05/16/2021   Procedure: OPEN REDUCTION INTERNAL FIXATION (ORIF) Left ankle lateral  malleolus, possible deltoid ligament repair;  Surgeon: Wylene Simmer, MD;  Location: [place];  Service: Orthopedics;  Laterality: Left;   OSTEOTOMY Right 11/23/2014   mid foot   PARTIAL THYMECTOMY  1986   ? side   TONSILLECTOMY  1954    There were no vitals filed for this visit.   Subjective Assessment - 04/03/22 1706     Subjective No issues, feeling okay    Currently in Pain? No/denies                               [facility] Adult PT Treatment/Exercise - 04/03/22 0001       Ambulation/Gait   Gait Comments gait outside around the back building island with FWW and CGA with gait belt      Knee/Hip Exercises: Aerobic   Nustep level 5 x 6 minutes      Knee/Hip Exercises: Machines for Strengthening   Cybex Knee Extension 10# 3x10    Cybex Knee Flexion 25# 3x10    Other Machine seated row 20#, lats 20# 2x10                       PT Short Term Goals - 12/30/21 1738       PT SHORT TERM GOAL #  1   Title Pt will be I and compliant with HEP    Baseline Patient reports she is performing various exercises she has been given.    Time 3    Period Weeks    Status Achieved               PT Long Term Goals - 04/01/22 1718       PT LONG TERM GOAL #1   Title Pt will improve BERG balance without AD to >18 to show improved balance.    Status On-going      PT LONG TERM GOAL #2   Title Pt will improve gait speed and TUG time to show improved gait and balance below 24 seconds    Status Achieved      PT LONG TERM GOAL #3   Title increase LE strength to 4+/5    Status On-going      PT LONG TERM GOAL #4   Title increase UE strength to 4+/5    Status Partially Met      PT LONG TERM GOAL #5   Title report able to make a sandwich without sitting    Status Achieved      PT LONG TERM GOAL #6   Title Decrease TUG to <18 seconds.    Status Partially Met                   Plan - 04/03/22 1848     Clinical Impression Statement Started  talking to patient about what to do after PT, she feels isolated and does not want to rely on her self to do the exercises at home.  She has worries about tryin gto get into a gym due to walking difficulty, she did like the thought of the independent program but will need some further instruction and how to, she did well with set up today and close supervision    PT Next Visit Plan work on D/C and how to do safe exercises at home or in the gym here    Consulted and Agree with Plan of Care Patient             Patient will benefit from skilled therapeutic intervention in order to improve the following deficits and impairments:  Abnormal gait, Decreased range of motion, Difficulty walking, Decreased endurance, Pain, Decreased activity tolerance, Decreased balance, Decreased strength, Decreased mobility  Visit Diagnosis: Muscle weakness (generalized)  Unsteady gait  Difficulty in walking, not elsewhere classified     Problem List Patient Active Problem List   Diagnosis Date Noted   Closed low lateral malleolus fracture 05/16/2021   Gout 05/16/2021   Hyperlipemia 05/16/2021   Hypertension 05/16/2021   Type II diabetes mellitus ([place]) 05/16/2021   Spinal stenosis of lumbar region 03/10/2019   Cervical myelopathy ([place]) 03/10/2019   Peripheral neuropathy 02/02/2019   Gait abnormality 02/02/2019   Urinary urgency 02/02/2019   Chronic bilateral low back pain without sciatica 02/02/2019   DOE (dyspnea on exertion) 10/05/2017   Bruit of right carotid artery 10/05/2017   Coronary artery calcification seen on CT scan 10/05/2017   Chest pain 10/04/2017   Aortic atherosclerosis ([place]) 10/04/2017   Charcot foot due to diabetes mellitus ([place]) 11/23/2014    Sumner Boast, PT 04/03/2022, 6:51 PM  [place]. [place], [place], [address] Phone: [phone]   Fax:  [phone]  Name: Meshell Abdulaziz MRN:  [id number] Date of Birth: [birth year]

## 2022-04-08 ENCOUNTER — Ambulatory Visit: Payer: Medicare Other | Admitting: Physical Therapy

## 2022-04-10 ENCOUNTER — Ambulatory Visit: Payer: Medicare Other

## 2022-04-15 ENCOUNTER — Encounter: Payer: Self-pay | Admitting: Physical Therapy

## 2022-04-15 ENCOUNTER — Ambulatory Visit: Payer: Medicare Other | Attending: Student | Admitting: Physical Therapy

## 2022-04-15 DIAGNOSIS — R2681 Unsteadiness on feet: Secondary | ICD-10-CM | POA: Diagnosis not present

## 2022-04-15 DIAGNOSIS — M6281 Muscle weakness (generalized): Secondary | ICD-10-CM

## 2022-04-15 DIAGNOSIS — R262 Difficulty in walking, not elsewhere classified: Secondary | ICD-10-CM

## 2022-04-15 NOTE — Therapy (Signed)
[address] [place], [place], [address] Phone: [phone]   Fax:  [phone]  Physical Therapy Treatment  Patient Details  Name: Pamela Snyder MRN: [id number] Date of Birth: 11-Apr-1946 Referring Provider (PT): Alfredo Martinez   Encounter Date: 04/15/2022   PT End of Session - 04/15/22 1706     Visit Number 48    Date for PT Re-Evaluation 05/01/22    PT Start Time 1700    PT Stop Time 1745    PT Time Calculation (min) 45 min    Equipment Utilized During Treatment Gait belt    Activity Tolerance Patient tolerated treatment well    Behavior During Therapy [facility] for tasks assessed/performed             Past Medical History:  Diagnosis Date   Anxiety    Arthritis    "right foot; spine; hands" (11/23/2014)   Charcot foot due to diabetes mellitus ([place])    Depression    GERD (gastroesophageal reflux disease)    Gout    Hyperlipemia    Hypertension    Migraine    hx   Neuropathy    Numbness    Thyroid goiter 1986   Type II diabetes mellitus ([place])     Past Surgical History:  Procedure Laterality Date   ABDOMINAL HYSTERECTOMY  1999   w/BSO   ACHILLES TENDON LENGTHENING Right 11/23/2014   ACHILLES TENDON LENGTHENING Right 11/23/2014   Procedure: RIGHT ACHILLES PERCUTANEOUS TENDON LENGTHENING;  Surgeon: Wylene Simmer, MD;  Location: [place];  Service: Orthopedics;  Laterality: Right;   [place] Right 11/23/2014   mid foot   CARDIAC CATHETERIZATION  08/2004   FOOT ARTHRODESIS Right 11/23/2014   Procedure: RIGHT MID FOOT ARTHRODESIS;  Surgeon: Wylene Simmer, MD;  Location: [place];  Service: Orthopedics;  Laterality: Right;   METATARSAL OSTEOTOMY Right 11/23/2014   Procedure: RIGHT MID FOOT OSTEOTOMY;  Surgeon: Wylene Simmer, MD;  Location: [place];  Service: Orthopedics;  Laterality: Right;   ORIF ANKLE FRACTURE Left 05/16/2021   Procedure: OPEN REDUCTION INTERNAL FIXATION (ORIF) Left ankle lateral  malleolus, possible deltoid ligament repair;  Surgeon: Wylene Simmer, MD;  Location: [place];  Service: Orthopedics;  Laterality: Left;   OSTEOTOMY Right 11/23/2014   mid foot   PARTIAL THYMECTOMY  1986   ? side   TONSILLECTOMY  1954    There were no vitals filed for this visit.   Subjective Assessment - 04/15/22 1730     Subjective I have been okay, a little dangerous at times, but no falls    Currently in Pain? No/denies                               [facility] Adult PT Treatment/Exercise - 04/15/22 0001       Ambulation/Gait   Gait Comments gait outside around the back building island with FWW and CGA with gait belt      Knee/Hip Exercises: Aerobic   Nustep level 5 x 6 minutes    Other Aerobic UBE level 2 x 4 minutes      Knee/Hip Exercises: Machines for Strengthening   Cybex Knee Extension 10# 3x10    Cybex Knee Flexion 25# 3x10                       PT Short Term Goals - 12/30/21 1738  PT SHORT TERM GOAL #1   Title Pt will be I and compliant with HEP    Baseline Patient reports she is performing various exercises she has been given.    Time 3    Period Weeks    Status Achieved               PT Long Term Goals - 04/15/22 1738       PT LONG TERM GOAL #1   Title Pt will improve BERG balance without AD to >18 to show improved balance.    Status Not Met      PT LONG TERM GOAL #2   Title Pt will improve gait speed and TUG time to show improved gait and balance below 24 seconds    Status Achieved      PT LONG TERM GOAL #3   Title increase LE strength to 4+/5    Status Partially Met      PT LONG TERM GOAL #4   Title increase UE strength to 4+/5    Status Partially Met      PT LONG TERM GOAL #5   Title report able to make a sandwich without sitting    Status Achieved                   Plan - 04/15/22 1738     Clinical Impression Statement Continuation of the instruction for her to be modified independent  with the gym program, education on the nmachines, adjustments and weights, She did well with most, needed some guidance and assist with others, but seemed to understand well    PT Next Visit Plan one more visit and work on her safety and independence with the gym program    Consulted and Agree with Plan of Care Patient             Patient will benefit from skilled therapeutic intervention in order to improve the following deficits and impairments:  Abnormal gait, Decreased range of motion, Difficulty walking, Decreased endurance, Pain, Decreased activity tolerance, Decreased balance, Decreased strength, Decreased mobility  Visit Diagnosis: Muscle weakness (generalized)  Unsteady gait  Difficulty in walking, not elsewhere classified     Problem List Patient Active Problem List   Diagnosis Date Noted   Closed low lateral malleolus fracture 05/16/2021   Gout 05/16/2021   Hyperlipemia 05/16/2021   Hypertension 05/16/2021   Type II diabetes mellitus ([place]) 05/16/2021   Spinal stenosis of lumbar region 03/10/2019   Cervical myelopathy ([place]) 03/10/2019   Peripheral neuropathy 02/02/2019   Gait abnormality 02/02/2019   Urinary urgency 02/02/2019   Chronic bilateral low back pain without sciatica 02/02/2019   DOE (dyspnea on exertion) 10/05/2017   Bruit of right carotid artery 10/05/2017   Coronary artery calcification seen on CT scan 10/05/2017   Chest pain 10/04/2017   Aortic atherosclerosis ([place]) 10/04/2017   Charcot foot due to diabetes mellitus ([place]) 11/23/2014    Sumner Boast, PT 04/15/2022, 5:40 PM  [address] [place], [place], [address] Phone: [phone]   Fax:  [phone]  Name: Pamela Snyder MRN: [id number] Date of Birth: 05-16-1946

## 2022-04-17 ENCOUNTER — Ambulatory Visit: Payer: Medicare Other

## 2022-05-01 DIAGNOSIS — H18413 Arcus senilis, bilateral: Secondary | ICD-10-CM | POA: Diagnosis not present

## 2022-05-01 DIAGNOSIS — H2511 Age-related nuclear cataract, right eye: Secondary | ICD-10-CM | POA: Diagnosis not present

## 2022-05-01 DIAGNOSIS — H25043 Posterior subcapsular polar age-related cataract, bilateral: Secondary | ICD-10-CM | POA: Diagnosis not present

## 2022-05-01 DIAGNOSIS — H25013 Cortical age-related cataract, bilateral: Secondary | ICD-10-CM | POA: Diagnosis not present

## 2022-05-01 DIAGNOSIS — H2513 Age-related nuclear cataract, bilateral: Secondary | ICD-10-CM | POA: Diagnosis not present

## 2022-05-26 DIAGNOSIS — M85851 Other specified disorders of bone density and structure, right thigh: Secondary | ICD-10-CM | POA: Diagnosis not present

## 2022-05-26 DIAGNOSIS — M85852 Other specified disorders of bone density and structure, left thigh: Secondary | ICD-10-CM | POA: Diagnosis not present

## 2022-05-26 DIAGNOSIS — Z780 Asymptomatic menopausal state: Secondary | ICD-10-CM | POA: Diagnosis not present

## 2022-06-30 IMAGING — MG MM BREAST LOCALIZATION CLIP
4 series · 4 of 12 positions shown · non-contrast
Comparison: Previous exam(s).

CLINICAL DATA: Evaluate biopsy marker

EXAM:
DIAGNOSTIC RIGHT MAMMOGRAM POST STEREOTACTIC BIOPSY

[R ML synth-2D]
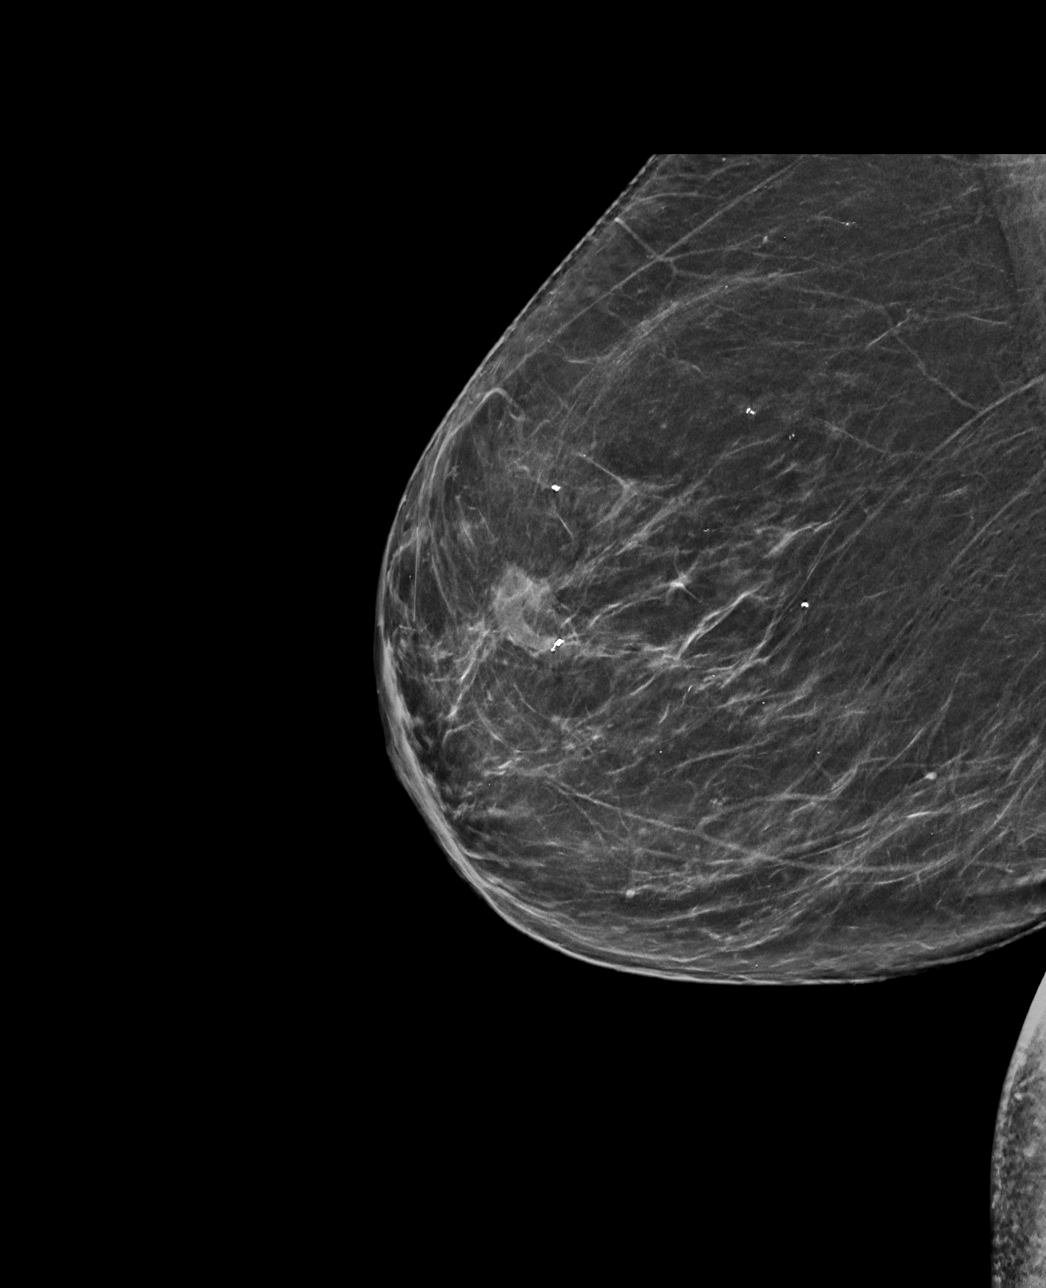

[R CC synth-2D]
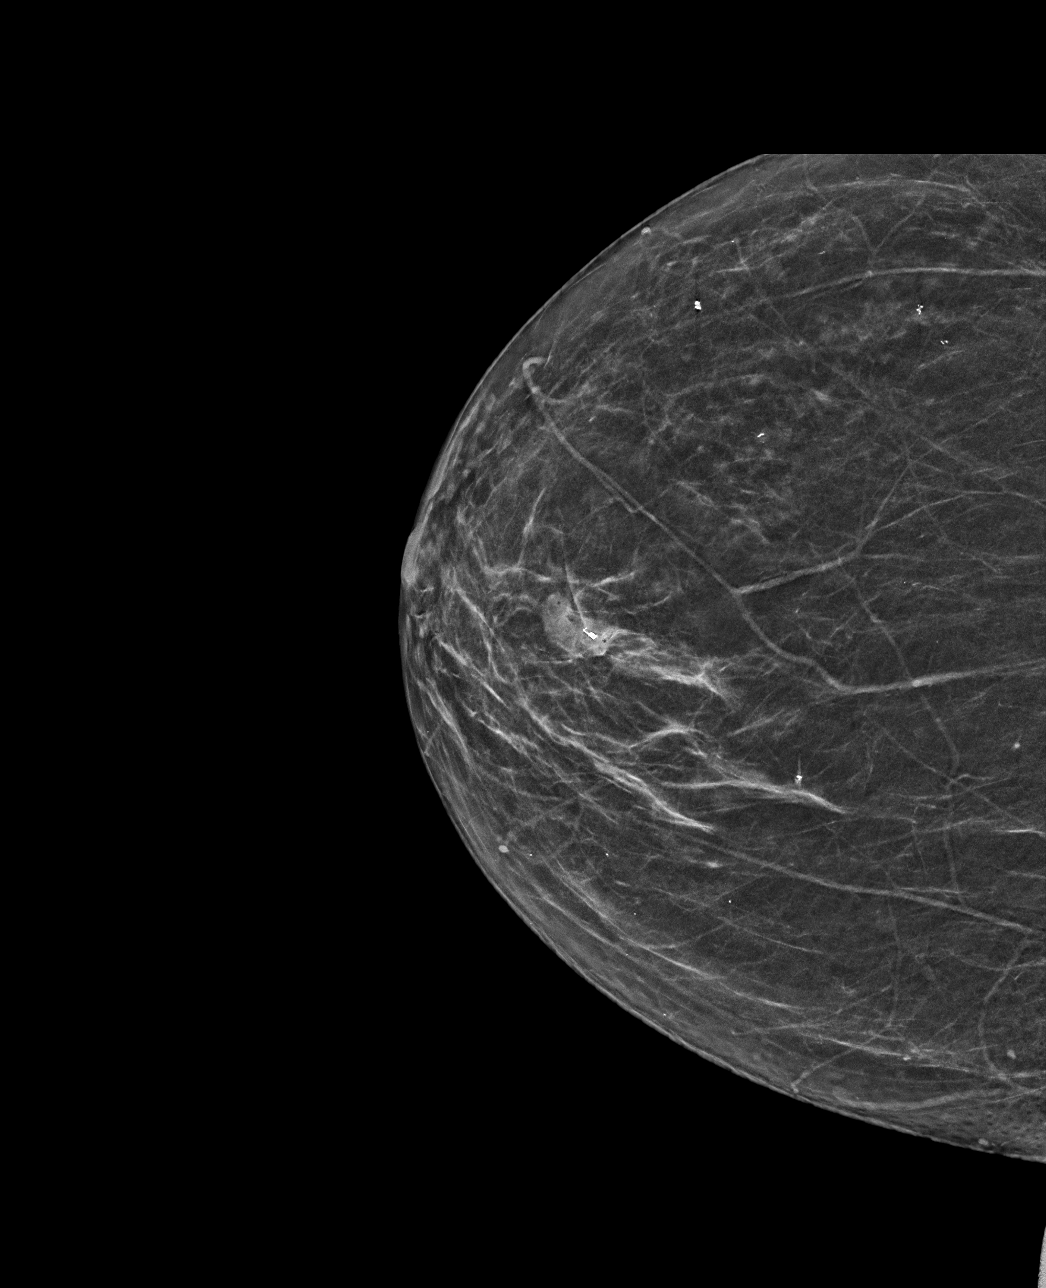

[R ML tomo · tomo slice 34/67.0]
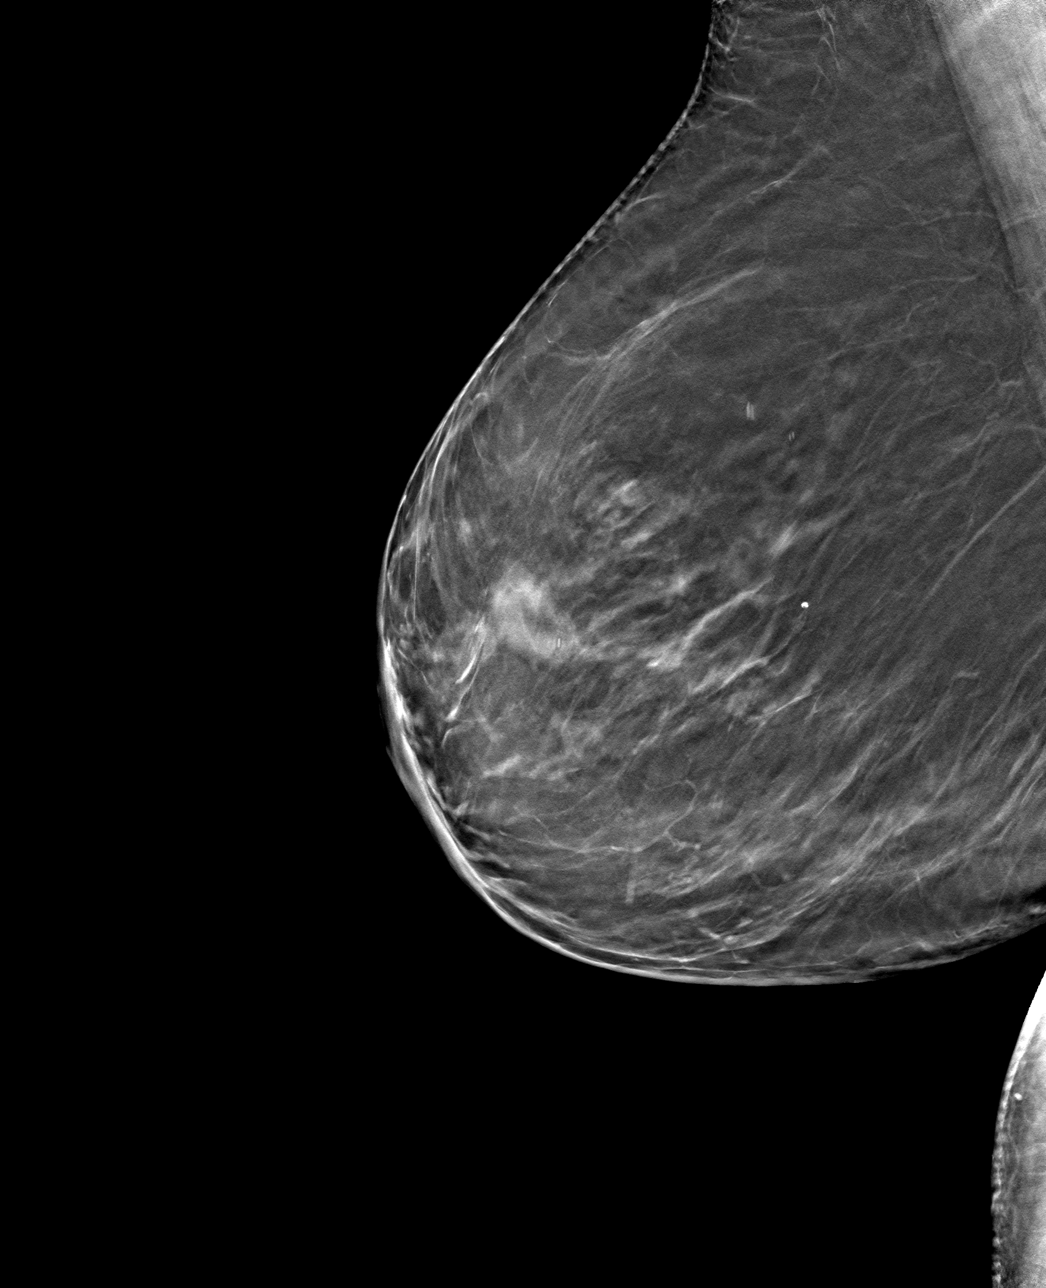

[R CC tomo · tomo slice 28/55.0]
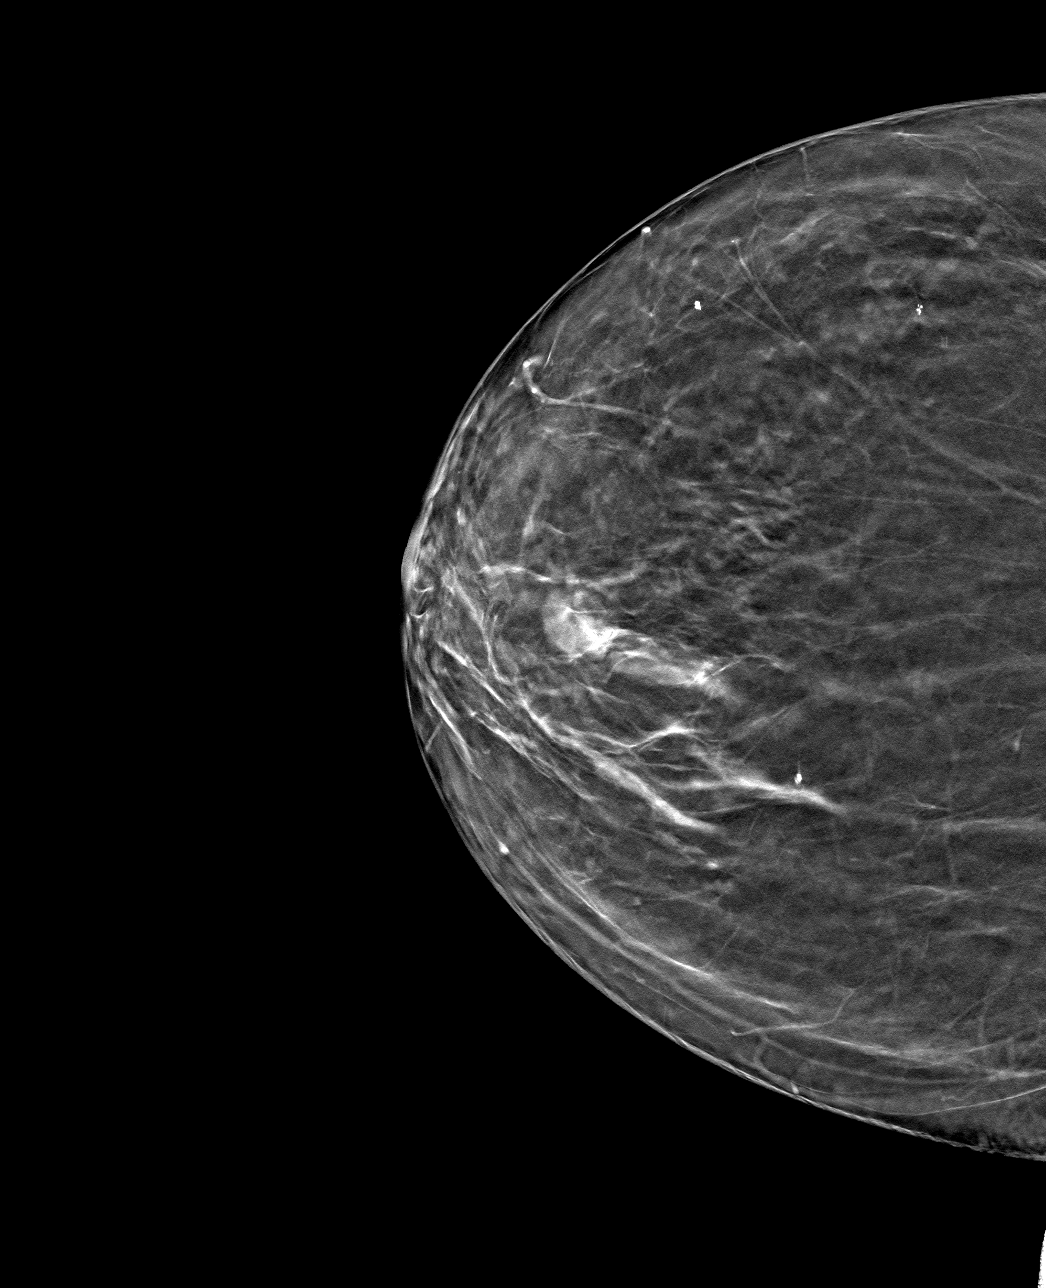

[4 of 12 positions shown; findings below may reference images not displayed]

FINDINGS: Mammographic images were obtained following stereotactic guided
biopsy of a right breast asymmetry. The biopsy marking clip is in
expected position at the site of biopsy.
IMPRESSION: Appropriate positioning of the coil shaped biopsy marking clip at
the site of biopsy in the region of the biopsied asymmetry.

Final Assessment: Post Procedure Mammograms for Marker Placement

## 2022-07-30 DIAGNOSIS — H2511 Age-related nuclear cataract, right eye: Secondary | ICD-10-CM | POA: Diagnosis not present

## 2022-07-31 DIAGNOSIS — H2512 Age-related nuclear cataract, left eye: Secondary | ICD-10-CM | POA: Diagnosis not present

## 2022-08-05 DIAGNOSIS — E559 Vitamin D deficiency, unspecified: Secondary | ICD-10-CM | POA: Diagnosis not present

## 2022-08-05 DIAGNOSIS — M810 Age-related osteoporosis without current pathological fracture: Secondary | ICD-10-CM | POA: Diagnosis not present

## 2022-08-05 DIAGNOSIS — R5383 Other fatigue: Secondary | ICD-10-CM | POA: Diagnosis not present

## 2022-08-12 DIAGNOSIS — E559 Vitamin D deficiency, unspecified: Secondary | ICD-10-CM | POA: Diagnosis not present

## 2022-08-12 DIAGNOSIS — M810 Age-related osteoporosis without current pathological fracture: Secondary | ICD-10-CM | POA: Diagnosis not present

## 2022-08-20 DIAGNOSIS — H2512 Age-related nuclear cataract, left eye: Secondary | ICD-10-CM | POA: Diagnosis not present

## 2022-09-02 ENCOUNTER — Other Ambulatory Visit: Payer: Self-pay | Admitting: Family Medicine

## 2022-09-02 DIAGNOSIS — Z1231 Encounter for screening mammogram for malignant neoplasm of breast: Secondary | ICD-10-CM

## 2022-10-31 ENCOUNTER — Other Ambulatory Visit: Payer: Self-pay | Admitting: Family Medicine

## 2022-10-31 DIAGNOSIS — E559 Vitamin D deficiency, unspecified: Secondary | ICD-10-CM | POA: Diagnosis not present

## 2022-10-31 DIAGNOSIS — Z6835 Body mass index (BMI) 35.0-35.9, adult: Secondary | ICD-10-CM | POA: Diagnosis not present

## 2022-10-31 DIAGNOSIS — N183 Chronic kidney disease, stage 3 unspecified: Secondary | ICD-10-CM | POA: Diagnosis not present

## 2022-10-31 DIAGNOSIS — E1143 Type 2 diabetes mellitus with diabetic autonomic (poly)neuropathy: Secondary | ICD-10-CM | POA: Diagnosis not present

## 2022-10-31 DIAGNOSIS — E7800 Pure hypercholesterolemia, unspecified: Secondary | ICD-10-CM | POA: Diagnosis not present

## 2022-10-31 DIAGNOSIS — M810 Age-related osteoporosis without current pathological fracture: Secondary | ICD-10-CM | POA: Diagnosis not present

## 2022-10-31 DIAGNOSIS — I2510 Atherosclerotic heart disease of native coronary artery without angina pectoris: Secondary | ICD-10-CM | POA: Diagnosis not present

## 2022-10-31 DIAGNOSIS — Z8739 Personal history of other diseases of the musculoskeletal system and connective tissue: Secondary | ICD-10-CM | POA: Diagnosis not present

## 2022-10-31 DIAGNOSIS — K219 Gastro-esophageal reflux disease without esophagitis: Secondary | ICD-10-CM | POA: Diagnosis not present

## 2022-10-31 DIAGNOSIS — M25512 Pain in left shoulder: Secondary | ICD-10-CM | POA: Diagnosis not present

## 2022-10-31 DIAGNOSIS — R062 Wheezing: Secondary | ICD-10-CM

## 2022-10-31 DIAGNOSIS — I10 Essential (primary) hypertension: Secondary | ICD-10-CM | POA: Diagnosis not present

## 2022-10-31 DIAGNOSIS — Z23 Encounter for immunization: Secondary | ICD-10-CM | POA: Diagnosis not present

## 2022-11-03 ENCOUNTER — Other Ambulatory Visit: Payer: Self-pay | Admitting: Family Medicine

## 2022-11-03 DIAGNOSIS — M25461 Effusion, right knee: Secondary | ICD-10-CM

## 2022-11-06 ENCOUNTER — Ambulatory Visit
Admission: RE | Admit: 2022-11-06 | Discharge: 2022-11-06 | Disposition: A | Discharge status: Home / Self Care | Payer: Medicare Other | Source: Ambulatory Visit | Attending: Family Medicine | Admitting: Family Medicine

## 2022-11-06 DIAGNOSIS — R062 Wheezing: Secondary | ICD-10-CM

## 2022-11-06 DIAGNOSIS — I700 Atherosclerosis of aorta: Secondary | ICD-10-CM | POA: Diagnosis not present

## 2022-11-06 DIAGNOSIS — R059 Cough, unspecified: Secondary | ICD-10-CM | POA: Diagnosis not present

## 2022-11-10 ENCOUNTER — Ambulatory Visit
Admission: RE | Admit: 2022-11-10 | Discharge: 2022-11-10 | Disposition: A | Discharge status: Home / Self Care | Payer: Medicare Other | Source: Ambulatory Visit | Attending: Family Medicine | Admitting: Family Medicine

## 2022-11-10 DIAGNOSIS — Z1231 Encounter for screening mammogram for malignant neoplasm of breast: Secondary | ICD-10-CM

## 2022-11-17 ENCOUNTER — Encounter: Payer: Self-pay | Admitting: Physical Therapy

## 2022-11-17 ENCOUNTER — Ambulatory Visit: Payer: Medicare Other | Attending: Family Medicine | Admitting: Physical Therapy

## 2022-11-17 DIAGNOSIS — R262 Difficulty in walking, not elsewhere classified: Secondary | ICD-10-CM | POA: Diagnosis not present

## 2022-11-17 DIAGNOSIS — M6281 Muscle weakness (generalized): Secondary | ICD-10-CM | POA: Insufficient documentation

## 2022-11-17 DIAGNOSIS — R2681 Unsteadiness on feet: Secondary | ICD-10-CM | POA: Insufficient documentation

## 2022-11-17 NOTE — Therapy (Signed)
OUTPATIENT PHYSICAL THERAPY LOWER EXTREMITY EVALUATION   Patient Name: Pamela Snyder MRN: [id number] DOB:1945/12/04, 77 y.o., female Today's Date: 11/17/2022  END OF SESSION:  PT End of Session - 11/17/22 1608     Visit Number 1    Date for PT Re-Evaluation 02/17/23    Authorization Type Medicare    PT Start Time 1607    PT Stop Time 1700    PT Time Calculation (min) 53 min    Activity Tolerance Patient tolerated treatment well    Behavior During Therapy WFL for tasks assessed/performed             Past Medical History:  Diagnosis Date   Anxiety    Arthritis    "right foot; spine; hands" (11/23/2014)   Charcot foot due to diabetes mellitus (HCC)    Depression    GERD (gastroesophageal reflux disease)    Gout    Hyperlipemia    Hypertension    Migraine    hx   Neuropathy    Numbness    Thyroid goiter 1986   Type II diabetes mellitus (HCC)    Past Surgical History:  Procedure Laterality Date   ABDOMINAL HYSTERECTOMY  1999   w/BSO   ACHILLES TENDON LENGTHENING Right 11/23/2014   ACHILLES TENDON LENGTHENING Right 11/23/2014   Procedure: RIGHT ACHILLES PERCUTANEOUS TENDON LENGTHENING;  Surgeon: Toni Arthurs, MD;  Location: [facility];  Service: Orthopedics;  Laterality: Right;   APPENDECTOMY  1963   ARTHRODESIS Right 11/23/2014   mid foot   CARDIAC CATHETERIZATION  08/2004   FOOT ARTHRODESIS Right 11/23/2014   Procedure: RIGHT MID FOOT ARTHRODESIS;  Surgeon: Toni Arthurs, MD;  Location: [facility];  Service: Orthopedics;  Laterality: Right;   METATARSAL OSTEOTOMY Right 11/23/2014   Procedure: RIGHT MID FOOT OSTEOTOMY;  Surgeon: Toni Arthurs, MD;  Location: [facility];  Service: Orthopedics;  Laterality: Right;   ORIF ANKLE FRACTURE Left 05/16/2021   Procedure: OPEN REDUCTION INTERNAL FIXATION (ORIF) Left ankle lateral malleolus, possible deltoid ligament repair;  Surgeon: Toni Arthurs, MD;  Location: [facility];  Service: Orthopedics;  Laterality: Left;   OSTEOTOMY Right 11/23/2014    mid foot   PARTIAL THYMECTOMY  1986   ? side   TONSILLECTOMY  1954   Patient Active Problem List   Diagnosis Date Noted   Closed low lateral malleolus fracture 05/16/2021   Gout 05/16/2021   Hyperlipemia 05/16/2021   Hypertension 05/16/2021   Type II diabetes mellitus (HCC) 05/16/2021   Spinal stenosis of lumbar region 03/10/2019   Cervical myelopathy (HCC) 03/10/2019   Peripheral neuropathy 02/02/2019   Gait abnormality 02/02/2019   Urinary urgency 02/02/2019   Chronic bilateral low back pain without sciatica 02/02/2019   DOE (dyspnea on exertion) 10/05/2017   Bruit of right carotid artery 10/05/2017   Coronary artery calcification seen on CT scan 10/05/2017   Chest pain 10/04/2017   Aortic atherosclerosis (HCC) 10/04/2017   Charcot foot due to diabetes mellitus (HCC) 11/23/2014    PCP: Jarrett Soho, PA  REFERRING PROVIDER: Delena Serve, PA  REFERRING DIAG: poor balance, weakness, difficulty walking  THERAPY DIAG:  Muscle weakness (generalized)  Unsteady gait  Difficulty in walking, not elsewhere classified  Rationale for Evaluation and Treatment: Rehabilitation  ONSET DATE: 11/03/22  SUBJECTIVE:   SUBJECTIVE STATEMENT: Had a fall in November 2022 and sustained a fracture of the left Malleolar fracture with ORIF.  She was suffering from neuropathy, had just started to use a walker but prior to that did not use  a device.  We saw her April 2024 and she progressed well with walking, strength and function.  She returns today due to weakness, difficulty walking, decreased endurance, and unsteady gait  PERTINENT HISTORY: See above PAIN:  Are you having pain? Yes: NPRS scale: 4/10 Pain location: low back Pain description: tight, stabbing Aggravating factors: being on feet longer, twisting pain pain up to 6/10 Relieving factors: rest pain will go to 0/10  PRECAUTIONS: Fall  WEIGHT BEARING RESTRICTIONS: No  FALLS:  Has patient fallen in last 6 months? No, just  unsteady  LIVING ENVIRONMENT: Lives with: lives with their family Lives in: House/apartment Stairs: Yes: Internal: 12 steps; can reach both Has following equipment at home: Dan Humphreys - 2 wheeled  OCCUPATION: retired  PLOF: Independent with household mobility with device and Needs assistance with homemaking  PATIENT GOALS: may go to the beach in the fall, attend women's group, feel stronger, more steady, stay living at home independently  NEXT MD VISIT: none scheduled  OBJECTIVE:   DIAGNOSTIC FINDINGS: none  COGNITION: Overall cognitive status: Within functional limits for tasks assessed     SENSATION: Mid shin down very limited sensation light touch can feel some deeper touch  EDEMA:  Very mild at the ankles  MUSCLE LENGTH: Mild tightness in the HS  POSTURE: rounded shoulders and forward head  PALPATION: Tight and tender in the lumbar paraspinals  LOWER EXTREMITY ROM:  Active ROM Right eval Left eval  Hip flexion 80 80  Hip extension    Hip abduction    Hip adduction    Hip internal rotation    Hip external rotation    Knee flexion    Knee extension 0 0  Ankle dorsiflexion 0 0  Ankle plantarflexion    Ankle inversion    Ankle eversion     (Blank rows = not tested)  LOWER EXTREMITY MMT:  MMT Right eval Left eval  Hip flexion 4 4-  Hip extension 4- 4-  Hip abduction 4 4-  Hip adduction    Hip internal rotation    Hip external rotation    Knee flexion 4 4-  Knee extension 4 3+  Ankle dorsiflexion 2 0  Ankle plantarflexion 1 1  Ankle inversion 1 0  Ankle eversion 0 0   (Blank rows = not tested) FUNCTIONAL TESTS:  TUG: 40 seconds with FWW at evaluation walk test with CGA and FWW = 200 feet at evaluation Unable to stand without holding onto something  GAIT: Distance walked: 200 feet Assistive device utilized: Walker - 2 wheeled Level of assistance: SBA to start and then needs CGA Comments: foot drop bilaterally, decreased foot control,  some hip drop on the left side, when fatigued poor safety with transfers   TODAY'S TREATMENT:                                                                                                                              DATE:  PATIENT EDUCATION:  Education details: POC Person educated: Patient Education method: Explanation Education comprehension: verbalized understanding  HOME EXERCISE PROGRAM: TBD  ASSESSMENT:  CLINICAL IMPRESSION: Patient is a 77 y.o. female who was seen today for physical therapy evaluation and treatment for weakness, unsteady gait, poor balance.  She has minimal movements at the ankles and minimal sensation to the ankles and lower legs.  She was very slow with the TUG and slow with the 3 minute walk test.  Due to the neuropathy her balance is very poor, cannot stand without holding onto something   OBJECTIVE IMPAIRMENTS: Abnormal gait, cardiopulmonary status limiting activity, decreased activity tolerance, decreased balance, decreased coordination, decreased endurance, decreased mobility, difficulty walking, decreased ROM, decreased strength, impaired flexibility, impaired sensation, and pain.   REHAB POTENTIAL: Good  CLINICAL DECISION MAKING: Stable/uncomplicated  EVALUATION COMPLEXITY: Low   GOALS: Goals reviewed with patient? Yes  SHORT TERM GOALS: Target date: 12/02/22 Independent with initial HEP Goal status: INITIAL  LONG TERM GOALS: Target date: 02/17/23  Independent with advanced HEP or gym program Goal status: INITIAL  2.  Decrease TUG time to 24 seconds Goal status: INITIAL  3.  Walk 300 feet in 3 minute walk test Goal status: INITIAL  4.  Be able to negotiate stairs and curbs with SBA with one at a time and a handrail Goal status: INITIAL  5.  Report able to warm up a meal without sitting Goal status: INITIAL   PLAN:  PT FREQUENCY: 1-2x/week  PT DURATION: 12 weeks  PLANNED INTERVENTIONS: Therapeutic exercises, Therapeutic  activity, Neuromuscular re-education, Balance training, Gait training, Patient/Family education, Self Care, Joint mobilization, and Manual therapy  PLAN FOR NEXT SESSION: add exercises, balance   Scarlet Abad W, PT 11/17/2022, 4:09 PM

## 2022-11-18 DIAGNOSIS — S46002A Unspecified injury of muscle(s) and tendon(s) of the rotator cuff of left shoulder, initial encounter: Secondary | ICD-10-CM | POA: Diagnosis not present

## 2022-11-26 ENCOUNTER — Ambulatory Visit: Payer: Medicare Other

## 2022-11-26 DIAGNOSIS — M6281 Muscle weakness (generalized): Secondary | ICD-10-CM | POA: Diagnosis not present

## 2022-11-26 DIAGNOSIS — R262 Difficulty in walking, not elsewhere classified: Secondary | ICD-10-CM | POA: Diagnosis not present

## 2022-11-26 DIAGNOSIS — R2681 Unsteadiness on feet: Secondary | ICD-10-CM | POA: Diagnosis not present

## 2022-11-26 NOTE — Therapy (Signed)
OUTPATIENT PHYSICAL THERAPY LOWER EXTREMITY TREATMENT   Patient Name: Pamela Snyder MRN: [id number] DOB:Jan 31, 1946, 77 y.o., female Today's Date: 11/26/2022  END OF SESSION:  PT End of Session - 11/26/22 1658     Visit Number 2    Date for PT Re-Evaluation 02/17/23    Authorization Type Medicare    PT Start Time 1658    PT Stop Time 1745    PT Time Calculation (min) 47 min    Activity Tolerance Patient tolerated treatment well    Behavior During Therapy WFL for tasks assessed/performed             Past Medical History:  Diagnosis Date   Anxiety    Arthritis    "right foot; spine; hands" (11/23/2014)   Charcot foot due to diabetes mellitus (HCC)    Depression    GERD (gastroesophageal reflux disease)    Gout    Hyperlipemia    Hypertension    Migraine    hx   Neuropathy    Numbness    Thyroid goiter 1986   Type II diabetes mellitus (HCC)    Past Surgical History:  Procedure Laterality Date   ABDOMINAL HYSTERECTOMY  1999   w/BSO   ACHILLES TENDON LENGTHENING Right 11/23/2014   ACHILLES TENDON LENGTHENING Right 11/23/2014   Procedure: RIGHT ACHILLES PERCUTANEOUS TENDON LENGTHENING;  Surgeon: Toni Arthurs, MD;  Location: [facility];  Service: Orthopedics;  Laterality: Right;   APPENDECTOMY  1963   ARTHRODESIS Right 11/23/2014   mid foot   CARDIAC CATHETERIZATION  08/2004   FOOT ARTHRODESIS Right 11/23/2014   Procedure: RIGHT MID FOOT ARTHRODESIS;  Surgeon: Toni Arthurs, MD;  Location: [facility];  Service: Orthopedics;  Laterality: Right;   METATARSAL OSTEOTOMY Right 11/23/2014   Procedure: RIGHT MID FOOT OSTEOTOMY;  Surgeon: Toni Arthurs, MD;  Location: [facility];  Service: Orthopedics;  Laterality: Right;   ORIF ANKLE FRACTURE Left 05/16/2021   Procedure: OPEN REDUCTION INTERNAL FIXATION (ORIF) Left ankle lateral malleolus, possible deltoid ligament repair;  Surgeon: Toni Arthurs, MD;  Location: [facility];  Service: Orthopedics;  Laterality: Left;   OSTEOTOMY Right 11/23/2014    mid foot   PARTIAL THYMECTOMY  1986   ? side   TONSILLECTOMY  1954   Patient Active Problem List   Diagnosis Date Noted   Closed low lateral malleolus fracture 05/16/2021   Gout 05/16/2021   Hyperlipemia 05/16/2021   Hypertension 05/16/2021   Type II diabetes mellitus (HCC) 05/16/2021   Spinal stenosis of lumbar region 03/10/2019   Cervical myelopathy (HCC) 03/10/2019   Peripheral neuropathy 02/02/2019   Gait abnormality 02/02/2019   Urinary urgency 02/02/2019   Chronic bilateral low back pain without sciatica 02/02/2019   DOE (dyspnea on exertion) 10/05/2017   Bruit of right carotid artery 10/05/2017   Coronary artery calcification seen on CT scan 10/05/2017   Chest pain 10/04/2017   Aortic atherosclerosis (HCC) 10/04/2017   Charcot foot due to diabetes mellitus (HCC) 11/23/2014    PCP: Jarrett Soho, PA  REFERRING PROVIDER: Delena Serve, PA  REFERRING DIAG: poor balance, weakness, difficulty walking  THERAPY DIAG:  Muscle weakness (generalized)  Unsteady gait  Difficulty in walking, not elsewhere classified  Rationale for Evaluation and Treatment: Rehabilitation  ONSET DATE: 11/03/22  SUBJECTIVE:   SUBJECTIVE STATEMENT: Doing fine, tired. I had a busy day yesterday and I did not sleep well last night.   PERTINENT HISTORY: See above PAIN:  Are you having pain? Yes: NPRS scale: 4/10 Pain location: low back  Pain description: tight, stabbing Aggravating factors: being on feet longer, twisting pain pain up to 6/10 Relieving factors: rest pain will go to 0/10  PRECAUTIONS: Fall  WEIGHT BEARING RESTRICTIONS: No  FALLS:  Has patient fallen in last 6 months? No, just unsteady  LIVING ENVIRONMENT: Lives with: lives with their family Lives in: House/apartment Stairs: Yes: Internal: 12 steps; can reach both Has following equipment at home: Dan Humphreys - 2 wheeled  OCCUPATION: retired  PLOF: Independent with household mobility with device and Needs assistance  with homemaking  PATIENT GOALS: may go to the beach in the fall, attend women's group, feel stronger, more steady, stay living at home independently  NEXT MD VISIT: none scheduled  OBJECTIVE:   DIAGNOSTIC FINDINGS: none  COGNITION: Overall cognitive status: Within functional limits for tasks assessed     SENSATION: Mid shin down very limited sensation light touch can feel some deeper touch  EDEMA:  Very mild at the ankles  MUSCLE LENGTH: Mild tightness in the HS  POSTURE: rounded shoulders and forward head  PALPATION: Tight and tender in the lumbar paraspinals  LOWER EXTREMITY ROM:  Active ROM Right eval Left eval  Hip flexion 80 80  Hip extension    Hip abduction    Hip adduction    Hip internal rotation    Hip external rotation    Knee flexion    Knee extension 0 0  Ankle dorsiflexion 0 0  Ankle plantarflexion    Ankle inversion    Ankle eversion     (Blank rows = not tested)  LOWER EXTREMITY MMT:  MMT Right eval Left eval  Hip flexion 4 4-  Hip extension 4- 4-  Hip abduction 4 4-  Hip adduction    Hip internal rotation    Hip external rotation    Knee flexion 4 4-  Knee extension 4 3+  Ankle dorsiflexion 2 0  Ankle plantarflexion 1 1  Ankle inversion 1 0  Ankle eversion 0 0   (Blank rows = not tested) FUNCTIONAL TESTS:  TUG: 40 seconds with FWW at evaluation walk test with CGA and FWW = 200 feet at evaluation Unable to stand without holding onto something  GAIT: Distance walked: 200 feet Assistive device utilized: Environmental consultant - 2 wheeled Level of assistance: SBA to start and then needs CGA Comments: foot drop bilaterally, decreased foot control, some hip drop on the left side, when fatigued poor safety with transfers   TODAY'S TREATMENT:                                                                                                                              DATE:  11/26/22 Walk half way around [address] and back through  shortcut  NuStep L5x73mins Leg press 20# 2x10  Bridges x10 Trunk rotations x10 STM to R low back with massage gun 5 mins Heel taps at stairs 4" 20 reps alt  Going up stairs 1x, alt with  2HHA on rails Volleyball hits seated    PATIENT EDUCATION:  Education details: POC Person educated: Patient Education method: Explanation Education comprehension: verbalized understanding  HOME EXERCISE PROGRAM: TBD  ASSESSMENT:  CLINICAL IMPRESSION: Patient was seen today for physical therapy evaluation and treatment for weakness, unsteady gait, poor balance. We focused on mostly walking outdoors and some LE strengthening. Incorporated some exercises for her low back as she reports pain. She has minimal movements at the ankles and minimal sensation to the ankles due to the neuropathy. This throws her balance off so cannot do many standing exercises without holding on to something for support.    OBJECTIVE IMPAIRMENTS: Abnormal gait, cardiopulmonary status limiting activity, decreased activity tolerance, decreased balance, decreased coordination, decreased endurance, decreased mobility, difficulty walking, decreased ROM, decreased strength, impaired flexibility, impaired sensation, and pain.   REHAB POTENTIAL: Good  CLINICAL DECISION MAKING: Stable/uncomplicated  EVALUATION COMPLEXITY: Low   GOALS: Goals reviewed with patient? Yes  SHORT TERM GOALS: Target date: 12/02/22 Independent with initial HEP Goal status: INITIAL  LONG TERM GOALS: Target date: 02/17/23  Independent with advanced HEP or gym program Goal status: INITIAL  2.  Decrease TUG time to 24 seconds Goal status: INITIAL  3.  Walk 300 feet in 3 minute walk test Goal status: INITIAL  4.  Be able to negotiate stairs and curbs with SBA with one at a time and a handrail Goal status: INITIAL  5.  Report able to warm up a meal without sitting Goal status: INITIAL   PLAN:  PT FREQUENCY: 1-2x/week  PT DURATION: 12  weeks  PLANNED INTERVENTIONS: Therapeutic exercises, Therapeutic activity, Neuromuscular re-education, Balance training, Gait training, Patient/Family education, Self Care, Joint mobilization, and Manual therapy  PLAN FOR NEXT SESSION: add exercises, balance   Cassie Freer, PT 11/26/2022, 5:45 PM

## 2022-11-27 ENCOUNTER — Ambulatory Visit: Payer: Medicare Other | Admitting: Physical Therapy

## 2022-11-27 DIAGNOSIS — R262 Difficulty in walking, not elsewhere classified: Secondary | ICD-10-CM

## 2022-11-27 DIAGNOSIS — M6281 Muscle weakness (generalized): Secondary | ICD-10-CM

## 2022-11-27 DIAGNOSIS — R2681 Unsteadiness on feet: Secondary | ICD-10-CM

## 2022-11-27 NOTE — Therapy (Signed)
OUTPATIENT PHYSICAL THERAPY LOWER EXTREMITY TREATMENT   Patient Name: Pamela Snyder MRN: [id number] DOB:1946-03-20, 77 y.o., female Today's Date: 11/27/2022  END OF SESSION:  PT End of Session - 11/27/22 1704     Visit Number 3    Date for PT Re-Evaluation 02/17/23    Authorization Type Medicare    PT Start Time 1702    PT Stop Time 1750    PT Time Calculation (min) 48 min             Past Medical History:  Diagnosis Date   Anxiety    Arthritis    "right foot; spine; hands" (11/23/2014)   Charcot foot due to diabetes mellitus (HCC)    Depression    GERD (gastroesophageal reflux disease)    Gout    Hyperlipemia    Hypertension    Migraine    hx   Neuropathy    Numbness    Thyroid goiter 1986   Type II diabetes mellitus (HCC)    Past Surgical History:  Procedure Laterality Date   ABDOMINAL HYSTERECTOMY  1999   w/BSO   ACHILLES TENDON LENGTHENING Right 11/23/2014   ACHILLES TENDON LENGTHENING Right 11/23/2014   Procedure: RIGHT ACHILLES PERCUTANEOUS TENDON LENGTHENING;  Surgeon: Toni Arthurs, MD;  Location: [facility];  Service: Orthopedics;  Laterality: Right;   APPENDECTOMY  1963   ARTHRODESIS Right 11/23/2014   mid foot   CARDIAC CATHETERIZATION  08/2004   FOOT ARTHRODESIS Right 11/23/2014   Procedure: RIGHT MID FOOT ARTHRODESIS;  Surgeon: Toni Arthurs, MD;  Location: [facility];  Service: Orthopedics;  Laterality: Right;   METATARSAL OSTEOTOMY Right 11/23/2014   Procedure: RIGHT MID FOOT OSTEOTOMY;  Surgeon: Toni Arthurs, MD;  Location: [facility];  Service: Orthopedics;  Laterality: Right;   ORIF ANKLE FRACTURE Left 05/16/2021   Procedure: OPEN REDUCTION INTERNAL FIXATION (ORIF) Left ankle lateral malleolus, possible deltoid ligament repair;  Surgeon: Toni Arthurs, MD;  Location: [facility];  Service: Orthopedics;  Laterality: Left;   OSTEOTOMY Right 11/23/2014   mid foot   PARTIAL THYMECTOMY  1986   ? side   TONSILLECTOMY  1954   Patient Active Problem List   Diagnosis Date  Noted   Closed low lateral malleolus fracture 05/16/2021   Gout 05/16/2021   Hyperlipemia 05/16/2021   Hypertension 05/16/2021   Type II diabetes mellitus (HCC) 05/16/2021   Spinal stenosis of lumbar region 03/10/2019   Cervical myelopathy (HCC) 03/10/2019   Peripheral neuropathy 02/02/2019   Gait abnormality 02/02/2019   Urinary urgency 02/02/2019   Chronic bilateral low back pain without sciatica 02/02/2019   DOE (dyspnea on exertion) 10/05/2017   Bruit of right carotid artery 10/05/2017   Coronary artery calcification seen on CT scan 10/05/2017   Chest pain 10/04/2017   Aortic atherosclerosis (HCC) 10/04/2017   Charcot foot due to diabetes mellitus (HCC) 11/23/2014    PCP: Jarrett Soho, PA  REFERRING PROVIDER: Delena Serve, PA  REFERRING DIAG: poor balance, weakness, difficulty walking  THERAPY DIAG:  Muscle weakness (generalized)  Unsteady gait  Difficulty in walking, not elsewhere classified  Rationale for Evaluation and Treatment: Rehabilitation  ONSET DATE: 11/03/22  SUBJECTIVE:    SUBJECTIVE STATEMENT:  " Pamela Snyder kicked my butt yesterday, I need that" PERTINENT HISTORY: See above PAIN:  Are you having pain? Yes: NPRS scale: 4/10 Pain location: low back Pain description: tight, stabbing Aggravating factors: being on feet longer, twisting pain pain up to 6/10 Relieving factors: rest pain will go to 0/10  PRECAUTIONS: Fall  WEIGHT BEARING RESTRICTIONS: No  FALLS:  Has patient fallen in last 6 months? No, just unsteady  LIVING ENVIRONMENT: Lives with: lives with their family Lives in: House/apartment Stairs: Yes: Internal: 12 steps; can reach both Has following equipment at home: Dan Humphreys - 2 wheeled  OCCUPATION: retired  PLOF: Independent with household mobility with device and Needs assistance with homemaking  PATIENT GOALS: may go to the beach in the fall, attend women's group, feel stronger, more steady, stay living at home independently  NEXT MD  VISIT: none scheduled  OBJECTIVE:   DIAGNOSTIC FINDINGS: none  COGNITION: Overall cognitive status: Within functional limits for tasks assessed     SENSATION: Mid shin down very limited sensation light touch can feel some deeper touch  EDEMA:  Very mild at the ankles  MUSCLE LENGTH: Mild tightness in the HS  POSTURE: rounded shoulders and forward head  PALPATION: Tight and tender in the lumbar paraspinals  LOWER EXTREMITY ROM:  Active ROM Right eval Left eval  Hip flexion 80 80  Hip extension    Hip abduction    Hip adduction    Hip internal rotation    Hip external rotation    Knee flexion    Knee extension 0 0  Ankle dorsiflexion 0 0  Ankle plantarflexion    Ankle inversion    Ankle eversion     (Blank rows = not tested)  LOWER EXTREMITY MMT:  MMT Right eval Left eval  Hip flexion 4 4-  Hip extension 4- 4-  Hip abduction 4 4-  Hip adduction    Hip internal rotation    Hip external rotation    Knee flexion 4 4-  Knee extension 4 3+  Ankle dorsiflexion 2 0  Ankle plantarflexion 1 1  Ankle inversion 1 0  Ankle eversion 0 0   (Blank rows = not tested) FUNCTIONAL TESTS:  TUG: 40 seconds with FWW at evaluation walk test with CGA and FWW = 200 feet at evaluation Unable to stand without holding onto something  GAIT: Distance walked: 200 feet Assistive device utilized: Walker - 2 wheeled Level of assistance: SBA to start and then needs CGA Comments: foot drop bilaterally, decreased foot control, some hip drop on the left side, when fatigued poor safety with transfers   TODAY'S TREATMENT:                                                                                                                              DATE:   11/27/22 Nustep L 5 ( with short burst of increased speed) STS with HHA working on increased wt thru LE vs heavy arm use,  mat 3 sets 5 Ball tap standing mod-max A to stab HHA side stepping 10 feet 2 x each min A HHA  walking fwd and back 10 feet 2 x each min-mod A HHA stepping over 1 in fwd BIL mod A and side min A 10 x Walk half  way around [address] and back through shortcut - min A navigating walker up and down curb HS curl 25# 2 sets 10 Knee ext 2 sets 10 15# Lat PUll Down 2 sets 10  25#     11/26/22 Walk half way around [address] and back through shortcut  NuStep L5x53mins Leg press 20# 2x10  Bridges x10 Trunk rotations x10 STM to R low back with massage gun 5 mins Heel taps at stairs 4" 20 reps alt  Going up stairs 1x, alt with 2HHA on rails Volleyball hits seated    PATIENT EDUCATION:  Education details: POC Person educated: Patient Education method: Explanation Education comprehension: verbalized understanding  HOME EXERCISE PROGRAM: TBD  ASSESSMENT:  CLINICAL IMPRESSION: Pt really struggles with standing activities without heavy UE use- see above for assistance needed with varies balance activities  OBJECTIVE IMPAIRMENTS: Abnormal gait, cardiopulmonary status limiting activity, decreased activity tolerance, decreased balance, decreased coordination, decreased endurance, decreased mobility, difficulty walking, decreased ROM, decreased strength, impaired flexibility, impaired sensation, and pain.   REHAB POTENTIAL: Good  CLINICAL DECISION MAKING: Stable/uncomplicated  EVALUATION COMPLEXITY: Low   GOALS: Goals reviewed with patient? Yes  SHORT TERM GOALS: Target date: 12/02/22 Independent with initial HEP Goal status: INITIAL  LONG TERM GOALS: Target date: 02/17/23  Independent with advanced HEP or gym program Goal status: INITIAL  2.  Decrease TUG time to 24 seconds Goal status: INITIAL  3.  Walk 300 feet in 3 minute walk test Goal status: INITIAL  4.  Be able to negotiate stairs and curbs with SBA with one at a time and a handrail Goal status: INITIAL  5.  Report able to warm up a meal without sitting Goal status: INITIAL   PLAN:  PT FREQUENCY:  1-2x/week  PT DURATION: 12 weeks  PLANNED INTERVENTIONS: Therapeutic exercises, Therapeutic activity, Neuromuscular re-education, Balance training, Gait training, Patient/Family education, Self Care, Joint mobilization, and Manual therapy  PLAN FOR NEXT SESSION: add exercises, balance   Erian Lariviere,ANGIE, PTA 11/27/2022, 5:05 PM [facility] at [facility] [address] [facility]. [place], [place], [address] Phone: [phone]   Fax:  [phone]  Patient Details  Name: Pamela Snyder MRN: [id number] Date of Birth: [birth year] Referring Provider:  Jarrett Soho, PA-C  Encounter Date: 11/27/2022   Suanne Marker, PTA 11/27/2022, 5:05 PM  [place] [facility] at [facility] [address] [facility] [place], [place], [address] Phone: [phone]   Fax:  [phone]

## 2022-12-02 NOTE — Therapy (Signed)
OUTPATIENT PHYSICAL THERAPY LOWER EXTREMITY TREATMENT   Patient Name: Pamela Snyder MRN: [id number] DOB:October 03, 1945, 77 y.o., female Today's Date: 11/27/2022  END OF SESSION:  PT End of Session - 11/27/22 1704     Visit Number 3    Date for PT Re-Evaluation 02/17/23    Authorization Type Medicare    PT Start Time 1702    PT Stop Time 1750    PT Time Calculation (min) 48 min             Past Medical History:  Diagnosis Date   Anxiety    Arthritis    "right foot; spine; hands" (11/23/2014)   Charcot foot due to diabetes mellitus (HCC)    Depression    GERD (gastroesophageal reflux disease)    Gout    Hyperlipemia    Hypertension    Migraine    hx   Neuropathy    Numbness    Thyroid goiter 1986   Type II diabetes mellitus (HCC)    Past Surgical History:  Procedure Laterality Date   ABDOMINAL HYSTERECTOMY  1999   w/BSO   ACHILLES TENDON LENGTHENING Right 11/23/2014   ACHILLES TENDON LENGTHENING Right 11/23/2014   Procedure: RIGHT ACHILLES PERCUTANEOUS TENDON LENGTHENING;  Surgeon: Toni Arthurs, MD;  [facility]: [facility];  Service: Orthopedics;  Laterality: Right;   APPENDECTOMY  1963   ARTHRODESIS Right 11/23/2014   mid foot   CARDIAC CATHETERIZATION  08/2004   FOOT ARTHRODESIS Right 11/23/2014   Procedure: RIGHT MID FOOT ARTHRODESIS;  Surgeon: Toni Arthurs, MD;  Location: [facility];  Service: Orthopedics;  Laterality: Right;   METATARSAL OSTEOTOMY Right 11/23/2014   Procedure: RIGHT MID FOOT OSTEOTOMY;  Surgeon: Toni Arthurs, MD;  Location: [facility];  Service: Orthopedics;  Laterality: Right;   ORIF ANKLE FRACTURE Left 05/16/2021   Procedure: OPEN REDUCTION INTERNAL FIXATION (ORIF) Left ankle lateral malleolus, possible deltoid ligament repair;  Surgeon: Toni Arthurs, MD;  Location: [facility];  Service: Orthopedics;  Laterality: Left;   OSTEOTOMY Right 11/23/2014   mid foot   PARTIAL THYMECTOMY  1986   ? side   TONSILLECTOMY  1954   Patient Active Problem List   Diagnosis Date  Noted   Closed low lateral malleolus fracture 05/16/2021   Gout 05/16/2021   Hyperlipemia 05/16/2021   Hypertension 05/16/2021   Type II diabetes mellitus (HCC) 05/16/2021   Spinal stenosis of lumbar region 03/10/2019   Cervical myelopathy (HCC) 03/10/2019   Peripheral neuropathy 02/02/2019   Gait abnormality 02/02/2019   Urinary urgency 02/02/2019   Chronic bilateral low back pain without sciatica 02/02/2019   DOE (dyspnea on exertion) 10/05/2017   Bruit of right carotid artery 10/05/2017   Coronary artery calcification seen on CT scan 10/05/2017   Chest pain 10/04/2017   Aortic atherosclerosis (HCC) 10/04/2017   Charcot foot due to diabetes mellitus (HCC) 11/23/2014    PCP: Jarrett Soho, PA  REFERRING PROVIDER: Delena Serve, PA  REFERRING DIAG: poor balance, weakness, difficulty walking  THERAPY DIAG:  Muscle weakness (generalized)  Unsteady gait  Difficulty in walking, not elsewhere classified  Rationale for Evaluation and Treatment: Rehabilitation  ONSET DATE: 11/03/22  SUBJECTIVE:    SUBJECTIVE STATEMENT:  " [name] kicked my butt yesterday, I need that" PERTINENT HISTORY: See above PAIN:  Are you having pain? Yes: NPRS scale: 4/10 Pain location: low back Pain description: tight, stabbing Aggravating factors: being on feet longer, twisting pain pain up to 6/10 Relieving factors: rest pain will go to 0/10  PRECAUTIONS: Fall  WEIGHT BEARING RESTRICTIONS: No  FALLS:  Has patient fallen in last 6 months? No, just unsteady  LIVING ENVIRONMENT: Lives with: lives with their family Lives in: House/apartment Stairs: Yes: Internal: 12 steps; can reach both Has following equipment at home: Dan Humphreys - 2 wheeled  OCCUPATION: retired  PLOF: Independent with household mobility with device and Needs assistance with homemaking  PATIENT GOALS: may go to the beach in the fall, attend women's group, feel stronger, more steady, stay living at home independently  NEXT MD  VISIT: none scheduled  OBJECTIVE:   DIAGNOSTIC FINDINGS: none  COGNITION: Overall cognitive status: Within functional limits for tasks assessed     SENSATION: Mid shin down very limited sensation light touch can feel some deeper touch  EDEMA:  Very mild at the ankles  MUSCLE LENGTH: Mild tightness in the HS  POSTURE: rounded shoulders and forward head  PALPATION: Tight and tender in the lumbar paraspinals  LOWER EXTREMITY ROM:  Active ROM Right eval Left eval  Hip flexion 80 80  Hip extension    Hip abduction    Hip adduction    Hip internal rotation    Hip external rotation    Knee flexion    Knee extension 0 0  Ankle dorsiflexion 0 0  Ankle plantarflexion    Ankle inversion    Ankle eversion     (Blank rows = not tested)  LOWER EXTREMITY MMT:  MMT Right eval Left eval  Hip flexion 4 4-  Hip extension 4- 4-  Hip abduction 4 4-  Hip adduction    Hip internal rotation    Hip external rotation    Knee flexion 4 4-  Knee extension 4 3+  Ankle dorsiflexion 2 0  Ankle plantarflexion 1 1  Ankle inversion 1 0  Ankle eversion 0 0   (Blank rows = not tested) FUNCTIONAL TESTS:  TUG: 40 seconds with FWW at evaluation walk test with CGA and FWW = 200 feet at evaluation Unable to stand without holding onto something  GAIT: Distance walked: 200 feet Assistive device utilized: Environmental consultant - 2 wheeled Level of assistance: SBA to start and then needs CGA Comments: foot drop bilaterally, decreased foot control, some hip drop on the left side, when fatigued poor safety with transfers   TODAY'S TREATMENT:                                                                                                                              DATE:  12/03/22 NuStep Standing flexion 2#  Standing hip abd 2#  Modified sit ups  STS  Walking  Leg press    11/27/22 Nustep L 5 ( with short burst of increased speed) STS with HHA working on increased wt thru LE vs  heavy arm use,  mat 3 sets 5 Ball tap standing mod-max A to stab HHA side stepping 10 feet 2 x each min A HHA walking fwd and back 10  feet 2 x each min-mod A HHA stepping over 1 in fwd BIL mod A and side min A 10 x Walk half way around [address] and back through shortcut - min A navigating walker up and down curb HS curl 25# 2 sets 10 Knee ext 2 sets 10 15# Lat PUll Down 2 sets 10  25#   11/26/22 Walk half way around [address] and back through shortcut  NuStep L5x25mins Leg press 20# 2x10  Bridges x10 Trunk rotations x10 STM to R low back with massage gun 5 mins Heel taps at stairs 4" 20 reps alt  Going up stairs 1x, alt with 2HHA on rails Volleyball hits seated    PATIENT EDUCATION:  Education details: POC Person educated: Patient Education method: Explanation Education comprehension: verbalized understanding  HOME EXERCISE PROGRAM: TBD  ASSESSMENT:  CLINICAL IMPRESSION: Pt really struggles with standing activities without heavy UE use- see above for assistance needed with varies balance activities  OBJECTIVE IMPAIRMENTS: Abnormal gait, cardiopulmonary status limiting activity, decreased activity tolerance, decreased balance, decreased coordination, decreased endurance, decreased mobility, difficulty walking, decreased ROM, decreased strength, impaired flexibility, impaired sensation, and pain.   REHAB POTENTIAL: Good  CLINICAL DECISION MAKING: Stable/uncomplicated  EVALUATION COMPLEXITY: Low   GOALS: Goals reviewed with patient? Yes  SHORT TERM GOALS: Target date: 12/02/22 Independent with initial HEP Goal status: INITIAL  LONG TERM GOALS: Target date: 02/17/23  Independent with advanced HEP or gym program Goal status: INITIAL  2.  Decrease TUG time to 24 seconds Goal status: INITIAL  3.  Walk 300 feet in 3 minute walk test Goal status: INITIAL  4.  Be able to negotiate stairs and curbs with SBA with one at a time and a handrail Goal status:  INITIAL  5.  Report able to warm up a meal without sitting Goal status: INITIAL   PLAN:  PT FREQUENCY: 1-2x/week  PT DURATION: 12 weeks  PLANNED INTERVENTIONS: Therapeutic exercises, Therapeutic activity, Neuromuscular re-education, Balance training, Gait training, Patient/Family education, Self Care, Joint mobilization, and Manual therapy  PLAN FOR NEXT SESSION: add exercises, balance   PAYSEUR,ANGIE, PTA 11/27/2022, 5:05 PM [place] [facility] at [facility] [address] [facility]. [place], [place], [address] Phone: [phone]   Fax:  [phone]  Patient Details  Name: Roslynn Becks MRN: [id number] Date of Birth: 07-24-1945 Referring Provider:  Jarrett Soho, PA-C  Encounter Date: 11/27/2022   Suanne Marker, PTA 11/27/2022, 5:05 PM  [place] [facility] at [facility] [address] [facility] [place], [place], [address] Phone: [phone]   Fax:  [phone]

## 2022-12-03 ENCOUNTER — Ambulatory Visit: Payer: Medicare Other

## 2022-12-03 DIAGNOSIS — R262 Difficulty in walking, not elsewhere classified: Secondary | ICD-10-CM

## 2022-12-03 DIAGNOSIS — M6281 Muscle weakness (generalized): Secondary | ICD-10-CM | POA: Diagnosis not present

## 2022-12-03 DIAGNOSIS — R2681 Unsteadiness on feet: Secondary | ICD-10-CM | POA: Diagnosis not present

## 2022-12-04 ENCOUNTER — Encounter: Payer: Self-pay | Admitting: Physical Therapy

## 2022-12-04 ENCOUNTER — Ambulatory Visit: Payer: Medicare Other | Admitting: Physical Therapy

## 2022-12-04 DIAGNOSIS — R262 Difficulty in walking, not elsewhere classified: Secondary | ICD-10-CM | POA: Diagnosis not present

## 2022-12-04 DIAGNOSIS — M6281 Muscle weakness (generalized): Secondary | ICD-10-CM | POA: Diagnosis not present

## 2022-12-04 DIAGNOSIS — R2681 Unsteadiness on feet: Secondary | ICD-10-CM

## 2022-12-04 NOTE — Therapy (Signed)
OUTPATIENT PHYSICAL THERAPY LOWER EXTREMITY TREATMENT   Patient Name: Pamela Snyder MRN: [id number] DOB:09-15-1945, 77 y.o., female Today's Date: 12/04/2022  END OF SESSION:  PT End of Session - 12/04/22 1815     Visit Number 5    Date for PT Re-Evaluation 02/17/23    Authorization Type Medicare    PT Start Time 1742    PT Stop Time 1828    PT Time Calculation (min) 46 min    Activity Tolerance Patient tolerated treatment well    Behavior During Therapy WFL for tasks assessed/performed             Past Medical History:  Diagnosis Date   Anxiety    Arthritis    "right foot; spine; hands" (11/23/2014)   Charcot foot due to diabetes mellitus (HCC)    Depression    GERD (gastroesophageal reflux disease)    Gout    Hyperlipemia    Hypertension    Migraine    hx   Neuropathy    Numbness    Thyroid goiter 1986   Type II diabetes mellitus (HCC)    Past Surgical History:  Procedure Laterality Date   ABDOMINAL HYSTERECTOMY  1999   w/BSO   ACHILLES TENDON LENGTHENING Right 11/23/2014   ACHILLES TENDON LENGTHENING Right 11/23/2014   Procedure: RIGHT ACHILLES PERCUTANEOUS TENDON LENGTHENING;  Surgeon: Toni Arthurs, MD;  Location: [facility];  Service: Orthopedics;  Laterality: Right;   APPENDECTOMY  1963   ARTHRODESIS Right 11/23/2014   mid foot   CARDIAC CATHETERIZATION  08/2004   FOOT ARTHRODESIS Right 11/23/2014   Procedure: RIGHT MID FOOT ARTHRODESIS;  Surgeon: Toni Arthurs, MD;  Location: [facility];  Service: Orthopedics;  Laterality: Right;   METATARSAL OSTEOTOMY Right 11/23/2014   Procedure: RIGHT MID FOOT OSTEOTOMY;  Surgeon: Toni Arthurs, MD;  Location: [facility];  Service: Orthopedics;  Laterality: Right;   ORIF ANKLE FRACTURE Left 05/16/2021   Procedure: OPEN REDUCTION INTERNAL FIXATION (ORIF) Left ankle lateral malleolus, possible deltoid ligament repair;  Surgeon: Toni Arthurs, MD;  Location: [facility];  Service: Orthopedics;  Laterality: Left;   OSTEOTOMY Right 11/23/2014    mid foot   PARTIAL THYMECTOMY  1986   ? side   TONSILLECTOMY  1954   Patient Active Problem List   Diagnosis Date Noted   Closed low lateral malleolus fracture 05/16/2021   Gout 05/16/2021   Hyperlipemia 05/16/2021   Hypertension 05/16/2021   Type II diabetes mellitus (HCC) 05/16/2021   Spinal stenosis of lumbar region 03/10/2019   Cervical myelopathy (HCC) 03/10/2019   Peripheral neuropathy 02/02/2019   Gait abnormality 02/02/2019   Urinary urgency 02/02/2019   Chronic bilateral low back pain without sciatica 02/02/2019   DOE (dyspnea on exertion) 10/05/2017   Bruit of right carotid artery 10/05/2017   Coronary artery calcification seen on CT scan 10/05/2017   Chest pain 10/04/2017   Aortic atherosclerosis (HCC) 10/04/2017   Charcot foot due to diabetes mellitus (HCC) 11/23/2014    PCP: Jarrett Soho, PA  REFERRING PROVIDER: Delena Serve, PA  REFERRING DIAG: poor balance, weakness, difficulty walking  THERAPY DIAG:  Muscle weakness (generalized)  Unsteady gait  Difficulty in walking, not elsewhere classified  Rationale for Evaluation and Treatment: Rehabilitation  ONSET DATE: 11/03/22  SUBJECTIVE:    SUBJECTIVE STATEMENT:  I would like to walk today  PERTINENT HISTORY: See above PAIN:  Are you having pain? Yes: NPRS scale: 4/10 Pain location: low back Pain description: tight, stabbing Aggravating factors: being on feet longer,  twisting pain pain up to 6/10 Relieving factors: rest pain will go to 0/10  PRECAUTIONS: Fall  WEIGHT BEARING RESTRICTIONS: No  FALLS:  Has patient fallen in last 6 months? No, just unsteady  LIVING ENVIRONMENT: Lives with: lives with their family Lives in: House/apartment Stairs: Yes: Internal: 12 steps; can reach both Has following equipment at home: Dan Humphreys - 2 wheeled  OCCUPATION: retired  PLOF: Independent with household mobility with device and Needs assistance with homemaking  PATIENT GOALS: may go to the beach in  the fall, attend women's group, feel stronger, more steady, stay living at home independently  NEXT MD VISIT: none scheduled  OBJECTIVE:   DIAGNOSTIC FINDINGS: none  COGNITION: Overall cognitive status: Within functional limits for tasks assessed     SENSATION: Mid shin down very limited sensation light touch can feel some deeper touch  EDEMA:  Very mild at the ankles  MUSCLE LENGTH: Mild tightness in the HS  POSTURE: rounded shoulders and forward head  PALPATION: Tight and tender in the lumbar paraspinals  LOWER EXTREMITY ROM:  Active ROM Right eval Left eval  Hip flexion 80 80  Hip extension    Hip abduction    Hip adduction    Hip internal rotation    Hip external rotation    Knee flexion    Knee extension 0 0  Ankle dorsiflexion 0 0  Ankle plantarflexion    Ankle inversion    Ankle eversion     (Blank rows = not tested)  LOWER EXTREMITY MMT:  MMT Right eval Left eval  Hip flexion 4 4-  Hip extension 4- 4-  Hip abduction 4 4-  Hip adduction    Hip internal rotation    Hip external rotation    Knee flexion 4 4-  Knee extension 4 3+  Ankle dorsiflexion 2 0  Ankle plantarflexion 1 1  Ankle inversion 1 0  Ankle eversion 0 0   (Blank rows = not tested) FUNCTIONAL TESTS:  TUG: 40 seconds with FWW at evaluation walk test with CGA and FWW = 200 feet at evaluation Unable to stand without holding onto something  GAIT: Distance walked: 200 feet Assistive device utilized: Environmental consultant - 2 wheeled Level of assistance: SBA to start and then needs CGA Comments: foot drop bilaterally, decreased foot control, some hip drop on the left side, when fatigued poor safety with transfers   TODAY'S TREATMENT:                                                                                                                              DATE:  12/04/22 Gait outside around 1/2 the parking [place] then sit and rest and then from there around to the front of the  building Volley ball sitting Supine bridges Feet on ball small trunk rotation Isometric abs Hooklying clamshells green tband Supine green tband extension Isometric abs Getting up from supine had back pain, used theragun on lumbar pspinals Side  stepping  12/03/22 NuStep L5x32mins  Standing flexion 2.5# 2x20 reps alt  Standing hip abd 2.5# 2x10  STS 2x5 from elevated mat table Modified sit ups from wedge holding yellow ball 2x10  Fitter pushes 1 black band 2x10 Leg press 40# 2x10   11/27/22 Nustep L 5 ( with short burst of increased speed) STS with HHA working on increased wt thru LE vs heavy arm use,  mat 3 sets 5 Ball tap standing mod-max A to stab HHA side stepping 10 feet 2 x each min A HHA walking fwd and back 10 feet 2 x each min-mod A HHA stepping over 1 in fwd BIL mod A and side min A 10 x Walk half way around [address] and back through shortcut - min A navigating walker up and down curb HS curl 25# 2 sets 10 Knee ext 2 sets 10 15# Lat PUll Down 2 sets 10  25#   11/26/22 Walk half way around [address] and back through shortcut  NuStep L5x13mins Leg press 20# 2x10  Bridges x10 Trunk rotations x10 STM to R low back with massage gun 5 mins Heel taps at stairs 4" 20 reps alt  Going up stairs 1x, alt with 2HHA on rails Volleyball hits seated    PATIENT EDUCATION:  Education details: POC Person educated: Patient Education method: Explanation Education comprehension: verbalized understanding  HOME EXERCISE PROGRAM: TBD  ASSESSMENT:  CLINICAL IMPRESSION: Did a large walk, she tolerated well needs CGA, going up curbs needs mod A, down curbs needs min A.  We did supine exercises and she did very well but when getting up she had some back pain and spasms, may need to look at her body mechanics for this  OBJECTIVE IMPAIRMENTS: Abnormal gait, cardiopulmonary status limiting activity, decreased activity tolerance, decreased balance, decreased coordination,  decreased endurance, decreased mobility, difficulty walking, decreased ROM, decreased strength, impaired flexibility, impaired sensation, and pain.   REHAB POTENTIAL: Good  CLINICAL DECISION MAKING: Stable/uncomplicated  EVALUATION COMPLEXITY: Low   GOALS: Goals reviewed with patient? Yes  SHORT TERM GOALS: Target date: 12/02/22 Independent with initial HEP Goal status: met 12/04/22  LONG TERM GOALS: Target date: 02/17/23  Independent with advanced HEP or gym program Goal status: INITIAL  2.  Decrease TUG time to 24 seconds Goal status: INITIAL  3.  Walk 300 feet in 3 minute walk test Goal status: INITIAL  4.  Be able to negotiate stairs and curbs with SBA with one at a time and a handrail Goal status: INITIAL  5.  Report able to warm up a meal without sitting Goal status: INITIAL   PLAN:  PT FREQUENCY: 1-2x/week  PT DURATION: 12 weeks  PLANNED INTERVENTIONS: Therapeutic exercises, Therapeutic activity, Neuromuscular re-education, Balance training, Gait training, Patient/Family education, Self Care, Joint mobilization, and Manual therapy  PLAN FOR NEXT SESSION: add exercises, balance   Jearld Lesch, PT 12/04/2022, 6:16 PM [place] [facility] [address] [facility]. [place], [place], [address] Phone: [phone]   Fax:  [phone]

## 2022-12-09 ENCOUNTER — Ambulatory Visit: Payer: Medicare Other | Attending: Family Medicine | Admitting: Physical Therapy

## 2022-12-09 ENCOUNTER — Ambulatory Visit
Admission: RE | Admit: 2022-12-09 | Discharge: 2022-12-09 | Disposition: A | Discharge status: Home / Self Care | Payer: Medicare Other | Source: Ambulatory Visit | Attending: Family Medicine | Admitting: Family Medicine

## 2022-12-09 ENCOUNTER — Encounter: Payer: Self-pay | Admitting: Physical Therapy

## 2022-12-09 DIAGNOSIS — R262 Difficulty in walking, not elsewhere classified: Secondary | ICD-10-CM | POA: Insufficient documentation

## 2022-12-09 DIAGNOSIS — R2681 Unsteadiness on feet: Secondary | ICD-10-CM | POA: Diagnosis not present

## 2022-12-09 DIAGNOSIS — R2241 Localized swelling, mass and lump, right lower limb: Secondary | ICD-10-CM | POA: Diagnosis not present

## 2022-12-09 DIAGNOSIS — M25461 Effusion, right knee: Secondary | ICD-10-CM

## 2022-12-09 DIAGNOSIS — M6281 Muscle weakness (generalized): Secondary | ICD-10-CM | POA: Diagnosis not present

## 2022-12-09 NOTE — Therapy (Signed)
OUTPATIENT PHYSICAL THERAPY LOWER EXTREMITY TREATMENT   Patient Name: Pamela Snyder MRN: [id number] DOB:Sep 13, 1945, 77 y.o., female Today's Date: 12/09/2022  END OF SESSION:  PT End of Session - 12/09/22 1630     Visit Number 6    Date for PT Re-Evaluation 02/17/23    Authorization Type Medicare    PT Start Time 1612    PT Stop Time 1700    PT Time Calculation (min) 48 min    Activity Tolerance Patient tolerated treatment well    Behavior During Therapy WFL for tasks assessed/performed             Past Medical History:  Diagnosis Date   Anxiety    Arthritis    "right foot; spine; hands" (11/23/2014)   Charcot foot due to diabetes mellitus (HCC)    Depression    GERD (gastroesophageal reflux disease)    Gout    Hyperlipemia    Hypertension    Migraine    hx   Neuropathy    Numbness    Thyroid goiter 1986   Type II diabetes mellitus (HCC)    Past Surgical History:  Procedure Laterality Date   ABDOMINAL HYSTERECTOMY  1999   w/BSO   ACHILLES TENDON LENGTHENING Right 11/23/2014   ACHILLES TENDON LENGTHENING Right 11/23/2014   Procedure: RIGHT ACHILLES PERCUTANEOUS TENDON LENGTHENING;  Surgeon: Toni Arthurs, MD;  Location: [facility];  Service: Orthopedics;  Laterality: Right;   APPENDECTOMY  1963   ARTHRODESIS Right 11/23/2014   mid foot   CARDIAC CATHETERIZATION  08/2004   FOOT ARTHRODESIS Right 11/23/2014   Procedure: RIGHT MID FOOT ARTHRODESIS;  Surgeon: Toni Arthurs, MD;  Location: [facility];  Service: Orthopedics;  Laterality: Right;   METATARSAL OSTEOTOMY Right 11/23/2014   Procedure: RIGHT MID FOOT OSTEOTOMY;  Surgeon: Toni Arthurs, MD;  Location: [facility];  Service: Orthopedics;  Laterality: Right;   ORIF ANKLE FRACTURE Left 05/16/2021   Procedure: OPEN REDUCTION INTERNAL FIXATION (ORIF) Left ankle lateral malleolus, possible deltoid ligament repair;  Surgeon: Toni Arthurs, MD;  Location: [facility];  Service: Orthopedics;  Laterality: Left;   OSTEOTOMY Right 11/23/2014   mid  foot   PARTIAL THYMECTOMY  1986   ? side   TONSILLECTOMY  1954   Patient Active Problem List   Diagnosis Date Noted   Closed low lateral malleolus fracture 05/16/2021   Gout 05/16/2021   Hyperlipemia 05/16/2021   Hypertension 05/16/2021   Type II diabetes mellitus (HCC) 05/16/2021   Spinal stenosis of lumbar region 03/10/2019   Cervical myelopathy (HCC) 03/10/2019   Peripheral neuropathy 02/02/2019   Gait abnormality 02/02/2019   Urinary urgency 02/02/2019   Chronic bilateral low back pain without sciatica 02/02/2019   DOE (dyspnea on exertion) 10/05/2017   Bruit of right carotid artery 10/05/2017   Coronary artery calcification seen on CT scan 10/05/2017   Chest pain 10/04/2017   Aortic atherosclerosis (HCC) 10/04/2017   Charcot foot due to diabetes mellitus (HCC) 11/23/2014    PCP: Jarrett Soho, PA  REFERRING PROVIDER: Delena Serve, PA  REFERRING DIAG: poor balance, weakness, difficulty walking  THERAPY DIAG:  Muscle weakness (generalized)  Unsteady gait  Difficulty in walking, not elsewhere classified  Rationale for Evaluation and Treatment: Rehabilitation  ONSET DATE: 11/03/22  SUBJECTIVE:    SUBJECTIVE STATEMENT:  I would like to walk today  PERTINENT HISTORY: See above PAIN:  Are you having pain? Yes: NPRS scale: 4/10 Pain location: low back Pain description: tight, stabbing Aggravating factors: being on feet longer,  twisting pain pain up to 6/10 Relieving factors: rest pain will go to 0/10  PRECAUTIONS: Fall  WEIGHT BEARING RESTRICTIONS: No  FALLS:  Has patient fallen in last 6 months? No, just unsteady  LIVING ENVIRONMENT: Lives with: alone Lives in: House/apartment Stairs: Yes: Internal: 12 steps; can reach both Has following equipment at home: Walker - 2 wheeled  OCCUPATION: retired  PLOF: uses walker for household mobility, does her cooking and cleaning using a FWW  PATIENT GOALS: may go to the beach in the fall, attend women's group,  feel stronger, more steady, stay living at home independently  NEXT MD VISIT: none scheduled  OBJECTIVE:   DIAGNOSTIC FINDINGS: none  COGNITION: Overall cognitive status: Within functional limits for tasks assessed     SENSATION: Mid shin down very limited sensation light touch can feel some deeper touch  EDEMA:  Very mild at the ankles  MUSCLE LENGTH: Mild tightness in the HS  POSTURE: rounded shoulders and forward head  PALPATION: Tight and tender in the lumbar paraspinals  LOWER EXTREMITY ROM:  Active ROM Right eval Left eval  Hip flexion 80 80  Hip extension    Hip abduction    Hip adduction    Hip internal rotation    Hip external rotation    Knee flexion    Knee extension 0 0  Ankle dorsiflexion 0 0  Ankle plantarflexion    Ankle inversion    Ankle eversion     (Blank rows = not tested)  LOWER EXTREMITY MMT:  MMT Right eval Left eval  Hip flexion 4 4-  Hip extension 4- 4-  Hip abduction 4 4-  Hip adduction    Hip internal rotation    Hip external rotation    Knee flexion 4 4-  Knee extension 4 3+  Ankle dorsiflexion 2 0  Ankle plantarflexion 1 1  Ankle inversion 1 0  Ankle eversion 0 0   (Blank rows = not tested) FUNCTIONAL TESTS:  TUG: 40 seconds with FWW at evaluation walk test with CGA and FWW = 200 feet at evaluation Unable to stand without holding onto something  GAIT: Distance walked: 200 feet Assistive device utilized: Walker - 2 wheeled Level of assistance: SBA to start and then needs CGA Comments: foot drop bilaterally, decreased foot control, some hip drop on the left side, when fatigued poor safety with transfers   TODAY'S TREATMENT:                                                                                                                              DATE:  12/09/22 Gait outside half parking island rest and then around building to the front CGA/MinA for curbs Standing folding towels Volleyball seated Side  stepping Lat pulls 20# Stairs with handrails step over step 25# leg curls 15# leg extension  12/04/22 Gait outside around 1/2 the parking [place] then sit and rest and then from there around to the front  of the building Volley ball sitting Supine bridges Feet on ball small trunk rotation Isometric abs Hooklying clamshells green tband Supine green tband extension Isometric abs Getting up from supine had back pain, used theragun on lumbar pspinals Side stepping  12/03/22 NuStep L5x55mins  Standing flexion 2.5# 2x20 reps alt  Standing hip abd 2.5# 2x10  STS 2x5 from elevated mat table Modified sit ups from wedge holding yellow ball 2x10  Fitter pushes 1 black band 2x10 Leg press 40# 2x10   11/27/22 Nustep L 5 ( with short burst of increased speed) STS with HHA working on increased wt thru LE vs heavy arm use,  mat 3 sets 5 Ball tap standing mod-max A to stab HHA side stepping 10 feet 2 x each min A HHA walking fwd and back 10 feet 2 x each min-mod A HHA stepping over 1 in fwd BIL mod A and side min A 10 x Walk half way around [address] and back through shortcut - min A navigating walker up and down curb HS curl 25# 2 sets 10 Knee ext 2 sets 10 15# Lat PUll Down 2 sets 10  25#   11/26/22 Walk half way around [address] and back through shortcut  NuStep L5x13mins Leg press 20# 2x10  Bridges x10 Trunk rotations x10 STM to R low back with massage gun 5 mins Heel taps at stairs 4" 20 reps alt  Going up stairs 1x, alt with 2HHA on rails Volleyball hits seated    PATIENT EDUCATION:  Education details: POC Person educated: Patient Education method: Explanation Education comprehension: verbalized understanding  HOME EXERCISE PROGRAM: TBD  ASSESSMENT:  CLINICAL IMPRESSION: Patient is motivated to walk, she does well just needs CGA for this outside and minA for the curbs.  The neuropathy in the feet really limits her balance, I did have her do some standing but  holding with one hand and folding towels with the other hand, she did well but this was a little scary for her  OBJECTIVE IMPAIRMENTS: Abnormal gait, cardiopulmonary status limiting activity, decreased activity tolerance, decreased balance, decreased coordination, decreased endurance, decreased mobility, difficulty walking, decreased ROM, decreased strength, impaired flexibility, impaired sensation, and pain.   REHAB POTENTIAL: Good  CLINICAL DECISION MAKING: Stable/uncomplicated  EVALUATION COMPLEXITY: Low   GOALS: Goals reviewed with patient? Yes  SHORT TERM GOALS: Target date: 12/02/22 Independent with initial HEP Goal status: met 12/04/22  LONG TERM GOALS: Target date: 02/17/23  Independent with advanced HEP or gym program Goal status: INITIAL  2.  Decrease TUG time to 24 seconds Goal status: INITIAL  3.  Walk 300 feet in 3 minute walk test Goal status: INITIAL  4.  Be able to negotiate stairs and curbs with SBA with one at a time and a handrail Goal status: INITIAL  5.  Report able to warm up a meal without sitting Goal status: INITIAL   PLAN:  PT FREQUENCY: 1-2x/week  PT DURATION: 12 weeks  PLANNED INTERVENTIONS: Therapeutic exercises, Therapeutic activity, Neuromuscular re-education, Balance training, Gait training, Patient/Family education, Self Care, Joint mobilization, and Manual therapy  PLAN FOR NEXT SESSION: add exercises, balance   Jearld Lesch, PT 12/09/2022, 4:31 PM [place] [facility] at [facility] [address] [facility]. [place], [place], [address] Phone: [phone]   Fax:  [phone]

## 2022-12-11 ENCOUNTER — Ambulatory Visit: Payer: Medicare Other | Admitting: Physical Therapy

## 2022-12-11 DIAGNOSIS — R262 Difficulty in walking, not elsewhere classified: Secondary | ICD-10-CM | POA: Diagnosis not present

## 2022-12-11 DIAGNOSIS — M6281 Muscle weakness (generalized): Secondary | ICD-10-CM | POA: Diagnosis not present

## 2022-12-11 DIAGNOSIS — R2681 Unsteadiness on feet: Secondary | ICD-10-CM

## 2022-12-11 NOTE — Therapy (Signed)
OUTPATIENT PHYSICAL THERAPY LOWER EXTREMITY TREATMENT   Patient Name: Pamela Snyder MRN: [id number] DOB:[birth year], 77 y.o., female Today's Date: 12/11/2022  END OF SESSION:  PT End of Session - 12/11/22 1717     Visit Number 7    Date for PT Re-Evaluation 02/17/23    Authorization Type Medicare    PT Start Time 1705    PT Stop Time 1750    PT Time Calculation (min) 45 min             Past Medical History:  Diagnosis Date   Anxiety    Arthritis    "right foot; spine; hands" (11/23/2014)   Charcot foot due to diabetes mellitus (HCC)    Depression    GERD (gastroesophageal reflux disease)    Gout    Hyperlipemia    Hypertension    Migraine    hx   Neuropathy    Numbness    Thyroid goiter 1986   Type II diabetes mellitus (HCC)    Past Surgical History:  Procedure Laterality Date   ABDOMINAL HYSTERECTOMY  1999   w/BSO   ACHILLES TENDON LENGTHENING Right 11/23/2014   ACHILLES TENDON LENGTHENING Right 11/23/2014   Procedure: RIGHT ACHILLES PERCUTANEOUS TENDON LENGTHENING;  Surgeon: Toni Arthurs, MD;  [facility]: [facility];  Service: Orthopedics;  Laterality: Right;   APPENDECTOMY  1963   ARTHRODESIS Right 11/23/2014   mid foot   CARDIAC CATHETERIZATION  08/2004   FOOT ARTHRODESIS Right 11/23/2014   Procedure: RIGHT MID FOOT ARTHRODESIS;  Surgeon: Toni Arthurs, MD;  [facility]: [facility];  Service: Orthopedics;  Laterality: Right;   METATARSAL OSTEOTOMY Right 11/23/2014   Procedure: RIGHT MID FOOT OSTEOTOMY;  Surgeon: Toni Arthurs, MD;  Location: [facility];  Service: Orthopedics;  Laterality: Right;   ORIF ANKLE FRACTURE Left 05/16/2021   Procedure: OPEN REDUCTION INTERNAL FIXATION (ORIF) Left ankle lateral malleolus, possible deltoid ligament repair;  Surgeon: Toni Arthurs, MD;  [facility];  Service: Orthopedics;  Laterality: Left;   OSTEOTOMY Right 11/23/2014   mid foot   PARTIAL THYMECTOMY  1986   ? side   TONSILLECTOMY  1954   Patient Active Problem List   Diagnosis Date  Noted   Closed low lateral malleolus fracture 05/16/2021   Gout 05/16/2021   Hyperlipemia 05/16/2021   Hypertension 05/16/2021   Type II diabetes mellitus (HCC) 05/16/2021   Spinal stenosis of lumbar region 03/10/2019   Cervical myelopathy (HCC) 03/10/2019   Peripheral neuropathy 02/02/2019   Gait abnormality 02/02/2019   Urinary urgency 02/02/2019   Chronic bilateral low back pain without sciatica 02/02/2019   DOE (dyspnea on exertion) 10/05/2017   Bruit of right carotid artery 10/05/2017   Coronary artery calcification seen on CT scan 10/05/2017   Chest pain 10/04/2017   Aortic atherosclerosis (HCC) 10/04/2017   Charcot foot due to diabetes mellitus (HCC) 11/23/2014    PCP: Jarrett Soho, PA  REFERRING PROVIDER: Delena Serve, PA  REFERRING DIAG: poor balance, weakness, difficulty walking  THERAPY DIAG:  Muscle weakness (generalized)  Unsteady gait  Difficulty in walking, not elsewhere classified  Rationale for Evaluation and Treatment: Rehabilitation  ONSET DATE: 11/03/22  SUBJECTIVE:    SUBJECTIVE STATEMENT:  not feeling the best today. Very sore after last session  Checked BP 133/87 after walk  PERTINENT HISTORY: See above PAIN:  Are you having pain? Yes: NPRS scale: 4/10 Pain location: low back Pain description: tight, stabbing Aggravating factors: being on feet longer, twisting pain pain up to 6/10 Relieving factors: rest  pain will go to 0/10  PRECAUTIONS: Fall  WEIGHT BEARING RESTRICTIONS: No  FALLS:  Has patient fallen in last 6 months? No, just unsteady  LIVING ENVIRONMENT: Lives with: alone Lives in: House/apartment Stairs: Yes: Internal: 12 steps; can reach both Has following equipment at home: Walker - 2 wheeled  OCCUPATION: retired  PLOF: uses walker for household mobility, does her cooking and cleaning using a FWW  PATIENT GOALS: may go to the beach in the fall, attend women's group, feel stronger, more steady, stay living at home  independently  NEXT MD VISIT: none scheduled  OBJECTIVE:   DIAGNOSTIC FINDINGS: none  COGNITION: Overall cognitive status: Within functional limits for tasks assessed     SENSATION: Mid shin down very limited sensation light touch can feel some deeper touch  EDEMA:  Very mild at the ankles  MUSCLE LENGTH: Mild tightness in the HS  POSTURE: rounded shoulders and forward head  PALPATION: Tight and tender in the lumbar paraspinals  LOWER EXTREMITY ROM:  Active ROM Right eval Left eval  Hip flexion 80 80  Hip extension    Hip abduction    Hip adduction    Hip internal rotation    Hip external rotation    Knee flexion    Knee extension 0 0  Ankle dorsiflexion 0 0  Ankle plantarflexion    Ankle inversion    Ankle eversion     (Blank rows = not tested)  LOWER EXTREMITY MMT:  MMT Right eval Left eval  Hip flexion 4 4-  Hip extension 4- 4-  Hip abduction 4 4-  Hip adduction    Hip internal rotation    Hip external rotation    Knee flexion 4 4-  Knee extension 4 3+  Ankle dorsiflexion 2 0  Ankle plantarflexion 1 1  Ankle inversion 1 0  Ankle eversion 0 0   (Blank rows = not tested) FUNCTIONAL TESTS:  TUG: 40 seconds with FWW at evaluation walk test with CGA and FWW = 200 feet at evaluation Unable to stand without holding onto something  GAIT: Distance walked: 200 feet Assistive device utilized: Environmental consultant - 2 wheeled Level of assistance: SBA to start and then needs CGA Comments: foot drop bilaterally, decreased foot control, some hip drop on the left side, when fatigued poor safety with transfers   TODAY'S TREATMENT:                                                                                                                              DATE:   12/11/22 Gait from front door around back to door 2 moments of instability and increased left foot drop noted Nustep L 5 6 min Standing on airex facing mat then lifting hands off for 5 sec CG-Min A 10  x Side stepping at mat including stepping on and over airex 5 x each way- trouble with stepping on shoe Standing at elevated mat 1 hand on mat 1  HHA marching fwd and walking back 8 x STS from chair to elevated mat with UE use min A with cuing to push thru LE 2 sets 5    12/09/22 Gait outside half parking [place] rest and then around building to the front CGA/MinA for curbs Standing folding towels Volleyball seated Side stepping Lat pulls 20# Stairs with handrails step over step 25# leg curls 15# leg extension  12/04/22 Gait outside around 1/2 the parking [place] then sit and rest and then from there around to the front of the building Volley ball sitting Supine bridges Feet on ball small trunk rotation Isometric abs Hooklying clamshells green tband Supine green tband extension Isometric abs Getting up from supine had back pain, used theragun on lumbar pspinals Side stepping  12/03/22 NuStep L5x58mins  Standing flexion 2.5# 2x20 reps alt  Standing hip abd 2.5# 2x10  STS 2x5 from elevated mat table Modified sit ups from wedge holding yellow ball 2x10  Fitter pushes 1 black band 2x10 Leg press 40# 2x10   11/27/22 Nustep L 5 ( with short burst of increased speed) STS with HHA working on increased wt thru LE vs heavy arm use,  mat 3 sets 5 Ball tap standing mod-max A to stab HHA side stepping 10 feet 2 x each min A HHA walking fwd and back 10 feet 2 x each min-mod A HHA stepping over 1 in fwd BIL mod A and side min A 10 x Walk half way around [address] and back through shortcut - min A navigating walker up and down curb HS curl 25# 2 sets 10 Knee ext 2 sets 10 15# Lat PUll Down 2 sets 10  25#   11/26/22 Walk half way around [address] and back through shortcut  NuStep L5x44mins Leg press 20# 2x10  Bridges x10 Trunk rotations x10 STM to R low back with massage gun 5 mins Heel taps at stairs 4" 20 reps alt  Going up stairs 1x, alt with 2HHA on rails Volleyball  hits seated    PATIENT EDUCATION:  Education details: POC Person educated: Patient Education method: Explanation Education comprehension: verbalized understanding  HOME EXERCISE PROGRAM: TBD  ASSESSMENT:  CLINICAL IMPRESSION: Patient is motivated to walk, she does well just needs CGA for this outside and minA for the curbs, she did have 2 moments on instability today and increased left foot drop noted. The neuropathy in the feet really limits her balance, did a lot of standing stuff with various directions and dynamic surfaces, she did well but this was a little scary for her  OBJECTIVE IMPAIRMENTS: Abnormal gait, cardiopulmonary status limiting activity, decreased activity tolerance, decreased balance, decreased coordination, decreased endurance, decreased mobility, difficulty walking, decreased ROM, decreased strength, impaired flexibility, impaired sensation, and pain.   REHAB POTENTIAL: Good  CLINICAL DECISION MAKING: Stable/uncomplicated  EVALUATION COMPLEXITY: Low   GOALS: Goals reviewed with patient? Yes  SHORT TERM GOALS: Target date: 12/02/22 Independent with initial HEP Goal status: met 12/04/22  LONG TERM GOALS: Target date: 02/17/23  Independent with advanced HEP or gym program Goal status: INITIAL  2.  Decrease TUG time to 24 seconds Goal status: INITIAL  3.  Walk 300 feet in 3 minute walk test Goal status: INITIAL  4.  Be able to negotiate stairs and curbs with SBA with one at a time and a handrail Goal status: 12/09/22 progressing  5.  Report able to warm up a meal without sitting Goal status: 12/11/22 progressing   PLAN:  PT  FREQUENCY: 1-2x/week  PT DURATION: 12 weeks  PLANNED INTERVENTIONS: Therapeutic exercises, Therapeutic activity, Neuromuscular re-education, Balance training, Gait training, Patient/Family education, Self Care, Joint mobilization, and Manual therapy  PLAN FOR NEXT SESSION: add exercises, balance   Treasa Bradshaw,ANGIE,  PTA 12/11/2022, 5:18 PM [facility] at [facility] [address] [facility] [place], [place], [address] Phone: [phone]   Fax:  [phone] at [facility] [address] [facility] [address]. [place], [place], [address] Phone: [phone]   Fax:  [phone]  Patient Details  Name: Karenda Dore MRN: [id number] Date of Birth: 06/01/1946 Referring Provider:  Jarrett Soho, PA-C  Encounter Date: 12/11/2022   Suanne Marker, PTA 12/11/2022, 5:18 PM  [facility] [place] [facility] at [facility] [address] [facility] [place], [place], [address] Phone: [phone]   Fax:  [phone]

## 2022-12-16 ENCOUNTER — Ambulatory Visit: Payer: Medicare Other | Admitting: Physical Therapy

## 2022-12-16 ENCOUNTER — Encounter: Payer: Self-pay | Admitting: Physical Therapy

## 2022-12-16 DIAGNOSIS — R262 Difficulty in walking, not elsewhere classified: Secondary | ICD-10-CM | POA: Diagnosis not present

## 2022-12-16 DIAGNOSIS — M6281 Muscle weakness (generalized): Secondary | ICD-10-CM

## 2022-12-16 DIAGNOSIS — R2681 Unsteadiness on feet: Secondary | ICD-10-CM | POA: Diagnosis not present

## 2022-12-16 NOTE — Therapy (Signed)
OUTPATIENT PHYSICAL THERAPY LOWER EXTREMITY TREATMENT   Patient Name: Pamela Snyder MRN: [id number] DOB:08/22/1945, 77 y.o., female Today's Date: 12/16/2022  END OF SESSION:  PT End of Session - 12/16/22 1614     Visit Number 8    Date for PT Re-Evaluation 02/17/23    Authorization Type Medicare    PT Start Time 1610    PT Stop Time 1700    PT Time Calculation (min) 50 min    Activity Tolerance Patient tolerated treatment well    Behavior During Therapy WFL for tasks assessed/performed             Past Medical History:  Diagnosis Date   Anxiety    Arthritis    "right foot; spine; hands" (11/23/2014)   Charcot foot due to diabetes mellitus (HCC)    Depression    GERD (gastroesophageal reflux disease)    Gout    Hyperlipemia    Hypertension    Migraine    hx   Neuropathy    Numbness    Thyroid goiter 1986   Type II diabetes mellitus (HCC)    Past Surgical History:  Procedure Laterality Date   ABDOMINAL HYSTERECTOMY  1999   w/BSO   ACHILLES TENDON LENGTHENING Right 11/23/2014   ACHILLES TENDON LENGTHENING Right 11/23/2014   Procedure: RIGHT ACHILLES PERCUTANEOUS TENDON LENGTHENING;  Surgeon: Toni Arthurs, MD;  [facility]: [facility];  Service: Orthopedics;  Laterality: Right;   APPENDECTOMY  1963   ARTHRODESIS Right 11/23/2014   mid foot   CARDIAC CATHETERIZATION  08/2004   FOOT ARTHRODESIS Right 11/23/2014   Procedure: RIGHT MID FOOT ARTHRODESIS;  Surgeon: Toni Arthurs, MD;  [facility]: [facility];  Service: Orthopedics;  Laterality: Right;   METATARSAL OSTEOTOMY Right 11/23/2014   Procedure: RIGHT MID FOOT OSTEOTOMY;  Surgeon: Toni Arthurs, MD;  [facility]: [facility];  Service: Orthopedics;  Laterality: Right;   ORIF ANKLE FRACTURE Left 05/16/2021   Procedure: OPEN REDUCTION INTERNAL FIXATION (ORIF) Left ankle lateral malleolus, possible deltoid ligament repair;  Surgeon: Toni Arthurs, MD;  [facility]: [facility];  Service: Orthopedics;  Laterality: Left;   OSTEOTOMY Right 11/23/2014    mid foot   PARTIAL THYMECTOMY  1986   ? side   TONSILLECTOMY  1954   Patient Active Problem List   Diagnosis Date Noted   Closed low lateral malleolus fracture 05/16/2021   Gout 05/16/2021   Hyperlipemia 05/16/2021   Hypertension 05/16/2021   Type II diabetes mellitus (HCC) 05/16/2021   Spinal stenosis of lumbar region 03/10/2019   Cervical myelopathy (HCC) 03/10/2019   Peripheral neuropathy 02/02/2019   Gait abnormality 02/02/2019   Urinary urgency 02/02/2019   Chronic bilateral low back pain without sciatica 02/02/2019   DOE (dyspnea on exertion) 10/05/2017   Bruit of right carotid artery 10/05/2017   Coronary artery calcification seen on CT scan 10/05/2017   Chest pain 10/04/2017   Aortic atherosclerosis (HCC) 10/04/2017   Charcot foot due to diabetes mellitus (HCC) 11/23/2014    PCP: Jarrett Soho, PA  REFERRING PROVIDER: Delena Serve, PA  REFERRING DIAG: poor balance, weakness, difficulty walking  THERAPY DIAG:  Muscle weakness (generalized)  Unsteady gait  Difficulty in walking, not elsewhere classified  Rationale for Evaluation and Treatment: Rehabilitation  ONSET DATE: 11/03/22  SUBJECTIVE:    SUBJECTIVE STATEMENT:  Back is hurting a little more today, feel like I need to walk  PERTINENT HISTORY: See above PAIN:  Are you having pain? Yes: NPRS scale: 4/10 Pain location: low back Pain description: tight,  stabbing Aggravating factors: being on feet longer, twisting pain pain up to 6/10 Relieving factors: rest pain will go to 0/10  PRECAUTIONS: Fall  WEIGHT BEARING RESTRICTIONS: No  FALLS:  Has patient fallen in last 6 months? No, just unsteady  LIVING ENVIRONMENT: Lives with: alone Lives in: House/apartment Stairs: Yes: Internal: 12 steps; can reach both Has following equipment at home: Walker - 2 wheeled  OCCUPATION: retired  PLOF: uses walker for household mobility, does her cooking and cleaning using a FWW  PATIENT GOALS: may go to the  beach in the fall, attend women's group, feel stronger, more steady, stay living at home independently  NEXT MD VISIT: none scheduled  OBJECTIVE:   DIAGNOSTIC FINDINGS: none  COGNITION: Overall cognitive status: Within functional limits for tasks assessed     SENSATION: Mid shin down very limited sensation light touch can feel some deeper touch  EDEMA:  Very mild at the ankles  MUSCLE LENGTH: Mild tightness in the HS  POSTURE: rounded shoulders and forward head  PALPATION: Tight and tender in the lumbar paraspinals  LOWER EXTREMITY ROM:  Active ROM Right eval Left eval  Hip flexion 80 80  Hip extension    Hip abduction    Hip adduction    Hip internal rotation    Hip external rotation    Knee flexion    Knee extension 0 0  Ankle dorsiflexion 0 0  Ankle plantarflexion    Ankle inversion    Ankle eversion     (Blank rows = not tested)  LOWER EXTREMITY MMT:  MMT Right eval Left eval  Hip flexion 4 4-  Hip extension 4- 4-  Hip abduction 4 4-  Hip adduction    Hip internal rotation    Hip external rotation    Knee flexion 4 4-  Knee extension 4 3+  Ankle dorsiflexion 2 0  Ankle plantarflexion 1 1  Ankle inversion 1 0  Ankle eversion 0 0   (Blank rows = not tested) FUNCTIONAL TESTS:  TUG: 40 seconds with FWW at evaluation walk test with CGA and FWW = 200 feet at evaluation Unable to stand without holding onto something  GAIT: Distance walked: 200 feet Assistive device utilized: Environmental consultant - 2 wheeled Level of assistance: SBA to start and then needs CGA Comments: foot drop bilaterally, decreased foot control, some hip drop on the left side, when fatigued poor safety with transfers   TODAY'S TREATMENT:                                                                                                                              DATE:  12/16/22 Nustep level 5 x 5 minutes Gait outside total of about 700-800 feet with 2 rests CGA, some min A down  curbs, Mod A at times coming up curb Volley ball in sitting Leg curls 25# Lats 25#  12/11/22 Gait from front door around back to door 2 moments of instability  and increased left foot drop noted Nustep L 5 6 min Standing on airex facing mat then lifting hands off for 5 sec CG-Min A 10 x Side stepping at mat including stepping on and over airex 5 x each way- trouble with stepping on shoe Standing at elevated mat 1 hand on mat 1 HHA marching fwd and walking back 8 x STS from chair to elevated mat with UE use min A with cuing to push thru LE 2 sets 5    12/09/22 Gait outside half parking [place] rest and then around building to the front CGA/MinA for curbs Standing folding towels Volleyball seated Side stepping Lat pulls 20# Stairs with handrails step over step 25# leg curls 15# leg extension  12/04/22 Gait outside around 1/2 the parking [place] then sit and rest and then from there around to the front of the building Volley ball sitting Supine bridges Feet on ball small trunk rotation Isometric abs Hooklying clamshells green tband Supine green tband extension Isometric abs Getting up from supine had back pain, used theragun on lumbar pspinals Side stepping  12/03/22 NuStep L5x65mins  Standing flexion 2.5# 2x20 reps alt  Standing hip abd 2.5# 2x10  STS 2x5 from elevated mat table Modified sit ups from wedge holding yellow ball 2x10  Fitter pushes 1 black band 2x10 Leg press 40# 2x10   11/27/22 Nustep L 5 ( with short burst of increased speed) STS with HHA working on increased wt thru LE vs heavy arm use,  mat 3 sets 5 Ball tap standing mod-max A to stab HHA side stepping 10 feet 2 x each min A HHA walking fwd and back 10 feet 2 x each min-mod A HHA stepping over 1 in fwd BIL mod A and side min A 10 x Walk half way around [address] and back through shortcut - min A navigating walker up and down curb HS curl 25# 2 sets 10 Knee ext 2 sets 10 15# Lat PUll Down 2 sets 10   25#   11/26/22 Walk half way around [address] and back through shortcut  NuStep L5x2mins Leg press 20# 2x10  Bridges x10 Trunk rotations x10 STM to R low back with massage gun 5 mins Heel taps at stairs 4" 20 reps alt  Going up stairs 1x, alt with 2HHA on rails Volleyball hits seated    PATIENT EDUCATION:  Education details: POC Person educated: Patient Education method: Explanation Education comprehension: verbalized understanding  HOME EXERCISE PROGRAM: TBD  ASSESSMENT:  CLINICAL IMPRESSION: We did a lot more walking today with FWW, and CGA, down curbs occasional min A for walker, and going up curbs occasional mod A to get up the curb.  We will work on her balance and overall fitness  OBJECTIVE IMPAIRMENTS: Abnormal gait, cardiopulmonary status limiting activity, decreased activity tolerance, decreased balance, decreased coordination, decreased endurance, decreased mobility, difficulty walking, decreased ROM, decreased strength, impaired flexibility, impaired sensation, and pain.   REHAB POTENTIAL: Good  CLINICAL DECISION MAKING: Stable/uncomplicated  EVALUATION COMPLEXITY: Low   GOALS: Goals reviewed with patient? Yes  SHORT TERM GOALS: Target date: 12/02/22 Independent with initial HEP Goal status: met 12/04/22  LONG TERM GOALS: Target date: 02/17/23  Independent with advanced HEP or gym program Goal status: INITIAL  2.  Decrease TUG time to 24 seconds Goal status:ongoing 12/16/22  3.  Walk 300 feet in 3 minute walk test Goal status: INITIAL  4.  Be able to negotiate stairs and curbs with SBA with one at  a time and a handrail Goal status: 12/09/22 progressing  5.  Report able to warm up a meal without sitting Goal status: 12/11/22 progressing   PLAN:  PT FREQUENCY: 1-2x/week  PT DURATION: 12 weeks  PLANNED INTERVENTIONS: Therapeutic exercises, Therapeutic activity, Neuromuscular re-education, Balance training, Gait training, Patient/Family  education, Self Care, Joint mobilization, and Manual therapy  PLAN FOR NEXT SESSION: add exercises, balance   Jearld Lesch, PT 12/16/2022, 4:15 PM [facility] Out[facility] Rehabilitation at [facility] [address] [facility]. [place], [place], [address] Phone: [phone]   Fax:  [phone] at [facility] [address] [facility] [address] [place], [place], [address] Phone: [phone]   Fax:  [phone]

## 2022-12-18 ENCOUNTER — Ambulatory Visit: Payer: Medicare Other

## 2022-12-18 DIAGNOSIS — R2681 Unsteadiness on feet: Secondary | ICD-10-CM

## 2022-12-18 DIAGNOSIS — M6281 Muscle weakness (generalized): Secondary | ICD-10-CM

## 2022-12-18 DIAGNOSIS — R262 Difficulty in walking, not elsewhere classified: Secondary | ICD-10-CM | POA: Diagnosis not present

## 2022-12-18 NOTE — Therapy (Signed)
OUTPATIENT PHYSICAL THERAPY LOWER EXTREMITY TREATMENT   Patient Name: Pamela Snyder MRN: [id number] DOB:1945/12/31, 77 y.o., female Today's Date: 12/18/2022  END OF SESSION:  PT End of Session - 12/18/22 1717     Visit Number 9    Date for PT Re-Evaluation 02/17/23    Authorization Type Medicare    PT Start Time 1716    PT Stop Time 1800    PT Time Calculation (min) 44 min    Activity Tolerance Patient tolerated treatment well    Behavior During Therapy WFL for tasks assessed/performed             Past Medical History:  Diagnosis Date   Anxiety    Arthritis    "right foot; spine; hands" (11/23/2014)   Charcot foot due to diabetes mellitus (HCC)    Depression    GERD (gastroesophageal reflux disease)    Gout    Hyperlipemia    Hypertension    Migraine    hx   Neuropathy    Numbness    Thyroid goiter 1986   Type II diabetes mellitus (HCC)    Past Surgical History:  Procedure Laterality Date   ABDOMINAL HYSTERECTOMY  1999   w/BSO   ACHILLES TENDON LENGTHENING Right 11/23/2014   ACHILLES TENDON LENGTHENING Right 11/23/2014   Procedure: RIGHT ACHILLES PERCUTANEOUS TENDON LENGTHENING;  Surgeon: Toni Arthurs, MD;  Location: [facility];  Service: Orthopedics;  Laterality: Right;   APPENDECTOMY  1963   ARTHRODESIS Right 11/23/2014   mid foot   CARDIAC CATHETERIZATION  08/2004   FOOT ARTHRODESIS Right 11/23/2014   Procedure: RIGHT MID FOOT ARTHRODESIS;  Surgeon: Toni Arthurs, MD;  Location: [facility];  Service: Orthopedics;  Laterality: Right;   METATARSAL OSTEOTOMY Right 11/23/2014   Procedure: RIGHT MID FOOT OSTEOTOMY;  Surgeon: Toni Arthurs, MD;  Location: [facility];  Service: Orthopedics;  Laterality: Right;   ORIF ANKLE FRACTURE Left 05/16/2021   Procedure: OPEN REDUCTION INTERNAL FIXATION (ORIF) Left ankle lateral malleolus, possible deltoid ligament repair;  Surgeon: Toni Arthurs, MD;  Location: [facility];  Service: Orthopedics;  Laterality: Left;   OSTEOTOMY Right 11/23/2014    mid foot   PARTIAL THYMECTOMY  1986   ? side   TONSILLECTOMY  1954   Patient Active Problem List   Diagnosis Date Noted   Closed low lateral malleolus fracture 05/16/2021   Gout 05/16/2021   Hyperlipemia 05/16/2021   Hypertension 05/16/2021   Type II diabetes mellitus (HCC) 05/16/2021   Spinal stenosis of lumbar region 03/10/2019   Cervical myelopathy (HCC) 03/10/2019   Peripheral neuropathy 02/02/2019   Gait abnormality 02/02/2019   Urinary urgency 02/02/2019   Chronic bilateral low back pain without sciatica 02/02/2019   DOE (dyspnea on exertion) 10/05/2017   Bruit of right carotid artery 10/05/2017   Coronary artery calcification seen on CT scan 10/05/2017   Chest pain 10/04/2017   Aortic atherosclerosis (HCC) 10/04/2017   Charcot foot due to diabetes mellitus (HCC) 11/23/2014    PCP: Jarrett Soho, PA  REFERRING PROVIDER: Delena Serve, PA  REFERRING DIAG: poor balance, weakness, difficulty walking  THERAPY DIAG:  Muscle weakness (generalized)  Unsteady gait  Difficulty in walking, not elsewhere classified  Rationale for Evaluation and Treatment: Rehabilitation  ONSET DATE: 11/03/22  SUBJECTIVE:    SUBJECTIVE STATEMENT:  Back is hurting a little more today, feel like I need to walk  PERTINENT HISTORY: See above PAIN:  Are you having pain? Yes: NPRS scale: 4/10 Pain location: low back Pain description: tight,  stabbing Aggravating factors: being on feet longer, twisting pain pain up to 6/10 Relieving factors: rest pain will go to 0/10  PRECAUTIONS: Fall  WEIGHT BEARING RESTRICTIONS: No  FALLS:  Has patient fallen in last 6 months? No, just unsteady  LIVING ENVIRONMENT: Lives with: alone Lives in: House/apartment Stairs: Yes: Internal: 12 steps; can reach both Has following equipment at home: Walker - 2 wheeled  OCCUPATION: retired  PLOF: uses walker for household mobility, does her cooking and cleaning using a FWW  PATIENT GOALS: may go to the  beach in the fall, attend women's group, feel stronger, more steady, stay living at home independently  NEXT MD VISIT: none scheduled  OBJECTIVE:   DIAGNOSTIC FINDINGS: none  COGNITION: Overall cognitive status: Within functional limits for tasks assessed     SENSATION: Mid shin down very limited sensation light touch can feel some deeper touch  EDEMA:  Very mild at the ankles  MUSCLE LENGTH: Mild tightness in the HS  POSTURE: rounded shoulders and forward head  PALPATION: Tight and tender in the lumbar paraspinals  LOWER EXTREMITY ROM:  Active ROM Right eval Left eval  Hip flexion 80 80  Hip extension    Hip abduction    Hip adduction    Hip internal rotation    Hip external rotation    Knee flexion    Knee extension 0 0  Ankle dorsiflexion 0 0  Ankle plantarflexion    Ankle inversion    Ankle eversion     (Blank rows = not tested)  LOWER EXTREMITY MMT:  MMT Right eval Left eval  Hip flexion 4 4-  Hip extension 4- 4-  Hip abduction 4 4-  Hip adduction    Hip internal rotation    Hip external rotation    Knee flexion 4 4-  Knee extension 4 3+  Ankle dorsiflexion 2 0  Ankle plantarflexion 1 1  Ankle inversion 1 0  Ankle eversion 0 0   (Blank rows = not tested) FUNCTIONAL TESTS:  TUG: 40 seconds with FWW at evaluation walk test with CGA and FWW = 200 feet at evaluation Unable to stand without holding onto something  GAIT: Distance walked: 200 feet Assistive device utilized: Environmental consultant - 2 wheeled Level of assistance: SBA to start and then needs CGA Comments: foot drop bilaterally, decreased foot control, some hip drop on the left side, when fatigued poor safety with transfers   TODAY'S TREATMENT:                                                                                                                              DATE:  12/18/22 Walking around [place], 1 rest break in between  NuStep L5 x23mins  Shoulder assessment - L shoulder  flexion 85d - L shoulder abd 60d - L ER 40d  - MMT flexion 2+, abd 2-  5# hip flexion tapping 6" step in RW   Shoulder flexion and abd 1#  2x10   12/16/22 Nustep level 5 x 5 minutes Gait outside total of about 700-800 feet with 2 rests CGA, some min A down curbs, Mod A at times coming up curb Volley ball in sitting Leg curls 25# Lats 25#  12/11/22 Gait from front door around back to door 2 moments of instability and increased left foot drop noted Nustep L 5 6 min Standing on airex facing mat then lifting hands off for 5 sec CG-Min A 10 x Side stepping at mat including stepping on and over airex 5 x each way- trouble with stepping on shoe Standing at elevated mat 1 hand on mat 1 HHA marching fwd and walking back 8 x STS from chair to elevated mat with UE use min A with cuing to push thru LE 2 sets 5    12/09/22 Gait outside half parking [place] rest and then around building to the front CGA/MinA for curbs Standing folding towels Volleyball seated Side stepping Lat pulls 20# Stairs with handrails step over step 25# leg curls 15# leg extension  12/04/22 Gait outside around 1/2 the parking [place] then sit and rest and then from there around to the front of the building Volley ball sitting Supine bridges Feet on ball small trunk rotation Isometric abs Hooklying clamshells green tband Supine green tband extension Isometric abs Getting up from supine had back pain, used theragun on lumbar pspinals Side stepping  12/03/22 NuStep L5x11mins  Standing flexion 2.5# 2x20 reps alt  Standing hip abd 2.5# 2x10  STS 2x5 from elevated mat table Modified sit ups from wedge holding yellow ball 2x10  Fitter pushes 1 black band 2x10 Leg press 40# 2x10   11/27/22 Nustep L 5 ( with short burst of increased speed) STS with HHA working on increased wt thru LE vs heavy arm use,  mat 3 sets 5 Ball tap standing mod-max A to stab HHA side stepping 10 feet 2 x each min A HHA walking fwd and  back 10 feet 2 x each min-mod A HHA stepping over 1 in fwd BIL mod A and side min A 10 x Walk half way around [address] and back through shortcut - min A navigating walker up and down curb HS curl 25# 2 sets 10 Knee ext 2 sets 10 15# Lat PUll Down 2 sets 10  25#   11/26/22 Walk half way around [address] and back through shortcut  NuStep L5x63mins Leg press 20# 2x10  Bridges x10 Trunk rotations x10 STM to R low back with massage gun 5 mins Heel taps at stairs 4" 20 reps alt  Going up stairs 1x, alt with 2HHA on rails Volleyball hits seated    PATIENT EDUCATION:  Education details: POC Person educated: Patient Education method: Explanation Education comprehension: verbalized understanding  HOME EXERCISE PROGRAM: TBD  ASSESSMENT:  CLINICAL IMPRESSION: We continued with more walking today with FWW, and CGA, down curbs occasional min A for walker, and going up curbs occasional mod A to get up the curb. Evaluated her L shoulder today and added goals. She is very limited with ROM and strength.    OBJECTIVE IMPAIRMENTS: Abnormal gait, cardiopulmonary status limiting activity, decreased activity tolerance, decreased balance, decreased coordination, decreased endurance, decreased mobility, difficulty walking, decreased ROM, decreased strength, impaired flexibility, impaired sensation, and pain.   REHAB POTENTIAL: Good  CLINICAL DECISION MAKING: Stable/uncomplicated  EVALUATION COMPLEXITY: Low   GOALS: Goals reviewed with patient? Yes  SHORT TERM GOALS: Target date: 12/02/22 Independent with initial HEP  Goal status: met 12/04/22  LONG TERM GOALS: Target date: 02/17/23  Independent with advanced HEP or gym program Goal status: INITIAL  2.  Decrease TUG time to 24 seconds Goal status:ongoing 12/16/22  3.  Walk 300 feet in 3 minute walk test Goal status: INITIAL  4.  Be able to negotiate stairs and curbs with SBA with one at a time and a handrail Goal status: 12/09/22  progressing  5.  Report able to warm up a meal without sitting Goal status: 12/11/22 progressing  6. Patient will demonstrate increase in shoulder ROM to 100d for flexion and abduction  Goal status: 85d flexion, 60d abd  7. Patient will improve L shoulder strength with MMT 4/5   Goal status: 2+ for flexion and 2- for abduction   PLAN:  PT FREQUENCY: 1-2x/week  PT DURATION: 12 weeks  PLANNED INTERVENTIONS: Therapeutic exercises, Therapeutic activity, Neuromuscular re-education, Balance training, Gait training, Patient/Family education, Self Care, Joint mobilization, and Manual therapy  PLAN FOR NEXT SESSION: add exercises, balance   Cassie Freer, PT 12/18/2022, 6:05 PM [facility] at [facility] [address] [facility] [place], [place], [address] Phone: [phone]   Fax:  [phone] at [facility] [address] [facility] [address] [place], [place], [address] Phone: [phone]   Fax:  [phone]

## 2022-12-23 ENCOUNTER — Ambulatory Visit: Payer: Medicare Other | Admitting: Physical Therapy

## 2022-12-23 ENCOUNTER — Encounter: Payer: Self-pay | Admitting: Physical Therapy

## 2022-12-23 DIAGNOSIS — R262 Difficulty in walking, not elsewhere classified: Secondary | ICD-10-CM | POA: Diagnosis not present

## 2022-12-23 DIAGNOSIS — R2681 Unsteadiness on feet: Secondary | ICD-10-CM

## 2022-12-23 DIAGNOSIS — M6281 Muscle weakness (generalized): Secondary | ICD-10-CM | POA: Diagnosis not present

## 2022-12-23 NOTE — Therapy (Signed)
OUTPATIENT PHYSICAL THERAPY LOWER EXTREMITY TREATMENT Progress Note Reporting Period 11/17/22 to 12/23/22  See note below for Objective Data and Assessment of Progress/Goals.      Patient Name: Pamela Snyder MRN: [id number] DOB:[birth year], 77 y.o., female Today's Date: 12/23/2022  END OF SESSION:  PT End of Session - 12/23/22 1649     Visit Number 10    Date for PT Re-Evaluation 02/17/23    Authorization Type Medicare    PT Start Time 1615    PT Stop Time 1704    PT Time Calculation (min) 49 min    Activity Tolerance Patient tolerated treatment well    Behavior During Therapy WFL for tasks assessed/performed             Past Medical History:  Diagnosis Date   Anxiety    Arthritis    "right foot; spine; hands" (11/23/2014)   Charcot foot due to diabetes mellitus (HCC)    Depression    GERD (gastroesophageal reflux disease)    Gout    Hyperlipemia    Hypertension    Migraine    hx   Neuropathy    Numbness    Thyroid goiter 1986   Type II diabetes mellitus (HCC)    Past Surgical History:  Procedure Laterality Date   ABDOMINAL HYSTERECTOMY  1999   w/BSO   ACHILLES TENDON LENGTHENING Right 11/23/2014   ACHILLES TENDON LENGTHENING Right 11/23/2014   Procedure: RIGHT ACHILLES PERCUTANEOUS TENDON LENGTHENING;  Surgeon: Toni Arthurs, MD;  Location: [facility];  Service: Orthopedics;  Laterality: Right;   APPENDECTOMY  1963   ARTHRODESIS Right 11/23/2014   mid foot   CARDIAC CATHETERIZATION  08/2004   FOOT ARTHRODESIS Right 11/23/2014   Procedure: RIGHT MID FOOT ARTHRODESIS;  Surgeon: Toni Arthurs, MD;  Location: [facility];  Service: Orthopedics;  Laterality: Right;   METATARSAL OSTEOTOMY Right 11/23/2014   Procedure: RIGHT MID FOOT OSTEOTOMY;  Surgeon: Toni Arthurs, MD;  Location: [facility];  Service: Orthopedics;  Laterality: Right;   ORIF ANKLE FRACTURE Left 05/16/2021   Procedure: OPEN REDUCTION INTERNAL FIXATION (ORIF) Left ankle lateral malleolus, possible deltoid ligament  repair;  Surgeon: Toni Arthurs, MD;  [facility]: [facility];  Service: Orthopedics;  Laterality: Left;   OSTEOTOMY Right 11/23/2014   mid foot   PARTIAL THYMECTOMY  1986   ? side   TONSILLECTOMY  1954   Patient Active Problem List   Diagnosis Date Noted   Closed low lateral malleolus fracture 05/16/2021   Gout 05/16/2021   Hyperlipemia 05/16/2021   Hypertension 05/16/2021   Type II diabetes mellitus (HCC) 05/16/2021   Spinal stenosis of lumbar region 03/10/2019   Cervical myelopathy (HCC) 03/10/2019   Peripheral neuropathy 02/02/2019   Gait abnormality 02/02/2019   Urinary urgency 02/02/2019   Chronic bilateral low back pain without sciatica 02/02/2019   DOE (dyspnea on exertion) 10/05/2017   Bruit of right carotid artery 10/05/2017   Coronary artery calcification seen on CT scan 10/05/2017   Chest pain 10/04/2017   Aortic atherosclerosis (HCC) 10/04/2017   Charcot foot due to diabetes mellitus (HCC) 11/23/2014    PCP: Jarrett Soho, PA  REFERRING PROVIDER: Delena Serve, PA  REFERRING DIAG: poor balance, weakness, difficulty walking  THERAPY DIAG:  Muscle weakness (generalized)  Unsteady gait  Difficulty in walking, not elsewhere classified  Rationale for Evaluation and Treatment: Rehabilitation  ONSET DATE: 11/03/22  SUBJECTIVE:    SUBJECTIVE STATEMENT:  Still with the back pain.  She reports that she is trired went out  to lunch and  PERTINENT HISTORY: See above PAIN:  Are you having pain? Yes: NPRS scale: 4/10 Pain location: low back Pain description: tight, stabbing Aggravating factors: being on feet longer, twisting pain pain up to 6/10 Relieving factors: rest pain will go to 0/10  PRECAUTIONS: Fall  WEIGHT BEARING RESTRICTIONS: No  FALLS:  Has patient fallen in last 6 months? No, just unsteady  LIVING ENVIRONMENT: Lives with: alone Lives in: House/apartment Stairs: Yes: Internal: 12 steps; can reach both Has following equipment at home: Walker - 2  wheeled  OCCUPATION: retired  PLOF: uses walker for household mobility, does her cooking and cleaning using a FWW  PATIENT GOALS: may go to the beach in the fall, attend women's group, feel stronger, more steady, stay living at home independently  NEXT MD VISIT: none scheduled  OBJECTIVE:   DIAGNOSTIC FINDINGS: none  COGNITION: Overall cognitive status: Within functional limits for tasks assessed     SENSATION: Mid shin down very limited sensation light touch can feel some deeper touch  EDEMA:  Very mild at the ankles  MUSCLE LENGTH: Mild tightness in the HS  POSTURE: rounded shoulders and forward head  PALPATION: Tight and tender in the lumbar paraspinals  LOWER EXTREMITY ROM:  Active ROM Right eval Left eval  Hip flexion 80 80  Hip extension    Hip abduction    Hip adduction    Hip internal rotation    Hip external rotation    Knee flexion    Knee extension 0 0  Ankle dorsiflexion 0 0  Ankle plantarflexion    Ankle inversion    Ankle eversion     (Blank rows = not tested)  LOWER EXTREMITY MMT:  MMT Right eval Left eval  Hip flexion 4 4-  Hip extension 4- 4-  Hip abduction 4 4-  Hip adduction    Hip internal rotation    Hip external rotation    Knee flexion 4 4-  Knee extension 4 3+  Ankle dorsiflexion 2 0  Ankle plantarflexion 1 1  Ankle inversion 1 0  Ankle eversion 0 0   (Blank rows = not tested) FUNCTIONAL TESTS:  TUG: 40 seconds with FWW at evaluation walk test with CGA and FWW = 200 feet at evaluation Unable to stand without holding onto something  GAIT: Distance walked: 200 feet Assistive device utilized: Walker - 2 wheeled Level of assistance: SBA to start and then needs CGA Comments: foot drop bilaterally, decreased foot control, some hip drop on the left side, when fatigued poor safety with transfers   TODAY'S TREATMENT:                                                                                                                               DATE:  12/23/22 Walking around the back parking [place] 1 rest break Side stepping Worked on active DF bilateral ankles 6" toe touches with walker alternating Standing with walker hip  abduction and extension STM to the lumbar , thoracic Seated volleyball  12/18/22 Walking around [place], 1 rest break in between  NuStep L5 x77mins  Shoulder assessment - L shoulder flexion 85d - L shoulder abd 60d - L ER 40d  - MMT flexion 2+, abd 2-  5# hip flexion tapping 6" step in RW   Shoulder flexion and abd 1# 2x10   12/16/22 Nustep level 5 x 5 minutes Gait outside total of about 700-800 feet with 2 rests CGA, some min A down curbs, Mod A at times coming up curb Volley ball in sitting Leg curls 25# Lats 25#  12/11/22 Gait from front door around back to door 2 moments of instability and increased left foot drop noted Nustep L 5 6 min Standing on airex facing mat then lifting hands off for 5 sec CG-Min A 10 x Side stepping at mat including stepping on and over airex 5 x each way- trouble with stepping on shoe Standing at elevated mat 1 hand on mat 1 HHA marching fwd and walking back 8 x STS from chair to elevated mat with UE use min A with cuing to push thru LE 2 sets 5    12/09/22 Gait outside half parking [place] rest and then around building to the front CGA/MinA for curbs Standing folding towels Volleyball seated Side stepping Lat pulls 20# Stairs with handrails step over step 25# leg curls 15# leg extension  12/04/22 Gait outside around 1/2 the parking [place] then sit and rest and then from there around to the front of the building Volley ball sitting Supine bridges Feet on ball small trunk rotation Isometric abs Hooklying clamshells green tband Supine green tband extension Isometric abs Getting up from supine had back pain, used theragun on lumbar pspinals Side stepping   PATIENT EDUCATION:  Education details: POC Person educated:  Patient Education method: Explanation Education comprehension: verbalized understanding  HOME EXERCISE PROGRAM: TBD  ASSESSMENT:  CLINICAL IMPRESSION: Patient was fatigued today as she went to lunch with a ladies group and stayed awhile, she did well with the walking, balance is very scary for her due to the neuropathy.  She has a little bit of active DF on the right, I tried to do some eccentrics with this take it higher than she can actively take it and have her hold, it fatigued very quickly  OBJECTIVE IMPAIRMENTS: Abnormal gait, cardiopulmonary status limiting activity, decreased activity tolerance, decreased balance, decreased coordination, decreased endurance, decreased mobility, difficulty walking, decreased ROM, decreased strength, impaired flexibility, impaired sensation, and pain.   REHAB POTENTIAL: Good  CLINICAL DECISION MAKING: Stable/uncomplicated  EVALUATION COMPLEXITY: Low   GOALS: Goals reviewed with patient? Yes  SHORT TERM GOALS: Target date: 12/02/22 Independent with initial HEP Goal status: met 12/04/22  LONG TERM GOALS: Target date: 02/17/23  Independent with advanced HEP or gym program Goal status: INITIAL  2.  Decrease TUG time to 24 seconds Goal status:ongoing 12/16/22  3.  Walk 300 feet in 3 minute walk test Goal status: ongoing 12/23/22  4.  Be able to negotiate stairs and curbs with SBA with one at a time and a handrail Goal status: 12/09/22 progressing  5.  Report able to warm up a meal without sitting Goal status: 12/11/22 progressing  6. Patient will demonstrate increase in shoulder ROM to 100d for flexion and abduction  Goal status: 85d flexion, 60d abd  7. Patient will improve L shoulder strength with MMT 4/5   Goal status: 2+ for flexion and  2- for abduction   PLAN:  PT FREQUENCY: 1-2x/week  PT DURATION: 12 weeks  PLANNED INTERVENTIONS: Therapeutic exercises, Therapeutic activity, Neuromuscular re-education, Balance training, Gait  training, Patient/Family education, Self Care, Joint mobilization, and Manual therapy  PLAN FOR NEXT SESSION: add exercises, balance   Jearld Lesch, PT 12/23/2022, 4:58 PM [facility] at [facility] [address] [place], [place], [address] Phone: [phone]   Fax:  [phone]

## 2022-12-25 ENCOUNTER — Ambulatory Visit: Payer: Medicare Other

## 2022-12-25 DIAGNOSIS — R2681 Unsteadiness on feet: Secondary | ICD-10-CM

## 2022-12-25 DIAGNOSIS — M6281 Muscle weakness (generalized): Secondary | ICD-10-CM

## 2022-12-25 DIAGNOSIS — R262 Difficulty in walking, not elsewhere classified: Secondary | ICD-10-CM

## 2022-12-25 NOTE — Therapy (Signed)
OUTPATIENT PHYSICAL THERAPY LOWER EXTREMITY TREATMENT    Patient Name: Pamela Snyder MRN: [id number] DOB:11-25-1945, 77 y.o., female Today's Date: 12/25/2022  END OF SESSION:  PT End of Session - 12/25/22 1713     Visit Number 11    Date for PT Re-Evaluation 02/17/23    Authorization Type Medicare    PT Start Time 1715    PT Stop Time 1800    PT Time Calculation (min) 45 min    Activity Tolerance Patient tolerated treatment well    Behavior During Therapy WFL for tasks assessed/performed              Past Medical History:  Diagnosis Date   Anxiety    Arthritis    "right foot; spine; hands" (11/23/2014)   Charcot foot due to diabetes mellitus (HCC)    Depression    GERD (gastroesophageal reflux disease)    Gout    Hyperlipemia    Hypertension    Migraine    hx   Neuropathy    Numbness    Thyroid goiter 1986   Type II diabetes mellitus (HCC)    Past Surgical History:  Procedure Laterality Date   ABDOMINAL HYSTERECTOMY  1999   w/BSO   ACHILLES TENDON LENGTHENING Right 11/23/2014   ACHILLES TENDON LENGTHENING Right 11/23/2014   Procedure: RIGHT ACHILLES PERCUTANEOUS TENDON LENGTHENING;  Surgeon: Toni Arthurs, MD;  Location: [facility];  Service: Orthopedics;  Laterality: Right;   APPENDECTOMY  1963   ARTHRODESIS Right 11/23/2014   mid foot   CARDIAC CATHETERIZATION  08/2004   FOOT ARTHRODESIS Right 11/23/2014   Procedure: RIGHT MID FOOT ARTHRODESIS;  Surgeon: Toni Arthurs, MD;  Location: [facility];  Service: Orthopedics;  Laterality: Right;   METATARSAL OSTEOTOMY Right 11/23/2014   Procedure: RIGHT MID FOOT OSTEOTOMY;  Surgeon: Toni Arthurs, MD;  Location: [facility];  Service: Orthopedics;  Laterality: Right;   ORIF ANKLE FRACTURE Left 05/16/2021   Procedure: OPEN REDUCTION INTERNAL FIXATION (ORIF) Left ankle lateral malleolus, possible deltoid ligament repair;  Surgeon: Toni Arthurs, MD;  Location: [facility];  Service: Orthopedics;  Laterality: Left;   OSTEOTOMY Right 11/23/2014    mid foot   PARTIAL THYMECTOMY  1986   ? side   TONSILLECTOMY  1954   Patient Active Problem List   Diagnosis Date Noted   Closed low lateral malleolus fracture 05/16/2021   Gout 05/16/2021   Hyperlipemia 05/16/2021   Hypertension 05/16/2021   Type II diabetes mellitus (HCC) 05/16/2021   Spinal stenosis of lumbar region 03/10/2019   Cervical myelopathy (HCC) 03/10/2019   Peripheral neuropathy 02/02/2019   Gait abnormality 02/02/2019   Urinary urgency 02/02/2019   Chronic bilateral low back pain without sciatica 02/02/2019   DOE (dyspnea on exertion) 10/05/2017   Bruit of right carotid artery 10/05/2017   Coronary artery calcification seen on CT scan 10/05/2017   Chest pain 10/04/2017   Aortic atherosclerosis (HCC) 10/04/2017   Charcot foot due to diabetes mellitus (HCC) 11/23/2014    PCP: Jarrett Soho, PA  REFERRING PROVIDER: Delena Serve, PA  REFERRING DIAG: poor balance, weakness, difficulty walking  THERAPY DIAG:  Muscle weakness (generalized)  Unsteady gait  Difficulty in walking, not elsewhere classified  Rationale for Evaluation and Treatment: Rehabilitation  ONSET DATE: 11/03/22  SUBJECTIVE:    SUBJECTIVE STATEMENT:  back pain not bad today, I took 2 Aleeve   PERTINENT HISTORY: See above PAIN:  Are you having pain? Yes: NPRS scale: 4/10 Pain location: low back Pain description: tight, stabbing  Aggravating factors: being on feet longer, twisting pain pain up to 6/10 Relieving factors: rest pain will go to 0/10  PRECAUTIONS: Fall  WEIGHT BEARING RESTRICTIONS: No  FALLS:  Has patient fallen in last 6 months? No, just unsteady  LIVING ENVIRONMENT: Lives with: alone Lives in: House/apartment Stairs: Yes: Internal: 12 steps; can reach both Has following equipment at home: Walker - 2 wheeled  OCCUPATION: retired  PLOF: uses walker for household mobility, does her cooking and cleaning using a FWW  PATIENT GOALS: may go to the beach in the fall,  attend women's group, feel stronger, more steady, stay living at home independently  NEXT MD VISIT: none scheduled  OBJECTIVE:   DIAGNOSTIC FINDINGS: none  COGNITION: Overall cognitive status: Within functional limits for tasks assessed     SENSATION: Mid shin down very limited sensation light touch can feel some deeper touch  EDEMA:  Very mild at the ankles  MUSCLE LENGTH: Mild tightness in the HS  POSTURE: rounded shoulders and forward head  PALPATION: Tight and tender in the lumbar paraspinals  LOWER EXTREMITY ROM:  Active ROM Right eval Left eval  Hip flexion 80 80  Hip extension    Hip abduction    Hip adduction    Hip internal rotation    Hip external rotation    Knee flexion    Knee extension 0 0  Ankle dorsiflexion 0 0  Ankle plantarflexion    Ankle inversion    Ankle eversion     (Blank rows = not tested)  LOWER EXTREMITY MMT:  MMT Right eval Left eval  Hip flexion 4 4-  Hip extension 4- 4-  Hip abduction 4 4-  Hip adduction    Hip internal rotation    Hip external rotation    Knee flexion 4 4-  Knee extension 4 3+  Ankle dorsiflexion 2 0  Ankle plantarflexion 1 1  Ankle inversion 1 0  Ankle eversion 0 0   (Blank rows = not tested) FUNCTIONAL TESTS:  TUG: 40 seconds with FWW at evaluation walk test with CGA and FWW = 200 feet at evaluation Unable to stand without holding onto something  GAIT: Distance walked: 200 feet Assistive device utilized: Walker - 2 wheeled Level of assistance: SBA to start and then needs CGA Comments: foot drop bilaterally, decreased foot control, some hip drop on the left side, when fatigued poor safety with transfers   TODAY'S TREATMENT:                                                                                                                              DATE:  12/25/22 NuStep L5 x71mins  Walk around back parking island  Chest press 20# 2x10 Lat pulls downs 25# 2x10 Seated rows with band  green 2x10  2# WaTe shoulder flexion 2x10 legs against mat table  STS on airex with RW in front 2x5   12/23/22 Walking around the back parking [place]  1 rest break Side stepping Worked on active DF bilateral ankles 6" toe touches with walker alternating Standing with walker hip abduction and extension STM to the lumbar , thoracic Seated volleyball  12/18/22 Walking around [place], 1 rest break in between  NuStep L5 x77mins  Shoulder assessment - L shoulder flexion 85d - L shoulder abd 60d - L ER 40d  - MMT flexion 2+, abd 2-  5# hip flexion tapping 6" step in RW   Shoulder flexion and abd 1# 2x10   12/16/22 Nustep level 5 x 5 minutes Gait outside total of about 700-800 feet with 2 rests CGA, some min A down curbs, Mod A at times coming up curb Volley ball in sitting Leg curls 25# Lats 25#  12/11/22 Gait from front door around back to door 2 moments of instability and increased left foot drop noted Nustep L 5 6 min Standing on airex facing mat then lifting hands off for 5 sec CG-Min A 10 x Side stepping at mat including stepping on and over airex 5 x each way- trouble with stepping on shoe Standing at elevated mat 1 hand on mat 1 HHA marching fwd and walking back 8 x STS from chair to elevated mat with UE use min A with cuing to push thru LE 2 sets 5    12/09/22 Gait outside half parking [place] rest and then around building to the front CGA/MinA for curbs Standing folding towels Volleyball seated Side stepping Lat pulls 20# Stairs with handrails step over step 25# leg curls 15# leg extension  12/04/22 Gait outside around 1/2 the parking [place] then sit and rest and then from there around to the front of the building Volley ball sitting Supine bridges Feet on ball small trunk rotation Isometric abs Hooklying clamshells green tband Supine green tband extension Isometric abs Getting up from supine had back pain, used theragun on lumbar pspinals Side  stepping   PATIENT EDUCATION:  Education details: POC Person educated: Patient Education method: Explanation Education comprehension: verbalized understanding  HOME EXERCISE PROGRAM: TBD  ASSESSMENT:  CLINICAL IMPRESSION: Patient doing well, we worked on some shoulder and upper back strengthening and then some balance and standing activities. Unable to stay upright without legs hitting table for support.   OBJECTIVE IMPAIRMENTS: Abnormal gait, cardiopulmonary status limiting activity, decreased activity tolerance, decreased balance, decreased coordination, decreased endurance, decreased mobility, difficulty walking, decreased ROM, decreased strength, impaired flexibility, impaired sensation, and pain.   REHAB POTENTIAL: Good  CLINICAL DECISION MAKING: Stable/uncomplicated  EVALUATION COMPLEXITY: Low   GOALS: Goals reviewed with patient? Yes  SHORT TERM GOALS: Target date: 12/02/22 Independent with initial HEP Goal status: met 12/04/22  LONG TERM GOALS: Target date: 02/17/23  Independent with advanced HEP or gym program Goal status: INITIAL  2.  Decrease TUG time to 24 seconds Goal status:ongoing 12/16/22  3.  Walk 300 feet in 3 minute walk test Goal status: ongoing 12/23/22  4.  Be able to negotiate stairs and curbs with SBA with one at a time and a handrail Goal status: 12/09/22 progressing  5.  Report able to warm up a meal without sitting Goal status: 12/11/22 progressing  6. Patient will demonstrate increase in shoulder ROM to 100d for flexion and abduction  Goal status: 85d flexion, 60d abd  7. Patient will improve L shoulder strength with MMT 4/5   Goal status: 2+ for flexion and 2- for abduction   PLAN:  PT FREQUENCY: 1-2x/week  PT DURATION: 12 weeks  PLANNED INTERVENTIONS:  Therapeutic exercises, Therapeutic activity, Neuromuscular re-education, Balance training, Gait training, Patient/Family education, Self Care, Joint mobilization, and Manual  therapy  PLAN FOR NEXT SESSION: add exercises, balance   Cassie Freer, PT 12/25/2022, 6:01 PM [facility] at [facility] [address] [facility]. [place], [place], [address] Phone: [phone]   Fax:  [phone]

## 2022-12-30 ENCOUNTER — Encounter: Payer: Self-pay | Admitting: Physical Therapy

## 2022-12-30 ENCOUNTER — Ambulatory Visit: Payer: Medicare Other | Admitting: Physical Therapy

## 2022-12-30 DIAGNOSIS — M6281 Muscle weakness (generalized): Secondary | ICD-10-CM | POA: Diagnosis not present

## 2022-12-30 DIAGNOSIS — R2681 Unsteadiness on feet: Secondary | ICD-10-CM | POA: Diagnosis not present

## 2022-12-30 DIAGNOSIS — R262 Difficulty in walking, not elsewhere classified: Secondary | ICD-10-CM

## 2022-12-30 NOTE — Therapy (Signed)
OUTPATIENT PHYSICAL THERAPY LOWER EXTREMITY TREATMENT    Patient Name: Pamela Snyder MRN: [id number] DOB:1945/10/07, 77 y.o., female Today's Date: 12/30/2022  END OF SESSION:  PT End of Session - 12/30/22 1601     Visit Number 12    Date for PT Re-Evaluation 02/17/23    Authorization Type Medicare    PT Start Time 1602    PT Stop Time 1700    PT Time Calculation (min) 58 min    Activity Tolerance Patient tolerated treatment well    Behavior During Therapy WFL for tasks assessed/performed              Past Medical History:  Diagnosis Date   Anxiety    Arthritis    "right foot; spine; hands" (11/23/2014)   Charcot foot due to diabetes mellitus (HCC)    Depression    GERD (gastroesophageal reflux disease)    Gout    Hyperlipemia    Hypertension    Migraine    hx   Neuropathy    Numbness    Thyroid goiter 1986   Type II diabetes mellitus (HCC)    Past Surgical History:  Procedure Laterality Date   ABDOMINAL HYSTERECTOMY  1999   w/BSO   ACHILLES TENDON LENGTHENING Right 11/23/2014   ACHILLES TENDON LENGTHENING Right 11/23/2014   Procedure: RIGHT ACHILLES PERCUTANEOUS TENDON LENGTHENING;  Surgeon: Toni Arthurs, MD;  Location: [facility];  Service: Orthopedics;  Laterality: Right;   APPENDECTOMY  1963   ARTHRODESIS Right 11/23/2014   mid foot   CARDIAC CATHETERIZATION  08/2004   FOOT ARTHRODESIS Right 11/23/2014   Procedure: RIGHT MID FOOT ARTHRODESIS;  Surgeon: Toni Arthurs, MD;  Location: [facility];  Service: Orthopedics;  Laterality: Right;   METATARSAL OSTEOTOMY Right 11/23/2014   Procedure: RIGHT MID FOOT OSTEOTOMY;  Surgeon: Toni Arthurs, MD;  Location: [facility];  Service: Orthopedics;  Laterality: Right;   ORIF ANKLE FRACTURE Left 05/16/2021   Procedure: OPEN REDUCTION INTERNAL FIXATION (ORIF) Left ankle lateral malleolus, possible deltoid ligament repair;  Surgeon: Toni Arthurs, MD;  Location: [facility];  Service: Orthopedics;  Laterality: Left;   OSTEOTOMY Right 11/23/2014    mid foot   PARTIAL THYMECTOMY  1986   ? side   TONSILLECTOMY  1954   Patient Active Problem List   Diagnosis Date Noted   Closed low lateral malleolus fracture 05/16/2021   Gout 05/16/2021   Hyperlipemia 05/16/2021   Hypertension 05/16/2021   Type II diabetes mellitus (HCC) 05/16/2021   Spinal stenosis of lumbar region 03/10/2019   Cervical myelopathy (HCC) 03/10/2019   Peripheral neuropathy 02/02/2019   Gait abnormality 02/02/2019   Urinary urgency 02/02/2019   Chronic bilateral low back pain without sciatica 02/02/2019   DOE (dyspnea on exertion) 10/05/2017   Bruit of right carotid artery 10/05/2017   Coronary artery calcification seen on CT scan 10/05/2017   Chest pain 10/04/2017   Aortic atherosclerosis (HCC) 10/04/2017   Charcot foot due to diabetes mellitus (HCC) 11/23/2014    PCP: Jarrett Soho, PA  REFERRING PROVIDER: Delena Serve, PA  REFERRING DIAG: poor balance, weakness, difficulty walking  THERAPY DIAG:  Muscle weakness (generalized)  Unsteady gait  Difficulty in walking, not elsewhere classified  Rationale for Evaluation and Treatment: Rehabilitation  ONSET DATE: 11/03/22  SUBJECTIVE:    SUBJECTIVE STATEMENT:  Doing okay, I feel weak especially the legs   PERTINENT HISTORY: See above PAIN:  Are you having pain? Yes: NPRS scale: 3/10 Pain location: low back Pain description: tight, stabbing Aggravating  factors: being on feet longer, twisting pain pain up to 6/10 Relieving factors: rest pain will go to 0/10  PRECAUTIONS: Fall  WEIGHT BEARING RESTRICTIONS: No  FALLS:  Has patient fallen in last 6 months? No, just unsteady  LIVING ENVIRONMENT: Lives with: alone Lives in: House/apartment Stairs: Yes: Internal: 12 steps; can reach both Has following equipment at home: Walker - 2 wheeled  OCCUPATION: retired  PLOF: uses walker for household mobility, does her cooking and cleaning using a FWW  PATIENT GOALS: may go to the beach in the  fall, attend women's group, feel stronger, more steady, stay living at home independently  NEXT MD VISIT: none scheduled  OBJECTIVE:   DIAGNOSTIC FINDINGS: none  COGNITION: Overall cognitive status: Within functional limits for tasks assessed     SENSATION: Mid shin down very limited sensation light touch can feel some deeper touch  EDEMA:  Very mild at the ankles  MUSCLE LENGTH: Mild tightness in the HS  POSTURE: rounded shoulders and forward head  PALPATION: Tight and tender in the lumbar paraspinals  LOWER EXTREMITY ROM:  Active ROM Right eval Left eval  Hip flexion 80 80  Hip extension    Hip abduction    Hip adduction    Hip internal rotation    Hip external rotation    Knee flexion    Knee extension 0 0  Ankle dorsiflexion 0 0  Ankle plantarflexion    Ankle inversion    Ankle eversion     (Blank rows = not tested)  LOWER EXTREMITY MMT:  MMT Right eval Left eval  Hip flexion 4 4-  Hip extension 4- 4-  Hip abduction 4 4-  Hip adduction    Hip internal rotation    Hip external rotation    Knee flexion 4 4-  Knee extension 4 3+  Ankle dorsiflexion 2 0  Ankle plantarflexion 1 1  Ankle inversion 1 0  Ankle eversion 0 0   (Blank rows = not tested) FUNCTIONAL TESTS:  TUG: 40 seconds with FWW at evaluation walk test with CGA and FWW = 200 feet at evaluation Unable to stand without holding onto something  GAIT: Distance walked: 200 feet Assistive device utilized: Environmental consultant - 2 wheeled Level of assistance: SBA to start and then needs CGA Comments: foot drop bilaterally, decreased foot control, some hip drop on the left side, when fatigued poor safety with transfers   TODAY'S TREATMENT:                                                                                                                              DATE:  12/30/22 Gait outside half the parking [place], sit and rest and then back in, CGA for the curbs Nustep level 5 x 7 minutes Leg  press 40# both legs 2x10, then 20# single legs decreased ROM the left leg is very weak Leg curls 25# 2x10, single legs 15# ont he right and 10# on  the left Leg extension 10# 2x10, right leg 10# x10, left leg 5# 2x6 2# hip abduction 2x10 2# marching  12/25/22 NuStep L5 x49mins  Walk around back parking island  Chest press 20# 2x10 Lat pulls downs 25# 2x10 Seated rows with band green 2x10  2# WaTe shoulder flexion 2x10 legs against mat table  STS on airex with RW in front 2x5   12/23/22 Walking around the back parking [place] 1 rest break Side stepping Worked on active DF bilateral ankles 6" toe touches with walker alternating Standing with walker hip abduction and extension STM to the lumbar , thoracic Seated volleyball  12/18/22 Walking around [place], 1 rest break in between  NuStep L5 x61mins  Shoulder assessment - L shoulder flexion 85d - L shoulder abd 60d - L ER 40d  - MMT flexion 2+, abd 2-  5# hip flexion tapping 6" step in RW   Shoulder flexion and abd 1# 2x10   12/16/22 Nustep level 5 x 5 minutes Gait outside total of about 700-800 feet with 2 rests CGA, some min A down curbs, Mod A at times coming up curb Volley ball in sitting Leg curls 25# Lats 25#  12/11/22 Gait from front door around back to door 2 moments of instability and increased left foot drop noted Nustep L 5 6 min Standing on airex facing mat then lifting hands off for 5 sec CG-Min A 10 x Side stepping at mat including stepping on and over airex 5 x each way- trouble with stepping on shoe Standing at elevated mat 1 hand on mat 1 HHA marching fwd and walking back 8 x STS from chair to elevated mat with UE use min A with cuing to push thru LE 2 sets 5    12/09/22 Gait outside half parking [place] rest and then around building to the front CGA/MinA for curbs Standing folding towels Volleyball seated Side stepping Lat pulls 20# Stairs with handrails step over step 25# leg curls 15# leg  extension  12/04/22 Gait outside around 1/2 the parking [place] then sit and rest and then from there around to the front of the building Volley ball sitting Supine bridges Feet on ball small trunk rotation Isometric abs Hooklying clamshells green tband Supine green tband extension Isometric abs Getting up from supine had back pain, used theragun on lumbar pspinals Side stepping   PATIENT EDUCATION:  Education details: POC Person educated: Patient Education method: Explanation Education comprehension: verbalized understanding  HOME EXERCISE PROGRAM: TBD  ASSESSMENT:  CLINICAL IMPRESSION: Patient reports that her legs feel weak and wants to work on this today, it really appears that the left leg is much weaker than the right as noted on the weight machines above.  I tried to have her do single legs today and this was very difficult with the left leg usually needing to decrease the weight  OBJECTIVE IMPAIRMENTS: Abnormal gait, cardiopulmonary status limiting activity, decreased activity tolerance, decreased balance, decreased coordination, decreased endurance, decreased mobility, difficulty walking, decreased ROM, decreased strength, impaired flexibility, impaired sensation, and pain.   REHAB POTENTIAL: Good  CLINICAL DECISION MAKING: Stable/uncomplicated  EVALUATION COMPLEXITY: Low   GOALS: Goals reviewed with patient? Yes  SHORT TERM GOALS: Target date: 12/02/22 Independent with initial HEP Goal status: met 12/04/22  LONG TERM GOALS: Target date: 02/17/23  Independent with advanced HEP or gym program Goal status: INITIAL  2.  Decrease TUG time to 24 seconds Goal status:ongoing 12/16/22  3.  Walk 300 feet in 3 minute walk  test Goal status: ongoing 12/23/22  4.  Be able to negotiate stairs and curbs with SBA with one at a time and a handrail Goal status: 12/09/22 progressing  5.  Report able to warm up a meal without sitting Goal status: 12/11/22 progressing  6.  Patient will demonstrate increase in shoulder ROM to 100d for flexion and abduction  Goal status: 85d flexion, 60d abd  7. Patient will improve L shoulder strength with MMT 4/5   Goal status: 2+ for flexion and 2- for abduction   PLAN:  PT FREQUENCY: 1-2x/week  PT DURATION: 12 weeks  PLANNED INTERVENTIONS: Therapeutic exercises, Therapeutic activity, Neuromuscular re-education, Balance training, Gait training, Patient/Family education, Self Care, Joint mobilization, and Manual therapy  PLAN FOR NEXT SESSION: add exercises, balance   Jearld Lesch, PT 12/30/2022, 4:02 PM [facility] at [facility] [address] [facility]. [place], [place], [address] Phone: [phone]   Fax:  [phone]

## 2023-01-01 ENCOUNTER — Encounter: Payer: Self-pay | Admitting: Physical Therapy

## 2023-01-01 ENCOUNTER — Ambulatory Visit: Payer: Medicare Other | Admitting: Physical Therapy

## 2023-01-01 DIAGNOSIS — R2681 Unsteadiness on feet: Secondary | ICD-10-CM | POA: Diagnosis not present

## 2023-01-01 DIAGNOSIS — M6281 Muscle weakness (generalized): Secondary | ICD-10-CM | POA: Diagnosis not present

## 2023-01-01 DIAGNOSIS — R262 Difficulty in walking, not elsewhere classified: Secondary | ICD-10-CM | POA: Diagnosis not present

## 2023-01-01 NOTE — Therapy (Signed)
OUTPATIENT PHYSICAL THERAPY LOWER EXTREMITY TREATMENT    Patient Name: Pamela Snyder MRN: [id number] DOB:1945-08-20, 77 y.o., female Today's Date: 01/01/2023  END OF SESSION:  PT End of Session - 01/01/23 1744     Visit Number 13    Date for PT Re-Evaluation 02/17/23    Authorization Type Medicare    PT Start Time 1741    PT Stop Time 1830    PT Time Calculation (min) 49 min    Activity Tolerance Patient tolerated treatment well    Behavior During Therapy WFL for tasks assessed/performed              Past Medical History:  Diagnosis Date   Anxiety    Arthritis    "right foot; spine; hands" (11/23/2014)   Charcot foot due to diabetes mellitus (HCC)    Depression    GERD (gastroesophageal reflux disease)    Gout    Hyperlipemia    Hypertension    Migraine    hx   Neuropathy    Numbness    Thyroid goiter 1986   Type II diabetes mellitus (HCC)    Past Surgical History:  Procedure Laterality Date   ABDOMINAL HYSTERECTOMY  1999   w/BSO   ACHILLES TENDON LENGTHENING Right 11/23/2014   ACHILLES TENDON LENGTHENING Right 11/23/2014   Procedure: RIGHT ACHILLES PERCUTANEOUS TENDON LENGTHENING;  Surgeon: Toni Arthurs, MD;  Location: [facility];  Service: Orthopedics;  Laterality: Right;   APPENDECTOMY  1963   ARTHRODESIS Right 11/23/2014   mid foot   CARDIAC CATHETERIZATION  08/2004   FOOT ARTHRODESIS Right 11/23/2014   Procedure: RIGHT MID FOOT ARTHRODESIS;  Surgeon: Toni Arthurs, MD;  Location: [facility];  Service: Orthopedics;  Laterality: Right;   METATARSAL OSTEOTOMY Right 11/23/2014   Procedure: RIGHT MID FOOT OSTEOTOMY;  Surgeon: Toni Arthurs, MD;  Location: [facility];  Service: Orthopedics;  Laterality: Right;   ORIF ANKLE FRACTURE Left 05/16/2021   Procedure: OPEN REDUCTION INTERNAL FIXATION (ORIF) Left ankle lateral malleolus, possible deltoid ligament repair;  Surgeon: Toni Arthurs, MD;  Location: [facility];  Service: Orthopedics;  Laterality: Left;   OSTEOTOMY Right 11/23/2014    mid foot   PARTIAL THYMECTOMY  1986   ? side   TONSILLECTOMY  1954   Patient Active Problem List   Diagnosis Date Noted   Closed low lateral malleolus fracture 05/16/2021   Gout 05/16/2021   Hyperlipemia 05/16/2021   Hypertension 05/16/2021   Type II diabetes mellitus (HCC) 05/16/2021   Spinal stenosis of lumbar region 03/10/2019   Cervical myelopathy (HCC) 03/10/2019   Peripheral neuropathy 02/02/2019   Gait abnormality 02/02/2019   Urinary urgency 02/02/2019   Chronic bilateral low back pain without sciatica 02/02/2019   DOE (dyspnea on exertion) 10/05/2017   Bruit of right carotid artery 10/05/2017   Coronary artery calcification seen on CT scan 10/05/2017   Chest pain 10/04/2017   Aortic atherosclerosis (HCC) 10/04/2017   Charcot foot due to diabetes mellitus (HCC) 11/23/2014    PCP: Jarrett Soho, PA  REFERRING PROVIDER: Delena Serve, PA  REFERRING DIAG: poor balance, weakness, difficulty walking  THERAPY DIAG:  Muscle weakness (generalized)  Unsteady gait  Difficulty in walking, not elsewhere classified  Rationale for Evaluation and Treatment: Rehabilitation  ONSET DATE: 11/03/22  SUBJECTIVE:    SUBJECTIVE STATEMENT:  very tired today, I feel like I am already exhausted PERTINENT HISTORY: See above PAIN:  Are you having pain? Yes: NPRS scale: 3/10 Pain location: low back Pain description: tight, stabbing Aggravating  factors: being on feet longer, twisting pain pain up to 6/10 Relieving factors: rest pain will go to 0/10  PRECAUTIONS: Fall  WEIGHT BEARING RESTRICTIONS: No  FALLS:  Has patient fallen in last 6 months? No, just unsteady  LIVING ENVIRONMENT: Lives with: alone Lives in: House/apartment Stairs: Yes: Internal: 12 steps; can reach both Has following equipment at home: Walker - 2 wheeled  OCCUPATION: retired  PLOF: uses walker for household mobility, does her cooking and cleaning using a FWW  PATIENT GOALS: may go to the beach in  the fall, attend women's group, feel stronger, more steady, stay living at home independently  NEXT MD VISIT: none scheduled  OBJECTIVE:   DIAGNOSTIC FINDINGS: none  COGNITION: Overall cognitive status: Within functional limits for tasks assessed     SENSATION: Mid shin down very limited sensation light touch can feel some deeper touch  EDEMA:  Very mild at the ankles  MUSCLE LENGTH: Mild tightness in the HS  POSTURE: rounded shoulders and forward head  PALPATION: Tight and tender in the lumbar paraspinals  LOWER EXTREMITY ROM:  Active ROM Right eval Left eval  Hip flexion 80 80  Hip extension    Hip abduction    Hip adduction    Hip internal rotation    Hip external rotation    Knee flexion    Knee extension 0 0  Ankle dorsiflexion 0 0  Ankle plantarflexion    Ankle inversion    Ankle eversion     (Blank rows = not tested)  LOWER EXTREMITY MMT:  MMT Right eval Left eval  Hip flexion 4 4-  Hip extension 4- 4-  Hip abduction 4 4-  Hip adduction    Hip internal rotation    Hip external rotation    Knee flexion 4 4-  Knee extension 4 3+  Ankle dorsiflexion 2 0  Ankle plantarflexion 1 1  Ankle inversion 1 0  Ankle eversion 0 0   (Blank rows = not tested) FUNCTIONAL TESTS:  TUG: 40 seconds with FWW at evaluation walk test with CGA and FWW = 200 feet at evaluation Unable to stand without holding onto something  GAIT: Distance walked: 200 feet Assistive device utilized: Environmental consultant - 2 wheeled Level of assistance: SBA to start and then needs CGA Comments: foot drop bilaterally, decreased foot control, some hip drop on the left side, when fatigued poor safety with transfers   TODAY'S TREATMENT:                                                                                                                              DATE:  01/01/23 Nustep level 5 x 5 minutes Gait outside with SBA x 250 feet and then 300 feet, CGA/Min A for curbs due to the  difficulty she has with placing the walker and then with stepping up the curb 25# triceps with Mod A for balance 5# biceps with Mod A for balance Lats 20#  Chest press 10# 2x10 25# leg curls 3x10 15# leg extension 3x10 Seated volleyball  12/30/22 Gait outside half the parking [place], sit and rest and then back in, CGA for the curbs Nustep level 5 x 7 minutes Leg press 40# both legs 2x10, then 20# single legs decreased ROM the left leg is very weak Leg curls 25# 2x10, single legs 15# ont he right and 10# on the left Leg extension 10# 2x10, right leg 10# x10, left leg 5# 2x6 2# hip abduction 2x10 2# marching  12/25/22 NuStep L5 x56mins  Walk around back parking island  Chest press 20# 2x10 Lat pulls downs 25# 2x10 Seated rows with band green 2x10  2# WaTe shoulder flexion 2x10 legs against mat table  STS on airex with RW in front 2x5   12/23/22 Walking around the back parking [place] 1 rest break Side stepping Worked on active DF bilateral ankles 6" toe touches with walker alternating Standing with walker hip abduction and extension STM to the lumbar , thoracic Seated volleyball  12/18/22 Walking around [place], 1 rest break in between  NuStep L5 x39mins  Shoulder assessment - L shoulder flexion 85d - L shoulder abd 60d - L ER 40d  - MMT flexion 2+, abd 2-  5# hip flexion tapping 6" step in RW   Shoulder flexion and abd 1# 2x10   12/16/22 Nustep level 5 x 5 minutes Gait outside total of about 700-800 feet with 2 rests CGA, some min A down curbs, Mod A at times coming up curb Volley ball in sitting Leg curls 25# Lats 25#  12/11/22 Gait from front door around back to door 2 moments of instability and increased left foot drop noted Nustep L 5 6 min Standing on airex facing mat then lifting hands off for 5 sec CG-Min A 10 x Side stepping at mat including stepping on and over airex 5 x each way- trouble with stepping on shoe Standing at elevated mat 1 hand on mat 1 HHA  marching fwd and walking back 8 x STS from chair to elevated mat with UE use min A with cuing to push thru LE 2 sets 5  PATIENT EDUCATION:  Education details: POC Person educated: Patient Education method: Explanation Education comprehension: verbalized understanding  HOME EXERCISE PROGRAM: TBD  ASSESSMENT:  CLINICAL IMPRESSION: Patient very fatigued by the end of the session, we have done the leg press and some supine exercises in the past and these seem to increase LBP.  She however is very weak on the left leg so we may need to try single leg activities, I did some standing arm exercises and to do this she needs mod A for balance due to neuropathy.  OBJECTIVE IMPAIRMENTS: Abnormal gait, cardiopulmonary status limiting activity, decreased activity tolerance, decreased balance, decreased coordination, decreased endurance, decreased mobility, difficulty walking, decreased ROM, decreased strength, impaired flexibility, impaired sensation, and pain.   REHAB POTENTIAL: Good  CLINICAL DECISION MAKING: Stable/uncomplicated  EVALUATION COMPLEXITY: Low   GOALS: Goals reviewed with patient? Yes  SHORT TERM GOALS: Target date: 12/02/22 Independent with initial HEP Goal status: met 12/04/22  LONG TERM GOALS: Target date: 02/17/23  Independent with advanced HEP or gym program Goal status: INITIAL  2.  Decrease TUG time to 24 seconds Goal status:ongoing 12/16/22  3.  Walk 300 feet in 3 minute walk test Goal status: ongoing 12/23/22  4.  Be able to negotiate stairs and curbs with SBA with one at a time and a handrail Goal status:  progressing 01/01/23  5.  Report able to warm up a meal without sitting Goal status: 12/11/22 progressing  6. Patient will demonstrate increase in shoulder ROM to 100d for flexion and abduction  Goal status: 85d flexion, 60d abd  7. Patient will improve L shoulder strength with MMT 4/5   Goal status: 2+ for flexion and 2- for abduction   PLAN:  PT  FREQUENCY: 1-2x/week  PT DURATION: 12 weeks  PLANNED INTERVENTIONS: Therapeutic exercises, Therapeutic activity, Neuromuscular re-education, Balance training, Gait training, Patient/Family education, Self Care, Joint mobilization, and Manual therapy  PLAN FOR NEXT SESSION: continue to add exercises for strength function and balance   Jearld Lesch, PT 01/01/2023, 5:44 PM [facility] at [facility] [address] [facility] [place], [place], [address] Phone: [phone]   Fax:  [phone]

## 2023-01-05 NOTE — Therapy (Signed)
OUTPATIENT PHYSICAL THERAPY LOWER EXTREMITY TREATMENT    Patient Name: Pamela Snyder MRN: [id number] DOB:11-Oct-1945, 77 y.o., female Today's Date: 01/06/2023  END OF SESSION:  PT End of Session - 01/06/23 1639     Visit Number 14    Date for PT Re-Evaluation 02/17/23    Authorization Type Medicare    PT Start Time 1640    PT Stop Time 1717    PT Time Calculation (min) 37 min    Activity Tolerance Patient tolerated treatment well    Behavior During Therapy WFL for tasks assessed/performed              Past Medical History:  Diagnosis Date   Anxiety    Arthritis    "right foot; spine; hands" (11/23/2014)   Charcot foot due to diabetes mellitus (HCC)    Depression    GERD (gastroesophageal reflux disease)    Gout    Hyperlipemia    Hypertension    Migraine    hx   Neuropathy    Numbness    Thyroid goiter 1986   Type II diabetes mellitus (HCC)    Past Surgical History:  Procedure Laterality Date   ABDOMINAL HYSTERECTOMY  1999   w/BSO   ACHILLES TENDON LENGTHENING Right 11/23/2014   ACHILLES TENDON LENGTHENING Right 11/23/2014   Procedure: RIGHT ACHILLES PERCUTANEOUS TENDON LENGTHENING;  Surgeon: Toni Arthurs, MD;  Location: [facility];  Service: Orthopedics;  Laterality: Right;   APPENDECTOMY  1963   ARTHRODESIS Right 11/23/2014   mid foot   CARDIAC CATHETERIZATION  08/2004   FOOT ARTHRODESIS Right 11/23/2014   Procedure: RIGHT MID FOOT ARTHRODESIS;  Surgeon: Toni Arthurs, MD;  Location: [facility];  Service: Orthopedics;  Laterality: Right;   METATARSAL OSTEOTOMY Right 11/23/2014   Procedure: RIGHT MID FOOT OSTEOTOMY;  Surgeon: Toni Arthurs, MD;  Location: [facility];  Service: Orthopedics;  Laterality: Right;   ORIF ANKLE FRACTURE Left 05/16/2021   Procedure: OPEN REDUCTION INTERNAL FIXATION (ORIF) Left ankle lateral malleolus, possible deltoid ligament repair;  Surgeon: Toni Arthurs, MD;  Location: [facility];  Service: Orthopedics;  Laterality: Left;   OSTEOTOMY Right 11/23/2014    mid foot   PARTIAL THYMECTOMY  1986   ? side   TONSILLECTOMY  1954   Patient Active Problem List   Diagnosis Date Noted   Closed low lateral malleolus fracture 05/16/2021   Gout 05/16/2021   Hyperlipemia 05/16/2021   Hypertension 05/16/2021   Type II diabetes mellitus (HCC) 05/16/2021   Spinal stenosis of lumbar region 03/10/2019   Cervical myelopathy (HCC) 03/10/2019   Peripheral neuropathy 02/02/2019   Gait abnormality 02/02/2019   Urinary urgency 02/02/2019   Chronic bilateral low back pain without sciatica 02/02/2019   DOE (dyspnea on exertion) 10/05/2017   Bruit of right carotid artery 10/05/2017   Coronary artery calcification seen on CT scan 10/05/2017   Chest pain 10/04/2017   Aortic atherosclerosis (HCC) 10/04/2017   Charcot foot due to diabetes mellitus (HCC) 11/23/2014    PCP: Jarrett Soho, PA  REFERRING PROVIDER: Delena Serve, PA  REFERRING DIAG: poor balance, weakness, difficulty walking  THERAPY DIAG:  Muscle weakness (generalized)  Unsteady gait  Difficulty in walking, not elsewhere classified  Rationale for Evaluation and Treatment: Rehabilitation  ONSET DATE: 11/03/22  SUBJECTIVE:    SUBJECTIVE STATEMENT:  doing fine, I fell asleep so that is why I am late.    PERTINENT HISTORY: See above PAIN:  Are you having pain? Yes: NPRS scale: 3/10 Pain location: low back  Pain description: tight, stabbing Aggravating factors: being on feet longer, twisting pain pain up to 6/10 Relieving factors: rest pain will go to 0/10  PRECAUTIONS: Fall  WEIGHT BEARING RESTRICTIONS: No  FALLS:  Has patient fallen in last 6 months? No, just unsteady  LIVING ENVIRONMENT: Lives with: alone Lives in: House/apartment Stairs: Yes: Internal: 12 steps; can reach both Has following equipment at home: Walker - 2 wheeled  OCCUPATION: retired  PLOF: uses walker for household mobility, does her cooking and cleaning using a FWW  PATIENT GOALS: may go to the beach in  the fall, attend women's group, feel stronger, more steady, stay living at home independently  NEXT MD VISIT: none scheduled  OBJECTIVE:   DIAGNOSTIC FINDINGS: none  COGNITION: Overall cognitive status: Within functional limits for tasks assessed     SENSATION: Mid shin down very limited sensation light touch can feel some deeper touch  EDEMA:  Very mild at the ankles  MUSCLE LENGTH: Mild tightness in the HS  POSTURE: rounded shoulders and forward head  PALPATION: Tight and tender in the lumbar paraspinals  LOWER EXTREMITY ROM:  Active ROM Right eval Left eval  Hip flexion 80 80  Hip extension    Hip abduction    Hip adduction    Hip internal rotation    Hip external rotation    Knee flexion    Knee extension 0 0  Ankle dorsiflexion 0 0  Ankle plantarflexion    Ankle inversion    Ankle eversion     (Blank rows = not tested)  LOWER EXTREMITY MMT:  MMT Right eval Left eval  Hip flexion 4 4-  Hip extension 4- 4-  Hip abduction 4 4-  Hip adduction    Hip internal rotation    Hip external rotation    Knee flexion 4 4-  Knee extension 4 3+  Ankle dorsiflexion 2 0  Ankle plantarflexion 1 1  Ankle inversion 1 0  Ankle eversion 0 0   (Blank rows = not tested) FUNCTIONAL TESTS:  TUG: 40 seconds with FWW at evaluation walk test with CGA and FWW = 200 feet at evaluation Unable to stand without holding onto something  GAIT: Distance walked: 200 feet Assistive device utilized: Environmental consultant - 2 wheeled Level of assistance: SBA to start and then needs CGA Comments: foot drop bilaterally, decreased foot control, some hip drop on the left side, when fatigued poor safety with transfers   TODAY'S TREATMENT:                                                                                                                              DATE:  01/06/23 NuStep L5 x41mins  Gait outdoors half the parking [place], sit and rest and then back in, CGA for the curbs Standing  against mat table rows and ext red 2x10 Standing with RW heel taps 6"  Steps 4" OHP red ball Leg ext 20# 2x10  HS curls  25# 2x10  01/01/23 Nustep level 5 x 5 minutes Gait outside with SBA x 250 feet and then 300 feet, CGA/Min A for curbs due to the difficulty she has with placing the walker and then with stepping up the curb 25# triceps with Mod A for balance 5# biceps with Mod A for balance Lats 20# Chest press 10# 2x10 25# leg curls 3x10 15# leg extension 3x10 Seated volleyball  12/30/22 Gait outside half the parking [place], sit and rest and then back in, CGA for the curbs Nustep level 5 x 7 minutes Leg press 40# both legs 2x10, then 20# single legs decreased ROM the left leg is very weak Leg curls 25# 2x10, single legs 15# ont he right and 10# on the left Leg extension 10# 2x10, right leg 10# x10, left leg 5# 2x6 2# hip abduction 2x10 2# marching  12/25/22 NuStep L5 x39mins  Walk around back parking island  Chest press 20# 2x10 Lat pulls downs 25# 2x10 Seated rows with band green 2x10  2# WaTe shoulder flexion 2x10 legs against mat table  STS on airex with RW in front 2x5   12/23/22 Walking around the back parking [place] 1 rest break Side stepping Worked on active DF bilateral ankles 6" toe touches with walker alternating Standing with walker hip abduction and extension STM to the lumbar , thoracic Seated volleyball  12/18/22 Walking around [place], 1 rest break in between  NuStep L5 x34mins  Shoulder assessment - L shoulder flexion 85d - L shoulder abd 60d - L ER 40d  - MMT flexion 2+, abd 2-  5# hip flexion tapping 6" step in RW   Shoulder flexion and abd 1# 2x10   12/16/22 Nustep level 5 x 5 minutes Gait outside total of about 700-800 feet with 2 rests CGA, some min A down curbs, Mod A at times coming up curb Volley ball in sitting Leg curls 25# Lats 25#  12/11/22 Gait from front door around back to door 2 moments of instability and increased left foot drop  noted Nustep L 5 6 min Standing on airex facing mat then lifting hands off for 5 sec CG-Min A 10 x Side stepping at mat including stepping on and over airex 5 x each way- trouble with stepping on shoe Standing at elevated mat 1 hand on mat 1 HHA marching fwd and walking back 8 x STS from chair to elevated mat with UE use min A with cuing to push thru LE 2 sets 5  PATIENT EDUCATION:  Education details: POC Person educated: Patient Education method: Explanation Education comprehension: verbalized understanding  HOME EXERCISE PROGRAM: TBD  ASSESSMENT:  CLINICAL IMPRESSION: Patient arrived late to appt. Does well today with step ups on stairs, but LLE fatigues after 5 reps and she needs a standing rest to finish. Did some standing arm exercises and to do this she needs mod A for balance due to neuropathy, uses her legs against table to stay upright.   OBJECTIVE IMPAIRMENTS: Abnormal gait, cardiopulmonary status limiting activity, decreased activity tolerance, decreased balance, decreased coordination, decreased endurance, decreased mobility, difficulty walking, decreased ROM, decreased strength, impaired flexibility, impaired sensation, and pain.   REHAB POTENTIAL: Good  CLINICAL DECISION MAKING: Stable/uncomplicated  EVALUATION COMPLEXITY: Low   GOALS: Goals reviewed with patient? Yes  SHORT TERM GOALS: Target date: 12/02/22 Independent with initial HEP Goal status: met 12/04/22  LONG TERM GOALS: Target date: 02/17/23  Independent with advanced HEP or gym program Goal status: INITIAL  2.  Decrease TUG time to 24 seconds Goal status:ongoing 12/16/22  3.  Walk 300 feet in 3 minute walk test Goal status: ongoing 12/23/22  4.  Be able to negotiate stairs and curbs with SBA with one at a time and a handrail Goal status: progressing 01/01/23  5.  Report able to warm up a meal without sitting Goal status: 12/11/22 progressing  6. Patient will demonstrate increase in  shoulder ROM to 100d for flexion and abduction  Goal status: 85d flexion, 60d abd  7. Patient will improve L shoulder strength with MMT 4/5   Goal status: 2+ for flexion and 2- for abduction   PLAN:  PT FREQUENCY: 1-2x/week  PT DURATION: 12 weeks  PLANNED INTERVENTIONS: Therapeutic exercises, Therapeutic activity, Neuromuscular re-education, Balance training, Gait training, Patient/Family education, Self Care, Joint mobilization, and Manual therapy  PLAN FOR NEXT SESSION: continue to add exercises for strength function and balance   Cassie Freer, PT 01/06/2023, 5:21 PM [facility] at [facility] [place], [place], [address] Phone: [phone]   Fax:  [phone]

## 2023-01-06 ENCOUNTER — Ambulatory Visit: Payer: Medicare Other | Attending: Family Medicine

## 2023-01-06 DIAGNOSIS — R2681 Unsteadiness on feet: Secondary | ICD-10-CM | POA: Insufficient documentation

## 2023-01-06 DIAGNOSIS — R262 Difficulty in walking, not elsewhere classified: Secondary | ICD-10-CM | POA: Insufficient documentation

## 2023-01-06 DIAGNOSIS — M6281 Muscle weakness (generalized): Secondary | ICD-10-CM | POA: Diagnosis not present

## 2023-01-07 ENCOUNTER — Ambulatory Visit: Payer: Medicare Other

## 2023-01-07 DIAGNOSIS — M6281 Muscle weakness (generalized): Secondary | ICD-10-CM

## 2023-01-07 DIAGNOSIS — R2681 Unsteadiness on feet: Secondary | ICD-10-CM

## 2023-01-07 DIAGNOSIS — R262 Difficulty in walking, not elsewhere classified: Secondary | ICD-10-CM | POA: Diagnosis not present

## 2023-01-07 NOTE — Therapy (Signed)
OUTPATIENT PHYSICAL THERAPY LOWER EXTREMITY TREATMENT    Patient Name: Pamela Snyder MRN: [id number] DOB:[birth year], 77 y.o., female Today's Date: 01/07/2023  END OF SESSION:  PT End of Session - 01/07/23 1659     Visit Number 15    Date for PT Re-Evaluation 02/17/23    Authorization Type Medicare    PT Start Time 1700    PT Stop Time 1748    PT Time Calculation (min) 48 min    Equipment Utilized During Treatment Gait belt    Activity Tolerance Patient tolerated treatment well    Behavior During Therapy WFL for tasks assessed/performed              Past Medical History:  Diagnosis Date   Anxiety    Arthritis    "right foot; spine; hands" (11/23/2014)   Charcot foot due to diabetes mellitus (HCC)    Depression    GERD (gastroesophageal reflux disease)    Gout    Hyperlipemia    Hypertension    Migraine    hx   Neuropathy    Numbness    Thyroid goiter 1986   Type II diabetes mellitus (HCC)    Past Surgical History:  Procedure Laterality Date   ABDOMINAL HYSTERECTOMY  1999   w/BSO   ACHILLES TENDON LENGTHENING Right 11/23/2014   ACHILLES TENDON LENGTHENING Right 11/23/2014   Procedure: RIGHT ACHILLES PERCUTANEOUS TENDON LENGTHENING;  Surgeon: Toni Arthurs, MD;  Location: [facility];  Service: Orthopedics;  Laterality: Right;   APPENDECTOMY  1963   ARTHRODESIS Right 11/23/2014   mid foot   CARDIAC CATHETERIZATION  08/2004   FOOT ARTHRODESIS Right 11/23/2014   Procedure: RIGHT MID FOOT ARTHRODESIS;  Surgeon: Toni Arthurs, MD;  Location: [facility];  Service: Orthopedics;  Laterality: Right;   METATARSAL OSTEOTOMY Right 11/23/2014   Procedure: RIGHT MID FOOT OSTEOTOMY;  Surgeon: Toni Arthurs, MD;  Location: [facility];  Service: Orthopedics;  Laterality: Right;   ORIF ANKLE FRACTURE Left 05/16/2021   Procedure: OPEN REDUCTION INTERNAL FIXATION (ORIF) Left ankle lateral malleolus, possible deltoid ligament repair;  Surgeon: Toni Arthurs, MD;  Location: [facility];  Service: Orthopedics;   Laterality: Left;   OSTEOTOMY Right 11/23/2014   mid foot   PARTIAL THYMECTOMY  1986   ? side   TONSILLECTOMY  1954   Patient Active Problem List   Diagnosis Date Noted   Closed low lateral malleolus fracture 05/16/2021   Gout 05/16/2021   Hyperlipemia 05/16/2021   Hypertension 05/16/2021   Type II diabetes mellitus (HCC) 05/16/2021   Spinal stenosis of lumbar region 03/10/2019   Cervical myelopathy (HCC) 03/10/2019   Peripheral neuropathy 02/02/2019   Gait abnormality 02/02/2019   Urinary urgency 02/02/2019   Chronic bilateral low back pain without sciatica 02/02/2019   DOE (dyspnea on exertion) 10/05/2017   Bruit of right carotid artery 10/05/2017   Coronary artery calcification seen on CT scan 10/05/2017   Chest pain 10/04/2017   Aortic atherosclerosis (HCC) 10/04/2017   Charcot foot due to diabetes mellitus (HCC) 11/23/2014    PCP: Jarrett Soho, PA  REFERRING PROVIDER: Delena Serve, PA  REFERRING DIAG: poor balance, weakness, difficulty walking  THERAPY DIAG:  Muscle weakness (generalized)  Unsteady gait  Rationale for Evaluation and Treatment: Rehabilitation  ONSET DATE: 11/03/22  SUBJECTIVE:    SUBJECTIVE STATEMENT:  doing fine   PERTINENT HISTORY: See above PAIN:  Are you having pain? Yes: NPRS scale: 3/10 Pain location: low back Pain description: tight, stabbing Aggravating factors: being on feet  longer, twisting pain pain up to 6/10 Relieving factors: rest pain will go to 0/10  PRECAUTIONS: Fall  WEIGHT BEARING RESTRICTIONS: No  FALLS:  Has patient fallen in last 6 months? No, just unsteady  LIVING ENVIRONMENT: Lives with: alone Lives in: House/apartment Stairs: Yes: Internal: 12 steps; can reach both Has following equipment at home: Walker - 2 wheeled  OCCUPATION: retired  PLOF: uses walker for household mobility, does her cooking and cleaning using a FWW  PATIENT GOALS: may go to the beach in the fall, attend women's group, feel  stronger, more steady, stay living at home independently  NEXT MD VISIT: none scheduled  OBJECTIVE:   DIAGNOSTIC FINDINGS: none  COGNITION: Overall cognitive status: Within functional limits for tasks assessed     SENSATION: Mid shin down very limited sensation light touch can feel some deeper touch  EDEMA:  Very mild at the ankles  MUSCLE LENGTH: Mild tightness in the HS  POSTURE: rounded shoulders and forward head  PALPATION: Tight and tender in the lumbar paraspinals  LOWER EXTREMITY ROM:  Active ROM Right eval Left eval  Hip flexion 80 80  Hip extension    Hip abduction    Hip adduction    Hip internal rotation    Hip external rotation    Knee flexion    Knee extension 0 0  Ankle dorsiflexion 0 0  Ankle plantarflexion    Ankle inversion    Ankle eversion     (Blank rows = not tested)  LOWER EXTREMITY MMT:  MMT Right eval Left eval  Hip flexion 4 4-  Hip extension 4- 4-  Hip abduction 4 4-  Hip adduction    Hip internal rotation    Hip external rotation    Knee flexion 4 4-  Knee extension 4 3+  Ankle dorsiflexion 2 0  Ankle plantarflexion 1 1  Ankle inversion 1 0  Ankle eversion 0 0   (Blank rows = not tested) FUNCTIONAL TESTS:  TUG: 40 seconds with FWW at evaluation walk test with CGA and FWW = 200 feet at evaluation Unable to stand without holding onto something  GAIT: Distance walked: 200 feet Assistive device utilized: Environmental consultant - 2 wheeled Level of assistance: SBA to start and then needs CGA Comments: foot drop bilaterally, decreased foot control, some hip drop on the left side, when fatigued poor safety with transfers   TODAY'S TREATMENT:                                                                                                                              DATE:  01/07/23 NuStep L5 x35mins  Gait outdoors half the parking [place], sit and rest and then back in, CGA for the curbs Marching on airex  Standing on airex box  taps 6"  STS on airex x10 Side steps on airex holding mat table  Fitter pushes 2 blue bands 2x10   01/06/23 NuStep L5 x65mins  Gait  outdoors half the parking [place], sit and rest and then back in, CGA for the curbs Standing against mat table rows and ext red 2x10 Standing with RW heel taps 6"  Steps 4" OHP red ball Leg ext 20# 2x10  HS curls 25# 2x10  01/01/23 Nustep level 5 x 5 minutes Gait outside with SBA x 250 feet and then 300 feet, CGA/Min A for curbs due to the difficulty she has with placing the walker and then with stepping up the curb 25# triceps with Mod A for balance 5# biceps with Mod A for balance Lats 20# Chest press 10# 2x10 25# leg curls 3x10 15# leg extension 3x10 Seated volleyball  12/30/22 Gait outside half the parking [place], sit and rest and then back in, CGA for the curbs Nustep level 5 x 7 minutes Leg press 40# both legs 2x10, then 20# single legs decreased ROM the left leg is very weak Leg curls 25# 2x10, single legs 15# ont he right and 10# on the left Leg extension 10# 2x10, right leg 10# x10, left leg 5# 2x6 2# hip abduction 2x10 2# marching  12/25/22 NuStep L5 x58mins  Walk around back parking island  Chest press 20# 2x10 Lat pulls downs 25# 2x10 Seated rows with band green 2x10  2# WaTe shoulder flexion 2x10 legs against mat table  STS on airex with RW in front 2x5   12/23/22 Walking around the back parking [place] 1 rest break Side stepping Worked on active DF bilateral ankles 6" toe touches with walker alternating Standing with walker hip abduction and extension STM to the lumbar , thoracic Seated volleyball  12/18/22 Walking around [place], 1 rest break in between  NuStep L5 x6mins  Shoulder assessment - L shoulder flexion 85d - L shoulder abd 60d - L ER 40d  - MMT flexion 2+, abd 2-  5# hip flexion tapping 6" step in RW   Shoulder flexion and abd 1# 2x10   12/16/22 Nustep level 5 x 5 minutes Gait outside total of about 700-800  feet with 2 rests CGA, some min A down curbs, Mod A at times coming up curb Volley ball in sitting Leg curls 25# Lats 25#  12/11/22 Gait from front door around back to door 2 moments of instability and increased left foot drop noted Nustep L 5 6 min Standing on airex facing mat then lifting hands off for 5 sec CG-Min A 10 x Side stepping at mat including stepping on and over airex 5 x each way- trouble with stepping on shoe Standing at elevated mat 1 hand on mat 1 HHA marching fwd and walking back 8 x STS from chair to elevated mat with UE use min A with cuing to push thru LE 2 sets 5  PATIENT EDUCATION:  Education details: POC Person educated: Patient Education method: Explanation Education comprehension: verbalized understanding  HOME EXERCISE PROGRAM: TBD  ASSESSMENT:  CLINICAL IMPRESSION: Does well today with side step up on airex holding on to mat table. Improved speed and lesser break needed with walk today. Able to demonstrate better STS after given verbal cues to put nose over toes and keep her weight forwards. Is able to show good strength through her legs without having to pull up with walker. She has some concerns with her ankle strength and stability. She is unable to actively do much DF so it is hard to strength without active anterior tibialis contraction. Was advised to try small ankle pumps and ABCs as much as she  can do.    OBJECTIVE IMPAIRMENTS: Abnormal gait, cardiopulmonary status limiting activity, decreased activity tolerance, decreased balance, decreased coordination, decreased endurance, decreased mobility, difficulty walking, decreased ROM, decreased strength, impaired flexibility, impaired sensation, and pain.   REHAB POTENTIAL: Good  CLINICAL DECISION MAKING: Stable/uncomplicated  EVALUATION COMPLEXITY: Low   GOALS: Goals reviewed with patient? Yes  SHORT TERM GOALS: Target date: 12/02/22 Independent with initial HEP Goal status: met 12/04/22  LONG  TERM GOALS: Target date: 02/17/23  Independent with advanced HEP or gym program Goal status: INITIAL  2.  Decrease TUG time to 24 seconds Goal status:ongoing 12/16/22  3.  Walk 300 feet in 3 minute walk test Goal status: ongoing 12/23/22  4.  Be able to negotiate stairs and curbs with SBA with one at a time and a handrail Goal status: progressing 01/01/23  5.  Report able to warm up a meal without sitting Goal status: 12/11/22 progressing  6. Patient will demonstrate increase in shoulder ROM to 100d for flexion and abduction  Goal status: 85d flexion, 60d abd  7. Patient will improve L shoulder strength with MMT 4/5   Goal status: 2+ for flexion and 2- for abduction   PLAN:  PT FREQUENCY: 1-2x/week  PT DURATION: 12 weeks  PLANNED INTERVENTIONS: Therapeutic exercises, Therapeutic activity, Neuromuscular re-education, Balance training, Gait training, Patient/Family education, Self Care, Joint mobilization, and Manual therapy  PLAN FOR NEXT SESSION: continue to add exercises for strength function and balance   Cassie Freer, PT 01/07/2023, 5:53 PM [place] [facility] at [facility]. [place], [place], [address] Phone: [phone]   Fax:  [phone]

## 2023-01-12 NOTE — Therapy (Signed)
OUTPATIENT PHYSICAL THERAPY LOWER EXTREMITY TREATMENT    Patient Name: Pamela Snyder MRN: [id number] DOB:[birth year], 77 y.o., female Today's Date: 01/13/2023  END OF SESSION:  PT End of Session - 01/13/23 1627     Visit Number 16    Date for PT Re-Evaluation 02/17/23    Authorization Type Medicare    PT Start Time 1627    PT Stop Time 1715    PT Time Calculation (min) 48 min    Equipment Utilized During Treatment Gait belt    Activity Tolerance Patient tolerated treatment well    Behavior During Therapy WFL for tasks assessed/performed               Past Medical History:  Diagnosis Date   Anxiety    Arthritis    "right foot; spine; hands" (11/23/2014)   Charcot foot due to diabetes mellitus (HCC)    Depression    GERD (gastroesophageal reflux disease)    Gout    Hyperlipemia    Hypertension    Migraine    hx   Neuropathy    Numbness    Thyroid goiter 1986   Type II diabetes mellitus (HCC)    Past Surgical History:  Procedure Laterality Date   ABDOMINAL HYSTERECTOMY  1999   w/BSO   ACHILLES TENDON LENGTHENING Right 11/23/2014   ACHILLES TENDON LENGTHENING Right 11/23/2014   Procedure: RIGHT ACHILLES PERCUTANEOUS TENDON LENGTHENING;  Surgeon: Toni Arthurs, MD;  Location: [facility];  Service: Orthopedics;  Laterality: Right;   APPENDECTOMY  1963   ARTHRODESIS Right 11/23/2014   mid foot   CARDIAC CATHETERIZATION  08/2004   FOOT ARTHRODESIS Right 11/23/2014   Procedure: RIGHT MID FOOT ARTHRODESIS;  Surgeon: Toni Arthurs, MD;  Location: [facility];  Service: Orthopedics;  Laterality: Right;   METATARSAL OSTEOTOMY Right 11/23/2014   Procedure: RIGHT MID FOOT OSTEOTOMY;  Surgeon: Toni Arthurs, MD;  Location: [facility];  Service: Orthopedics;  Laterality: Right;   ORIF ANKLE FRACTURE Left 05/16/2021   Procedure: OPEN REDUCTION INTERNAL FIXATION (ORIF) Left ankle lateral malleolus, possible deltoid ligament repair;  Surgeon: Toni Arthurs, MD;  Location: [facility];  Service:  Orthopedics;  Laterality: Left;   OSTEOTOMY Right 11/23/2014   mid foot   PARTIAL THYMECTOMY  1986   ? side   TONSILLECTOMY  1954   Patient Active Problem List   Diagnosis Date Noted   Closed low lateral malleolus fracture 05/16/2021   Gout 05/16/2021   Hyperlipemia 05/16/2021   Hypertension 05/16/2021   Type II diabetes mellitus (HCC) 05/16/2021   Spinal stenosis of lumbar region 03/10/2019   Cervical myelopathy (HCC) 03/10/2019   Peripheral neuropathy 02/02/2019   Gait abnormality 02/02/2019   Urinary urgency 02/02/2019   Chronic bilateral low back pain without sciatica 02/02/2019   DOE (dyspnea on exertion) 10/05/2017   Bruit of right carotid artery 10/05/2017   Coronary artery calcification seen on CT scan 10/05/2017   Chest pain 10/04/2017   Aortic atherosclerosis (HCC) 10/04/2017   Charcot foot due to diabetes mellitus (HCC) 11/23/2014    PCP: Jarrett Soho, PA  REFERRING PROVIDER: Delena Serve, PA  REFERRING DIAG: poor balance, weakness, difficulty walking  THERAPY DIAG:  Muscle weakness (generalized)  Unsteady gait  Difficulty in walking, not elsewhere classified  Rationale for Evaluation and Treatment: Rehabilitation  ONSET DATE: 11/03/22  SUBJECTIVE:    SUBJECTIVE STATEMENT:  I am good, just sleepy    PERTINENT HISTORY: See above PAIN:  Are you having pain? Yes: NPRS scale: 0/10 Pain  location: low back Pain description: tight, stabbing Aggravating factors: being on feet longer, twisting pain pain up to 6/10 Relieving factors: rest pain will go to 0/10  PRECAUTIONS: Fall  WEIGHT BEARING RESTRICTIONS: No  FALLS:  Has patient fallen in last 6 months? No, just unsteady  LIVING ENVIRONMENT: Lives with: alone Lives in: House/apartment Stairs: Yes: Internal: 12 steps; can reach both Has following equipment at home: Walker - 2 wheeled  OCCUPATION: retired  PLOF: uses walker for household mobility, does her cooking and cleaning using a  FWW  PATIENT GOALS: may go to the beach in the fall, attend women's group, feel stronger, more steady, stay living at home independently  NEXT MD VISIT: none scheduled  OBJECTIVE:   DIAGNOSTIC FINDINGS: none  COGNITION: Overall cognitive status: Within functional limits for tasks assessed     SENSATION: Mid shin down very limited sensation light touch can feel some deeper touch  EDEMA:  Very mild at the ankles  MUSCLE LENGTH: Mild tightness in the HS  POSTURE: rounded shoulders and forward head  PALPATION: Tight and tender in the lumbar paraspinals  LOWER EXTREMITY ROM:  Active ROM Right eval Left eval  Hip flexion 80 80  Hip extension    Hip abduction    Hip adduction    Hip internal rotation    Hip external rotation    Knee flexion    Knee extension 0 0  Ankle dorsiflexion 0 0  Ankle plantarflexion    Ankle inversion    Ankle eversion     (Blank rows = not tested)  LOWER EXTREMITY MMT:  MMT Right eval Left eval  Hip flexion 4 4-  Hip extension 4- 4-  Hip abduction 4 4-  Hip adduction    Hip internal rotation    Hip external rotation    Knee flexion 4 4-  Knee extension 4 3+  Ankle dorsiflexion 2 0  Ankle plantarflexion 1 1  Ankle inversion 1 0  Ankle eversion 0 0   (Blank rows = not tested) FUNCTIONAL TESTS:  TUG: 40 seconds with FWW at evaluation walk test with CGA and FWW = 200 feet at evaluation Unable to stand without holding onto something  GAIT: Distance walked: 200 feet Assistive device utilized: Walker - 2 wheeled Level of assistance: SBA to start and then needs CGA Comments: foot drop bilaterally, decreased foot control, some hip drop on the left side, when fatigued poor safety with transfers   TODAY'S TREATMENT:                                                                                                                              DATE:  01/13/23 NuStep L5 x53mins  UBE L3 x63mins each way  Calf stretch 30s  x2 Standing marches 3# ankle weights  Hip abd 3# 2x10  Leg ext 20# 3x10  HS curls 25# 3x10 Volleyball hit seated   01/07/23 NuStep L5 x66mins  Gait outdoors  half the parking [place], sit and rest and then back in, CGA for the curbs Marching on airex  Standing on airex box taps 6"  STS on airex x10 Side steps on airex holding mat table  Fitter pushes 2 blue bands 2x10   01/06/23 NuStep L5 x73mins  Gait outdoors half the parking [place], sit and rest and then back in, CGA for the curbs Standing against mat table rows and ext red 2x10 Standing with RW heel taps 6"  Steps 4" OHP red ball Leg ext 20# 2x10  HS curls 25# 2x10  01/01/23 Nustep level 5 x 5 minutes Gait outside with SBA x 250 feet and then 300 feet, CGA/Min A for curbs due to the difficulty she has with placing the walker and then with stepping up the curb 25# triceps with Mod A for balance 5# biceps with Mod A for balance Lats 20# Chest press 10# 2x10 25# leg curls 3x10 15# leg extension 3x10 Seated volleyball  12/30/22 Gait outside half the parking [place], sit and rest and then back in, CGA for the curbs Nustep level 5 x 7 minutes Leg press 40# both legs 2x10, then 20# single legs decreased ROM the left leg is very weak Leg curls 25# 2x10, single legs 15# ont he right and 10# on the left Leg extension 10# 2x10, right leg 10# x10, left leg 5# 2x6 2# hip abduction 2x10 2# marching  12/25/22 NuStep L5 x73mins  Walk around back parking island  Chest press 20# 2x10 Lat pulls downs 25# 2x10 Seated rows with band green 2x10  2# WaTe shoulder flexion 2x10 legs against mat table  STS on airex with RW in front 2x5   12/23/22 Walking around the back parking [place] 1 rest break Side stepping Worked on active DF bilateral ankles 6" toe touches with walker alternating Standing with walker hip abduction and extension STM to the lumbar , thoracic Seated volleyball  12/18/22 Walking around [place], 1 rest break in between   NuStep L5 x37mins  Shoulder assessment - L shoulder flexion 85d - L shoulder abd 60d - L ER 40d  - MMT flexion 2+, abd 2-  5# hip flexion tapping 6" step in RW   Shoulder flexion and abd 1# 2x10   12/16/22 Nustep level 5 x 5 minutes Gait outside total of about 700-800 feet with 2 rests CGA, some min A down curbs, Mod A at times coming up curb Volley ball in sitting Leg curls 25# Lats 25#  12/11/22 Gait from front door around back to door 2 moments of instability and increased left foot drop noted Nustep L 5 6 min Standing on airex facing mat then lifting hands off for 5 sec CG-Min A 10 x Side stepping at mat including stepping on and over airex 5 x each way- trouble with stepping on shoe Standing at elevated mat 1 hand on mat 1 HHA marching fwd and walking back 8 x STS from chair to elevated mat with UE use min A with cuing to push thru LE 2 sets 5  PATIENT EDUCATION:  Education details: POC Person educated: Patient Education method: Explanation Education comprehension: verbalized understanding  HOME EXERCISE PROGRAM: TBD  ASSESSMENT:  CLINICAL IMPRESSION: Patient is doing well. Continued to work on functional movements and LE strengthening. Unable to do calf raises, little to no movement although she felt like her heels were off the ground.    OBJECTIVE IMPAIRMENTS: Abnormal gait, cardiopulmonary status limiting activity, decreased activity tolerance, decreased balance,  decreased coordination, decreased endurance, decreased mobility, difficulty walking, decreased ROM, decreased strength, impaired flexibility, impaired sensation, and pain.   REHAB POTENTIAL: Good  CLINICAL DECISION MAKING: Stable/uncomplicated  EVALUATION COMPLEXITY: Low   GOALS: Goals reviewed with patient? Yes  SHORT TERM GOALS: Target date: 12/02/22 Independent with initial HEP Goal status: met 12/04/22  LONG TERM GOALS: Target date: 02/17/23  Independent with advanced HEP or gym program Goal  status: INITIAL  2.  Decrease TUG time to 24 seconds Goal status:ongoing 12/16/22  3.  Walk 300 feet in 3 minute walk test Goal status: ongoing 12/23/22  4.  Be able to negotiate stairs and curbs with SBA with one at a time and a handrail Goal status: progressing 01/01/23  5.  Report able to warm up a meal without sitting Goal status: 12/11/22 progressing  6. Patient will demonstrate increase in shoulder ROM to 100d for flexion and abduction  Goal status: 85d flexion, 60d abd  7. Patient will improve L shoulder strength with MMT 4/5   Goal status: 2+ for flexion and 2- for abduction   PLAN:  PT FREQUENCY: 1-2x/week  PT DURATION: 12 weeks  PLANNED INTERVENTIONS: Therapeutic exercises, Therapeutic activity, Neuromuscular re-education, Balance training, Gait training, Patient/Family education, Self Care, Joint mobilization, and Manual therapy  PLAN FOR NEXT SESSION: continue to add exercises for strength function and balance   Cassie Freer, PT 01/13/2023, 5:14 PM [place] [facility] at [facility]. [place], [place], [address] Phone: [phone]   Fax:  [phone]

## 2023-01-13 ENCOUNTER — Ambulatory Visit: Payer: Medicare Other

## 2023-01-13 DIAGNOSIS — M6281 Muscle weakness (generalized): Secondary | ICD-10-CM

## 2023-01-13 DIAGNOSIS — R262 Difficulty in walking, not elsewhere classified: Secondary | ICD-10-CM

## 2023-01-13 DIAGNOSIS — R2681 Unsteadiness on feet: Secondary | ICD-10-CM | POA: Diagnosis not present

## 2023-01-15 ENCOUNTER — Ambulatory Visit: Payer: Medicare Other | Admitting: Physical Therapy

## 2023-01-15 ENCOUNTER — Encounter: Payer: Self-pay | Admitting: Physical Therapy

## 2023-01-15 DIAGNOSIS — R262 Difficulty in walking, not elsewhere classified: Secondary | ICD-10-CM | POA: Diagnosis not present

## 2023-01-15 DIAGNOSIS — R2681 Unsteadiness on feet: Secondary | ICD-10-CM | POA: Diagnosis not present

## 2023-01-15 DIAGNOSIS — M6281 Muscle weakness (generalized): Secondary | ICD-10-CM

## 2023-01-15 NOTE — Therapy (Signed)
OUTPATIENT PHYSICAL THERAPY LOWER EXTREMITY TREATMENT    Patient Name: Pamela Snyder MRN: [id number] DOB:1945-11-24, 77 y.o., female Today's Date: 01/15/2023  END OF SESSION:  PT End of Session - 01/15/23 1746     Visit Number 17    Date for PT Re-Evaluation 02/17/23    Authorization Type Medicare    PT Start Time 1739    PT Stop Time 1830    PT Time Calculation (min) 51 min    Equipment Utilized During Treatment Gait belt    Activity Tolerance Patient tolerated treatment well    Behavior During Therapy WFL for tasks assessed/performed               Past Medical History:  Diagnosis Date   Anxiety    Arthritis    "right foot; spine; hands" (11/23/2014)   Charcot foot due to diabetes mellitus (HCC)    Depression    GERD (gastroesophageal reflux disease)    Gout    Hyperlipemia    Hypertension    Migraine    hx   Neuropathy    Numbness    Thyroid goiter 1986   Type II diabetes mellitus (HCC)    Past Surgical History:  Procedure Laterality Date   ABDOMINAL HYSTERECTOMY  1999   w/BSO   ACHILLES TENDON LENGTHENING Right 11/23/2014   ACHILLES TENDON LENGTHENING Right 11/23/2014   Procedure: RIGHT ACHILLES PERCUTANEOUS TENDON LENGTHENING;  Surgeon: Toni Arthurs, MD;  Location: [facility];  Service: Orthopedics;  Laterality: Right;   APPENDECTOMY  1963   ARTHRODESIS Right 11/23/2014   mid foot   CARDIAC CATHETERIZATION  08/2004   FOOT ARTHRODESIS Right 11/23/2014   Procedure: RIGHT MID FOOT ARTHRODESIS;  Surgeon: Toni Arthurs, MD;  Location: [facility];  Service: Orthopedics;  Laterality: Right;   METATARSAL OSTEOTOMY Right 11/23/2014   Procedure: RIGHT MID FOOT OSTEOTOMY;  Surgeon: Toni Arthurs, MD;  Location: [facility];  Service: Orthopedics;  Laterality: Right;   ORIF ANKLE FRACTURE Left 05/16/2021   Procedure: OPEN REDUCTION INTERNAL FIXATION (ORIF) Left ankle lateral malleolus, possible deltoid ligament repair;  Surgeon: Toni Arthurs, MD;  Location: [facility];  Service:  Orthopedics;  Laterality: Left;   OSTEOTOMY Right 11/23/2014   mid foot   PARTIAL THYMECTOMY  1986   ? side   TONSILLECTOMY  1954   Patient Active Problem List   Diagnosis Date Noted   Closed low lateral malleolus fracture 05/16/2021   Gout 05/16/2021   Hyperlipemia 05/16/2021   Hypertension 05/16/2021   Type II diabetes mellitus (HCC) 05/16/2021   Spinal stenosis of lumbar region 03/10/2019   Cervical myelopathy (HCC) 03/10/2019   Peripheral neuropathy 02/02/2019   Gait abnormality 02/02/2019   Urinary urgency 02/02/2019   Chronic bilateral low back pain without sciatica 02/02/2019   DOE (dyspnea on exertion) 10/05/2017   Bruit of right carotid artery 10/05/2017   Coronary artery calcification seen on CT scan 10/05/2017   Chest pain 10/04/2017   Aortic atherosclerosis (HCC) 10/04/2017   Charcot foot due to diabetes mellitus (HCC) 11/23/2014    PCP: Jarrett Soho, PA  REFERRING PROVIDER: Delena Serve, PA  REFERRING DIAG: poor balance, weakness, difficulty walking  THERAPY DIAG:  Muscle weakness (generalized)  Unsteady gait  Difficulty in walking, not elsewhere classified  Rationale for Evaluation and Treatment: Rehabilitation  ONSET DATE: 11/03/22  SUBJECTIVE:    SUBJECTIVE STATEMENT:  reports not doing well, did not sleep well and reports that she has had multiple things she has had to do today  PERTINENT HISTORY: See above PAIN:  Are you having pain? Yes: NPRS scale: 0/10 Pain location: low back Pain description: tight, stabbing Aggravating factors: being on feet longer, twisting pain pain up to 6/10 Relieving factors: rest pain will go to 0/10  PRECAUTIONS: Fall  WEIGHT BEARING RESTRICTIONS: No  FALLS:  Has patient fallen in last 6 months? No, just unsteady  LIVING ENVIRONMENT: Lives with: alone Lives in: House/apartment Stairs: Yes: Internal: 12 steps; can reach both Has following equipment at home: Walker - 2 wheeled  OCCUPATION:  retired  PLOF: uses walker for household mobility, does her cooking and cleaning using a FWW  PATIENT GOALS: may go to the beach in the fall, attend women's group, feel stronger, more steady, stay living at home independently  NEXT MD VISIT: none scheduled  OBJECTIVE:   DIAGNOSTIC FINDINGS: none  COGNITION: Overall cognitive status: Within functional limits for tasks assessed     SENSATION: Mid shin down very limited sensation light touch can feel some deeper touch  EDEMA:  Very mild at the ankles  MUSCLE LENGTH: Mild tightness in the HS  POSTURE: rounded shoulders and forward head  PALPATION: Tight and tender in the lumbar paraspinals  LOWER EXTREMITY ROM:  Active ROM Right eval Left eval  Hip flexion 80 80  Hip extension    Hip abduction    Hip adduction    Hip internal rotation    Hip external rotation    Knee flexion    Knee extension 0 0  Ankle dorsiflexion 0 0  Ankle plantarflexion    Ankle inversion    Ankle eversion     (Blank rows = not tested)  LOWER EXTREMITY MMT:  MMT Right eval Left eval  Hip flexion 4 4-  Hip extension 4- 4-  Hip abduction 4 4-  Hip adduction    Hip internal rotation    Hip external rotation    Knee flexion 4 4-  Knee extension 4 3+  Ankle dorsiflexion 2 0  Ankle plantarflexion 1 1  Ankle inversion 1 0  Ankle eversion 0 0   (Blank rows = not tested) FUNCTIONAL TESTS:  TUG: 40 seconds with FWW at evaluation walk test with CGA and FWW = 200 feet at evaluation Unable to stand without holding onto something  GAIT: Distance walked: 200 feet Assistive device utilized: Walker - 2 wheeled Level of assistance: SBA to start and then needs CGA Comments: foot drop bilaterally, decreased foot control, some hip drop on the left side, when fatigued poor safety with transfers   TODAY'S TREATMENT:                                                                                                                               DATE:  01/15/23 Nustep level 5 x 7 minutes Gait outside 1/2 way around the parking [place] in the back, sit and rest and then around to the front of the building, 3 curbs,  all with CGA except one time going down needed min A due to LOB backward 2 # hip extension and abduction Ball in lap isometric abs, cues needed to go slow and get good contraction Side steppping Seated volley ball Standing trying to find her balance very difficult for her due to neuropathy Sit to stand trying to have her not use the back of her legs with PT in front and her hands on my shoulders x 10  01/13/23 NuStep L5 x48mins  UBE L3 x73mins each way  Calf stretch 30s x2 Standing marches 3# ankle weights  Hip abd 3# 2x10  Leg ext 20# 3x10  HS curls 25# 3x10 Volleyball hit seated   01/07/23 NuStep L5 x63mins  Gait outdoors half the parking [place], sit and rest and then back in, CGA for the curbs Marching on airex  Standing on airex box taps 6"  STS on airex x10 Side steps on airex holding mat table  Fitter pushes 2 blue bands 2x10   01/06/23 NuStep L5 x70mins  Gait outdoors half the parking [place], sit and rest and then back in, CGA for the curbs Standing against mat table rows and ext red 2x10 Standing with RW heel taps 6"  Steps 4" OHP red ball Leg ext 20# 2x10  HS curls 25# 2x10  01/01/23 Nustep level 5 x 5 minutes Gait outside with SBA x 250 feet and then 300 feet, CGA/Min A for curbs due to the difficulty she has with placing the walker and then with stepping up the curb 25# triceps with Mod A for balance 5# biceps with Mod A for balance Lats 20# Chest press 10# 2x10 25# leg curls 3x10 15# leg extension 3x10 Seated volleyball  12/30/22 Gait outside half the parking [place], sit and rest and then back in, CGA for the curbs Nustep level 5 x 7 minutes Leg press 40# both legs 2x10, then 20# single legs decreased ROM the left leg is very weak Leg curls 25# 2x10, single legs 15# ont he right and 10# on  the left Leg extension 10# 2x10, right leg 10# x10, left leg 5# 2x6 2# hip abduction 2x10 2# marching  12/25/22 NuStep L5 x60mins  Walk around back parking island  Chest press 20# 2x10 Lat pulls downs 25# 2x10 Seated rows with band green 2x10  2# WaTe shoulder flexion 2x10 legs against mat table  STS on airex with RW in front 2x5   12/23/22 Walking around the back parking [place] 1 rest break Side stepping Worked on active DF bilateral ankles 6" toe touches with walker alternating Standing with walker hip abduction and extension STM to the lumbar , thoracic Seated volleyball  12/18/22 Walking around [place], 1 rest break in between  NuStep L5 x35mins  Shoulder assessment - L shoulder flexion 85d - L shoulder abd 60d - L ER 40d  - MMT flexion 2+, abd 2-  5# hip flexion tapping 6" step in RW   Shoulder flexion and abd 1# 2x10   12/16/22 Nustep level 5 x 5 minutes Gait outside total of about 700-800 feet with 2 rests CGA, some min A down curbs, Mod A at times coming up curb Volley ball in sitting Leg curls 25# Lats 25#  12/11/22 Gait from front door around back to door 2 moments of instability and increased left foot drop noted Nustep L 5 6 min Standing on airex facing mat then lifting hands off for 5 sec CG-Min A 10 x Side stepping at mat  including stepping on and over airex 5 x each way- trouble with stepping on shoe Standing at elevated mat 1 hand on mat 1 HHA marching fwd and walking back 8 x STS from chair to elevated mat with UE use min A with cuing to push thru LE 2 sets 5  PATIENT EDUCATION:  Education details: POC Person educated: Patient Education method: Explanation Education comprehension: verbalized understanding  HOME EXERCISE PROGRAM: TBD  ASSESSMENT:  CLINICAL IMPRESSION: Patient not feeling well today but was able to tolerate al activities, she did have a few more times that she needed min A due to LOB, as noted above the neuropathy caused issues with  balance on her own and with sit to stand activities  OBJECTIVE IMPAIRMENTS: Abnormal gait, cardiopulmonary status limiting activity, decreased activity tolerance, decreased balance, decreased coordination, decreased endurance, decreased mobility, difficulty walking, decreased ROM, decreased strength, impaired flexibility, impaired sensation, and pain.   REHAB POTENTIAL: Good  CLINICAL DECISION MAKING: Stable/uncomplicated  EVALUATION COMPLEXITY: Low   GOALS: Goals reviewed with patient? Yes  SHORT TERM GOALS: Target date: 12/02/22 Independent with initial HEP Goal status: met 12/04/22  LONG TERM GOALS: Target date: 02/17/23  Independent with advanced HEP or gym program Goal status ongoing 01/15/23  2.  Decrease TUG time to 24 seconds Goal status:ongoing 12/16/22  3.  Walk 300 feet in 3 minute walk test Goal status: ongoing 12/23/22  4.  Be able to negotiate stairs and curbs with SBA with one at a time and a handrail Goal status: progressing 01/01/23  5.  Report able to warm up a meal without sitting Goal status: 12/11/22 progressing  6. Patient will demonstrate increase in shoulder ROM to 100d for flexion and abduction  Goal status: 85d flexion, 60d abd  7. Patient will improve L shoulder strength with MMT 4/5   Goal status: 2+ for flexion and 2- for abduction   PLAN:  PT FREQUENCY: 1-2x/week  PT DURATION: 12 weeks  PLANNED INTERVENTIONS: Therapeutic exercises, Therapeutic activity, Neuromuscular re-education, Balance training, Gait training, Patient/Family education, Self Care, Joint mobilization, and Manual therapy  PLAN FOR NEXT SESSION: continue to add exercises for strength function and balance   Jearld Lesch, PT 01/15/2023, 5:46 PM [facility] at [facility] [address] [facility] [place], [place], [address] Phone: [phone]   Fax:  [phone]

## 2023-01-19 NOTE — Therapy (Signed)
OUTPATIENT PHYSICAL THERAPY LOWER EXTREMITY TREATMENT    Patient Name: Pamela Snyder MRN: [id number] DOB:1946-05-08, 77 y.o., female Today's Date: 01/20/2023  END OF SESSION:  PT End of Session - 01/20/23 1545     Visit Number 18    Date for PT Re-Evaluation 02/17/23    Authorization Type Medicare    PT Start Time 1545    PT Stop Time 1630    PT Time Calculation (min) 45 min    Equipment Utilized During Treatment Gait belt    Activity Tolerance Patient tolerated treatment well    Behavior During Therapy WFL for tasks assessed/performed                Past Medical History:  Diagnosis Date   Anxiety    Arthritis    "right foot; spine; hands" (11/23/2014)   Charcot foot due to diabetes mellitus (HCC)    Depression    GERD (gastroesophageal reflux disease)    Gout    Hyperlipemia    Hypertension    Migraine    hx   Neuropathy    Numbness    Thyroid goiter 1986   Type II diabetes mellitus (HCC)    Past Surgical History:  Procedure Laterality Date   ABDOMINAL HYSTERECTOMY  1999   w/BSO   ACHILLES TENDON LENGTHENING Right 11/23/2014   ACHILLES TENDON LENGTHENING Right 11/23/2014   Procedure: RIGHT ACHILLES PERCUTANEOUS TENDON LENGTHENING;  Surgeon: Toni Arthurs, MD;  Location: [facility];  Service: Orthopedics;  Laterality: Right;   APPENDECTOMY  1963   ARTHRODESIS Right 11/23/2014   mid foot   CARDIAC CATHETERIZATION  08/2004   FOOT ARTHRODESIS Right 11/23/2014   Procedure: RIGHT MID FOOT ARTHRODESIS;  Surgeon: Toni Arthurs, MD;  Location: [facility];  Service: Orthopedics;  Laterality: Right;   METATARSAL OSTEOTOMY Right 11/23/2014   Procedure: RIGHT MID FOOT OSTEOTOMY;  Surgeon: Toni Arthurs, MD;  Location: [facility];  Service: Orthopedics;  Laterality: Right;   ORIF ANKLE FRACTURE Left 05/16/2021   Procedure: OPEN REDUCTION INTERNAL FIXATION (ORIF) Left ankle lateral malleolus, possible deltoid ligament repair;  Surgeon: Toni Arthurs, MD;  Location: [facility];  Service:  Orthopedics;  Laterality: Left;   OSTEOTOMY Right 11/23/2014   mid foot   PARTIAL THYMECTOMY  1986   ? side   TONSILLECTOMY  1954   Patient Active Problem List   Diagnosis Date Noted   Closed low lateral malleolus fracture 05/16/2021   Gout 05/16/2021   Hyperlipemia 05/16/2021   Hypertension 05/16/2021   Type II diabetes mellitus (HCC) 05/16/2021   Spinal stenosis of lumbar region 03/10/2019   Cervical myelopathy (HCC) 03/10/2019   Peripheral neuropathy 02/02/2019   Gait abnormality 02/02/2019   Urinary urgency 02/02/2019   Chronic bilateral low back pain without sciatica 02/02/2019   DOE (dyspnea on exertion) 10/05/2017   Bruit of right carotid artery 10/05/2017   Coronary artery calcification seen on CT scan 10/05/2017   Chest pain 10/04/2017   Aortic atherosclerosis (HCC) 10/04/2017   Charcot foot due to diabetes mellitus (HCC) 11/23/2014    PCP: Jarrett Soho, PA  REFERRING PROVIDER: Delena Serve, PA  REFERRING DIAG: poor balance, weakness, difficulty walking  THERAPY DIAG:  Muscle weakness (generalized)  Unsteady gait  Difficulty in walking, not elsewhere classified  Rationale for Evaluation and Treatment: Rehabilitation  ONSET DATE: 11/03/22  SUBJECTIVE:    SUBJECTIVE STATEMENT:  I am tired   PERTINENT HISTORY: See above PAIN:  Are you having pain? Yes: NPRS scale: 0/10 Pain location: low  back Pain description: tight, stabbing Aggravating factors: being on feet longer, twisting pain pain up to 6/10 Relieving factors: rest pain will go to 0/10  PRECAUTIONS: Fall  WEIGHT BEARING RESTRICTIONS: No  FALLS:  Has patient fallen in last 6 months? No, just unsteady  LIVING ENVIRONMENT: Lives with: alone Lives in: House/apartment Stairs: Yes: Internal: 12 steps; can reach both Has following equipment at home: Walker - 2 wheeled  OCCUPATION: retired  PLOF: uses walker for household mobility, does her cooking and cleaning using a FWW  PATIENT GOALS:  may go to the beach in the fall, attend women's group, feel stronger, more steady, stay living at home independently  NEXT MD VISIT: none scheduled  OBJECTIVE:   DIAGNOSTIC FINDINGS: none  COGNITION: Overall cognitive status: Within functional limits for tasks assessed     SENSATION: Mid shin down very limited sensation light touch can feel some deeper touch  EDEMA:  Very mild at the ankles  MUSCLE LENGTH: Mild tightness in the HS  POSTURE: rounded shoulders and forward head  PALPATION: Tight and tender in the lumbar paraspinals  LOWER EXTREMITY ROM:  Active ROM Right eval Left eval  Hip flexion 80 80  Hip extension    Hip abduction    Hip adduction    Hip internal rotation    Hip external rotation    Knee flexion    Knee extension 0 0  Ankle dorsiflexion 0 0  Ankle plantarflexion    Ankle inversion    Ankle eversion     (Blank rows = not tested)  LOWER EXTREMITY MMT:  MMT Right eval Left eval  Hip flexion 4 4-  Hip extension 4- 4-  Hip abduction 4 4-  Hip adduction    Hip internal rotation    Hip external rotation    Knee flexion 4 4-  Knee extension 4 3+  Ankle dorsiflexion 2 0  Ankle plantarflexion 1 1  Ankle inversion 1 0  Ankle eversion 0 0   (Blank rows = not tested) FUNCTIONAL TESTS:  TUG: 40 seconds with FWW at evaluation walk test with CGA and FWW = 200 feet at evaluation Unable to stand without holding onto something  GAIT: Distance walked: 200 feet Assistive device utilized: Walker - 2 wheeled Level of assistance: SBA to start and then needs CGA Comments: foot drop bilaterally, decreased foot control, some hip drop on the left side, when fatigued poor safety with transfers   TODAY'S TREATMENT:                                                                                                                              DATE:  01/20/23 NuStep L5 x54mins  Standing in RW no UE use, 8s, 9s, 6s  - Then on airex 3s x5  Supine  bridges x10, unable to do another set due to pain in low back  SLR 2x10  Feet on ball rotations and knees to chest x10  Double knees to chest 30s Single knees to chest 30s  Pball flexion roll outs x10 Step ups 6" on stairs x3 each side   01/15/23 Nustep level 5 x 7 minutes Gait outside 1/2 way around the parking [place] in the back, sit and rest and then around to the front of the building, 3 curbs, all with CGA except one time going down needed min A due to LOB backward 2 # hip extension and abduction Ball in lap isometric abs, cues needed to go slow and get good contraction Side steppping Seated volley ball Standing trying to find her balance very difficult for her due to neuropathy Sit to stand trying to have her not use the back of her legs with PT in front and her hands on my shoulders x 10  01/13/23 NuStep L5 x55mins  UBE L3 x81mins each way  Calf stretch 30s x2 Standing marches 3# ankle weights  Hip abd 3# 2x10  Leg ext 20# 3x10  HS curls 25# 3x10 Volleyball hit seated   01/07/23 NuStep L5 x6mins  Gait outdoors half the parking [place], sit and rest and then back in, CGA for the curbs Marching on airex  Standing on airex box taps 6"  STS on airex x10 Side steps on airex holding mat table  Fitter pushes 2 blue bands 2x10   01/06/23 NuStep L5 x63mins  Gait outdoors half the parking [place], sit and rest and then back in, CGA for the curbs Standing against mat table rows and ext red 2x10 Standing with RW heel taps 6"  Steps 4" OHP red ball Leg ext 20# 2x10  HS curls 25# 2x10  01/01/23 Nustep level 5 x 5 minutes Gait outside with SBA x 250 feet and then 300 feet, CGA/Min A for curbs due to the difficulty she has with placing the walker and then with stepping up the curb 25# triceps with Mod A for balance 5# biceps with Mod A for balance Lats 20# Chest press 10# 2x10 25# leg curls 3x10 15# leg extension 3x10 Seated volleyball  12/30/22 Gait outside half the parking  [place], sit and rest and then back in, CGA for the curbs Nustep level 5 x 7 minutes Leg press 40# both legs 2x10, then 20# single legs decreased ROM the left leg is very weak Leg curls 25# 2x10, single legs 15# ont he right and 10# on the left Leg extension 10# 2x10, right leg 10# x10, left leg 5# 2x6 2# hip abduction 2x10 2# marching  12/25/22 NuStep L5 x37mins  Walk around back parking island  Chest press 20# 2x10 Lat pulls downs 25# 2x10 Seated rows with band green 2x10  2# WaTe shoulder flexion 2x10 legs against mat table  STS on airex with RW in front 2x5   12/23/22 Walking around the back parking [place] 1 rest break Side stepping Worked on active DF bilateral ankles 6" toe touches with walker alternating Standing with walker hip abduction and extension STM to the lumbar , thoracic Seated volleyball  12/18/22 Walking around [place], 1 rest break in between  NuStep L5 x33mins  Shoulder assessment - L shoulder flexion 85d - L shoulder abd 60d - L ER 40d  - MMT flexion 2+, abd 2-  5# hip flexion tapping 6" step in RW   Shoulder flexion and abd 1# 2x10   12/16/22 Nustep level 5 x 5 minutes Gait outside total of about 700-800 feet with 2 rests CGA, some min A down curbs, Mod A at  times coming up curb Volley ball in sitting Leg curls 25# Lats 25#  12/11/22 Gait from front door around back to door 2 moments of instability and increased left foot drop noted Nustep L 5 6 min Standing on airex facing mat then lifting hands off for 5 sec CG-Min A 10 x Side stepping at mat including stepping on and over airex 5 x each way- trouble with stepping on shoe Standing at elevated mat 1 hand on mat 1 HHA marching fwd and walking back 8 x STS from chair to elevated mat with UE use min A with cuing to push thru LE 2 sets 5  PATIENT EDUCATION:  Education details: POC Person educated: Patient Education method: Explanation Education comprehension: verbalized understanding  HOME EXERCISE  PROGRAM: TBD  ASSESSMENT:  CLINICAL IMPRESSION: Patient is tired today after an outing with her friends. We worked on some standing balance today without support. She is hesitant but able to do small reps of a few seconds on firm and foam surfaces. She has come c/o back pain today so we tried some supine exercises today. Difficulty with step ups on 6" stairs, heavy UE use.   OBJECTIVE IMPAIRMENTS: Abnormal gait, cardiopulmonary status limiting activity, decreased activity tolerance, decreased balance, decreased coordination, decreased endurance, decreased mobility, difficulty walking, decreased ROM, decreased strength, impaired flexibility, impaired sensation, and pain.   REHAB POTENTIAL: Good  CLINICAL DECISION MAKING: Stable/uncomplicated  EVALUATION COMPLEXITY: Low   GOALS: Goals reviewed with patient? Yes  SHORT TERM GOALS: Target date: 12/02/22 Independent with initial HEP Goal status: met 12/04/22  LONG TERM GOALS: Target date: 02/17/23  Independent with advanced HEP or gym program Goal status ongoing 01/15/23  2.  Decrease TUG time to 24 seconds Goal status:ongoing 12/16/22  3.  Walk 300 feet in 3 minute walk test Goal status: ongoing 12/23/22  4.  Be able to negotiate stairs and curbs with SBA with one at a time and a handrail Goal status: progressing 01/01/23  5.  Report able to warm up a meal without sitting Goal status: 12/11/22 progressing  6. Patient will demonstrate increase in shoulder ROM to 100d for flexion and abduction  Goal status: 85d flexion, 60d abd  7. Patient will improve L shoulder strength with MMT 4/5   Goal status: 2+ for flexion and 2- for abduction   PLAN:  PT FREQUENCY: 1-2x/week  PT DURATION: 12 weeks  PLANNED INTERVENTIONS: Therapeutic exercises, Therapeutic activity, Neuromuscular re-education, Balance training, Gait training, Patient/Family education, Self Care, Joint mobilization, and Manual therapy  PLAN FOR NEXT SESSION: continue  to add exercises for strength function and balance   Cassie Freer, PT 01/20/2023, 4:27 PM [facility] at [facility] [address] [facility]. [place], [place], [address] Phone: [phone]   Fax:  [phone]

## 2023-01-20 ENCOUNTER — Ambulatory Visit: Payer: Medicare Other

## 2023-01-20 DIAGNOSIS — R2681 Unsteadiness on feet: Secondary | ICD-10-CM

## 2023-01-20 DIAGNOSIS — R262 Difficulty in walking, not elsewhere classified: Secondary | ICD-10-CM

## 2023-01-20 DIAGNOSIS — M6281 Muscle weakness (generalized): Secondary | ICD-10-CM | POA: Diagnosis not present

## 2023-01-21 NOTE — Therapy (Signed)
OUTPATIENT PHYSICAL THERAPY LOWER EXTREMITY TREATMENT    Patient Name: Pamela Snyder MRN: [id number] DOB:Nov 10, 1945, 77 y.o., female Today's Date: 01/22/2023  END OF SESSION:  PT End of Session - 01/22/23 1629     Visit Number 19    Date for PT Re-Evaluation 02/17/23    Authorization Type Medicare    PT Start Time 1630    PT Stop Time 1715    PT Time Calculation (min) 45 min    Equipment Utilized During Treatment Gait belt    Activity Tolerance Patient tolerated treatment well    Behavior During Therapy WFL for tasks assessed/performed                 Past Medical History:  Diagnosis Date   Anxiety    Arthritis    "right foot; spine; hands" (11/23/2014)   Charcot foot due to diabetes mellitus (HCC)    Depression    GERD (gastroesophageal reflux disease)    Gout    Hyperlipemia    Hypertension    Migraine    hx   Neuropathy    Numbness    Thyroid goiter 1986   Type II diabetes mellitus (HCC)    Past Surgical History:  Procedure Laterality Date   ABDOMINAL HYSTERECTOMY  1999   w/BSO   ACHILLES TENDON LENGTHENING Right 11/23/2014   ACHILLES TENDON LENGTHENING Right 11/23/2014   Procedure: RIGHT ACHILLES PERCUTANEOUS TENDON LENGTHENING;  Surgeon: Toni Arthurs, MD;  Location: [facility];  Service: Orthopedics;  Laterality: Right;   APPENDECTOMY  1963   ARTHRODESIS Right 11/23/2014   mid foot   CARDIAC CATHETERIZATION  08/2004   FOOT ARTHRODESIS Right 11/23/2014   Procedure: RIGHT MID FOOT ARTHRODESIS;  Surgeon: Toni Arthurs, MD;  Location: [facility];  Service: Orthopedics;  Laterality: Right;   METATARSAL OSTEOTOMY Right 11/23/2014   Procedure: RIGHT MID FOOT OSTEOTOMY;  Surgeon: Toni Arthurs, MD;  Location: [facility];  Service: Orthopedics;  Laterality: Right;   ORIF ANKLE FRACTURE Left 05/16/2021   Procedure: OPEN REDUCTION INTERNAL FIXATION (ORIF) Left ankle lateral malleolus, possible deltoid ligament repair;  Surgeon: Toni Arthurs, MD;  Location: [facility];  Service:  Orthopedics;  Laterality: Left;   OSTEOTOMY Right 11/23/2014   mid foot   PARTIAL THYMECTOMY  1986   ? side   TONSILLECTOMY  1954   Patient Active Problem List   Diagnosis Date Noted   Closed low lateral malleolus fracture 05/16/2021   Gout 05/16/2021   Hyperlipemia 05/16/2021   Hypertension 05/16/2021   Type II diabetes mellitus (HCC) 05/16/2021   Spinal stenosis of lumbar region 03/10/2019   Cervical myelopathy (HCC) 03/10/2019   Peripheral neuropathy 02/02/2019   Gait abnormality 02/02/2019   Urinary urgency 02/02/2019   Chronic bilateral low back pain without sciatica 02/02/2019   DOE (dyspnea on exertion) 10/05/2017   Bruit of right carotid artery 10/05/2017   Coronary artery calcification seen on CT scan 10/05/2017   Chest pain 10/04/2017   Aortic atherosclerosis (HCC) 10/04/2017   Charcot foot due to diabetes mellitus (HCC) 11/23/2014    PCP: Jarrett Soho, PA  REFERRING PROVIDER: Delena Serve, PA  REFERRING DIAG: poor balance, weakness, difficulty walking  THERAPY DIAG:  Muscle weakness (generalized)  Unsteady gait  Difficulty in walking, not elsewhere classified  Rationale for Evaluation and Treatment: Rehabilitation  ONSET DATE: 11/03/22  SUBJECTIVE:    SUBJECTIVE STATEMENT:  I have seen better days. I think we did too much with the back the other day, I had a hard  time getting out of bed yesterday.    PERTINENT HISTORY: See above PAIN:  Are you having pain? Yes: NPRS scale: 0/10 Pain location: low back Pain description: tight, stabbing Aggravating factors: being on feet longer, twisting pain pain up to 6/10 Relieving factors: rest pain will go to 0/10  PRECAUTIONS: Fall  WEIGHT BEARING RESTRICTIONS: No  FALLS:  Has patient fallen in last 6 months? No, just unsteady  LIVING ENVIRONMENT: Lives with: alone Lives in: House/apartment Stairs: Yes: Internal: 12 steps; can reach both Has following equipment at home: Walker - 2  wheeled  OCCUPATION: retired  PLOF: uses walker for household mobility, does her cooking and cleaning using a FWW  PATIENT GOALS: may go to the beach in the fall, attend women's group, feel stronger, more steady, stay living at home independently  NEXT MD VISIT: none scheduled  OBJECTIVE:   DIAGNOSTIC FINDINGS: none  COGNITION: Overall cognitive status: Within functional limits for tasks assessed     SENSATION: Mid shin down very limited sensation light touch can feel some deeper touch  EDEMA:  Very mild at the ankles  MUSCLE LENGTH: Mild tightness in the HS  POSTURE: rounded shoulders and forward head  PALPATION: Tight and tender in the lumbar paraspinals  LOWER EXTREMITY ROM:  Active ROM Right eval Left eval  Hip flexion 80 80  Hip extension    Hip abduction    Hip adduction    Hip internal rotation    Hip external rotation    Knee flexion    Knee extension 0 0  Ankle dorsiflexion 0 0  Ankle plantarflexion    Ankle inversion    Ankle eversion     (Blank rows = not tested)  LOWER EXTREMITY MMT:  MMT Right eval Left eval  Hip flexion 4 4-  Hip extension 4- 4-  Hip abduction 4 4-  Hip adduction    Hip internal rotation    Hip external rotation    Knee flexion 4 4-  Knee extension 4 3+  Ankle dorsiflexion 2 0  Ankle plantarflexion 1 1  Ankle inversion 1 0  Ankle eversion 0 0   (Blank rows = not tested) FUNCTIONAL TESTS:  TUG: 40 seconds with FWW at evaluation walk test with CGA and FWW = 200 feet at evaluation Unable to stand without holding onto something  GAIT: Distance walked: 200 feet Assistive device utilized: Walker - 2 wheeled Level of assistance: SBA to start and then needs CGA Comments: foot drop bilaterally, decreased foot control, some hip drop on the left side, when fatigued poor safety with transfers   TODAY'S TREATMENT:                                                                                                                               DATE:  01/22/23 NuStep L5 x69mins  Walking outdoors halfway around [place] and up/down curbs  Seated marches green band 2x10 Seated hip abd green  band 2x10  Standing on reaching for sticky notes, then on airex  Standing in II bars no UE hold 6s at best 3# hip abd 2x10 3# hip ext x10- unable to do another set d/t fatigue  01/20/23 NuStep L5 x2mins  Standing in RW no UE use, 8s, 9s, 6s  - Then on airex 3s x5  Supine bridges x10, unable to do another set due to pain in low back  SLR 2x10  Feet on ball rotations and knees to chest x10  Double knees to chest 30s Single knees to chest 30s  Pball flexion roll outs x10 Step ups 6" on stairs x3 each side   01/15/23 Nustep level 5 x 7 minutes Gait outside 1/2 way around the parking [place] in the back, sit and rest and then around to the front of the building, 3 curbs, all with CGA except one time going down needed min A due to LOB backward 2 # hip extension and abduction Ball in lap isometric abs, cues needed to go slow and get good contraction Side steppping Seated volley ball Standing trying to find her balance very difficult for her due to neuropathy Sit to stand trying to have her not use the back of her legs with PT in front and her hands on my shoulders x 10  01/13/23 NuStep L5 x27mins  UBE L3 x26mins each way  Calf stretch 30s x2 Standing marches 3# ankle weights  Hip abd 3# 2x10  Leg ext 20# 3x10  HS curls 25# 3x10 Volleyball hit seated   01/07/23 NuStep L5 x40mins  Gait outdoors half the parking [place], sit and rest and then back in, CGA for the curbs Marching on airex  Standing on airex box taps 6"  STS on airex x10 Side steps on airex holding mat table  Fitter pushes 2 blue bands 2x10   01/06/23 NuStep L5 x76mins  Gait outdoors half the parking [place], sit and rest and then back in, CGA for the curbs Standing against mat table rows and ext red 2x10 Standing with RW heel taps 6"  Steps 4" OHP  red ball Leg ext 20# 2x10  HS curls 25# 2x10  01/01/23 Nustep level 5 x 5 minutes Gait outside with SBA x 250 feet and then 300 feet, CGA/Min A for curbs due to the difficulty she has with placing the walker and then with stepping up the curb 25# triceps with Mod A for balance 5# biceps with Mod A for balance Lats 20# Chest press 10# 2x10 25# leg curls 3x10 15# leg extension 3x10 Seated volleyball  12/30/22 Gait outside half the parking [place], sit and rest and then back in, CGA for the curbs Nustep level 5 x 7 minutes Leg press 40# both legs 2x10, then 20# single legs decreased ROM the left leg is very weak Leg curls 25# 2x10, single legs 15# ont he right and 10# on the left Leg extension 10# 2x10, right leg 10# x10, left leg 5# 2x6 2# hip abduction 2x10 2# marching  12/25/22 NuStep L5 x42mins  Walk around back parking [place]  Chest press 20# 2x10 Lat pulls downs 25# 2x10 Seated rows with band green 2x10  2# WaTe shoulder flexion 2x10 legs against mat table  STS on airex with RW in front 2x5   12/23/22 Walking around the back parking [place] 1 rest break Side stepping Worked on active DF bilateral ankles 6" toe touches with walker alternating Standing with walker hip abduction and extension STM to  the lumbar , thoracic Seated volleyball  12/18/22 Walking around [place], 1 rest break in between  NuStep L5 x80mins  Shoulder assessment - L shoulder flexion 85d - L shoulder abd 60d - L ER 40d  - MMT flexion 2+, abd 2-  5# hip flexion tapping 6" step in RW   Shoulder flexion and abd 1# 2x10   12/16/22 Nustep level 5 x 5 minutes Gait outside total of about 700-800 feet with 2 rests CGA, some min A down curbs, Mod A at times coming up curb Volley ball in sitting Leg curls 25# Lats 25#  12/11/22 Gait from front door around back to door 2 moments of instability and increased left foot drop noted Nustep L 5 6 min Standing on airex facing mat then lifting hands off for 5 sec  CG-Min A 10 x Side stepping at mat including stepping on and over airex 5 x each way- trouble with stepping on shoe Standing at elevated mat 1 hand on mat 1 HHA marching fwd and walking back 8 x STS from chair to elevated mat with UE use min A with cuing to push thru LE 2 sets 5  PATIENT EDUCATION:  Education details: POC Person educated: Patient Education method: Explanation Education comprehension: verbalized understanding  HOME EXERCISE PROGRAM: TBD  ASSESSMENT:  CLINICAL IMPRESSION: Patient reports back gave her some trouble after last visit. Requested to walk outside today. We worked on some standing balance today without support again, reports increase in back pain and needed a break. Fatigue at end of session with standing exercises.    OBJECTIVE IMPAIRMENTS: Abnormal gait, cardiopulmonary status limiting activity, decreased activity tolerance, decreased balance, decreased coordination, decreased endurance, decreased mobility, difficulty walking, decreased ROM, decreased strength, impaired flexibility, impaired sensation, and pain.   REHAB POTENTIAL: Good  CLINICAL DECISION MAKING: Stable/uncomplicated  EVALUATION COMPLEXITY: Low   GOALS: Goals reviewed with patient? Yes  SHORT TERM GOALS: Target date: 12/02/22 Independent with initial HEP Goal status: met 12/04/22  LONG TERM GOALS: Target date: 02/17/23  Independent with advanced HEP or gym program Goal status ongoing 01/15/23  2.  Decrease TUG time to 24 seconds Goal status:ongoing 12/16/22  3.  Walk 300 feet in 3 minute walk test Goal status: ongoing 12/23/22  4.  Be able to negotiate stairs and curbs with SBA with one at a time and a handrail Goal status: progressing 01/01/23, 01/22/23   5.  Report able to warm up a meal without sitting Goal status: 12/11/22 progressing, MET 01/22/23  6. Patient will demonstrate increase in shoulder ROM to 100d for flexion and abduction  Goal status: 85d flexion, 60d abd  7.  Patient will improve L shoulder strength with MMT 4/5   Goal status: 2+ for flexion and 2- for abduction   PLAN:  PT FREQUENCY: 1-2x/week  PT DURATION: 12 weeks  PLANNED INTERVENTIONS: Therapeutic exercises, Therapeutic activity, Neuromuscular re-education, Balance training, Gait training, Patient/Family education, Self Care, Joint mobilization, and Manual therapy  PLAN FOR NEXT SESSION: continue to add exercises for strength function and balance. Progress note    Cassie Freer, PT 01/22/2023, 5:15 PM [facility] [address] [place], [place], [address] Phone: [phone]   Fax:  [phone]

## 2023-01-22 ENCOUNTER — Ambulatory Visit: Payer: Medicare Other

## 2023-01-22 DIAGNOSIS — M6281 Muscle weakness (generalized): Secondary | ICD-10-CM

## 2023-01-22 DIAGNOSIS — R2681 Unsteadiness on feet: Secondary | ICD-10-CM | POA: Diagnosis not present

## 2023-01-22 DIAGNOSIS — R262 Difficulty in walking, not elsewhere classified: Secondary | ICD-10-CM | POA: Diagnosis not present

## 2023-01-26 NOTE — Therapy (Signed)
OUTPATIENT PHYSICAL THERAPY LOWER EXTREMITY TREATMENT Progress Note Reporting Period 12/23/22 to 01/27/23  See note below for Objective Data and Assessment of Progress/Goals.       Patient Name: Pamela Snyder MRN: [id number] DOB:[birth year], 77 y.o., female Today's Date: 01/27/2023  END OF SESSION:  PT End of Session - 01/27/23 1543     Visit Number 20    Date for PT Re-Evaluation 02/17/23    Authorization Type Medicare    PT Start Time 1540    PT Stop Time 1630    PT Time Calculation (min) 50 min    Equipment Utilized During Treatment Gait belt    Activity Tolerance Patient tolerated treatment well    Behavior During Therapy WFL for tasks assessed/performed                  Past Medical History:  Diagnosis Date   Anxiety    Arthritis    "right foot; spine; hands" (11/23/2014)   Charcot foot due to diabetes mellitus (HCC)    Depression    GERD (gastroesophageal reflux disease)    Gout    Hyperlipemia    Hypertension    Migraine    hx   Neuropathy    Numbness    Thyroid goiter 1986   Type II diabetes mellitus (HCC)    Past Surgical History:  Procedure Laterality Date   ABDOMINAL HYSTERECTOMY  1999   w/BSO   ACHILLES TENDON LENGTHENING Right 11/23/2014   ACHILLES TENDON LENGTHENING Right 11/23/2014   Procedure: RIGHT ACHILLES PERCUTANEOUS TENDON LENGTHENING;  Surgeon: Toni Arthurs, MD;  Location: [facility] OR;  Service: Orthopedics;  Laterality: Right;   APPENDECTOMY  1963   ARTHRODESIS Right 11/23/2014   mid foot   CARDIAC CATHETERIZATION  08/2004   FOOT ARTHRODESIS Right 11/23/2014   Procedure: RIGHT MID FOOT ARTHRODESIS;  Surgeon: Toni Arthurs, MD;  Location: [facility];  Service: Orthopedics;  Laterality: Right;   METATARSAL OSTEOTOMY Right 11/23/2014   Procedure: RIGHT MID FOOT OSTEOTOMY;  Surgeon: Toni Arthurs, MD;  Location: [facility];  Service: Orthopedics;  Laterality: Right;   ORIF ANKLE FRACTURE Left 05/16/2021   Procedure: OPEN REDUCTION INTERNAL FIXATION  (ORIF) Left ankle lateral malleolus, possible deltoid ligament repair;  Surgeon: Toni Arthurs, MD;  Location: [facility];  Service: Orthopedics;  Laterality: Left;   OSTEOTOMY Right 11/23/2014   mid foot   PARTIAL THYMECTOMY  1986   ? side   TONSILLECTOMY  1954   Patient Active Problem List   Diagnosis Date Noted   Closed low lateral malleolus fracture 05/16/2021   Gout 05/16/2021   Hyperlipemia 05/16/2021   Hypertension 05/16/2021   Type II diabetes mellitus (HCC) 05/16/2021   Spinal stenosis of lumbar region 03/10/2019   Cervical myelopathy (HCC) 03/10/2019   Peripheral neuropathy 02/02/2019   Gait abnormality 02/02/2019   Urinary urgency 02/02/2019   Chronic bilateral low back pain without sciatica 02/02/2019   DOE (dyspnea on exertion) 10/05/2017   Bruit of right carotid artery 10/05/2017   Coronary artery calcification seen on CT scan 10/05/2017   Chest pain 10/04/2017   Aortic atherosclerosis (HCC) 10/04/2017   Charcot foot due to diabetes mellitus (HCC) 11/23/2014    PCP: Jarrett Soho, PA  REFERRING PROVIDER: Delena Serve, PA  REFERRING DIAG: poor balance, weakness, difficulty walking  THERAPY DIAG:  Muscle weakness (generalized)  Unsteady gait  Difficulty in walking, not elsewhere classified  Rationale for Evaluation and Treatment: Rehabilitation  ONSET DATE: 11/03/22  SUBJECTIVE:    SUBJECTIVE STATEMENT:  I have seen better days. I think we did too much with the back the other day, I had a hard time getting out of bed yesterday.    PERTINENT HISTORY: See above PAIN:  Are you having pain? Yes: NPRS scale: 0/10 Pain location: low back Pain description: tight, stabbing Aggravating factors: being on feet longer, twisting pain pain up to 6/10 Relieving factors: rest pain will go to 0/10  PRECAUTIONS: Fall  WEIGHT BEARING RESTRICTIONS: No  FALLS:  Has patient fallen in last 6 months? No, just unsteady  LIVING ENVIRONMENT: Lives with: alone Lives in:  House/apartment Stairs: Yes: Internal: 12 steps; can reach both Has following equipment at home: Walker - 2 wheeled  OCCUPATION: retired  PLOF: uses walker for household mobility, does her cooking and cleaning using a FWW  PATIENT GOALS: may go to the beach in the fall, attend women's group, feel stronger, more steady, stay living at home independently  NEXT MD VISIT: none scheduled  OBJECTIVE:   DIAGNOSTIC FINDINGS: none  COGNITION: Overall cognitive status: Within functional limits for tasks assessed     SENSATION: Mid shin down very limited sensation light touch can feel some deeper touch  EDEMA:  Very mild at the ankles  MUSCLE LENGTH: Mild tightness in the HS  POSTURE: rounded shoulders and forward head  PALPATION: Tight and tender in the lumbar paraspinals  LOWER EXTREMITY ROM:  Active ROM Right eval Left eval  Hip flexion 80 80  Hip extension    Hip abduction    Hip adduction    Hip internal rotation    Hip external rotation    Knee flexion    Knee extension 0 0  Ankle dorsiflexion 0 0  Ankle plantarflexion    Ankle inversion    Ankle eversion     (Blank rows = not tested)  LOWER EXTREMITY MMT:  MMT Right eval Left eval  Hip flexion 4 4-  Hip extension 4- 4-  Hip abduction 4 4-  Hip adduction    Hip internal rotation    Hip external rotation    Knee flexion 4 4-  Knee extension 4 3+  Ankle dorsiflexion 2 0  Ankle plantarflexion 1 1  Ankle inversion 1 0  Ankle eversion 0 0   (Blank rows = not tested) FUNCTIONAL TESTS:  TUG: 40 seconds with FWW at evaluation walk test with CGA and FWW = 200 feet at evaluation Unable to stand without holding onto something  GAIT: Distance walked: 200 feet Assistive device utilized: Walker - 2 wheeled Level of assistance: SBA to start and then needs CGA Comments: foot drop bilaterally, decreased foot control, some hip drop on the left side, when fatigued poor safety with transfers   TODAY'S  TREATMENT:                                                                                                                              DATE:  01/27/23 TUG  17s  Walking 310ft in less than 3 mins  Stairs  Shoulder MMT and ROM recheck Leg ext 20# 3x10 HS curls 25# 3x10  Lat pull downs 25# 2x10 Chest press 20# 2x10 Seated rows with green band 2x10   01/22/23 NuStep L5 x45mins  Walking outdoors halfway around [place] and up/down curbs  Seated marches green band 2x10 Seated hip abd green band 2x10  Standing on reaching for sticky notes, then on airex  Standing in II bars no UE hold 6s at best 3# hip abd 2x10 3# hip ext x10- unable to do another set d/t fatigue  01/20/23 NuStep L5 x27mins  Standing in RW no UE use, 8s, 9s, 6s  - Then on airex 3s x5  Supine bridges x10, unable to do another set due to pain in low back  SLR 2x10  Feet on ball rotations and knees to chest x10  Double knees to chest 30s Single knees to chest 30s  Pball flexion roll outs x10 Step ups 6" on stairs x3 each side   01/15/23 Nustep level 5 x 7 minutes Gait outside 1/2 way around the parking [place] in the back, sit and rest and then around to the front of the building, 3 curbs, all with CGA except one time going down needed min A due to LOB backward 2 # hip extension and abduction Ball in lap isometric abs, cues needed to go slow and get good contraction Side steppping Seated volley ball Standing trying to find her balance very difficult for her due to neuropathy Sit to stand trying to have her not use the back of her legs with PT in front and her hands on my shoulders x 10  01/13/23 NuStep L5 x67mins  UBE L3 x21mins each way  Calf stretch 30s x2 Standing marches 3# ankle weights  Hip abd 3# 2x10  Leg ext 20# 3x10  HS curls 25# 3x10 Volleyball hit seated   01/07/23 NuStep L5 x17mins  Gait outdoors half the parking [place], sit and rest and then back in, CGA for the curbs Marching on airex  Standing on  airex box taps 6"  STS on airex x10 Side steps on airex holding mat table  Fitter pushes 2 blue bands 2x10   01/06/23 NuStep L5 x58mins  Gait outdoors half the parking [place], sit and rest and then back in, CGA for the curbs Standing against mat table rows and ext red 2x10 Standing with RW heel taps 6"  Steps 4" OHP red ball Leg ext 20# 2x10  HS curls 25# 2x10  01/01/23 Nustep level 5 x 5 minutes Gait outside with SBA x 250 feet and then 300 feet, CGA/Min A for curbs due to the difficulty she has with placing the walker and then with stepping up the curb 25# triceps with Mod A for balance 5# biceps with Mod A for balance Lats 20# Chest press 10# 2x10 25# leg curls 3x10 15# leg extension 3x10 Seated volleyball  12/30/22 Gait outside half the parking [place], sit and rest and then back in, CGA for the curbs Nustep level 5 x 7 minutes Leg press 40# both legs 2x10, then 20# single legs decreased ROM the left leg is very weak Leg curls 25# 2x10, single legs 15# ont he right and 10# on the left Leg extension 10# 2x10, right leg 10# x10, left leg 5# 2x6 2# hip abduction 2x10 2# marching  12/25/22 NuStep L5 x30mins  Walk around back parking [place]  Chest press 20#  2x10 Lat pulls downs 25# 2x10 Seated rows with band green 2x10  2# WaTe shoulder flexion 2x10 legs against mat table  STS on airex with RW in front 2x5   12/23/22 Walking around the back parking [place] 1 rest break Side stepping Worked on active DF bilateral ankles 6" toe touches with walker alternating Standing with walker hip abduction and extension STM to the lumbar , thoracic Seated volleyball  12/18/22 Walking around [place], 1 rest break in between  NuStep L5 x65mins  Shoulder assessment - L shoulder flexion 85d - L shoulder abd 60d - L ER 40d  - MMT flexion 2+, abd 2-  5# hip flexion tapping 6" step in RW   Shoulder flexion and abd 1# 2x10   12/16/22 Nustep level 5 x 5 minutes Gait outside total of  about 700-800 feet with 2 rests CGA, some min A down curbs, Mod A at times coming up curb Volley ball in sitting Leg curls 25# Lats 25#  12/11/22 Gait from front door around back to door 2 moments of instability and increased left foot drop noted Nustep L 5 6 min Standing on airex facing mat then lifting hands off for 5 sec CG-Min A 10 x Side stepping at mat including stepping on and over airex 5 x each way- trouble with stepping on shoe Standing at elevated mat 1 hand on mat 1 HHA marching fwd and walking back 8 x STS from chair to elevated mat with UE use min A with cuing to push thru LE 2 sets 5  PATIENT EDUCATION:  Education details: POC Person educated: Patient Education method: Explanation Education comprehension: verbalized understanding  HOME EXERCISE PROGRAM: TBD  ASSESSMENT:  CLINICAL IMPRESSION: Progress note complete. She has met some of her LTGs. Still needs work for her L shoulder, she is still very weak but made good progress with ROM. Will continue to benefit from strength and balance to increase her independence. Added goal for standing balance.    OBJECTIVE IMPAIRMENTS: Abnormal gait, cardiopulmonary status limiting activity, decreased activity tolerance, decreased balance, decreased coordination, decreased endurance, decreased mobility, difficulty walking, decreased ROM, decreased strength, impaired flexibility, impaired sensation, and pain.   REHAB POTENTIAL: Good  CLINICAL DECISION MAKING: Stable/uncomplicated  EVALUATION COMPLEXITY: Low   GOALS: Goals reviewed with patient? Yes  SHORT TERM GOALS: Target date: 12/02/22 Independent with initial HEP Goal status: met 12/04/22  LONG TERM GOALS: Target date: 02/17/23  Independent with advanced HEP or gym program Goal status ongoing 01/15/23  2.  Decrease TUG time to 24 seconds Goal status:ongoing 12/16/22, 17s MET 01/27/23  3.  Walk 300 feet in 3 minute walk test Goal status: ongoing 12/23/22, MET  01/27/23  4.  Be able to negotiate stairs and curbs with SBA with one at a time and a handrail Goal status: progressing 01/01/23, 01/27/23 progressing able to do on 4" but not 6"   5.  Report able to warm up a meal without sitting Goal status: 12/11/22 progressing, MET 01/22/23  6. Patient will demonstrate increase in shoulder ROM to 120d for flexion and abduction  Baseline: 85d flexion, 60d abd  Goal status: 115d flexion, 90d abd progressing  01/27/23  7. Patient will improve L shoulder strength with MMT 4/5   Baseline: 2+ for flexion and 2- for abduction  Goal status: progressing 01/27/23  8. Patient will be able to stand independently without UE support for 10s  Baseline:6s at best  Goal status: INITIAL    PLAN:  PT FREQUENCY:  1-2x/week  PT DURATION: 12 weeks  PLANNED INTERVENTIONS: Therapeutic exercises, Therapeutic activity, Neuromuscular re-education, Balance training, Gait training, Patient/Family education, Self Care, Joint mobilization, and Manual therapy  PLAN FOR NEXT SESSION: continue to add exercises for strength function and balance.    [address]  [place], PT 01/27/2023, 4:28 PM [place] [facility] [address] [facility]. [place], [place], [address] Phone: [phone]   Fax:  [phone]

## 2023-01-27 ENCOUNTER — Ambulatory Visit: Payer: Medicare Other

## 2023-01-27 DIAGNOSIS — M6281 Muscle weakness (generalized): Secondary | ICD-10-CM | POA: Diagnosis not present

## 2023-01-27 DIAGNOSIS — R262 Difficulty in walking, not elsewhere classified: Secondary | ICD-10-CM

## 2023-01-27 DIAGNOSIS — R2681 Unsteadiness on feet: Secondary | ICD-10-CM

## 2023-01-28 NOTE — Therapy (Signed)
OUTPATIENT PHYSICAL THERAPY LOWER EXTREMITY TREATMENT     Patient Name: Pamela Snyder MRN: [id number] DOB:09-14-1945, 77 y.o., female Today's Date: 01/29/2023  END OF SESSION:  PT End of Session - 01/29/23 1710     Visit Number 21    Date for PT Re-Evaluation 02/17/23    Authorization Type Medicare    PT Start Time 1710    PT Stop Time 1755    PT Time Calculation (min) 45 min    Equipment Utilized During Treatment Gait belt    Activity Tolerance Patient tolerated treatment well    Behavior During Therapy WFL for tasks assessed/performed                   Past Medical History:  Diagnosis Date   Anxiety    Arthritis    "right foot; spine; hands" (11/23/2014)   Charcot foot due to diabetes mellitus (HCC)    Depression    GERD (gastroesophageal reflux disease)    Gout    Hyperlipemia    Hypertension    Migraine    hx   Neuropathy    Numbness    Thyroid goiter 1986   Type II diabetes mellitus (HCC)    Past Surgical History:  Procedure Laterality Date   ABDOMINAL HYSTERECTOMY  1999   w/BSO   ACHILLES TENDON LENGTHENING Right 11/23/2014   ACHILLES TENDON LENGTHENING Right 11/23/2014   Procedure: RIGHT ACHILLES PERCUTANEOUS TENDON LENGTHENING;  Surgeon: Toni Arthurs, MD;  Location: [facility] OR;  Service: Orthopedics;  Laterality: Right;   APPENDECTOMY  1963   ARTHRODESIS Right 11/23/2014   mid foot   CARDIAC CATHETERIZATION  08/2004   FOOT ARTHRODESIS Right 11/23/2014   Procedure: RIGHT MID FOOT ARTHRODESIS;  Surgeon: Toni Arthurs, MD;  Location: [facility];  Service: Orthopedics;  Laterality: Right;   METATARSAL OSTEOTOMY Right 11/23/2014   Procedure: RIGHT MID FOOT OSTEOTOMY;  Surgeon: Toni Arthurs, MD;  Location: [facility];  Service: Orthopedics;  Laterality: Right;   ORIF ANKLE FRACTURE Left 05/16/2021   Procedure: OPEN REDUCTION INTERNAL FIXATION (ORIF) Left ankle lateral malleolus, possible deltoid ligament repair;  Surgeon: Toni Arthurs, MD;  Location: [facility];  Service:  Orthopedics;  Laterality: Left;   OSTEOTOMY Right 11/23/2014   mid foot   PARTIAL THYMECTOMY  1986   ? side   TONSILLECTOMY  1954   Patient Active Problem List   Diagnosis Date Noted   Closed low lateral malleolus fracture 05/16/2021   Gout 05/16/2021   Hyperlipemia 05/16/2021   Hypertension 05/16/2021   Type II diabetes mellitus (HCC) 05/16/2021   Spinal stenosis of lumbar region 03/10/2019   Cervical myelopathy (HCC) 03/10/2019   Peripheral neuropathy 02/02/2019   Gait abnormality 02/02/2019   Urinary urgency 02/02/2019   Chronic bilateral low back pain without sciatica 02/02/2019   DOE (dyspnea on exertion) 10/05/2017   Bruit of right carotid artery 10/05/2017   Coronary artery calcification seen on CT scan 10/05/2017   Chest pain 10/04/2017   Aortic atherosclerosis (HCC) 10/04/2017   Charcot foot due to diabetes mellitus (HCC) 11/23/2014    PCP: Jarrett Soho, PA  REFERRING PROVIDER: Delena Serve, PA  REFERRING DIAG: poor balance, weakness, difficulty walking  THERAPY DIAG:  Muscle weakness (generalized)  Unsteady gait  Difficulty in walking, not elsewhere classified  Rationale for Evaluation and Treatment: Rehabilitation  ONSET DATE: 11/03/22  SUBJECTIVE:    SUBJECTIVE STATEMENT: doing fine.    PERTINENT HISTORY: See above PAIN:  Are you having pain? Yes: NPRS scale: 0/10  Pain location: low back Pain description: tight, stabbing Aggravating factors: being on feet longer, twisting pain pain up to 6/10 Relieving factors: rest pain will go to 0/10  PRECAUTIONS: Fall  WEIGHT BEARING RESTRICTIONS: No  FALLS:  Has patient fallen in last 6 months? No, just unsteady  LIVING ENVIRONMENT: Lives with: alone Lives in: House/apartment Stairs: Yes: Internal: 12 steps; can reach both Has following equipment at home: Walker - 2 wheeled  OCCUPATION: retired  PLOF: uses walker for household mobility, does her cooking and cleaning using a FWW  PATIENT GOALS:  may go to the beach in the fall, attend women's group, feel stronger, more steady, stay living at home independently  NEXT MD VISIT: none scheduled  OBJECTIVE:   DIAGNOSTIC FINDINGS: none  COGNITION: Overall cognitive status: Within functional limits for tasks assessed     SENSATION: Mid shin down very limited sensation light touch can feel some deeper touch  EDEMA:  Very mild at the ankles  MUSCLE LENGTH: Mild tightness in the HS  POSTURE: rounded shoulders and forward head  PALPATION: Tight and tender in the lumbar paraspinals  LOWER EXTREMITY ROM:  Active ROM Right eval Left eval  Hip flexion 80 80  Hip extension    Hip abduction    Hip adduction    Hip internal rotation    Hip external rotation    Knee flexion    Knee extension 0 0  Ankle dorsiflexion 0 0  Ankle plantarflexion    Ankle inversion    Ankle eversion     (Blank rows = not tested)  LOWER EXTREMITY MMT:  MMT Right eval Left eval  Hip flexion 4 4-  Hip extension 4- 4-  Hip abduction 4 4-  Hip adduction    Hip internal rotation    Hip external rotation    Knee flexion 4 4-  Knee extension 4 3+  Ankle dorsiflexion 2 0  Ankle plantarflexion 1 1  Ankle inversion 1 0  Ankle eversion 0 0   (Blank rows = not tested) FUNCTIONAL TESTS:  TUG: 40 seconds with FWW at evaluation walk test with CGA and FWW = 200 feet at evaluation Unable to stand without holding onto something  GAIT: Distance walked: 200 feet Assistive device utilized: Walker - 2 wheeled Level of assistance: SBA to start and then needs CGA Comments: foot drop bilaterally, decreased foot control, some hip drop on the left side, when fatigued poor safety with transfers   TODAY'S TREATMENT:                                                                                                                              DATE:  01/29/23 NuStep L5 x31mins  Walking outdoors Seated shoulder flexion #1 2x10 Seated shoulder abd #1  2x10 Fitter pushes 2 blue bands 2x10  Volleyball    01/27/23 TUG 17s  Walking 382ft in less than 3 mins  Stairs  Shoulder MMT and ROM  recheck Leg ext 20# 3x10 HS curls 25# 3x10  Lat pull downs 25# 2x10 Chest press 20# 2x10 Seated rows with green band 2x10   01/22/23 NuStep L5 x48mins  Walking outdoors halfway around [place] and up/down curbs  Seated marches green band 2x10 Seated hip abd green band 2x10  Standing on reaching for sticky notes, then on airex  Standing in II bars no UE hold 6s at best 3# hip abd 2x10 3# hip ext x10- unable to do another set d/t fatigue  01/20/23 NuStep L5 x58mins  Standing in RW no UE use, 8s, 9s, 6s  - Then on airex 3s x5  Supine bridges x10, unable to do another set due to pain in low back  SLR 2x10  Feet on ball rotations and knees to chest x10  Double knees to chest 30s Single knees to chest 30s  Pball flexion roll outs x10 Step ups 6" on stairs x3 each side   01/15/23 Nustep level 5 x 7 minutes Gait outside 1/2 way around the parking [place] in the back, sit and rest and then around to the front of the building, 3 curbs, all with CGA except one time going down needed min A due to LOB backward 2 # hip extension and abduction Ball in lap isometric abs, cues needed to go slow and get good contraction Side steppping Seated volley ball Standing trying to find her balance very difficult for her due to neuropathy Sit to stand trying to have her not use the back of her legs with PT in front and her hands on my shoulders x 10  01/13/23 NuStep L5 x62mins  UBE L3 x73mins each way  Calf stretch 30s x2 Standing marches 3# ankle weights  Hip abd 3# 2x10  Leg ext 20# 3x10  HS curls 25# 3x10 Volleyball hit seated   01/07/23 NuStep L5 x36mins  Gait outdoors half the parking [place], sit and rest and then back in, CGA for the curbs Marching on airex  Standing on airex box taps 6"  STS on airex x10 Side steps on airex holding mat table  Fitter  pushes 2 blue bands 2x10   01/06/23 NuStep L5 x65mins  Gait outdoors half the parking [place], sit and rest and then back in, CGA for the curbs Standing against mat table rows and ext red 2x10 Standing with RW heel taps 6"  Steps 4" OHP red ball Leg ext 20# 2x10  HS curls 25# 2x10  01/01/23 Nustep level 5 x 5 minutes Gait outside with SBA x 250 feet and then 300 feet, CGA/Min A for curbs due to the difficulty she has with placing the walker and then with stepping up the curb 25# triceps with Mod A for balance 5# biceps with Mod A for balance Lats 20# Chest press 10# 2x10 25# leg curls 3x10 15# leg extension 3x10 Seated volleyball  12/30/22 Gait outside half the parking [place], sit and rest and then back in, CGA for the curbs Nustep level 5 x 7 minutes Leg press 40# both legs 2x10, then 20# single legs decreased ROM the left leg is very weak Leg curls 25# 2x10, single legs 15# ont he right and 10# on the left Leg extension 10# 2x10, right leg 10# x10, left leg 5# 2x6 2# hip abduction 2x10 2# marching  12/25/22 NuStep L5 x39mins  Walk around back parking [place]  Chest press 20# 2x10 Lat pulls downs 25# 2x10 Seated rows with band green 2x10  2# WaTe shoulder  flexion 2x10 legs against mat table  STS on airex with RW in front 2x5   12/23/22 Walking around the back parking [place] 1 rest break Side stepping Worked on active DF bilateral ankles 6" toe touches with walker alternating Standing with walker hip abduction and extension STM to the lumbar , thoracic Seated volleyball  12/18/22 Walking around [place], 1 rest break in between  NuStep L5 x85mins  Shoulder assessment - L shoulder flexion 85d - L shoulder abd 60d - L ER 40d  - MMT flexion 2+, abd 2-  5# hip flexion tapping 6" step in RW   Shoulder flexion and abd 1# 2x10   12/16/22 Nustep level 5 x 5 minutes Gait outside total of about 700-800 feet with 2 rests CGA, some min A down curbs, Mod A at times coming up  curb Volley ball in sitting Leg curls 25# Lats 25#  12/11/22 Gait from front door around back to door 2 moments of instability and increased left foot drop noted Nustep L 5 6 min Standing on airex facing mat then lifting hands off for 5 sec CG-Min A 10 x Side stepping at mat including stepping on and over airex 5 x each way- trouble with stepping on shoe Standing at elevated mat 1 hand on mat 1 HHA marching fwd and walking back 8 x STS from chair to elevated mat with UE use min A with cuing to push thru LE 2 sets 5  PATIENT EDUCATION:  Education details: POC Person educated: Patient Education method: Explanation Education comprehension: verbalized understanding  HOME EXERCISE PROGRAM: TBD  ASSESSMENT:  CLINICAL IMPRESSION: Patient is doing well, reports some fatigue today but feeling good. Continued to benefit from strength and balance to increase her independence. Worked on some strengthening for shoulder today, very hard and limited with motion.    OBJECTIVE IMPAIRMENTS: Abnormal gait, cardiopulmonary status limiting activity, decreased activity tolerance, decreased balance, decreased coordination, decreased endurance, decreased mobility, difficulty walking, decreased ROM, decreased strength, impaired flexibility, impaired sensation, and pain.   REHAB POTENTIAL: Good  CLINICAL DECISION MAKING: Stable/uncomplicated  EVALUATION COMPLEXITY: Low   GOALS: Goals reviewed with patient? Yes  SHORT TERM GOALS: Target date: 12/02/22 Independent with initial HEP Goal status: met 12/04/22  LONG TERM GOALS: Target date: 02/17/23  Independent with advanced HEP or gym program Goal status ongoing 01/15/23  2.  Decrease TUG time to 24 seconds Goal status:ongoing 12/16/22, 17s MET 01/27/23  3.  Walk 300 feet in 3 minute walk test Goal status: ongoing 12/23/22, MET 01/27/23  4.  Be able to negotiate stairs and curbs with SBA with one at a time and a handrail Goal status: progressing  01/01/23, 01/27/23 progressing able to do on 4" but not 6"   5.  Report able to warm up a meal without sitting Goal status: 12/11/22 progressing, MET 01/22/23  6. Patient will demonstrate increase in shoulder ROM to 120d for flexion and abduction  Baseline: 85d flexion, 60d abd  Goal status: 115d flexion, 90d abd progressing  01/27/23  7. Patient will improve L shoulder strength with MMT 4/5   Baseline: 2+ for flexion and 2- for abduction  Goal status: progressing 01/27/23  8. Patient will be able to stand independently without UE support for 10s  Baseline:6s at best  Goal status: INITIAL    PLAN:  PT FREQUENCY: 1-2x/week  PT DURATION: 12 weeks  PLANNED INTERVENTIONS: Therapeutic exercises, Therapeutic activity, Neuromuscular re-education, Balance training, Gait training, Patient/Family education, Self Care, Joint mobilization, and  Manual therapy  PLAN FOR NEXT SESSION: continue to add exercises for strength function and balance.    Cassie Freer, [place] 01/29/2023, 6:01 PM [facility] [address] [facility] [place], [place], [address] Phone: [phone]   Fax:  [phone]

## 2023-01-29 ENCOUNTER — Ambulatory Visit: Payer: Medicare Other

## 2023-01-29 DIAGNOSIS — R262 Difficulty in walking, not elsewhere classified: Secondary | ICD-10-CM

## 2023-01-29 DIAGNOSIS — R2681 Unsteadiness on feet: Secondary | ICD-10-CM

## 2023-01-29 DIAGNOSIS — M6281 Muscle weakness (generalized): Secondary | ICD-10-CM | POA: Diagnosis not present

## 2023-02-02 NOTE — Therapy (Signed)
OUTPATIENT PHYSICAL THERAPY LOWER EXTREMITY TREATMENT     Patient Name: Pamela Snyder MRN: [id number] DOB:11/26/1945, 77 y.o., female Today's Date: 02/03/2023  END OF SESSION:  PT End of Session - 02/03/23 1546     Visit Number 22    Date for PT Re-Evaluation 02/17/23    Authorization Type Medicare    PT Start Time 1545    PT Stop Time 1700    PT Time Calculation (min) 75 min    Equipment Utilized During Treatment Gait belt    Activity Tolerance Patient tolerated treatment well    Behavior During Therapy WFL for tasks assessed/performed                    Past Medical History:  Diagnosis Date   Anxiety    Arthritis    "right foot; spine; hands" (11/23/2014)   Charcot foot due to diabetes mellitus (HCC)    Depression    GERD (gastroesophageal reflux disease)    Gout    Hyperlipemia    Hypertension    Migraine    hx   Neuropathy    Numbness    Thyroid goiter 1986   Type II diabetes mellitus (HCC)    Past Surgical History:  Procedure Laterality Date   ABDOMINAL HYSTERECTOMY  1999   w/BSO   ACHILLES TENDON LENGTHENING Right 11/23/2014   ACHILLES TENDON LENGTHENING Right 11/23/2014   Procedure: RIGHT ACHILLES PERCUTANEOUS TENDON LENGTHENING;  Surgeon: Toni Arthurs, MD;  Location: [facility] OR;  Service: Orthopedics;  Laterality: Right;   APPENDECTOMY  1963   ARTHRODESIS Right 11/23/2014   mid foot   CARDIAC CATHETERIZATION  08/2004   FOOT ARTHRODESIS Right 11/23/2014   Procedure: RIGHT MID FOOT ARTHRODESIS;  Surgeon: Toni Arthurs, MD;  Location: [facility] OR;  Service: Orthopedics;  Laterality: Right;   METATARSAL OSTEOTOMY Right 11/23/2014   Procedure: RIGHT MID FOOT OSTEOTOMY;  Surgeon: Toni Arthurs, MD;  Location: [facility];  Service: Orthopedics;  Laterality: Right;   ORIF ANKLE FRACTURE Left 05/16/2021   Procedure: OPEN REDUCTION INTERNAL FIXATION (ORIF) Left ankle lateral malleolus, possible deltoid ligament repair;  Surgeon: Toni Arthurs, MD;  Location: [facility];  Service:  Orthopedics;  Laterality: Left;   OSTEOTOMY Right 11/23/2014   mid foot   PARTIAL THYMECTOMY  1986   ? side   TONSILLECTOMY  1954   Patient Active Problem List   Diagnosis Date Noted   Closed low lateral malleolus fracture 05/16/2021   Gout 05/16/2021   Hyperlipemia 05/16/2021   Hypertension 05/16/2021   Type II diabetes mellitus (HCC) 05/16/2021   Spinal stenosis of lumbar region 03/10/2019   Cervical myelopathy (HCC) 03/10/2019   Peripheral neuropathy 02/02/2019   Gait abnormality 02/02/2019   Urinary urgency 02/02/2019   Chronic bilateral low back pain without sciatica 02/02/2019   DOE (dyspnea on exertion) 10/05/2017   Bruit of right carotid artery 10/05/2017   Coronary artery calcification seen on CT scan 10/05/2017   Chest pain 10/04/2017   Aortic atherosclerosis (HCC) 10/04/2017   Charcot foot due to diabetes mellitus (HCC) 11/23/2014    PCP: Jarrett Soho, PA  REFERRING PROVIDER: Delena Serve, PA  REFERRING DIAG: poor balance, weakness, difficulty walking  THERAPY DIAG:  Muscle weakness (generalized)  Unsteady gait  Difficulty in walking, not elsewhere classified  Rationale for Evaluation and Treatment: Rehabilitation  ONSET DATE: 11/03/22  SUBJECTIVE:    SUBJECTIVE STATEMENT: I am tired.    PERTINENT HISTORY: See above PAIN:  Are you having pain? Yes: NPRS  scale: 0/10 Pain location: low back Pain description: tight, stabbing Aggravating factors: being on feet longer, twisting pain pain up to 6/10 Relieving factors: rest pain will go to 0/10  PRECAUTIONS: Fall  WEIGHT BEARING RESTRICTIONS: No  FALLS:  Has patient fallen in last 6 months? No, just unsteady  LIVING ENVIRONMENT: Lives with: alone Lives in: House/apartment Stairs: Yes: Internal: 12 steps; can reach both Has following equipment at home: Walker - 2 wheeled  OCCUPATION: retired  PLOF: uses walker for household mobility, does her cooking and cleaning using a FWW  PATIENT GOALS:  may go to the beach in the fall, attend women's group, feel stronger, more steady, stay living at home independently  NEXT MD VISIT: none scheduled  OBJECTIVE:   DIAGNOSTIC FINDINGS: none  COGNITION: Overall cognitive status: Within functional limits for tasks assessed     SENSATION: Mid shin down very limited sensation light touch can feel some deeper touch  EDEMA:  Very mild at the ankles  MUSCLE LENGTH: Mild tightness in the HS  POSTURE: rounded shoulders and forward head  PALPATION: Tight and tender in the lumbar paraspinals  LOWER EXTREMITY ROM:  Active ROM Right eval Left eval  Hip flexion 80 80  Hip extension    Hip abduction    Hip adduction    Hip internal rotation    Hip external rotation    Knee flexion    Knee extension 0 0  Ankle dorsiflexion 0 0  Ankle plantarflexion    Ankle inversion    Ankle eversion     (Blank rows = not tested)  LOWER EXTREMITY MMT:  MMT Right eval Left eval  Hip flexion 4 4-  Hip extension 4- 4-  Hip abduction 4 4-  Hip adduction    Hip internal rotation    Hip external rotation    Knee flexion 4 4-  Knee extension 4 3+  Ankle dorsiflexion 2 0  Ankle plantarflexion 1 1  Ankle inversion 1 0  Ankle eversion 0 0   (Blank rows = not tested) FUNCTIONAL TESTS:  TUG: 40 seconds with FWW at evaluation walk test with CGA and FWW = 200 feet at evaluation Unable to stand without holding onto something  GAIT: Distance walked: 200 feet Assistive device utilized: Walker - 2 wheeled Level of assistance: SBA to start and then needs CGA Comments: foot drop bilaterally, decreased foot control, some hip drop on the left side, when fatigued poor safety with transfers   TODAY'S TREATMENT:                                                                                                                              DATE:  02/03/23 NuStep L5 x10 mins  Leg ext 20# 3x10 HS curls 25# 3x10  Leg press 20# 3x10  Lat pull  down 20# 3x10 Chest press 20# 3x10  blackTB flexion 2x20 Rows and ext with band legs against table 2x10 Step ups  x10    01/29/23 NuStep L5 x57mins  Walking outdoors Seated shoulder flexion #1 2x10 Seated shoulder abd #1 2x10 Fitter pushes 2 blue bands 2x10  Volleyball    01/27/23 TUG 17s  Walking 396ft in less than 3 mins  Stairs  Shoulder MMT and ROM recheck Leg ext 20# 3x10 HS curls 25# 3x10  Lat pull downs 25# 2x10 Chest press 20# 2x10 Seated rows with green band 2x10   01/22/23 NuStep L5 x23mins  Walking outdoors halfway around [place] and up/down curbs  Seated marches green band 2x10 Seated hip abd green band 2x10  Standing on reaching for sticky notes, then on airex  Standing in II bars no UE hold 6s at best 3# hip abd 2x10 3# hip ext x10- unable to do another set d/t fatigue  01/20/23 NuStep L5 x9mins  Standing in RW no UE use, 8s, 9s, 6s  - Then on airex 3s x5  Supine bridges x10, unable to do another set due to pain in low back  SLR 2x10  Feet on ball rotations and knees to chest x10  Double knees to chest 30s Single knees to chest 30s  Pball flexion roll outs x10 Step ups 6" on stairs x3 each side   01/15/23 Nustep level 5 x 7 minutes Gait outside 1/2 way around the parking [place] in the back, sit and rest and then around to the front of the building, 3 curbs, all with CGA except one time going down needed min A due to LOB backward 2 # hip extension and abduction Ball in lap isometric abs, cues needed to go slow and get good contraction Side steppping Seated volley ball Standing trying to find her balance very difficult for her due to neuropathy Sit to stand trying to have her not use the back of her legs with PT in front and her hands on my shoulders x 10  01/13/23 NuStep L5 x31mins  UBE L3 x32mins each way  Calf stretch 30s x2 Standing marches 3# ankle weights  Hip abd 3# 2x10  Leg ext 20# 3x10  HS curls 25# 3x10 Volleyball hit  seated   01/07/23 NuStep L5 x65mins  Gait outdoors half the parking [place], sit and rest and then back in, CGA for the curbs Marching on airex  Standing on airex box taps 6"  STS on airex x10 Side steps on airex holding mat table  Fitter pushes 2 blue bands 2x10   01/06/23 NuStep L5 x13mins  Gait outdoors half the parking [place], sit and rest and then back in, CGA for the curbs Standing against mat table rows and ext red 2x10 Standing with RW heel taps 6"  Steps 4" OHP red ball Leg ext 20# 2x10  HS curls 25# 2x10  01/01/23 Nustep level 5 x 5 minutes Gait outside with SBA x 250 feet and then 300 feet, CGA/Min A for curbs due to the difficulty she has with placing the walker and then with stepping up the curb 25# triceps with Mod A for balance 5# biceps with Mod A for balance Lats 20# Chest press 10# 2x10 25# leg curls 3x10 15# leg extension 3x10 Seated volleyball  12/30/22 Gait outside half the parking [place], sit and rest and then back in, CGA for the curbs Nustep level 5 x 7 minutes Leg press 40# both legs 2x10, then 20# single legs decreased ROM the left leg is very weak Leg curls 25# 2x10, single legs 15# ont he right and 10# on the  left Leg extension 10# 2x10, right leg 10# x10, left leg 5# 2x6 2# hip abduction 2x10 2# marching  12/25/22 NuStep L5 x74mins  Walk around back [place]  Chest press 20# 2x10 Lat pulls downs 25# 2x10 Seated rows with band green 2x10  2# WaTe shoulder flexion 2x10 legs against mat table  STS on airex with RW in front 2x5   12/23/22 Walking around the back parking [place] 1 rest break Side stepping Worked on active DF bilateral ankles 6" toe touches with walker alternating Standing with walker hip abduction and extension STM to the lumbar , thoracic Seated volleyball  12/18/22 Walking around [place], 1 rest break in between  NuStep L5 x100mins  Shoulder assessment - L shoulder flexion 85d - L shoulder abd 60d - L ER 40d  - MMT  flexion 2+, abd 2-  5# hip flexion tapping 6" step in RW   Shoulder flexion and abd 1# 2x10   12/16/22 Nustep level 5 x 5 minutes Gait outside total of about 700-800 feet with 2 rests CGA, some min A down curbs, Mod A at times coming up curb Volley ball in sitting Leg curls 25# Lats 25#  12/11/22 Gait from front door around back to door 2 moments of instability and increased left foot drop noted Nustep L 5 6 min Standing on airex facing mat then lifting hands off for 5 sec CG-Min A 10 x Side stepping at mat including stepping on and over airex 5 x each way- trouble with stepping on shoe Standing at elevated mat 1 hand on mat 1 HHA marching fwd and walking back 8 x STS from chair to elevated mat with UE use min A with cuing to push thru LE 2 sets 5  PATIENT EDUCATION:  Education details: POC Person educated: Patient Education method: Explanation Education comprehension: verbalized understanding  HOME EXERCISE PROGRAM: TBD  ASSESSMENT:  CLINICAL IMPRESSION: Patient is doing well, reports some fatigue today but feeling good. Continued to benefit from strength and balance to increase her independence. Had additional time today so we took advantage and increased her sets.    OBJECTIVE IMPAIRMENTS: Abnormal gait, cardiopulmonary status limiting activity, decreased activity tolerance, decreased balance, decreased coordination, decreased endurance, decreased mobility, difficulty walking, decreased ROM, decreased strength, impaired flexibility, impaired sensation, and pain.   REHAB POTENTIAL: Good  CLINICAL DECISION MAKING: Stable/uncomplicated  EVALUATION COMPLEXITY: Low   GOALS: Goals reviewed with patient? Yes  SHORT TERM GOALS: Target date: 12/02/22 Independent with initial HEP Goal status: met 12/04/22  LONG TERM GOALS: Target date: 02/17/23  Independent with advanced HEP or gym program Goal status ongoing 01/15/23  2.  Decrease TUG time to 24 seconds Goal status:ongoing  12/16/22, 17s MET 01/27/23  3.  Walk 300 feet in 3 minute walk test Goal status: ongoing 12/23/22, MET 01/27/23  4.  Be able to negotiate stairs and curbs with SBA with one at a time and a handrail Goal status: progressing 01/01/23, 01/27/23 progressing able to do on 4" but not 6"   5.  Report able to warm up a meal without sitting Goal status: 12/11/22 progressing, MET 01/22/23  6. Patient will demonstrate increase in shoulder ROM to 120d for flexion and abduction  Baseline: 85d flexion, 60d abd  Goal status: 115d flexion, 90d abd progressing  01/27/23  7. Patient will improve L shoulder strength with MMT 4/5   Baseline: 2+ for flexion and 2- for abduction  Goal status: progressing 01/27/23  8. Patient will be able  to stand independently without UE support for 10s  Baseline:6s at best  Goal status: INITIAL    PLAN:  PT FREQUENCY: 1-2x/week  PT DURATION: 12 weeks  PLANNED INTERVENTIONS: Therapeutic exercises, Therapeutic activity, Neuromuscular re-education, Balance training, Gait training, Patient/Family education, Self Care, Joint mobilization, and Manual therapy  PLAN FOR NEXT SESSION: continue to add exercises for strength function and balance.    Cassie Freer, PT 02/03/2023, 4:58 PM [facility] [place], [place], [address] Phone: [phone]   Fax:  [phone]

## 2023-02-03 ENCOUNTER — Ambulatory Visit: Payer: Medicare Other

## 2023-02-03 DIAGNOSIS — R262 Difficulty in walking, not elsewhere classified: Secondary | ICD-10-CM | POA: Diagnosis not present

## 2023-02-03 DIAGNOSIS — R2681 Unsteadiness on feet: Secondary | ICD-10-CM | POA: Diagnosis not present

## 2023-02-03 DIAGNOSIS — M6281 Muscle weakness (generalized): Secondary | ICD-10-CM | POA: Diagnosis not present

## 2023-02-04 NOTE — Therapy (Signed)
OUTPATIENT PHYSICAL THERAPY LOWER EXTREMITY TREATMENT     Patient Name: Pamela Snyder MRN: [id number] DOB:1946-05-13, 77 y.o., female Today's Date: 02/05/2023  END OF SESSION:  PT End of Session - 02/05/23 1714     Visit Number 23    Date for PT Re-Evaluation 03/05/23    Authorization Type Medicare    PT Start Time 1712    PT Stop Time 1755    PT Time Calculation (min) 43 min    Equipment Utilized During Treatment Gait belt    Activity Tolerance Patient tolerated treatment well    Behavior During Therapy WFL for tasks assessed/performed                     Past Medical History:  Diagnosis Date   Anxiety    Arthritis    "right foot; spine; hands" (11/23/2014)   Charcot foot due to diabetes mellitus (HCC)    Depression    GERD (gastroesophageal reflux disease)    Gout    Hyperlipemia    Hypertension    Migraine    hx   Neuropathy    Numbness    Thyroid goiter 1986   Type II diabetes mellitus (HCC)    Past Surgical History:  Procedure Laterality Date   ABDOMINAL HYSTERECTOMY  1999   w/BSO   ACHILLES TENDON LENGTHENING Right 11/23/2014   ACHILLES TENDON LENGTHENING Right 11/23/2014   Procedure: RIGHT ACHILLES PERCUTANEOUS TENDON LENGTHENING;  Surgeon: Toni Arthurs, MD;  Location: [facility] OR;  Service: Orthopedics;  Laterality: Right;   APPENDECTOMY  1963   ARTHRODESIS Right 11/23/2014   mid foot   CARDIAC CATHETERIZATION  08/2004   FOOT ARTHRODESIS Right 11/23/2014   Procedure: RIGHT MID FOOT ARTHRODESIS;  Surgeon: Toni Arthurs, MD;  Location: [facility];  Service: Orthopedics;  Laterality: Right;   METATARSAL OSTEOTOMY Right 11/23/2014   Procedure: RIGHT MID FOOT OSTEOTOMY;  Surgeon: Toni Arthurs, MD;  Location: [facility];  Service: Orthopedics;  Laterality: Right;   ORIF ANKLE FRACTURE Left 05/16/2021   Procedure: OPEN REDUCTION INTERNAL FIXATION (ORIF) Left ankle lateral malleolus, possible deltoid ligament repair;  Surgeon: Toni Arthurs, MD;  Location: [facility];   Service: Orthopedics;  Laterality: Left;   OSTEOTOMY Right 11/23/2014   mid foot   PARTIAL THYMECTOMY  1986   ? side   TONSILLECTOMY  1954   Patient Active Problem List   Diagnosis Date Noted   Closed low lateral malleolus fracture 05/16/2021   Gout 05/16/2021   Hyperlipemia 05/16/2021   Hypertension 05/16/2021   Type II diabetes mellitus (HCC) 05/16/2021   Spinal stenosis of lumbar region 03/10/2019   Cervical myelopathy (HCC) 03/10/2019   Peripheral neuropathy 02/02/2019   Gait abnormality 02/02/2019   Urinary urgency 02/02/2019   Chronic bilateral low back pain without sciatica 02/02/2019   DOE (dyspnea on exertion) 10/05/2017   Bruit of right carotid artery 10/05/2017   Coronary artery calcification seen on CT scan 10/05/2017   Chest pain 10/04/2017   Aortic atherosclerosis (HCC) 10/04/2017   Charcot foot due to diabetes mellitus (HCC) 11/23/2014    PCP: Jarrett Soho, PA  REFERRING PROVIDER: Delena Serve, PA  REFERRING DIAG: poor balance, weakness, difficulty walking  THERAPY DIAG:  Muscle weakness (generalized)  Unsteady gait  Difficulty in walking, not elsewhere classified  Rationale for Evaluation and Treatment: Rehabilitation  ONSET DATE: 11/03/22  SUBJECTIVE:    SUBJECTIVE STATEMENT: I am doing good.    PERTINENT HISTORY: See above PAIN:  Are you having pain?  Yes: NPRS scale: 0/10 Pain location: low back Pain description: tight, stabbing Aggravating factors: being on feet longer, twisting pain pain up to 6/10 Relieving factors: rest pain will go to 0/10  PRECAUTIONS: Fall  WEIGHT BEARING RESTRICTIONS: No  FALLS:  Has patient fallen in last 6 months? No, just unsteady  LIVING ENVIRONMENT: Lives with: alone Lives in: House/apartment Stairs: Yes: Internal: 12 steps; can reach both Has following equipment at home: Walker - 2 wheeled  OCCUPATION: retired  PLOF: uses walker for household mobility, does her cooking and cleaning using a  FWW  PATIENT GOALS: may go to the beach in the fall, attend women's group, feel stronger, more steady, stay living at home independently  NEXT MD VISIT: none scheduled  OBJECTIVE:   DIAGNOSTIC FINDINGS: none  COGNITION: Overall cognitive status: Within functional limits for tasks assessed     SENSATION: Mid shin down very limited sensation light touch can feel some deeper touch  EDEMA:  Very mild at the ankles  MUSCLE LENGTH: Mild tightness in the HS  POSTURE: rounded shoulders and forward head  PALPATION: Tight and tender in the lumbar paraspinals  LOWER EXTREMITY ROM:  Active ROM Right eval Left eval  Hip flexion 80 80  Hip extension    Hip abduction    Hip adduction    Hip internal rotation    Hip external rotation    Knee flexion    Knee extension 0 0  Ankle dorsiflexion 0 0  Ankle plantarflexion    Ankle inversion    Ankle eversion     (Blank rows = not tested)  LOWER EXTREMITY MMT:  MMT Right eval Left eval  Hip flexion 4 4-  Hip extension 4- 4-  Hip abduction 4 4-  Hip adduction    Hip internal rotation    Hip external rotation    Knee flexion 4 4-  Knee extension 4 3+  Ankle dorsiflexion 2 0  Ankle plantarflexion 1 1  Ankle inversion 1 0  Ankle eversion 0 0   (Blank rows = not tested) FUNCTIONAL TESTS:  TUG: 40 seconds with FWW at evaluation walk test with CGA and FWW = 200 feet at evaluation Unable to stand without holding onto something  GAIT: Distance walked: 200 feet Assistive device utilized: Walker - 2 wheeled Level of assistance: SBA to start and then needs CGA Comments: foot drop bilaterally, decreased foot control, some hip drop on the left side, when fatigued poor safety with transfers   TODAY'S TREATMENT:                                                                                                                              DATE:  02/05/23 NuStep L5 x29mins  Walk outdoors  STS without LE hitting table 2x5   Standing in RW no support 4s at best  Bicep curl 3# standing in RW 2x10 OHP 1# with one arm standing in RW   02/03/23  NuStep L5 x10 mins  Leg ext 20# 3x10 HS curls 25# 3x10  Leg press 20# 3x10  Lat pull down 20# 3x10 Chest press 20# 3x10  blackTB flexion 2x20 Rows and ext with band legs against table 2x10 Step ups x10    01/29/23 NuStep L5 x42mins  Walking outdoors Seated shoulder flexion #1 2x10 Seated shoulder abd #1 2x10 Fitter pushes 2 blue bands 2x10  Volleyball    01/27/23 TUG 17s  Walking 36ft in less than 3 mins  Stairs  Shoulder MMT and ROM recheck Leg ext 20# 3x10 HS curls 25# 3x10  Lat pull downs 25# 2x10 Chest press 20# 2x10 Seated rows with green band 2x10   01/22/23 NuStep L5 x43mins  Walking outdoors halfway around [place] and up/down curbs  Seated marches green band 2x10 Seated hip abd green band 2x10  Standing on reaching for sticky notes, then on airex  Standing in II bars no UE hold 6s at best 3# hip abd 2x10 3# hip ext x10- unable to do another set d/t fatigue  01/20/23 NuStep L5 x75mins  Standing in RW no UE use, 8s, 9s, 6s  - Then on airex 3s x5  Supine bridges x10, unable to do another set due to pain in low back  SLR 2x10  Feet on ball rotations and knees to chest x10  Double knees to chest 30s Single knees to chest 30s  Pball flexion roll outs x10 Step ups 6" on stairs x3 each side   01/15/23 Nustep level 5 x 7 minutes Gait outside 1/2 way around the parking [place] in the back, sit and rest and then around to the front of the building, 3 curbs, all with CGA except one time going down needed min A due to LOB backward 2 # hip extension and abduction Ball in lap isometric abs, cues needed to go slow and get good contraction Side steppping Seated volley ball Standing trying to find her balance very difficult for her due to neuropathy Sit to stand trying to have her not use the back of her legs with PT in front and her hands on my  shoulders x 10  01/13/23 NuStep L5 x15mins  UBE L3 x43mins each way  Calf stretch 30s x2 Standing marches 3# ankle weights  Hip abd 3# 2x10  Leg ext 20# 3x10  HS curls 25# 3x10 Volleyball hit seated   01/07/23 NuStep L5 x77mins  Gait outdoors half the parking [place], sit and rest and then back in, CGA for the curbs Marching on airex  Standing on airex box taps 6"  STS on airex x10 Side steps on airex holding mat table  Fitter pushes 2 blue bands 2x10   01/06/23 NuStep L5 x47mins  Gait outdoors half the parking [place], sit and rest and then back in, CGA for the curbs Standing against mat table rows and ext red 2x10 Standing with RW heel taps 6"  Steps 4" OHP red ball Leg ext 20# 2x10  HS curls 25# 2x10  01/01/23 Nustep level 5 x 5 minutes Gait outside with SBA x 250 feet and then 300 feet, CGA/Min A for curbs due to the difficulty she has with placing the walker and then with stepping up the curb 25# triceps with Mod A for balance 5# biceps with Mod A for balance Lats 20# Chest press 10# 2x10 25# leg curls 3x10 15# leg extension 3x10 Seated volleyball  12/30/22 Gait outside half the parking [place], sit and rest and then back  in, CGA for the curbs Nustep level 5 x 7 minutes Leg press 40# both legs 2x10, then 20# single legs decreased ROM the left leg is very weak Leg curls 25# 2x10, single legs 15# ont he right and 10# on the left Leg extension 10# 2x10, right leg 10# x10, left leg 5# 2x6 2# hip abduction 2x10 2# marching  12/25/22 NuStep L5 x82mins  Walk around back parking [place]  Chest press 20# 2x10 Lat pulls downs 25# 2x10 Seated rows with band green 2x10  2# WaTe shoulder flexion 2x10 legs against mat table  STS on airex with RW in front 2x5   12/23/22 Walking around the back parking [place] 1 rest break Side stepping Worked on active DF bilateral ankles 6" toe touches with walker alternating Standing with walker hip abduction and extension STM to the lumbar ,  thoracic Seated volleyball  12/18/22 Walking around [place], 1 rest break in between  NuStep L5 x22mins  Shoulder assessment - L shoulder flexion 85d - L shoulder abd 60d - L ER 40d  - MMT flexion 2+, abd 2-  5# hip flexion tapping 6" step in RW   Shoulder flexion and abd 1# 2x10   12/16/22 Nustep level 5 x 5 minutes Gait outside total of about 700-800 feet with 2 rests CGA, some min A down curbs, Mod A at times coming up curb Volley ball in sitting Leg curls 25# Lats 25#  12/11/22 Gait from front door around back to door 2 moments of instability and increased left foot drop noted Nustep L 5 6 min Standing on airex facing mat then lifting hands off for 5 sec CG-Min A 10 x Side stepping at mat including stepping on and over airex 5 x each way- trouble with stepping on shoe Standing at elevated mat 1 hand on mat 1 HHA marching fwd and walking back 8 x STS from chair to elevated mat with UE use min A with cuing to push thru LE 2 sets 5  PATIENT EDUCATION:  Education details: POC Person educated: Patient Education method: Explanation Education comprehension: verbalized understanding  HOME EXERCISE PROGRAM: TBD  ASSESSMENT:  CLINICAL IMPRESSION: Patient is doing well, reports some fatigue today but feeling good. Continued to benefit from strength and balance to increase her independence. Fatigues a little bit quicker today and reports it feels like she may be getting a migraine.    OBJECTIVE IMPAIRMENTS: Abnormal gait, cardiopulmonary status limiting activity, decreased activity tolerance, decreased balance, decreased coordination, decreased endurance, decreased mobility, difficulty walking, decreased ROM, decreased strength, impaired flexibility, impaired sensation, and pain.   REHAB POTENTIAL: Good  CLINICAL DECISION MAKING: Stable/uncomplicated  EVALUATION COMPLEXITY: Low   GOALS: Goals reviewed with patient? Yes  SHORT TERM GOALS: Target date: 12/02/22 Independent  with initial HEP Goal status: met 12/04/22  LONG TERM GOALS: Target date: 02/17/23  Independent with advanced HEP or gym program Goal status ongoing 01/15/23  2.  Decrease TUG time to 24 seconds Goal status:ongoing 12/16/22, 17s MET 01/27/23  3.  Walk 300 feet in 3 minute walk test Goal status: ongoing 12/23/22, MET 01/27/23  4.  Be able to negotiate stairs and curbs with SBA with one at a time and a handrail Goal status: progressing 01/01/23, 01/27/23 progressing able to do on 4" but not 6"   5.  Report able to warm up a meal without sitting Goal status: 12/11/22 progressing, MET 01/22/23  6. Patient will demonstrate increase in shoulder ROM to 120d for flexion and abduction  Baseline: 85d flexion, 60d abd  Goal status: 115d flexion, 90d abd progressing  01/27/23  7. Patient will improve L shoulder strength with MMT 4/5   Baseline: 2+ for flexion and 2- for abduction  Goal status: progressing 01/27/23  8. Patient will be able to stand independently without UE support for 10s  Baseline:6s at best  Goal status: INITIAL    PLAN:  PT FREQUENCY: 1-2x/week  PT DURATION: 12 weeks  PLANNED INTERVENTIONS: Therapeutic exercises, Therapeutic activity, Neuromuscular re-education, Balance training, Gait training, Patient/Family education, Self Care, Joint mobilization, and Manual therapy  PLAN FOR NEXT SESSION: continue to add exercises for strength function and balance.    Cassie Freer, PT 02/05/2023, 5:56 PM [place] [facility] [address] [facility]. [place], [place], [address] Phone: [phone]   Fax:  [phone]

## 2023-02-05 ENCOUNTER — Ambulatory Visit: Payer: Medicare Other | Attending: Family Medicine

## 2023-02-05 DIAGNOSIS — M6281 Muscle weakness (generalized): Secondary | ICD-10-CM | POA: Insufficient documentation

## 2023-02-05 DIAGNOSIS — R2681 Unsteadiness on feet: Secondary | ICD-10-CM | POA: Insufficient documentation

## 2023-02-05 DIAGNOSIS — R262 Difficulty in walking, not elsewhere classified: Secondary | ICD-10-CM | POA: Insufficient documentation

## 2023-02-10 ENCOUNTER — Ambulatory Visit: Payer: Medicare Other

## 2023-02-10 DIAGNOSIS — R2681 Unsteadiness on feet: Secondary | ICD-10-CM

## 2023-02-10 DIAGNOSIS — M6281 Muscle weakness (generalized): Secondary | ICD-10-CM | POA: Diagnosis not present

## 2023-02-10 DIAGNOSIS — R262 Difficulty in walking, not elsewhere classified: Secondary | ICD-10-CM | POA: Diagnosis not present

## 2023-02-10 NOTE — Therapy (Signed)
OUTPATIENT PHYSICAL THERAPY LOWER EXTREMITY TREATMENT     Patient Name: Pamela Snyder MRN: [id number] DOB:[birth year], 77 y.o., female Today's Date: 02/10/2023  END OF SESSION:  PT End of Session - 02/10/23 1546     Visit Number 24    Date for PT Re-Evaluation 03/05/23    Authorization Type Medicare    PT Start Time 1545    PT Stop Time 1630    PT Time Calculation (min) 45 min    Equipment Utilized During Treatment Gait belt    Activity Tolerance Patient tolerated treatment well    Behavior During Therapy WFL for tasks assessed/performed                      Past Medical History:  Diagnosis Date   Anxiety    Arthritis    "right foot; spine; hands" (11/23/2014)   Charcot foot due to diabetes mellitus (HCC)    Depression    GERD (gastroesophageal reflux disease)    Gout    Hyperlipemia    Hypertension    Migraine    hx   Neuropathy    Numbness    Thyroid goiter 1986   Type II diabetes mellitus (HCC)    Past Surgical History:  Procedure Laterality Date   ABDOMINAL HYSTERECTOMY  1999   w/BSO   ACHILLES TENDON LENGTHENING Right 11/23/2014   ACHILLES TENDON LENGTHENING Right 11/23/2014   Procedure: RIGHT ACHILLES PERCUTANEOUS TENDON LENGTHENING;  Surgeon: Toni Arthurs, MD;  Location: [facility];  Service: Orthopedics;  Laterality: Right;   APPENDECTOMY  1963   ARTHRODESIS Right 11/23/2014   mid foot   CARDIAC CATHETERIZATION  08/2004   FOOT ARTHRODESIS Right 11/23/2014   Procedure: RIGHT MID FOOT ARTHRODESIS;  Surgeon: Toni Arthurs, MD;  Location: [facility];  Service: Orthopedics;  Laterality: Right;   METATARSAL OSTEOTOMY Right 11/23/2014   Procedure: RIGHT MID FOOT OSTEOTOMY;  Surgeon: Toni Arthurs, MD;  Location: [facility];  Service: Orthopedics;  Laterality: Right;   ORIF ANKLE FRACTURE Left 05/16/2021   Procedure: OPEN REDUCTION INTERNAL FIXATION (ORIF) Left ankle lateral malleolus, possible deltoid ligament repair;  Surgeon: Toni Arthurs, MD;  Location: [facility];   Service: Orthopedics;  Laterality: Left;   OSTEOTOMY Right 11/23/2014   mid foot   PARTIAL THYMECTOMY  1986   ? side   TONSILLECTOMY  1954   Patient Active Problem List   Diagnosis Date Noted   Closed low lateral malleolus fracture 05/16/2021   Gout 05/16/2021   Hyperlipemia 05/16/2021   Hypertension 05/16/2021   Type II diabetes mellitus (HCC) 05/16/2021   Spinal stenosis of lumbar region 03/10/2019   Cervical myelopathy (HCC) 03/10/2019   Peripheral neuropathy 02/02/2019   Gait abnormality 02/02/2019   Urinary urgency 02/02/2019   Chronic bilateral low back pain without sciatica 02/02/2019   DOE (dyspnea on exertion) 10/05/2017   Bruit of right carotid artery 10/05/2017   Coronary artery calcification seen on CT scan 10/05/2017   Chest pain 10/04/2017   Aortic atherosclerosis (HCC) 10/04/2017   Charcot foot due to diabetes mellitus (HCC) 11/23/2014    PCP: Jarrett Soho, PA  REFERRING PROVIDER: Delena Serve, PA  REFERRING DIAG: poor balance, weakness, difficulty walking  THERAPY DIAG:  Muscle weakness (generalized)  Unsteady gait  Difficulty in walking, not elsewhere classified  Rationale for Evaluation and Treatment: Rehabilitation  ONSET DATE: 11/03/22  SUBJECTIVE:    SUBJECTIVE STATEMENT: I am tired and stiff as a board.    PERTINENT HISTORY: See above PAIN:  Are you having pain? Yes: NPRS scale: 0/10 Pain location: low back Pain description: tight, stabbing Aggravating factors: being on feet longer, twisting pain pain up to 6/10 Relieving factors: rest pain will go to 0/10  PRECAUTIONS: Fall  WEIGHT BEARING RESTRICTIONS: No  FALLS:  Has patient fallen in last 6 months? No, just unsteady  LIVING ENVIRONMENT: Lives with: alone Lives in: House/apartment Stairs: Yes: Internal: 12 steps; can reach both Has following equipment at home: Walker - 2 wheeled  OCCUPATION: retired  PLOF: uses walker for household mobility, does her cooking and cleaning  using a FWW  PATIENT GOALS: may go to the beach in the fall, attend women's group, feel stronger, more steady, stay living at home independently  NEXT MD VISIT: none scheduled  OBJECTIVE:   DIAGNOSTIC FINDINGS: none  COGNITION: Overall cognitive status: Within functional limits for tasks assessed     SENSATION: Mid shin down very limited sensation light touch can feel some deeper touch  EDEMA:  Very mild at the ankles  MUSCLE LENGTH: Mild tightness in the HS  POSTURE: rounded shoulders and forward head  PALPATION: Tight and tender in the lumbar paraspinals  LOWER EXTREMITY ROM:  Active ROM Right eval Left eval  Hip flexion 80 80  Hip extension    Hip abduction    Hip adduction    Hip internal rotation    Hip external rotation    Knee flexion    Knee extension 0 0  Ankle dorsiflexion 0 0  Ankle plantarflexion    Ankle inversion    Ankle eversion     (Blank rows = not tested)  LOWER EXTREMITY MMT:  MMT Right eval Left eval  Hip flexion 4 4-  Hip extension 4- 4-  Hip abduction 4 4-  Hip adduction    Hip internal rotation    Hip external rotation    Knee flexion 4 4-  Knee extension 4 3+  Ankle dorsiflexion 2 0  Ankle plantarflexion 1 1  Ankle inversion 1 0  Ankle eversion 0 0   (Blank rows = not tested) FUNCTIONAL TESTS:  TUG: 40 seconds with FWW at evaluation walk test with CGA and FWW = 200 feet at evaluation Unable to stand without holding onto something  GAIT: Distance walked: 200 feet Assistive device utilized: Walker - 2 wheeled Level of assistance: SBA to start and then needs CGA Comments: foot drop bilaterally, decreased foot control, some hip drop on the left side, when fatigued poor safety with transfers   TODAY'S TREATMENT:                                                                                                                              DATE:  02/10/23 NuStep L5 x8mins  Seated trunk rotations reaches sitting on  mat table x3 each way  Standing in walker no UE support 5s  Side steps along mat table with UE support of PT  Leg ext  20# 2x10 HS curls 25# 2x10  Lat pull downs 20#  STM with massage gun to back    02/05/23 NuStep L5 x87mins  Walk outdoors  STS without LE hitting table 2x5  Standing in RW no support 4s at best  Bicep curl 3# standing in RW 2x10 OHP 1# with one arm standing in RW   02/03/23 NuStep L5 x10 mins  Leg ext 20# 3x10 HS curls 25# 3x10  Leg press 20# 3x10  Lat pull down 20# 3x10 Chest press 20# 3x10  blackTB flexion 2x20 Rows and ext with band legs against table 2x10 Step ups x10    01/29/23 NuStep L5 x59mins  Walking outdoors Seated shoulder flexion #1 2x10 Seated shoulder abd #1 2x10 Fitter pushes 2 blue bands 2x10  Volleyball    01/27/23 TUG 17s  Walking 346ft in less than 3 mins  Stairs  Shoulder MMT and ROM recheck Leg ext 20# 3x10 HS curls 25# 3x10  Lat pull downs 25# 2x10 Chest press 20# 2x10 Seated rows with green band 2x10   01/22/23 NuStep L5 x91mins  Walking outdoors halfway around [place] and up/down curbs  Seated marches green band 2x10 Seated hip abd green band 2x10  Standing on reaching for sticky notes, then on airex  Standing in II bars no UE hold 6s at best 3# hip abd 2x10 3# hip ext x10- unable to do another set d/t fatigue  01/20/23 NuStep L5 x54mins  Standing in RW no UE use, 8s, 9s, 6s  - Then on airex 3s x5  Supine bridges x10, unable to do another set due to pain in low back  SLR 2x10  Feet on ball rotations and knees to chest x10  Double knees to chest 30s Single knees to chest 30s  Pball flexion roll outs x10 Step ups 6" on stairs x3 each side   01/15/23 Nustep level 5 x 7 minutes Gait outside 1/2 way around the parking [place] in the back, sit and rest and then around to the front of the building, 3 curbs, all with CGA except one time going down needed min A due to LOB backward 2 # hip extension and abduction Ball in  lap isometric abs, cues needed to go slow and get good contraction Side steppping Seated volley ball Standing trying to find her balance very difficult for her due to neuropathy Sit to stand trying to have her not use the back of her legs with PT in front and her hands on my shoulders x 10  01/13/23 NuStep L5 x66mins  UBE L3 x82mins each way  Calf stretch 30s x2 Standing marches 3# ankle weights  Hip abd 3# 2x10  Leg ext 20# 3x10  HS curls 25# 3x10 Volleyball hit seated   01/07/23 NuStep L5 x3mins  Gait outdoors half the parking [place], sit and rest and then back in, CGA for the curbs Marching on airex  Standing on airex box taps 6"  STS on airex x10 Side steps on airex holding mat table  Fitter pushes 2 blue bands 2x10   01/06/23 NuStep L5 x5mins  Gait outdoors half the parking [place], sit and rest and then back in, CGA for the curbs Standing against mat table rows and ext red 2x10 Standing with RW heel taps 6"  Steps 4" OHP red ball Leg ext 20# 2x10  HS curls 25# 2x10  01/01/23 Nustep level 5 x 5 minutes Gait outside with SBA x 250 feet and then 300 feet, CGA/Min A  for curbs due to the difficulty she has with placing the walker and then with stepping up the curb 25# triceps with Mod A for balance 5# biceps with Mod A for balance Lats 20# Chest press 10# 2x10 25# leg curls 3x10 15# leg extension 3x10 Seated volleyball  12/30/22 Gait outside half the parking [place], sit and rest and then back in, CGA for the curbs Nustep level 5 x 7 minutes Leg press 40# both legs 2x10, then 20# single legs decreased ROM the left leg is very weak Leg curls 25# 2x10, single legs 15# ont he right and 10# on the left Leg extension 10# 2x10, right leg 10# x10, left leg 5# 2x6 2# hip abduction 2x10 2# marching  12/25/22 NuStep L5 x64mins  Walk around back parking [place]  Chest press 20# 2x10 Lat pulls downs 25# 2x10 Seated rows with band green 2x10  2# Wa[place] shoulder flexion 2x10 legs  against mat table  STS on airex with RW in front 2x5   12/23/22 Walking around the back parking [place] 1 rest break Side stepping Worked on active DF bilateral ankles 6" toe touches with walker alternating Standing with walker hip abduction and extension STM to the lumbar , thoracic Seated volleyball  12/18/22 Walking around [place], 1 rest break in between  NuStep L5 x76mins  Shoulder assessment - L shoulder flexion 85d - L shoulder abd 60d - L ER 40d  - MMT flexion 2+, abd 2-  5# hip flexion tapping 6" step in RW   Shoulder flexion and abd 1# 2x10   12/16/22 Nustep level 5 x 5 minutes Gait outside total of about 700-800 feet with 2 rests CGA, some min A down curbs, Mod A at times coming up curb Volley ball in sitting Leg curls 25# Lats 25#  12/11/22 Gait from front door around back to door 2 moments of instability and increased left foot drop noted Nustep L 5 6 min Standing on airex facing mat then lifting hands off for 5 sec CG-Min A 10 x Side stepping at mat including stepping on and over airex 5 x each way- trouble with stepping on shoe Standing at elevated mat 1 hand on mat 1 HHA marching fwd and walking back 8 x STS from chair to elevated mat with UE use min A with cuing to push thru LE 2 sets 5  PATIENT EDUCATION:  Education details: POC Person educated: Patient Education method: Explanation Education comprehension: verbalized understanding  HOME EXERCISE PROGRAM: TBD  ASSESSMENT:  CLINICAL IMPRESSION: Patient reports increased stiffness in back today. She is moving slower today than usual. Continued to benefit from strength and balance to increase her independence.   OBJECTIVE IMPAIRMENTS: Abnormal gait, cardiopulmonary status limiting activity, decreased activity tolerance, decreased balance, decreased coordination, decreased endurance, decreased mobility, difficulty walking, decreased ROM, decreased strength, impaired flexibility, impaired sensation, and  pain.   REHAB POTENTIAL: Good  CLINICAL DECISION MAKING: Stable/uncomplicated  EVALUATION COMPLEXITY: Low   GOALS: Goals reviewed with patient? Yes  SHORT TERM GOALS: Target date: 12/02/22 Independent with initial HEP Goal status: met 12/04/22  LONG TERM GOALS: Target date: 02/17/23  Independent with advanced HEP or gym program Goal status ongoing 01/15/23  2.  Decrease TUG time to 24 seconds Goal status:ongoing 12/16/22, 17s MET 01/27/23  3.  Walk 300 feet in 3 minute walk test Goal status: ongoing 12/23/22, MET 01/27/23  4.  Be able to negotiate stairs and curbs with SBA with one at a time and a handrail  Goal status: progressing 01/01/23, 01/27/23 progressing able to do on 4" but not 6"   5.  Report able to warm up a meal without sitting Goal status: 12/11/22 progressing, MET 01/22/23  6. Patient will demonstrate increase in shoulder ROM to 120d for flexion and abduction  Baseline: 85d flexion, 60d abd  Goal status: 115d flexion, 90d abd progressing  01/27/23  7. Patient will improve L shoulder strength with MMT 4/5   Baseline: 2+ for flexion and 2- for abduction  Goal status: progressing 01/27/23  8. Patient will be able to stand independently without UE support for 10s  Baseline:6s at best  Goal status: INITIAL    PLAN:  PT FREQUENCY: 1-2x/week  PT DURATION: 12 weeks  PLANNED INTERVENTIONS: Therapeutic exercises, Therapeutic activity, Neuromuscular re-education, Balance training, Gait training, Patient/Family education, Self Care, Joint mobilization, and Manual therapy  PLAN FOR NEXT SESSION: continue to add exercises for strength function and balance.    [address]  [place], PT 02/10/2023, 4:42 PM [facility] at [facility] [address] [place], [place], [address] Phone: [phone]   Fax:  [phone]

## 2023-02-12 ENCOUNTER — Ambulatory Visit: Payer: Medicare Other | Admitting: Physical Therapy

## 2023-02-17 ENCOUNTER — Encounter: Payer: Self-pay | Admitting: Physical Therapy

## 2023-02-17 ENCOUNTER — Ambulatory Visit: Payer: Medicare Other | Admitting: Physical Therapy

## 2023-02-17 DIAGNOSIS — M6281 Muscle weakness (generalized): Secondary | ICD-10-CM | POA: Diagnosis not present

## 2023-02-17 DIAGNOSIS — R2681 Unsteadiness on feet: Secondary | ICD-10-CM

## 2023-02-17 DIAGNOSIS — R262 Difficulty in walking, not elsewhere classified: Secondary | ICD-10-CM | POA: Diagnosis not present

## 2023-02-17 NOTE — Therapy (Signed)
OUTPATIENT PHYSICAL THERAPY LOWER EXTREMITY TREATMENT     Patient Name: Pamela Snyder MRN: [id number] DOB:02-09-1946, 77 y.o., female Today's Date: 02/17/2023  END OF SESSION:  PT End of Session - 02/17/23 1628     Visit Number 25    Date for PT Re-Evaluation 03/05/23    Authorization Type Medicare    PT Start Time 1608    PT Stop Time 1654    PT Time Calculation (min) 46 min    Equipment Utilized During Treatment Gait belt    Activity Tolerance Patient tolerated treatment well    Behavior During Therapy WFL for tasks assessed/performed                      Past Medical History:  Diagnosis Date   Anxiety    Arthritis    "right foot; spine; hands" (11/23/2014)   Charcot foot due to diabetes mellitus (HCC)    Depression    GERD (gastroesophageal reflux disease)    Gout    Hyperlipemia    Hypertension    Migraine    hx   Neuropathy    Numbness    Thyroid goiter 1986   Type II diabetes mellitus (HCC)    Past Surgical History:  Procedure Laterality Date   ABDOMINAL HYSTERECTOMY  1999   w/BSO   ACHILLES TENDON LENGTHENING Right 11/23/2014   ACHILLES TENDON LENGTHENING Right 11/23/2014   Procedure: RIGHT ACHILLES PERCUTANEOUS TENDON LENGTHENING;  Surgeon: Toni Arthurs, MD;  Location: [facility];  Service: Orthopedics;  Laterality: Right;   APPENDECTOMY  1963   ARTHRODESIS Right 11/23/2014   mid foot   CARDIAC CATHETERIZATION  08/2004   FOOT ARTHRODESIS Right 11/23/2014   Procedure: RIGHT MID FOOT ARTHRODESIS;  Surgeon: Toni Arthurs, MD;  Location: [facility];  Service: Orthopedics;  Laterality: Right;   METATARSAL OSTEOTOMY Right 11/23/2014   Procedure: RIGHT MID FOOT OSTEOTOMY;  Surgeon: Toni Arthurs, MD;  Location: [facility];  Service: Orthopedics;  Laterality: Right;   ORIF ANKLE FRACTURE Left 05/16/2021   Procedure: OPEN REDUCTION INTERNAL FIXATION (ORIF) Left ankle lateral malleolus, possible deltoid ligament repair;  Surgeon: Toni Arthurs, MD;  Location: [facility];   Service: Orthopedics;  Laterality: Left;   OSTEOTOMY Right 11/23/2014   mid foot   PARTIAL THYMECTOMY  1986   ? side   TONSILLECTOMY  1954   Patient Active Problem List   Diagnosis Date Noted   Closed low lateral malleolus fracture 05/16/2021   Gout 05/16/2021   Hyperlipemia 05/16/2021   Hypertension 05/16/2021   Type II diabetes mellitus (HCC) 05/16/2021   Spinal stenosis of lumbar region 03/10/2019   Cervical myelopathy (HCC) 03/10/2019   Peripheral neuropathy 02/02/2019   Gait abnormality 02/02/2019   Urinary urgency 02/02/2019   Chronic bilateral low back pain without sciatica 02/02/2019   DOE (dyspnea on exertion) 10/05/2017   Bruit of right carotid artery 10/05/2017   Coronary artery calcification seen on CT scan 10/05/2017   Chest pain 10/04/2017   Aortic atherosclerosis (HCC) 10/04/2017   Charcot foot due to diabetes mellitus (HCC) 11/23/2014    PCP: Jarrett Soho, PA  REFERRING PROVIDER: Delena Serve, PA  REFERRING DIAG: poor balance, weakness, difficulty walking  THERAPY DIAG:  Muscle weakness (generalized)  Unsteady gait  Difficulty in walking, not elsewhere classified  Rationale for Evaluation and Treatment: Rehabilitation  ONSET DATE: 11/03/22  SUBJECTIVE:    SUBJECTIVE STATEMENT:  No falls, doing okay , reports catching her toes more often when walking  PERTINENT HISTORY: See above PAIN:  Are you having pain? Yes: NPRS scale: 0/10 Pain location: low back Pain description: tight, stabbing Aggravating factors: being on feet longer, twisting pain pain up to 6/10 Relieving factors: rest pain will go to 0/10  PRECAUTIONS: Fall  WEIGHT BEARING RESTRICTIONS: No  FALLS:  Has patient fallen in last 6 months? No, just unsteady  LIVING ENVIRONMENT: Lives with: alone Lives in: House/apartment Stairs: Yes: Internal: 12 steps; can reach both Has following equipment at home: Walker - 2 wheeled  OCCUPATION: retired  PLOF: uses walker for household  mobility, does her cooking and cleaning using a FWW  PATIENT GOALS: may go to the beach in the fall, attend women's group, feel stronger, more steady, stay living at home independently  NEXT MD VISIT: none scheduled  OBJECTIVE:   DIAGNOSTIC FINDINGS: none  COGNITION: Overall cognitive status: Within functional limits for tasks assessed     SENSATION: Mid shin down very limited sensation light touch can feel some deeper touch  EDEMA:  Very mild at the ankles  MUSCLE LENGTH: Mild tightness in the HS  POSTURE: rounded shoulders and forward head  PALPATION: Tight and tender in the lumbar paraspinals  LOWER EXTREMITY ROM:  Active ROM Right eval Left eval  Hip flexion 80 80  Hip extension    Hip abduction    Hip adduction    Hip internal rotation    Hip external rotation    Knee flexion    Knee extension 0 0  Ankle dorsiflexion 0 0  Ankle plantarflexion    Ankle inversion    Ankle eversion     (Blank rows = not tested)  LOWER EXTREMITY MMT:  MMT Right eval Left eval  Hip flexion 4 4-  Hip extension 4- 4-  Hip abduction 4 4-  Hip adduction    Hip internal rotation    Hip external rotation    Knee flexion 4 4-  Knee extension 4 3+  Ankle dorsiflexion 2 0  Ankle plantarflexion 1 1  Ankle inversion 1 0  Ankle eversion 0 0   (Blank rows = not tested) FUNCTIONAL TESTS:  TUG: 40 seconds with FWW at evaluation walk test with CGA and FWW = 200 feet at evaluation Unable to stand without holding onto something  GAIT: Distance walked: 200 feet Assistive device utilized: Walker - 2 wheeled Level of assistance: SBA to start and then needs CGA Comments: foot drop bilaterally, decreased foot control, some hip drop on the left side, when fatigued poor safety with transfers   TODAY'S TREATMENT:                                                                                                                              DATE:  02/17/23 Gait outside CGA for  curbs 2# bar reaching overhead 5# biceps curls 5# chest press 20# HS curls 3x10 20# Knee extension 3 x10 Standing using two QC's 4" alternating toe  touches Side stepping with HHA Backward walking with HHA Nustep level 5 x 6 minutes  02/10/23 NuStep L5 x1mins  Seated trunk rotations reaches sitting on mat table x3 each way  Standing in walker no UE support 5s  Side steps along mat table with UE support of PT  Leg ext 20# 2x10 HS curls 25# 2x10  Lat pull downs 20#  STM with massage gun to back    02/05/23 NuStep L5 x59mins  Walk outdoors  STS without LE hitting table 2x5  Standing in RW no support 4s at best  Bicep curl 3# standing in RW 2x10 OHP 1# with one arm standing in RW   02/03/23 NuStep L5 x10 mins  Leg ext 20# 3x10 HS curls 25# 3x10  Leg press 20# 3x10  Lat pull down 20# 3x10 Chest press 20# 3x10  blackTB flexion 2x20 Rows and ext with band legs against table 2x10 Step ups x10    01/29/23 NuStep L5 x31mins  Walking outdoors Seated shoulder flexion #1 2x10 Seated shoulder abd #1 2x10 Fitter pushes 2 blue bands 2x10  Volleyball    01/27/23 TUG 17s  Walking 340ft in less than 3 mins  Stairs  Shoulder MMT and ROM recheck Leg ext 20# 3x10 HS curls 25# 3x10  Lat pull downs 25# 2x10 Chest press 20# 2x10 Seated rows with green band 2x10   01/22/23 NuStep L5 x34mins  Walking outdoors halfway around [place] and up/down curbs  Seated marches green band 2x10 Seated hip abd green band 2x10  Standing on reaching for sticky notes, then on airex  Standing in II bars no UE hold 6s at best 3# hip abd 2x10 3# hip ext x10- unable to do another set d/t fatigue  01/20/23 NuStep L5 x27mins  Standing in RW no UE use, 8s, 9s, 6s  - Then on airex 3s x5  Supine bridges x10, unable to do another set due to pain in low back  SLR 2x10  Feet on ball rotations and knees to chest x10  Double knees to chest 30s Single knees to chest 30s  Pball flexion roll outs x10 Step ups  6" on stairs x3 each side   PATIENT EDUCATION:  Education details: POC Person educated: Patient Education method: Explanation Education comprehension: verbalized understanding  HOME EXERCISE PROGRAM: TBD  ASSESSMENT:  CLINICAL IMPRESSION: Patient has had some continued stiffness of the low back, she also has reported some increased instances of catching her toes while walking.  She does report that the shoulder is feeling a little stronger.  On the curbs when walking outside she continues to need CGA for safety   OBJECTIVE IMPAIRMENTS: Abnormal gait, cardiopulmonary status limiting activity, decreased activity tolerance, decreased balance, decreased coordination, decreased endurance, decreased mobility, difficulty walking, decreased ROM, decreased strength, impaired flexibility, impaired sensation, and pain.   REHAB POTENTIAL: Good  CLINICAL DECISION MAKING: Stable/uncomplicated  EVALUATION COMPLEXITY: Low   GOALS: Goals reviewed with patient? Yes  SHORT TERM GOALS: Target date: 12/02/22 Independent with initial HEP Goal status: met 12/04/22  LONG TERM GOALS: Target date: 02/17/23  Independent with advanced HEP or gym program Goal status ongoing 01/15/23  2.  Decrease TUG time to 24 seconds Goal status:ongoing 12/16/22, 17s MET 01/27/23  3.  Walk 300 feet in 3 minute walk test Goal status: ongoing 12/23/22, MET 01/27/23  4.  Be able to negotiate stairs and curbs with SBA with one at a time and a handrail Goal status: progressing 01/01/23, 01/27/23 progressing able to  do on 4" but not 6"   5.  Report able to warm up a meal without sitting Goal status: 12/11/22 progressing, MET 01/22/23  6. Patient will demonstrate increase in shoulder ROM to 120d for flexion and abduction  Baseline: 85d flexion, 60d abd  Goal status: 115d flexion, 90d abd progressing  01/27/23  7. Patient will improve L shoulder strength with MMT 4/5   Baseline: 2+ for flexion and 2- for abduction  Goal  status: progressing 01/27/23  8. Patient will be able to stand independently without UE support for 10s  Baseline:6s at best  Goal status: ongoing 02/17/23   PLAN:  PT FREQUENCY: 1-2x/week  PT DURATION: 12 weeks  PLANNED INTERVENTIONS: Therapeutic exercises, Therapeutic activity, Neuromuscular re-education, Balance training, Gait training, Patient/Family education, Self Care, Joint mobilization, and Manual therapy  PLAN FOR NEXT SESSION: continue to add exercises for strength function and balance.    Jearld Lesch, PT 02/17/2023, 4:30 PM [facility] at [facility] [address] [place], [place], [address] Phone: [phone]   Fax:  [phone]

## 2023-02-19 ENCOUNTER — Ambulatory Visit: Payer: Medicare Other

## 2023-02-19 DIAGNOSIS — M6281 Muscle weakness (generalized): Secondary | ICD-10-CM | POA: Diagnosis not present

## 2023-02-19 DIAGNOSIS — R262 Difficulty in walking, not elsewhere classified: Secondary | ICD-10-CM | POA: Diagnosis not present

## 2023-02-19 DIAGNOSIS — R2681 Unsteadiness on feet: Secondary | ICD-10-CM

## 2023-02-19 NOTE — Therapy (Signed)
OUTPATIENT PHYSICAL THERAPY LOWER EXTREMITY TREATMENT     Patient Name: Pamela Snyder MRN: [id number] DOB:[birth year], 77 y.o., female Today's Date: 02/19/2023  END OF SESSION:  PT End of Session - 02/19/23 1707     Visit Number 26    Date for PT Re-Evaluation 03/05/23    Authorization Type Medicare    PT Start Time 1708    PT Stop Time 1755    PT Time Calculation (min) 47 min    Equipment Utilized During Treatment Gait belt    Activity Tolerance Patient tolerated treatment well    Behavior During Therapy WFL for tasks assessed/performed                       Past Medical History:  Diagnosis Date   Anxiety    Arthritis    "right foot; spine; hands" (11/23/2014)   Charcot foot due to diabetes mellitus (HCC)    Depression    GERD (gastroesophageal reflux disease)    Gout    Hyperlipemia    Hypertension    Migraine    hx   Neuropathy    Numbness    Thyroid goiter 1986   Type II diabetes mellitus (HCC)    Past Surgical History:  Procedure Laterality Date   ABDOMINAL HYSTERECTOMY  1999   w/BSO   ACHILLES TENDON LENGTHENING Right 11/23/2014   ACHILLES TENDON LENGTHENING Right 11/23/2014   Procedure: RIGHT ACHILLES PERCUTANEOUS TENDON LENGTHENING;  Surgeon: Toni Arthurs, MD;  Location: [facility];  Service: Orthopedics;  Laterality: Right;   APPENDECTOMY  1963   ARTHRODESIS Right 11/23/2014   mid foot   CARDIAC CATHETERIZATION  08/2004   FOOT ARTHRODESIS Right 11/23/2014   Procedure: RIGHT MID FOOT ARTHRODESIS;  Surgeon: Toni Arthurs, MD;  Location: [facility];  Service: Orthopedics;  Laterality: Right;   METATARSAL OSTEOTOMY Right 11/23/2014   Procedure: RIGHT MID FOOT OSTEOTOMY;  Surgeon: Toni Arthurs, MD;  Location: [facility];  Service: Orthopedics;  Laterality: Right;   ORIF ANKLE FRACTURE Left 05/16/2021   Procedure: OPEN REDUCTION INTERNAL FIXATION (ORIF) Left ankle lateral malleolus, possible deltoid ligament repair;  Surgeon: Toni Arthurs, MD;  Location: [facility];   Service: Orthopedics;  Laterality: Left;   OSTEOTOMY Right 11/23/2014   mid foot   PARTIAL THYMECTOMY  1986   ? side   TONSILLECTOMY  1954   Patient Active Problem List   Diagnosis Date Noted   Closed low lateral malleolus fracture 05/16/2021   Gout 05/16/2021   Hyperlipemia 05/16/2021   Hypertension 05/16/2021   Type II diabetes mellitus (HCC) 05/16/2021   Spinal stenosis of lumbar region 03/10/2019   Cervical myelopathy (HCC) 03/10/2019   Peripheral neuropathy 02/02/2019   Gait abnormality 02/02/2019   Urinary urgency 02/02/2019   Chronic bilateral low back pain without sciatica 02/02/2019   DOE (dyspnea on exertion) 10/05/2017   Bruit of right carotid artery 10/05/2017   Coronary artery calcification seen on CT scan 10/05/2017   Chest pain 10/04/2017   Aortic atherosclerosis (HCC) 10/04/2017   Charcot foot due to diabetes mellitus (HCC) 11/23/2014    PCP: Jarrett Soho, PA  REFERRING PROVIDER: Delena Serve, PA  REFERRING DIAG: poor balance, weakness, difficulty walking  THERAPY DIAG:  Muscle weakness (generalized)  Unsteady gait  Difficulty in walking, not elsewhere classified  Rationale for Evaluation and Treatment: Rehabilitation  ONSET DATE: 11/03/22  SUBJECTIVE:    SUBJECTIVE STATEMENT:  I am fine   PERTINENT HISTORY: See above PAIN:  Are you having  pain? Yes: NPRS scale: 0/10 Pain location: low back Pain description: tight, stabbing Aggravating factors: being on feet longer, twisting pain pain up to 6/10 Relieving factors: rest pain will go to 0/10  PRECAUTIONS: Fall  WEIGHT BEARING RESTRICTIONS: No  FALLS:  Has patient fallen in last 6 months? No, just unsteady  LIVING ENVIRONMENT: Lives with: alone Lives in: House/apartment Stairs: Yes: Internal: 12 steps; can reach both Has following equipment at home: Walker - 2 wheeled  OCCUPATION: retired  PLOF: uses walker for household mobility, does her cooking and cleaning using a FWW  PATIENT  GOALS: may go to the beach in the fall, attend women's group, feel stronger, more steady, stay living at home independently  NEXT MD VISIT: none scheduled  OBJECTIVE:   DIAGNOSTIC FINDINGS: none  COGNITION: Overall cognitive status: Within functional limits for tasks assessed     SENSATION: Mid shin down very limited sensation light touch can feel some deeper touch  EDEMA:  Very mild at the ankles  MUSCLE LENGTH: Mild tightness in the HS  POSTURE: rounded shoulders and forward head  PALPATION: Tight and tender in the lumbar paraspinals  LOWER EXTREMITY ROM:  Active ROM Right eval Left eval  Hip flexion 80 80  Hip extension    Hip abduction    Hip adduction    Hip internal rotation    Hip external rotation    Knee flexion    Knee extension 0 0  Ankle dorsiflexion 0 0  Ankle plantarflexion    Ankle inversion    Ankle eversion     (Blank rows = not tested)  LOWER EXTREMITY MMT:  MMT Right eval Left eval  Hip flexion 4 4-  Hip extension 4- 4-  Hip abduction 4 4-  Hip adduction    Hip internal rotation    Hip external rotation    Knee flexion 4 4-  Knee extension 4 3+  Ankle dorsiflexion 2 0  Ankle plantarflexion 1 1  Ankle inversion 1 0  Ankle eversion 0 0   (Blank rows = not tested) FUNCTIONAL TESTS:  TUG: 40 seconds with FWW at evaluation walk test with CGA and FWW = 200 feet at evaluation Unable to stand without holding onto something  GAIT: Distance walked: 200 feet Assistive device utilized: Walker - 2 wheeled Level of assistance: SBA to start and then needs CGA Comments: foot drop bilaterally, decreased foot control, some hip drop on the left side, when fatigued poor safety with transfers   TODAY'S TREATMENT:                                                                                                                              DATE:  02/19/23 Walking outside NuStep L5x  3# WaTE curl to push overhead 2x10  Side steps  along mat table STS 2x10 20# HS curls 2x10 20# Knee extension 2x10   02/17/23 Gait outside CGA for curbs 2# bar reaching  overhead 5# biceps curls 5# chest press 20# HS curls 3x10 20# Knee extension 3 x10 Standing using two QC's 4" alternating toe touches Side stepping with HHA Backward walking with HHA Nustep level 5 x 6 minutes  02/10/23 NuStep L5 x72mins  Seated trunk rotations reaches sitting on mat table x3 each way  Standing in walker no UE support 5s  Side steps along mat table with UE support of PT  Leg ext 20# 2x10 HS curls 25# 2x10  Lat pull downs 20#  STM with massage gun to back    02/05/23 NuStep L5 x37mins  Walk outdoors  STS without LE hitting table 2x5  Standing in RW no support 4s at best  Bicep curl 3# standing in RW 2x10 OHP 1# with one arm standing in RW   02/03/23 NuStep L5 x10 mins  Leg ext 20# 3x10 HS curls 25# 3x10  Leg press 20# 3x10  Lat pull down 20# 3x10 Chest press 20# 3x10  blackTB flexion 2x20 Rows and ext with band legs against table 2x10 Step ups x10    01/29/23 NuStep L5 x50mins  Walking outdoors Seated shoulder flexion #1 2x10 Seated shoulder abd #1 2x10 Fitter pushes 2 blue bands 2x10  Volleyball    01/27/23 TUG 17s  Walking 373ft in less than 3 mins  Stairs  Shoulder MMT and ROM recheck Leg ext 20# 3x10 HS curls 25# 3x10  Lat pull downs 25# 2x10 Chest press 20# 2x10 Seated rows with green band 2x10   01/22/23 NuStep L5 x66mins  Walking outdoors halfway around [place] and up/down curbs  Seated marches green band 2x10 Seated hip abd green band 2x10  Standing on reaching for sticky notes, then on airex  Standing in II bars no UE hold 6s at best 3# hip abd 2x10 3# hip ext x10- unable to do another set d/t fatigue  01/20/23 NuStep L5 x11mins  Standing in RW no UE use, 8s, 9s, 6s  - Then on airex 3s x5  Supine bridges x10, unable to do another set due to pain in low back  SLR 2x10  Feet on ball rotations and knees to  chest x10  Double knees to chest 30s Single knees to chest 30s  Pball flexion roll outs x10 Step ups 6" on stairs x3 each side   PATIENT EDUCATION:  Education details: POC Person educated: Patient Education method: Explanation Education comprehension: verbalized understanding  HOME EXERCISE PROGRAM: TBD  ASSESSMENT:  CLINICAL IMPRESSION: Walked halfway around [place] and up through the front door. She was very fatigued after this, had one instance of toe catching. On the curbs when walking outside she continues to need CGA for safety. Does better with STS today, able to stand for a few secs before needing to sit or grab the walker.    OBJECTIVE IMPAIRMENTS: Abnormal gait, cardiopulmonary status limiting activity, decreased activity tolerance, decreased balance, decreased coordination, decreased endurance, decreased mobility, difficulty walking, decreased ROM, decreased strength, impaired flexibility, impaired sensation, and pain.   REHAB POTENTIAL: Good  CLINICAL DECISION MAKING: Stable/uncomplicated  EVALUATION COMPLEXITY: Low   GOALS: Goals reviewed with patient? Yes  SHORT TERM GOALS: Target date: 12/02/22 Independent with initial HEP Goal status: met 12/04/22  LONG TERM GOALS: Target date: 02/17/23  Independent with advanced HEP or gym program Goal status ongoing 01/15/23  2.  Decrease TUG time to 24 seconds Goal status:ongoing 12/16/22, 17s MET 01/27/23  3.  Walk 300 feet in 3 minute walk test Goal status: ongoing 12/23/22,  MET 01/27/23  4.  Be able to negotiate stairs and curbs with SBA with one at a time and a handrail Goal status: progressing 01/01/23, 01/27/23 progressing able to do on 4" but not 6"   5.  Report able to warm up a meal without sitting Goal status: 12/11/22 progressing, MET 01/22/23  6. Patient will demonstrate increase in shoulder ROM to 120d for flexion and abduction  Baseline: 85d flexion, 60d abd  Goal status: 115d flexion, 90d abd progressing   01/27/23  7. Patient will improve L shoulder strength with MMT 4/5   Baseline: 2+ for flexion and 2- for abduction  Goal status: progressing 01/27/23  8. Patient will be able to stand independently without UE support for 10s  Baseline:6s at best  Goal status: ongoing 02/17/23   PLAN:  PT FREQUENCY: 1-2x/week  PT DURATION: 12 weeks  PLANNED INTERVENTIONS: Therapeutic exercises, Therapeutic activity, Neuromuscular re-education, Balance training, Gait training, Patient/Family education, Self Care, Joint mobilization, and Manual therapy  PLAN FOR NEXT SESSION: continue to add exercises for strength function and balance.    Cassie Freer, PT 02/19/2023, 5:57 PM [facility] at [facility]. [place], [place], [address] Phone: [phone]   Fax:  [phone]

## 2023-02-24 ENCOUNTER — Ambulatory Visit: Payer: Medicare Other | Admitting: Physical Therapy

## 2023-02-26 ENCOUNTER — Encounter: Payer: Self-pay | Admitting: Physical Therapy

## 2023-02-26 ENCOUNTER — Ambulatory Visit: Payer: Medicare Other | Admitting: Physical Therapy

## 2023-02-26 DIAGNOSIS — R262 Difficulty in walking, not elsewhere classified: Secondary | ICD-10-CM

## 2023-02-26 DIAGNOSIS — M6281 Muscle weakness (generalized): Secondary | ICD-10-CM

## 2023-02-26 DIAGNOSIS — R2681 Unsteadiness on feet: Secondary | ICD-10-CM
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
## 2023-03-02 DIAGNOSIS — M810 Age-related osteoporosis without current pathological fracture: Secondary | ICD-10-CM | POA: Diagnosis not present

## 2023-03-02 DIAGNOSIS — E559 Vitamin D deficiency, unspecified: Secondary | ICD-10-CM | POA: Diagnosis not present

## 2023-03-02 DIAGNOSIS — N183 Chronic kidney disease, stage 3 unspecified: Secondary | ICD-10-CM | POA: Diagnosis not present

## 2023-03-02 NOTE — Therapy (Signed)
OUTPATIENT PHYSICAL THERAPY LOWER EXTREMITY TREATMENT     Patient Name: Pamela Snyder MRN: [id number] DOB:February 19, 1946, 77 y.o., female Today's Date: 03/03/2023  END OF SESSION:  PT End of Session - 03/03/23 1558     Visit Number 28    Date for PT Re-Evaluation 03/05/23    Authorization Type Medicare    PT Start Time 1554    PT Stop Time 1630    PT Time Calculation (min) 36 min    Equipment Utilized During Treatment Gait belt    Activity Tolerance Patient tolerated treatment well    Behavior During Therapy WFL for tasks assessed/performed                        Past Medical History:  Diagnosis Date   Anxiety    Arthritis    "right foot; spine; hands" (11/23/2014)   Charcot foot due to diabetes mellitus (HCC)    Depression    GERD (gastroesophageal reflux disease)    Gout    Hyperlipemia    Hypertension    Migraine    hx   Neuropathy    Numbness    Thyroid goiter 1986   Type II diabetes mellitus (HCC)    Past Surgical History:  Procedure Laterality Date   ABDOMINAL HYSTERECTOMY  1999   w/BSO   ACHILLES TENDON LENGTHENING Right 11/23/2014   ACHILLES TENDON LENGTHENING Right 11/23/2014   Procedure: RIGHT ACHILLES PERCUTANEOUS TENDON LENGTHENING;  Surgeon: Toni Arthurs, MD;  Location: [facility];  Service: Orthopedics;  Laterality: Right;   APPENDECTOMY  1963   ARTHRODESIS Right 11/23/2014   mid foot   CARDIAC CATHETERIZATION  08/2004   FOOT ARTHRODESIS Right 11/23/2014   Procedure: RIGHT MID FOOT ARTHRODESIS;  Surgeon: Toni Arthurs, MD;  Location: [facility];  Service: Orthopedics;  Laterality: Right;   METATARSAL OSTEOTOMY Right 11/23/2014   Procedure: RIGHT MID FOOT OSTEOTOMY;  Surgeon: Toni Arthurs, MD;  Location: [facility];  Service: Orthopedics;  Laterality: Right;   ORIF ANKLE FRACTURE Left 05/16/2021   Procedure: OPEN REDUCTION INTERNAL FIXATION (ORIF) Left ankle lateral malleolus, possible deltoid ligament repair;  Surgeon: Toni Arthurs, MD;  Location: [facility];   Service: Orthopedics;  Laterality: Left;   OSTEOTOMY Right 11/23/2014   mid foot   PARTIAL THYMECTOMY  1986   ? side   TONSILLECTOMY  1954   Patient Active Problem List   Diagnosis Date Noted   Closed low lateral malleolus fracture 05/16/2021   Gout 05/16/2021   Hyperlipemia 05/16/2021   Hypertension 05/16/2021   Type II diabetes mellitus (HCC) 05/16/2021   Spinal stenosis of lumbar region 03/10/2019   Cervical myelopathy (HCC) 03/10/2019   Peripheral neuropathy 02/02/2019   Gait abnormality 02/02/2019   Urinary urgency 02/02/2019   Chronic bilateral low back pain without sciatica 02/02/2019   DOE (dyspnea on exertion) 10/05/2017   Bruit of right carotid artery 10/05/2017   Coronary artery calcification seen on CT scan 10/05/2017   Chest pain 10/04/2017   Aortic atherosclerosis (HCC) 10/04/2017   Charcot foot due to diabetes mellitus (HCC) 11/23/2014    PCP: Jarrett Soho, PA  REFERRING PROVIDER: Delena Serve, PA  REFERRING DIAG: poor balance, weakness, difficulty walking  THERAPY DIAG:  Muscle weakness (generalized)  Unsteady gait  Difficulty in walking, not elsewhere classified  Rationale for Evaluation and Treatment: Rehabilitation  ONSET DATE: 11/03/22  SUBJECTIVE:    SUBJECTIVE STATEMENT: a little stiff this morning but doing okay now   PERTINENT HISTORY: See  above PAIN:  Are you having pain? Yes: NPRS scale: 0/10 Pain location: low back Pain description: tight, stabbing Aggravating factors: being on feet longer, twisting pain pain up to 6/10 Relieving factors: rest pain will go to 0/10  PRECAUTIONS: Fall  WEIGHT BEARING RESTRICTIONS: No  FALLS:  Has patient fallen in last 6 months? No, just unsteady  LIVING ENVIRONMENT: Lives with: alone Lives in: House/apartment Stairs: Yes: Internal: 12 steps; can reach both Has following equipment at home: Walker - 2 wheeled  OCCUPATION: retired  PLOF: uses walker for household mobility, does her cooking  and cleaning using a FWW  PATIENT GOALS: may go to the beach in the fall, attend women's group, feel stronger, more steady, stay living at home independently  NEXT MD VISIT: none scheduled  OBJECTIVE:   DIAGNOSTIC FINDINGS: none  COGNITION: Overall cognitive status: Within functional limits for tasks assessed     SENSATION: Mid shin down very limited sensation light touch can feel some deeper touch  EDEMA:  Very mild at the ankles  MUSCLE LENGTH: Mild tightness in the HS  POSTURE: rounded shoulders and forward head  PALPATION: Tight and tender in the lumbar paraspinals  LOWER EXTREMITY ROM:  Active ROM Right eval Left eval  Hip flexion 80 80  Hip extension    Hip abduction    Hip adduction    Hip internal rotation    Hip external rotation    Knee flexion    Knee extension 0 0  Ankle dorsiflexion 0 0  Ankle plantarflexion    Ankle inversion    Ankle eversion     (Blank rows = not tested)  LOWER EXTREMITY MMT:  MMT Right eval Left eval  Hip flexion 4 4-  Hip extension 4- 4-  Hip abduction 4 4-  Hip adduction    Hip internal rotation    Hip external rotation    Knee flexion 4 4-  Knee extension 4 3+  Ankle dorsiflexion 2 0  Ankle plantarflexion 1 1  Ankle inversion 1 0  Ankle eversion 0 0   (Blank rows = not tested) FUNCTIONAL TESTS:  TUG: 40 seconds with FWW at evaluation walk test with CGA and FWW = 200 feet at evaluation Unable to stand without holding onto something  GAIT: Distance walked: 200 feet Assistive device utilized: Walker - 2 wheeled Level of assistance: SBA to start and then needs CGA Comments: foot drop bilaterally, decreased foot control, some hip drop on the left side, when fatigued poor safety with transfers   TODAY'S TREATMENT:                                                                                                                              DATE:  03/03/23 NuStep Side steps over WaTe, holding on to table   6# biceps curls with WaTe 2x10 6# chest press with WaTe 2x5 Leg ext 20# 2x12 HS curls 20# 2x12 20# lats 2x10 10#  chest press 2x10 blackTB ext 2x10  02/26/23 Gait outside 1/2 parking [place], rest and then around building to the front, negotiated 3 curbs up and down, needs close CGA to min A at times with this LEg curls 20# Leg extension 20# 15# lats 10# chest press  Black tband abs Side stepping  02/19/23 Walking outside NuStep L5x  3# WaTE curl to push overhead 2x10  Side steps along mat table STS 2x10 20# HS curls 2x10 20# Knee extension 2x10   02/17/23 Gait outside CGA for curbs 2# bar reaching overhead 5# biceps curls 5# chest press 20# HS curls 3x10 20# Knee extension 3 x10 Standing using two QC's 4" alternating toe touches Side stepping with HHA Backward walking with HHA Nustep level 5 x 6 minutes  02/10/23 NuStep L5 x60mins  Seated trunk rotations reaches sitting on mat table x3 each way  Standing in walker no UE support 5s  Side steps along mat table with UE support of PT  Leg ext 20# 2x10 HS curls 25# 2x10  Lat pull downs 20#  STM with massage gun to back    02/05/23 NuStep L5 x25mins  Walk outdoors  STS without LE hitting table 2x5  Standing in RW no support 4s at best  Bicep curl 3# standing in RW 2x10 OHP 1# with one arm standing in RW   02/03/23 NuStep L5 x10 mins  Leg ext 20# 3x10 HS curls 25# 3x10  Leg press 20# 3x10  Lat pull down 20# 3x10 Chest press 20# 3x10  blackTB flexion 2x20 Rows and ext with band legs against table 2x10 Step ups x10    01/29/23 NuStep L5 x42mins  Walking outdoors Seated shoulder flexion #1 2x10 Seated shoulder abd #1 2x10 Fitter pushes 2 blue bands 2x10  Volleyball    PATIENT EDUCATION:  Education details: POC Person educated: Patient Education method: Explanation Education comprehension: verbalized understanding  HOME EXERCISE PROGRAM: TBD  ASSESSMENT:  CLINICAL IMPRESSION: Patient reports  some pain in back and stiffness this morning but it is a bit better at beginning of session. She arrives ~7 mins late to appt. We worked on some general strengthening.    OBJECTIVE IMPAIRMENTS: Abnormal gait, cardiopulmonary status limiting activity, decreased activity tolerance, decreased balance, decreased coordination, decreased endurance, decreased mobility, difficulty walking, decreased ROM, decreased strength, impaired flexibility, impaired sensation, and pain.   REHAB POTENTIAL: Good  CLINICAL DECISION MAKING: Stable/uncomplicated  EVALUATION COMPLEXITY: Low   GOALS: Goals reviewed with patient? Yes  SHORT TERM GOALS: Target date: 12/02/22 Independent with initial HEP Goal status: met 12/04/22  LONG TERM GOALS: Target date: 02/17/23  Independent with advanced HEP or gym program Goal status ongoing 01/15/23  2.  Decrease TUG time to 24 seconds Goal status:ongoing 12/16/22, 17s MET 01/27/23  3.  Walk 300 feet in 3 minute walk test Goal status: ongoing 12/23/22, MET 01/27/23  4.  Be able to negotiate stairs and curbs with SBA with one at a time and a handrail Goal status: progressing 01/01/23, 01/27/23 progressing able to do on 4" but not 6"   5.  Report able to warm up a meal without sitting Goal status: 12/11/22 progressing, MET 01/22/23  6. Patient will demonstrate increase in shoulder ROM to 120d for flexion and abduction  Baseline: 85d flexion, 60d abd  Goal status: 115d flexion, 90d abd progressing  01/27/23  7. Patient will improve L shoulder strength with MMT 4/5   Baseline: 2+ for flexion and 2- for abduction  Goal  status: progressing 01/27/23  8. Patient will be able to stand independently without UE support for 10s  Baseline:6s at best  Goal status: ongoing 02/17/23   PLAN:  PT FREQUENCY: 1-2x/week  PT DURATION: 12 weeks  PLANNED INTERVENTIONS: Therapeutic exercises, Therapeutic activity, Neuromuscular re-education, Balance training, Gait training, Patient/Family  education, Self Care, Joint mobilization, and Manual therapy  PLAN FOR NEXT SESSION: recert if goals not met    Cassie Freer, PT 03/03/2023, 4:30 PM [facility] at [facility] [address] [facility]. [place], [place], [address] Phone: [phone]   Fax:  [phone]

## 2023-03-03 ENCOUNTER — Ambulatory Visit: Payer: Medicare Other

## 2023-03-03 DIAGNOSIS — M6281 Muscle weakness (generalized): Secondary | ICD-10-CM

## 2023-03-03 DIAGNOSIS — R2681 Unsteadiness on feet: Secondary | ICD-10-CM | POA: Diagnosis not present

## 2023-03-03 DIAGNOSIS — R262 Difficulty in walking, not elsewhere classified: Secondary | ICD-10-CM

## 2023-03-04 DIAGNOSIS — M810 Age-related osteoporosis without current pathological fracture: Secondary | ICD-10-CM | POA: Diagnosis not present

## 2023-03-05 ENCOUNTER — Ambulatory Visit: Payer: Medicare Other | Admitting: Physical Therapy

## 2023-03-05 DIAGNOSIS — R262 Difficulty in walking, not elsewhere classified: Secondary | ICD-10-CM

## 2023-03-05 DIAGNOSIS — R2681 Unsteadiness on feet: Secondary | ICD-10-CM

## 2023-03-05 DIAGNOSIS — M6281 Muscle weakness (generalized): Secondary | ICD-10-CM

## 2023-03-05 NOTE — Therapy (Signed)
OUTPATIENT PHYSICAL THERAPY LOWER EXTREMITY TREATMENT     Patient Name: Pamela Snyder MRN: [id number] DOB:July 19, 1945, 77 y.o., female Today's Date: 03/05/2023  END OF SESSION:  PT End of Session - 03/05/23 1700     Visit Number 29    Date for PT Re-Evaluation 03/05/23    Authorization Type Medicare    PT Start Time 1700    PT Stop Time 1745    PT Time Calculation (min) 45 min                        Past Medical History:  Diagnosis Date   Anxiety    Arthritis    "right foot; spine; hands" (11/23/2014)   Charcot foot due to diabetes mellitus (HCC)    Depression    GERD (gastroesophageal reflux disease)    Gout    Hyperlipemia    Hypertension    Migraine    hx   Neuropathy    Numbness    Thyroid goiter 1986   Type II diabetes mellitus (HCC)    Past Surgical History:  Procedure Laterality Date   ABDOMINAL HYSTERECTOMY  1999   w/BSO   ACHILLES TENDON LENGTHENING Right 11/23/2014   ACHILLES TENDON LENGTHENING Right 11/23/2014   Procedure: RIGHT ACHILLES PERCUTANEOUS TENDON LENGTHENING;  Surgeon: Toni Arthurs, MD;  [facility]: [facility];  Service: Orthopedics;  Laterality: Right;   APPENDECTOMY  1963   ARTHRODESIS Right 11/23/2014   mid foot   CARDIAC CATHETERIZATION  08/2004   FOOT ARTHRODESIS Right 11/23/2014   Procedure: RIGHT MID FOOT ARTHRODESIS;  Surgeon: Toni Arthurs, MD;  Location: [facility];  Service: Orthopedics;  Laterality: Right;   METATARSAL OSTEOTOMY Right 11/23/2014   Procedure: RIGHT MID FOOT OSTEOTOMY;  Surgeon: Toni Arthurs, MD;  Location: [facility];  Service: Orthopedics;  Laterality: Right;   ORIF ANKLE FRACTURE Left 05/16/2021   Procedure: OPEN REDUCTION INTERNAL FIXATION (ORIF) Left ankle lateral malleolus, possible deltoid ligament repair;  Surgeon: Toni Arthurs, MD;  Location: [facility];  Service: Orthopedics;  Laterality: Left;   OSTEOTOMY Right 11/23/2014   mid foot   PARTIAL THYMECTOMY  1986   ? side   TONSILLECTOMY  1954   Patient Active  Problem List   Diagnosis Date Noted   Closed low lateral malleolus fracture 05/16/2021   Gout 05/16/2021   Hyperlipemia 05/16/2021   Hypertension 05/16/2021   Type II diabetes mellitus (HCC) 05/16/2021   Spinal stenosis of lumbar region 03/10/2019   Cervical myelopathy (HCC) 03/10/2019   Peripheral neuropathy 02/02/2019   Gait abnormality 02/02/2019   Urinary urgency 02/02/2019   Chronic bilateral low back pain without sciatica 02/02/2019   DOE (dyspnea on exertion) 10/05/2017   Bruit of right carotid artery 10/05/2017   Coronary artery calcification seen on CT scan 10/05/2017   Chest pain 10/04/2017   Aortic atherosclerosis (HCC) 10/04/2017   Charcot foot due to diabetes mellitus (HCC) 11/23/2014    PCP: Jarrett Soho, PA  REFERRING PROVIDER: Delena Serve, PA  REFERRING DIAG: poor balance, weakness, difficulty walking  THERAPY DIAG:  Muscle weakness (generalized)  Unsteady gait  Difficulty in walking, not elsewhere classified  Rationale for Evaluation and Treatment: Rehabilitation  ONSET DATE: 11/03/22  SUBJECTIVE:    SUBJECTIVE STATEMENT:poor endurance  PERTINENT HISTORY: See above PAIN:  Are you having pain? Yes: NPRS scale: 0/10 Pain location: low back Pain description: tight, stabbing Aggravating factors: being on feet longer, twisting pain pain up to 6/10 Relieving factors: rest pain will go  to 0/10  PRECAUTIONS: Fall  WEIGHT BEARING RESTRICTIONS: No  FALLS:  Has patient fallen in last 6 months? No, just unsteady  LIVING ENVIRONMENT: Lives with: alone Lives in: House/apartment Stairs: Yes: Internal: 12 steps; can reach both Has following equipment at home: Walker - 2 wheeled  OCCUPATION: retired  PLOF: uses walker for household mobility, does her cooking and cleaning using a FWW  PATIENT GOALS: may go to the beach in the fall, attend women's group, feel stronger, more steady, stay living at home independently  NEXT MD VISIT: none  scheduled  OBJECTIVE:   DIAGNOSTIC FINDINGS: none  COGNITION: Overall cognitive status: Within functional limits for tasks assessed     SENSATION: Mid shin down very limited sensation light touch can feel some deeper touch  EDEMA:  Very mild at the ankles  MUSCLE LENGTH: Mild tightness in the HS  POSTURE: rounded shoulders and forward head  PALPATION: Tight and tender in the lumbar paraspinals  LOWER EXTREMITY ROM:  Active ROM Right eval Left eval  Hip flexion 80 80  Hip extension    Hip abduction    Hip adduction    Hip internal rotation    Hip external rotation    Knee flexion    Knee extension 0 0  Ankle dorsiflexion 0 0  Ankle plantarflexion    Ankle inversion    Ankle eversion     (Blank rows = not tested)  LOWER EXTREMITY MMT:  MMT Right eval Left eval  Hip flexion 4 4-  Hip extension 4- 4-  Hip abduction 4 4-  Hip adduction    Hip internal rotation    Hip external rotation    Knee flexion 4 4-  Knee extension 4 3+  Ankle dorsiflexion 2 0  Ankle plantarflexion 1 1  Ankle inversion 1 0  Ankle eversion 0 0   (Blank rows = not tested) FUNCTIONAL TESTS:  TUG: 40 seconds with FWW at evaluation walk test with CGA and FWW = 200 feet at evaluation Unable to stand without holding onto something  GAIT: Distance walked: 200 feet Assistive device utilized: Walker - 2 wheeled Level of assistance: SBA to start and then needs CGA Comments: foot drop bilaterally, decreased foot control, some hip drop on the left side, when fatigued poor safety with transfers   TODAY'S TREATMENT:                                                                                                                              DATE:   03/05/23 Nustep L 5 7 min Assessed goals HHA marching fwd and walking backward 10 feet 3 x min A HHA side stepping 10 feet 3 x min A STS HHA 7 x ( trying for 10) working on speed and control 2 sets  6# biceps curls with WaTe 2x10  seated Standing 6# bar flex bar on walker and up 10 min A then chest press 2 sets 5 Red  tband shld ext, row and ER 15x Seated flex and abd 2 sets 5     03/03/23 NuStep Side steps over WaTe, holding on to table  6# biceps curls with WaTe 2x10 6# chest press with WaTe 2x5 Leg ext 20# 2x12 HS curls 20# 2x12 20# lats 2x10 10# chest press 2x10 blackTB ext 2x10  02/26/23 Gait outside 1/2 parking [place], rest and then around building to the front, negotiated 3 curbs up and down, needs close CGA to min A at times with this LEg curls 20# Leg extension 20# 15# lats 10# chest press  Black tband abs Side stepping  02/19/23 Walking outside NuStep L5x  3# WaTE curl to push overhead 2x10  Side steps along mat table STS 2x10 20# HS curls 2x10 20# Knee extension 2x10   02/17/23 Gait outside CGA for curbs 2# bar reaching overhead 5# biceps curls 5# chest press 20# HS curls 3x10 20# Knee extension 3 x10 Standing using two QC's 4" alternating toe touches Side stepping with HHA Backward walking with HHA Nustep level 5 x 6 minutes  02/10/23 NuStep L5 x76mins  Seated trunk rotations reaches sitting on mat table x3 each way  Standing in walker no UE support 5s  Side steps along mat table with UE support of PT  Leg ext 20# 2x10 HS curls 25# 2x10  Lat pull downs 20#  STM with massage gun to back    02/05/23 NuStep L5 x17mins  Walk outdoors  STS without LE hitting table 2x5  Standing in RW no support 4s at best  Bicep curl 3# standing in RW 2x10 OHP 1# with one arm standing in RW   02/03/23 NuStep L5 x10 mins  Leg ext 20# 3x10 HS curls 25# 3x10  Leg press 20# 3x10  Lat pull down 20# 3x10 Chest press 20# 3x10  blackTB flexion 2x20 Rows and ext with band legs against table 2x10 Step ups x10    01/29/23 NuStep L5 x35mins  Walking outdoors Seated shoulder flexion #1 2x10 Seated shoulder abd #1 2x10 Fitter pushes 2 blue bands 2x10  Volleyball    PATIENT EDUCATION:   Education details: POC Person educated: Patient Education method: Explanation Education comprehension: verbalized understanding  HOME EXERCISE PROGRAM: TBD  ASSESSMENT:  CLINICAL IMPRESSION: Pt states still shaky, low endurance and poor balance. Pt states out with a group of friends and they were older and do better then her. Goals assessed and documented progress.   OBJECTIVE IMPAIRMENTS: Abnormal gait, cardiopulmonary status limiting activity, decreased activity tolerance, decreased balance, decreased coordination, decreased endurance, decreased mobility, difficulty walking, decreased ROM, decreased strength, impaired flexibility, impaired sensation, and pain.   REHAB POTENTIAL: Good  CLINICAL DECISION MAKING: Stable/uncomplicated  EVALUATION COMPLEXITY: Low   GOALS: Goals reviewed with patient? Yes  SHORT TERM GOALS: Target date: 12/02/22 Independent with initial HEP Goal status: met 12/04/22  LONG TERM GOALS: Target date: 02/17/23  Independent with advanced HEP or gym program Goal status ongoing 01/15/23  03/05/23 evolving  2.  Decrease TUG time to 24 seconds Goal status:ongoing 12/16/22, 17s MET 01/27/23  3.  Walk 300 feet in 3 minute walk test Goal status: ongoing 12/23/22, MET 01/27/23  4.  Be able to negotiate stairs and curbs with SBA with one at a time and a handrail Goal status: progressing 01/01/23, 01/27/23 progressing able to do on 4" but not 6"  03/05/23 progressing  5.  Report able to warm up a meal without sitting Goal status: 12/11/22 progressing, MET  01/22/23  6. Patient will demonstrate increase in shoulder ROM to 120d for flexion and abduction  Baseline: 85d flexion, 60d abd  Goal status: 115d flexion, 90d abd progressing  01/27/23 03/05/23 act flex 145, abd 120 MET  7. Patient will improve L shoulder strength with MMT 4/5   Baseline: 2+ for flexion and 2- for abduction  Goal status: progressing 01/27/23  03/05/23 3- flex and abd  8. Patient will be able to  stand independently without UE support for 10s  Baseline:6s at best  Goal status: ongoing 02/17/23  03/05/23 progressing 3-5 sec   PLAN:  PT FREQUENCY: 1-2x/week  PT DURATION: 12 weeks  PLANNED INTERVENTIONS: Therapeutic exercises, Therapeutic activity, Neuromuscular re-education, Balance training, Gait training, Patient/Family education, Self Care, Joint mobilization, and Manual therapy  PLAN FOR NEXT SESSIONSuanne Marker, PTA 03/05/2023, 5:00 PM [facility] Out[facility] Rehabilitation at [facility] [address] [facility]. [place], [place], [address] Phone: [phone]   Fax:  [phone] at [facility] [address] [facility] [place]. [place], [place], [address] Phone: [phone]   Fax:  [phone]  Patient Details  Name: Pamela Snyder MRN: [id number] Date of Birth: 1945-08-20 Referring Provider:  Jarrett Soho, PA-C

## 2023-03-10 NOTE — Therapy (Signed)
OUTPATIENT PHYSICAL THERAPY LOWER EXTREMITY TREATMENT Progress Note Reporting Period 01/27/23 to 03/11/23  See note below for Objective Data and Assessment of Progress/Goals.        Patient Name: Pamela Snyder MRN: [id number] DOB:1946/04/12, 77 y.o., female Today's Date: 03/11/2023  END OF SESSION:  PT End of Session - 03/11/23 1615     Visit Number 30    Date for PT Re-Evaluation 04/16/23    Authorization Type Medicare    PT Start Time 1615    PT Stop Time 1655    PT Time Calculation (min) 40 min                         Past Medical History:  Diagnosis Date   Anxiety    Arthritis    "right foot; spine; hands" (11/23/2014)   Charcot foot due to diabetes mellitus (HCC)    Depression    GERD (gastroesophageal reflux disease)    Gout    Hyperlipemia    Hypertension    Migraine    hx   Neuropathy    Numbness    Thyroid goiter 1986   Type II diabetes mellitus (HCC)    Past Surgical History:  Procedure Laterality Date   ABDOMINAL HYSTERECTOMY  1999   w/BSO   ACHILLES TENDON LENGTHENING Right 11/23/2014   ACHILLES TENDON LENGTHENING Right 11/23/2014   Procedure: RIGHT ACHILLES PERCUTANEOUS TENDON LENGTHENING;  Surgeon: Toni Arthurs, MD;  Location: [facility];  Service: Orthopedics;  Laterality: Right;   APPENDECTOMY  1963   ARTHRODESIS Right 11/23/2014   mid foot   CARDIAC CATHETERIZATION  08/2004   FOOT ARTHRODESIS Right 11/23/2014   Procedure: RIGHT MID FOOT ARTHRODESIS;  Surgeon: Toni Arthurs, MD;  Location: [facility];  Service: Orthopedics;  Laterality: Right;   METATARSAL OSTEOTOMY Right 11/23/2014   Procedure: RIGHT MID FOOT OSTEOTOMY;  Surgeon: Toni Arthurs, MD;  Location: [facility];  Service: Orthopedics;  Laterality: Right;   ORIF ANKLE FRACTURE Left 05/16/2021   Procedure: OPEN REDUCTION INTERNAL FIXATION (ORIF) Left ankle lateral malleolus, possible deltoid ligament repair;  Surgeon: Toni Arthurs, MD;  Location: [facility];  Service: Orthopedics;  Laterality: Left;    OSTEOTOMY Right 11/23/2014   mid foot   PARTIAL THYMECTOMY  1986   ? side   TONSILLECTOMY  1954   Patient Active Problem List   Diagnosis Date Noted   Closed low lateral malleolus fracture 05/16/2021   Gout 05/16/2021   Hyperlipemia 05/16/2021   Hypertension 05/16/2021   Type II diabetes mellitus (HCC) 05/16/2021   Spinal stenosis of lumbar region 03/10/2019   Cervical myelopathy (HCC) 03/10/2019   Peripheral neuropathy 02/02/2019   Gait abnormality 02/02/2019   Urinary urgency 02/02/2019   Chronic bilateral low back pain without sciatica 02/02/2019   DOE (dyspnea on exertion) 10/05/2017   Bruit of right carotid artery 10/05/2017   Coronary artery calcification seen on CT scan 10/05/2017   Chest pain 10/04/2017   Aortic atherosclerosis (HCC) 10/04/2017   Charcot foot due to diabetes mellitus (HCC) 11/23/2014    PCP: Jarrett Soho, PA  REFERRING PROVIDER: Delena Serve, PA  REFERRING DIAG: poor balance, weakness, difficulty walking  THERAPY DIAG:  Muscle weakness (generalized)  Unsteady gait  Difficulty in walking, not elsewhere classified  Rationale for Evaluation and Treatment: Rehabilitation  ONSET DATE: 11/03/22  SUBJECTIVE:    SUBJECTIVE STATEMENT: not doing well, having bad allergies for the last 4-5 days.   PERTINENT HISTORY: See above PAIN:  Are  you having pain? Yes: NPRS scale: 0/10 Pain location: low back Pain description: tight, stabbing Aggravating factors: being on feet longer, twisting pain pain up to 6/10 Relieving factors: rest pain will go to 0/10  PRECAUTIONS: Fall  WEIGHT BEARING RESTRICTIONS: No  FALLS:  Has patient fallen in last 6 months? No, just unsteady  LIVING ENVIRONMENT: Lives with: alone Lives in: House/apartment Stairs: Yes: Internal: 12 steps; can reach both Has following equipment at home: Walker - 2 wheeled  OCCUPATION: retired  PLOF: uses walker for household mobility, does her cooking and cleaning using a  FWW  PATIENT GOALS: may go to the beach in the fall, attend women's group, feel stronger, more steady, stay living at home independently  NEXT MD VISIT: none scheduled  OBJECTIVE:   DIAGNOSTIC FINDINGS: none  COGNITION: Overall cognitive status: Within functional limits for tasks assessed     SENSATION: Mid shin down very limited sensation light touch can feel some deeper touch  EDEMA:  Very mild at the ankles  MUSCLE LENGTH: Mild tightness in the HS  POSTURE: rounded shoulders and forward head  PALPATION: Tight and tender in the lumbar paraspinals  LOWER EXTREMITY ROM:  Active ROM Right eval Left eval  Hip flexion 80 80  Hip extension    Hip abduction    Hip adduction    Hip internal rotation    Hip external rotation    Knee flexion    Knee extension 0 0  Ankle dorsiflexion 0 0  Ankle plantarflexion    Ankle inversion    Ankle eversion     (Blank rows = not tested)  LOWER EXTREMITY MMT:  MMT Right eval Left eval  Hip flexion 4 4-  Hip extension 4- 4-  Hip abduction 4 4-  Hip adduction    Hip internal rotation    Hip external rotation    Knee flexion 4 4-  Knee extension 4 3+  Ankle dorsiflexion 2 0  Ankle plantarflexion 1 1  Ankle inversion 1 0  Ankle eversion 0 0   (Blank rows = not tested) FUNCTIONAL TESTS:  TUG: 40 seconds with FWW at evaluation walk test with CGA and FWW = 200 feet at evaluation Unable to stand without holding onto something  GAIT: Distance walked: 200 feet Assistive device utilized: Walker - 2 wheeled Level of assistance: SBA to start and then needs CGA Comments: foot drop bilaterally, decreased foot control, some hip drop on the left side, when fatigued poor safety with transfers   TODAY'S TREATMENT:                                                                                                                              DATE:  03/11/23 NuStep L5 x41mins  OHP yellow ball 2x10 Shoulder ext and rows green  2x10 Red band ER RUE 2x10 Standing marches 3# 2x10 LAQ 3# 2x10   03/05/23 Nustep L 5 7 min Assessed goals HHA marching  fwd and walking backward 10 feet 3 x min A HHA side stepping 10 feet 3 x min A STS HHA 7 x ( trying for 10) working on speed and control 2 sets  6# biceps curls with WaTe 2x10 seated Standing 6# bar flex bar on walker and up 10 min A then chest press 2 sets 5 Red tband shld ext, row and ER 15x Seated flex and abd 2 sets 5   03/03/23 NuStep Side steps over WaTe, holding on to table  6# biceps curls with WaTe 2x10 6# chest press with WaTe 2x5 Leg ext 20# 2x12 HS curls 20# 2x12 20# lats 2x10 10# chest press 2x10 blackTB ext 2x10  02/26/23 Gait outside 1/2 parking [place], rest and then around building to the front, negotiated 3 curbs up and down, needs close CGA to min A at times with this LEg curls 20# Leg extension 20# 15# lats 10# chest press  Black tband abs Side stepping  02/19/23 Walking outside NuStep L5x  3# WaTE curl to push overhead 2x10  Side steps along mat table STS 2x10 20# HS curls 2x10 20# Knee extension 2x10   02/17/23 Gait outside CGA for curbs 2# bar reaching overhead 5# biceps curls 5# chest press 20# HS curls 3x10 20# Knee extension 3 x10 Standing using two QC's 4" alternating toe touches Side stepping with HHA Backward walking with HHA Nustep level 5 x 6 minutes  02/10/23 NuStep L5 x79mins  Seated trunk rotations reaches sitting on mat table x3 each way  Standing in walker no UE support 5s  Side steps along mat table with UE support of PT  Leg ext 20# 2x10 HS curls 25# 2x10  Lat pull downs 20#  STM with massage gun to back    02/05/23 NuStep L5 x35mins  Walk outdoors  STS without LE hitting table 2x5  Standing in RW no support 4s at best  Bicep curl 3# standing in RW 2x10 OHP 1# with one arm standing in RW   02/03/23 NuStep L5 x10 mins  Leg ext 20# 3x10 HS curls 25# 3x10  Leg press 20# 3x10  Lat pull down  20# 3x10 Chest press 20# 3x10  blackTB flexion 2x20 Rows and ext with band legs against table 2x10 Step ups x10    01/29/23 NuStep L5 x31mins  Walking outdoors Seated shoulder flexion #1 2x10 Seated shoulder abd #1 2x10 Fitter pushes 2 blue bands 2x10  Volleyball    PATIENT EDUCATION:  Education details: POC Person educated: Patient Education method: Explanation Education comprehension: verbalized understanding  HOME EXERCISE PROGRAM: TBD  ASSESSMENT:  CLINICAL IMPRESSION: Patient not doing well, having issues with allergies. She feels weak. Her face is puffy and red, reports some SOB and congestion. Has been taken Zyrtec but does not seem to be helping. She is warm in temperature but denies having a fever. Low endurance and fatigue throughout session. Unable to tolerate much today.   OBJECTIVE IMPAIRMENTS: Abnormal gait, cardiopulmonary status limiting activity, decreased activity tolerance, decreased balance, decreased coordination, decreased endurance, decreased mobility, difficulty walking, decreased ROM, decreased strength, impaired flexibility, impaired sensation, and pain.   REHAB POTENTIAL: Good  CLINICAL DECISION MAKING: Stable/uncomplicated  EVALUATION COMPLEXITY: Low   GOALS: Goals reviewed with patient? Yes  SHORT TERM GOALS: Target date: 12/02/22 Independent with initial HEP Goal status: met 12/04/22  LONG TERM GOALS: Target date: 02/17/23  Independent with advanced HEP or gym program Goal status ongoing 01/15/23  03/05/23 evolving  2.  Decrease  TUG time to 24 seconds Goal status:ongoing 12/16/22, 17s MET 01/27/23  3.  Walk 300 feet in 3 minute walk test Goal status: ongoing 12/23/22, MET 01/27/23  4.  Be able to negotiate stairs and curbs with SBA with one at a time and a handrail Goal status: progressing 01/01/23, 01/27/23 progressing able to do on 4" but not 6"  03/05/23 progressing, ongoing 03/11/23  5.  Report able to warm up a meal without sitting Goal  status: 12/11/22 progressing, MET 01/22/23  6. Patient will demonstrate increase in shoulder ROM to 120d for flexion and abduction  Baseline: 85d flexion, 60d abd  Goal status: 115d flexion, 90d abd progressing  01/27/23 03/05/23 act flex 145, abd 120 MET  7. Patient will improve L shoulder strength with MMT 4/5   Baseline: 2+ for flexion and 2- for abduction  Goal status: progressing 01/27/23  03/05/23 3- flex and abd, ongoing 03/11/23  8. Patient will be able to stand independently without UE support for 10s  Baseline:6s at best  Goal status: ongoing 02/17/23  03/05/23 progressing 3-5 sec, ongoing 03/11/23   PLAN:  PT FREQUENCY: 1-2x/week  PT DURATION: 12 weeks  PLANNED INTERVENTIONS: Therapeutic exercises, Therapeutic activity, Neuromuscular re-education, Balance training, Gait training, Patient/Family education, Self Care, Joint mobilization, and Manual therapy  PLAN FOR NEXT SESSION:    Cassie Freer, PT 03/11/2023, 4:55 PM [facility] [address] [facility] [place], [place], [address] Phone: [phone]   Fax:  [phone] [address] [address] [facility] [address] [place], [place], [address] Phone: [phone]   Fax:  [phone]  Patient Details  Name: Pamela Snyder MRN: [id number] Date of Birth: [birth year] Referring Provider:  Jarrett Soho, PA-C

## 2023-03-11 ENCOUNTER — Ambulatory Visit: Payer: Medicare Other | Attending: Family Medicine

## 2023-03-11 DIAGNOSIS — R262 Difficulty in walking, not elsewhere classified: Secondary | ICD-10-CM | POA: Insufficient documentation

## 2023-03-11 DIAGNOSIS — R2681 Unsteadiness on feet: Secondary | ICD-10-CM | POA: Diagnosis not present

## 2023-03-11 DIAGNOSIS — M6281 Muscle weakness (generalized): Secondary | ICD-10-CM | POA: Diagnosis not present

## 2023-03-12 ENCOUNTER — Ambulatory Visit: Payer: Medicare Other | Admitting: Physical Therapy

## 2023-03-24 ENCOUNTER — Ambulatory Visit: Payer: Medicare Other

## 2023-03-26 ENCOUNTER — Ambulatory Visit: Payer: Medicare Other | Admitting: Physical Therapy

## 2023-03-26 ENCOUNTER — Encounter: Payer: Self-pay | Admitting: Physical Therapy

## 2023-03-26 DIAGNOSIS — R262 Difficulty in walking, not elsewhere classified: Secondary | ICD-10-CM

## 2023-03-26 DIAGNOSIS — M6281 Muscle weakness (generalized): Secondary | ICD-10-CM | POA: Diagnosis not present

## 2023-03-26 DIAGNOSIS — R2681 Unsteadiness on feet: Secondary | ICD-10-CM | POA: Diagnosis not present

## 2023-03-26 NOTE — Therapy (Signed)
OUTPATIENT PHYSICAL THERAPY LOWER EXTREMITY TREATMENT      Patient Name: Pamela Snyder MRN: [id number] DOB:12/14/1945, 77 y.o., female Today's Date: 03/26/2023  END OF SESSION:  PT End of Session - 03/26/23 1743     Visit Number 31    Date for PT Re-Evaluation 04/16/23    Authorization Type Medicare    PT Start Time 1740    PT Stop Time 1830    PT Time Calculation (min) 50 min    Equipment Utilized During Treatment Gait belt    Activity Tolerance Patient tolerated treatment well    Behavior During Therapy WFL for tasks assessed/performed                         Past Medical History:  Diagnosis Date   Anxiety    Arthritis    "right foot; spine; hands" (11/23/2014)   Charcot foot due to diabetes mellitus (HCC)    Depression    GERD (gastroesophageal reflux disease)    Gout    Hyperlipemia    Hypertension    Migraine    hx   Neuropathy    Numbness    Thyroid goiter 1986   Type II diabetes mellitus (HCC)    Past Surgical History:  Procedure Laterality Date   ABDOMINAL HYSTERECTOMY  1999   w/BSO   ACHILLES TENDON LENGTHENING Right 11/23/2014   ACHILLES TENDON LENGTHENING Right 11/23/2014   Procedure: RIGHT ACHILLES PERCUTANEOUS TENDON LENGTHENING;  Surgeon: Toni Arthurs, MD;  Location: [facility];  Service: Orthopedics;  Laterality: Right;   APPENDECTOMY  1963   ARTHRODESIS Right 11/23/2014   mid foot   CARDIAC CATHETERIZATION  08/2004   FOOT ARTHRODESIS Right 11/23/2014   Procedure: RIGHT MID FOOT ARTHRODESIS;  Surgeon: Toni Arthurs, MD;  Location: [facility];  Service: Orthopedics;  Laterality: Right;   METATARSAL OSTEOTOMY Right 11/23/2014   Procedure: RIGHT MID FOOT OSTEOTOMY;  Surgeon: Toni Arthurs, MD;  Location: [facility];  Service: Orthopedics;  Laterality: Right;   ORIF ANKLE FRACTURE Left 05/16/2021   Procedure: OPEN REDUCTION INTERNAL FIXATION (ORIF) Left ankle lateral malleolus, possible deltoid ligament repair;  Surgeon: Toni Arthurs, MD;  Location: [facility];  Service: Orthopedics;  Laterality: Left;   OSTEOTOMY Right 11/23/2014   mid foot   PARTIAL THYMECTOMY  1986   ? side   TONSILLECTOMY  1954   Patient Active Problem List   Diagnosis Date Noted   Closed low lateral malleolus fracture 05/16/2021   Gout 05/16/2021   Hyperlipemia 05/16/2021   Hypertension 05/16/2021   Type II diabetes mellitus (HCC) 05/16/2021   Spinal stenosis of lumbar region 03/10/2019   Cervical myelopathy (HCC) 03/10/2019   Peripheral neuropathy 02/02/2019   Gait abnormality 02/02/2019   Urinary urgency 02/02/2019   Chronic bilateral low back pain without sciatica 02/02/2019   DOE (dyspnea on exertion) 10/05/2017   Bruit of right carotid artery 10/05/2017   Coronary artery calcification seen on CT scan 10/05/2017   Chest pain 10/04/2017   Aortic atherosclerosis (HCC) 10/04/2017   Charcot foot due to diabetes mellitus (HCC) 11/23/2014    PCP: Jarrett Soho, PA  REFERRING PROVIDER: Delena Serve, PA  REFERRING DIAG: poor balance, weakness, difficulty walking  THERAPY DIAG:  Muscle weakness (generalized)  Unsteady gait  Difficulty in walking, not elsewhere classified  Rationale for Evaluation and Treatment: Rehabilitation  ONSET DATE: 11/03/22  SUBJECTIVE:    SUBJECTIVE STATEMENT: Patient has not been in to see [place] in 2 weeks, was  sick and then went out of town, she reports that she has had some difficulty walking  PERTINENT HISTORY: See above PAIN:  Are you having pain? Yes: NPRS scale: 0/10 Pain location: low back Pain description: tight, stabbing Aggravating factors: being on feet longer, twisting pain pain up to 6/10 Relieving factors: rest pain will go to 0/10  PRECAUTIONS: Fall  WEIGHT BEARING RESTRICTIONS: No  FALLS:  Has patient fallen in last 6 months? No, just unsteady  LIVING ENVIRONMENT: Lives with: alone Lives in: House/apartment Stairs: Yes: Internal: 12 steps; can reach both Has following equipment at home: Walker - 2  wheeled  OCCUPATION: retired  PLOF: uses walker for household mobility, does her cooking and cleaning using a FWW  PATIENT GOALS: may go to the beach in the fall, attend women's group, feel stronger, more steady, stay living at home independently  NEXT MD VISIT: none scheduled  OBJECTIVE:   DIAGNOSTIC FINDINGS: none  COGNITION: Overall cognitive status: Within functional limits for tasks assessed     SENSATION: Mid shin down very limited sensation light touch can feel some deeper touch  EDEMA:  Very mild at the ankles  MUSCLE LENGTH: Mild tightness in the HS  POSTURE: rounded shoulders and forward head  PALPATION: Tight and tender in the lumbar paraspinals  LOWER EXTREMITY ROM:  Active ROM Right eval Left eval  Hip flexion 80 80  Hip extension    Hip abduction    Hip adduction    Hip internal rotation    Hip external rotation    Knee flexion    Knee extension 0 0  Ankle dorsiflexion 0 0  Ankle plantarflexion    Ankle inversion    Ankle eversion     (Blank rows = not tested)  LOWER EXTREMITY MMT:  MMT Right eval Left eval  Hip flexion 4 4-  Hip extension 4- 4-  Hip abduction 4 4-  Hip adduction    Hip internal rotation    Hip external rotation    Knee flexion 4 4-  Knee extension 4 3+  Ankle dorsiflexion 2 0  Ankle plantarflexion 1 1  Ankle inversion 1 0  Ankle eversion 0 0   (Blank rows = not tested) FUNCTIONAL TESTS:  TUG: 40 seconds with FWW at evaluation walk test with CGA and FWW = 200 feet at evaluation Unable to stand without holding onto something  GAIT: Distance walked: 200 feet Assistive device utilized: Walker - 2 wheeled Level of assistance: SBA to start and then needs CGA Comments: foot drop bilaterally, decreased foot control, some hip drop on the left side, when fatigued poor safety with transfers   TODAY'S TREATMENT:                                                                                                                               DATE:  03/26/23 Nustp level 5 x 9 minutes LEg curls 25# 3x10 Leg extension 15# 3x10  Gait outside with CGA, min A for curb negotiating up and down, has difficulty with walker placement and really struggled requiring assist to step up on the curbs.   3# hip abduction marches  03/11/23 NuStep L5 x36mins  OHP yellow ball 2x10 Shoulder ext and rows green 2x10 Red band ER RUE 2x10 Standing marches 3# 2x10 LAQ 3# 2x10   03/05/23 Nustep L 5 7 min Assessed goals HHA marching fwd and walking backward 10 feet 3 x min A HHA side stepping 10 feet 3 x min A STS HHA 7 x ( trying for 10) working on speed and control 2 sets  6# biceps curls with WaTe 2x10 seated Standing 6# bar flex bar on walker and up 10 min A then chest press 2 sets 5 Red tband shld ext, row and ER 15x Seated flex and abd 2 sets 5   03/03/23 NuStep Side steps over WaTe, holding on to table  6# biceps curls with WaTe 2x10 6# chest press with WaTe 2x5 Leg ext 20# 2x12 HS curls 20# 2x12 20# lats 2x10 10# chest press 2x10 blackTB ext 2x10  02/26/23 Gait outside 1/2 parking [place], rest and then around building to the front, negotiated 3 curbs up and down, needs close CGA to min A at times with this LEg curls 20# Leg extension 20# 15# lats 10# chest press  Black tband abs Side stepping  02/19/23 Walking outside NuStep L5x  3# WaTE curl to push overhead 2x10  Side steps along mat table STS 2x10 20# HS curls 2x10 20# Knee extension 2x10   02/17/23 Gait outside CGA for curbs 2# bar reaching overhead 5# biceps curls 5# chest press 20# HS curls 3x10 20# Knee extension 3 x10 Standing using two QC's 4" alternating toe touches Side stepping with HHA Backward walking with HHA Nustep level 5 x 6 minutes  02/10/23 NuStep L5 x41mins  Seated trunk rotations reaches sitting on mat table x3 each way  Standing in walker no UE support 5s  Side steps along mat table with UE support of PT  Leg  ext 20# 2x10 HS curls 25# 2x10  Lat pull downs 20#  STM with massage gun to back    02/05/23 NuStep L5 x98mins  Walk outdoors  STS without LE hitting table 2x5  Standing in RW no support 4s at best  Bicep curl 3# standing in RW 2x10 OHP 1# with one arm standing in RW   02/03/23 NuStep L5 x10 mins  Leg ext 20# 3x10 HS curls 25# 3x10  Leg press 20# 3x10  Lat pull down 20# 3x10 Chest press 20# 3x10  blackTB flexion 2x20 Rows and ext with band legs against table 2x10 Step ups x10    01/29/23 NuStep L5 x81mins  Walking outdoors Seated shoulder flexion #1 2x10 Seated shoulder abd #1 2x10 Fitter pushes 2 blue bands 2x10  Volleyball    PATIENT EDUCATION:  Education details: POC Person educated: Patient Education method: Explanation Education comprehension: verbalized understanding  HOME EXERCISE PROGRAM: TBD  ASSESSMENT:  CLINICAL IMPRESSION: Patient reports that she has not done well recently due to an illness, and then was out of town, reports that she could not do much when she went out of town and compared it to when she went the same place last fall, reports that she had much more difficulty but is not sure if it is regression in function or just her having been sick, she did well with the walking today just needed  a few rest breaks and then the thing that I saw was more assist to step up onto the curb due to the weakness   OBJECTIVE IMPAIRMENTS: Abnormal gait, cardiopulmonary status limiting activity, decreased activity tolerance, decreased balance, decreased coordination, decreased endurance, decreased mobility, difficulty walking, decreased ROM, decreased strength, impaired flexibility, impaired sensation, and pain.   REHAB POTENTIAL: Good  CLINICAL DECISION MAKING: Stable/uncomplicated  EVALUATION COMPLEXITY: Low   GOALS: Goals reviewed with patient? Yes  SHORT TERM GOALS: Target date: 12/02/22 Independent with initial HEP Goal status: met 12/04/22  LONG  TERM GOALS: Target date: 02/17/23  Independent with advanced HEP or gym program Goal status ongoing 01/15/23  03/05/23 evolving  2.  Decrease TUG time to 24 seconds Goal status:ongoing 12/16/22, 17s MET 01/27/23  3.  Walk 300 feet in 3 minute walk test Goal status: ongoing 12/23/22, MET 01/27/23  4.  Be able to negotiate stairs and curbs with SBA with one at a time and a handrail Goal status: progressing 01/01/23, 01/27/23 progressing able to do on 4" but not 6"  03/05/23 progressing, ongoing 03/11/23  5.  Report able to warm up a meal without sitting Goal status: 12/11/22 progressing, MET 01/22/23  6. Patient will demonstrate increase in shoulder ROM to 120d for flexion and abduction  Baseline: 85d flexion, 60d abd  Goal status: 115d flexion, 90d abd progressing  01/27/23 03/05/23 act flex 145, abd 120 MET  7. Patient will improve L shoulder strength with MMT 4/5   Baseline: 2+ for flexion and 2- for abduction  Goal status: progressing 01/27/23  03/05/23 3- flex and abd, ongoing 03/11/23  8. Patient will be able to stand independently without UE support for 10s  Baseline:6s at best  Goal status: ongoing 02/17/23  03/05/23 progressing 3-5 sec, ongoing 03/11/23   PLAN:  PT FREQUENCY: 1-2x/week  PT DURATION: 12 weeks  PLANNED INTERVENTIONS: Therapeutic exercises, Therapeutic activity, Neuromuscular re-education, Balance training, Gait training, Patient/Family education, Self Care, Joint mobilization, and Manual therapy  PLAN FOR NEXT SESSION: need to work on stepping up on the curbs, could replicte in clinic with 4" and 6" step and the leg press or fitter   Jearld Lesch, PT 03/26/2023, 5:44 PM [facility] [address] [facility] [place], [place], [address] Phone: [phone]   Fax:  [phone] [address] [place], [place], [address] Phone: [phone]   Fax:   [phone]  Patient Details  Name: Pamela Snyder MRN: [id number] Date of Birth: October 03, 1945 Referring Provider:  Jarrett Soho, PA-C

## 2023-03-31 ENCOUNTER — Ambulatory Visit: Payer: Medicare Other

## 2023-04-02 ENCOUNTER — Ambulatory Visit: Payer: Medicare Other | Admitting: Physical Therapy

## 2023-04-02 ENCOUNTER — Encounter: Payer: Self-pay | Admitting: Physical Therapy

## 2023-04-02 DIAGNOSIS — R2681 Unsteadiness on feet: Secondary | ICD-10-CM

## 2023-04-02 DIAGNOSIS — M6281 Muscle weakness (generalized): Secondary | ICD-10-CM | POA: Diagnosis not present

## 2023-04-02 DIAGNOSIS — R262 Difficulty in walking, not elsewhere classified: Secondary | ICD-10-CM

## 2023-04-02 NOTE — Therapy (Signed)
OUTPATIENT PHYSICAL THERAPY LOWER EXTREMITY TREATMENT      Patient Name: Pamela Snyder MRN: [id number] DOB:11-10-1945, 77 y.o., female Today's Date: 04/02/2023  END OF SESSION:  PT End of Session - 04/02/23 1748     Visit Number 32    Date for PT Re-Evaluation 04/16/23    Authorization Type Medicare    PT Start Time 1742    PT Stop Time 1833    PT Time Calculation (min) 51 min    Equipment Utilized During Treatment Gait belt    Activity Tolerance Patient tolerated treatment well    Behavior During Therapy WFL for tasks assessed/performed                         Past Medical History:  Diagnosis Date   Anxiety    Arthritis    "right foot; spine; hands" (11/23/2014)   Charcot foot due to diabetes mellitus (HCC)    Depression    GERD (gastroesophageal reflux disease)    Gout    Hyperlipemia    Hypertension    Migraine    hx   Neuropathy    Numbness    Thyroid goiter 1986   Type II diabetes mellitus (HCC)    Past Surgical History:  Procedure Laterality Date   ABDOMINAL HYSTERECTOMY  1999   w/BSO   ACHILLES TENDON LENGTHENING Right 11/23/2014   ACHILLES TENDON LENGTHENING Right 11/23/2014   Procedure: RIGHT ACHILLES PERCUTANEOUS TENDON LENGTHENING;  Surgeon: Toni Arthurs, MD;  [facility];  Service: Orthopedics;  Laterality: Right;   APPENDECTOMY  1963   ARTHRODESIS Right 11/23/2014   mid foot   CARDIAC CATHETERIZATION  08/2004   FOOT ARTHRODESIS Right 11/23/2014   Procedure: RIGHT MID FOOT ARTHRODESIS;  Surgeon: Toni Arthurs, MD;  [facility]: [facility];  Service: Orthopedics;  Laterality: Right;   METATARSAL OSTEOTOMY Right 11/23/2014   Procedure: RIGHT MID FOOT OSTEOTOMY;  Surgeon: Toni Arthurs, MD;  [facility];  Service: Orthopedics;  Laterality: Right;   ORIF ANKLE FRACTURE Left 05/16/2021   Procedure: OPEN REDUCTION INTERNAL FIXATION (ORIF) Left ankle lateral malleolus, possible deltoid ligament repair;  Surgeon: Toni Arthurs, MD;  [facility];  Service: Orthopedics;  Laterality: Left;   OSTEOTOMY Right 11/23/2014   mid foot   PARTIAL THYMECTOMY  1986   ? side   TONSILLECTOMY  1954   Patient Active Problem List   Diagnosis Date Noted   Closed low lateral malleolus fracture 05/16/2021   Gout 05/16/2021   Hyperlipemia 05/16/2021   Hypertension 05/16/2021   Type II diabetes mellitus (HCC) 05/16/2021   Spinal stenosis of lumbar region 03/10/2019   Cervical myelopathy (HCC) 03/10/2019   Peripheral neuropathy 02/02/2019   Gait abnormality 02/02/2019   Urinary urgency 02/02/2019   Chronic bilateral low back pain without sciatica 02/02/2019   DOE (dyspnea on exertion) 10/05/2017   Bruit of right carotid artery 10/05/2017   Coronary artery calcification seen on CT scan 10/05/2017   Chest pain 10/04/2017   Aortic atherosclerosis (HCC) 10/04/2017   Charcot foot due to diabetes mellitus (HCC) 11/23/2014    PCP: Jarrett Soho, PA  REFERRING PROVIDER: Delena Serve, PA  REFERRING DIAG: poor balance, weakness, difficulty walking  THERAPY DIAG:  Muscle weakness (generalized)  Unsteady gait  Difficulty in walking, not elsewhere classified  Rationale for Evaluation and Treatment: Rehabilitation  ONSET DATE: 11/03/22  SUBJECTIVE:    SUBJECTIVE STATEMENT: Patient reports another cancel due to her ride has been sick  OUTPATIENT PHYSICAL THERAPY LOWER EXTREMITY TREATMENT      Patient Name: Pamela Snyder MRN: [id number] DOB:11-10-1945, 77 y.o., female Today's Date: 04/02/2023  END OF SESSION:  PT End of Session - 04/02/23 1748     Visit Number 32    Date for PT Re-Evaluation 04/16/23    Authorization Type Medicare    PT Start Time 1742    PT Stop Time 1833    PT Time Calculation (min) 51 min    Equipment Utilized During Treatment Gait belt    Activity Tolerance Patient tolerated treatment well    Behavior During Therapy WFL for tasks assessed/performed                         Past Medical History:  Diagnosis Date   Anxiety    Arthritis    "right foot; spine; hands" (11/23/2014)   Charcot foot due to diabetes mellitus (HCC)    Depression    GERD (gastroesophageal reflux disease)    Gout    Hyperlipemia    Hypertension    Migraine    hx   Neuropathy    Numbness    Thyroid goiter 1986   Type II diabetes mellitus (HCC)    Past Surgical History:  Procedure Laterality Date   ABDOMINAL HYSTERECTOMY  1999   w/BSO   ACHILLES TENDON LENGTHENING Right 11/23/2014   ACHILLES TENDON LENGTHENING Right 11/23/2014   Procedure: RIGHT ACHILLES PERCUTANEOUS TENDON LENGTHENING;  Surgeon: Toni Arthurs, MD;  [facility];  Service: Orthopedics;  Laterality: Right;   APPENDECTOMY  1963   ARTHRODESIS Right 11/23/2014   mid foot   CARDIAC CATHETERIZATION  08/2004   FOOT ARTHRODESIS Right 11/23/2014   Procedure: RIGHT MID FOOT ARTHRODESIS;  Surgeon: Toni Arthurs, MD;  [facility];  Service: Orthopedics;  Laterality: Right;   METATARSAL OSTEOTOMY Right 11/23/2014   Procedure: RIGHT MID FOOT OSTEOTOMY;  Surgeon: Toni Arthurs, MD;  [facility];  Service: Orthopedics;  Laterality: Right;   ORIF ANKLE FRACTURE Left 05/16/2021   Procedure: OPEN REDUCTION INTERNAL FIXATION (ORIF) Left ankle lateral malleolus, possible deltoid ligament repair;  Surgeon: Toni Arthurs, MD;  [facility];  Service: Orthopedics;  Laterality: Left;   OSTEOTOMY Right 11/23/2014   mid foot   PARTIAL THYMECTOMY  1986   ? side   TONSILLECTOMY  1954   Patient Active Problem List   Diagnosis Date Noted   Closed low lateral malleolus fracture 05/16/2021   Gout 05/16/2021   Hyperlipemia 05/16/2021   Hypertension 05/16/2021   Type II diabetes mellitus (HCC) 05/16/2021   Spinal stenosis of lumbar region 03/10/2019   Cervical myelopathy (HCC) 03/10/2019   Peripheral neuropathy 02/02/2019   Gait abnormality 02/02/2019   Urinary urgency 02/02/2019   Chronic bilateral low back pain without sciatica 02/02/2019   DOE (dyspnea on exertion) 10/05/2017   Bruit of right carotid artery 10/05/2017   Coronary artery calcification seen on CT scan 10/05/2017   Chest pain 10/04/2017   Aortic atherosclerosis (HCC) 10/04/2017   Charcot foot due to diabetes mellitus (HCC) 11/23/2014    PCP: Jarrett Soho, PA  REFERRING PROVIDER: Delena Serve, PA  REFERRING DIAG: poor balance, weakness, difficulty walking  THERAPY DIAG:  Muscle weakness (generalized)  Unsteady gait  Difficulty in walking, not elsewhere classified  Rationale for Evaluation and Treatment: Rehabilitation  ONSET DATE: 11/03/22  SUBJECTIVE:    SUBJECTIVE STATEMENT: Patient reports another cancel due to her ride has been sick  01/22/23  6. Patient will demonstrate increase in shoulder ROM to 120d for flexion and abduction  Baseline: 85d flexion, 60d abd  Goal status: 115d flexion, 90d abd progressing  01/27/23 03/05/23 act flex 145, abd 120 MET  7. Patient will improve L shoulder strength with MMT 4/5   Baseline: 2+ for flexion and 2- for abduction  Goal status: progressing 01/27/23  03/05/23 3- flex and  abd, ongoing 03/11/23  8. Patient will be able to stand independently without UE support for 10s  Baseline:6s at best  Goal status: ongoing 02/17/23  03/05/23 progressing 3-5 sec, ongoing 03/11/23   PLAN:  PT FREQUENCY: 1-2x/week  PT DURATION: 12 weeks  PLANNED INTERVENTIONS: Therapeutic exercises, Therapeutic activity, Neuromuscular re-education, Balance training, Gait training, Patient/Family education, Self Care, Joint mobilization, and Manual therapy  PLAN FOR NEXT SESSION: need to work on stepping up on the curbs, could replicte in clinic with 4" and 6" step and the leg press or fitter   Jearld Lesch, PT 04/02/2023, 5:49 PM [place] [facility] at [facility] [address] [facility] [address] [place], [place], [address] Phone: [phone]   Fax:  [phone] at [facility] [address] [place], [place], [address] Phone: [phone]   Fax:  [phone]  Patient Details  Name: Maryangel Shonka MRN: [id number] Date of Birth: [birth year] Referring Provider:  Jarrett Soho, PA-C  OUTPATIENT PHYSICAL THERAPY LOWER EXTREMITY TREATMENT      Patient Name: Pamela Snyder MRN: [id number] DOB:11-10-1945, 77 y.o., female Today's Date: 04/02/2023  END OF SESSION:  PT End of Session - 04/02/23 1748     Visit Number 32    Date for PT Re-Evaluation 04/16/23    Authorization Type Medicare    PT Start Time 1742    PT Stop Time 1833    PT Time Calculation (min) 51 min    Equipment Utilized During Treatment Gait belt    Activity Tolerance Patient tolerated treatment well    Behavior During Therapy WFL for tasks assessed/performed                         Past Medical History:  Diagnosis Date   Anxiety    Arthritis    "right foot; spine; hands" (11/23/2014)   Charcot foot due to diabetes mellitus (HCC)    Depression    GERD (gastroesophageal reflux disease)    Gout    Hyperlipemia    Hypertension    Migraine    hx   Neuropathy    Numbness    Thyroid goiter 1986   Type II diabetes mellitus (HCC)    Past Surgical History:  Procedure Laterality Date   ABDOMINAL HYSTERECTOMY  1999   w/BSO   ACHILLES TENDON LENGTHENING Right 11/23/2014   ACHILLES TENDON LENGTHENING Right 11/23/2014   Procedure: RIGHT ACHILLES PERCUTANEOUS TENDON LENGTHENING;  Surgeon: Toni Arthurs, MD;  [facility];  Service: Orthopedics;  Laterality: Right;   APPENDECTOMY  1963   ARTHRODESIS Right 11/23/2014   mid foot   CARDIAC CATHETERIZATION  08/2004   FOOT ARTHRODESIS Right 11/23/2014   Procedure: RIGHT MID FOOT ARTHRODESIS;  Surgeon: Toni Arthurs, MD;  [facility];  Service: Orthopedics;  Laterality: Right;   METATARSAL OSTEOTOMY Right 11/23/2014   Procedure: RIGHT MID FOOT OSTEOTOMY;  Surgeon: Toni Arthurs, MD;  [facility];  Service: Orthopedics;  Laterality: Right;   ORIF ANKLE FRACTURE Left 05/16/2021   Procedure: OPEN REDUCTION INTERNAL FIXATION (ORIF) Left ankle lateral malleolus, possible deltoid ligament repair;  Surgeon: Toni Arthurs, MD;  [facility];  Service: Orthopedics;  Laterality: Left;   OSTEOTOMY Right 11/23/2014   mid foot   PARTIAL THYMECTOMY  1986   ? side   TONSILLECTOMY  1954   Patient Active Problem List   Diagnosis Date Noted   Closed low lateral malleolus fracture 05/16/2021   Gout 05/16/2021   Hyperlipemia 05/16/2021   Hypertension 05/16/2021   Type II diabetes mellitus (HCC) 05/16/2021   Spinal stenosis of lumbar region 03/10/2019   Cervical myelopathy (HCC) 03/10/2019   Peripheral neuropathy 02/02/2019   Gait abnormality 02/02/2019   Urinary urgency 02/02/2019   Chronic bilateral low back pain without sciatica 02/02/2019   DOE (dyspnea on exertion) 10/05/2017   Bruit of right carotid artery 10/05/2017   Coronary artery calcification seen on CT scan 10/05/2017   Chest pain 10/04/2017   Aortic atherosclerosis (HCC) 10/04/2017   Charcot foot due to diabetes mellitus (HCC) 11/23/2014    PCP: Jarrett Soho, PA  REFERRING PROVIDER: Delena Serve, PA  REFERRING DIAG: poor balance, weakness, difficulty walking  THERAPY DIAG:  Muscle weakness (generalized)  Unsteady gait  Difficulty in walking, not elsewhere classified  Rationale for Evaluation and Treatment: Rehabilitation  ONSET DATE: 11/03/22  SUBJECTIVE:    SUBJECTIVE STATEMENT: Patient reports another cancel due to her ride has been sick

## 2023-04-08 NOTE — Therapy (Signed)
OUTPATIENT PHYSICAL THERAPY LOWER EXTREMITY TREATMENT      Patient Name: Pamela Snyder MRN: [id number] DOB:[birth year], 77 y.o., female Today's Date: 04/09/2023  END OF SESSION:  PT End of Session - 04/09/23 1700     Visit Number 33    Date for PT Re-Evaluation 04/16/23    Authorization Type Medicare    PT Start Time 1700    PT Stop Time 1755    PT Time Calculation (min) 55 min    Equipment Utilized During Treatment Gait belt    Activity Tolerance Patient tolerated treatment well    Behavior During Therapy WFL for tasks assessed/performed                          Past Medical History:  Diagnosis Date   Anxiety    Arthritis    "right foot; spine; hands" (11/23/2014)   Charcot foot due to diabetes mellitus (HCC)    Depression    GERD (gastroesophageal reflux disease)    Gout    Hyperlipemia    Hypertension    Migraine    hx   Neuropathy    Numbness    Thyroid goiter 1986   Type II diabetes mellitus (HCC)    Past Surgical History:  Procedure Laterality Date   ABDOMINAL HYSTERECTOMY  1999   w/BSO   ACHILLES TENDON LENGTHENING Right 11/23/2014   ACHILLES TENDON LENGTHENING Right 11/23/2014   Procedure: RIGHT ACHILLES PERCUTANEOUS TENDON LENGTHENING;  Surgeon: Toni Arthurs, MD;  [facility];  Service: Orthopedics;  Laterality: Right;   APPENDECTOMY  1963   ARTHRODESIS Right 11/23/2014   mid foot   CARDIAC CATHETERIZATION  08/2004   FOOT ARTHRODESIS Right 11/23/2014   Procedure: RIGHT MID FOOT ARTHRODESIS;  Surgeon: Toni Arthurs, MD;  [facility];  Service: Orthopedics;  Laterality: Right;   METATARSAL OSTEOTOMY Right 11/23/2014   Procedure: RIGHT MID FOOT OSTEOTOMY;  Surgeon: Toni Arthurs, MD;  [facility];  Service: Orthopedics;  Laterality: Right;   ORIF ANKLE FRACTURE Left 05/16/2021   Procedure: OPEN REDUCTION INTERNAL FIXATION (ORIF) Left ankle lateral malleolus, possible deltoid ligament repair;  Surgeon: Toni Arthurs, MD;  [facility];  Service: Orthopedics;  Laterality: Left;   OSTEOTOMY Right 11/23/2014   mid foot   PARTIAL THYMECTOMY  1986   ? side   TONSILLECTOMY  1954   Patient Active Problem List   Diagnosis Date Noted   Closed low lateral malleolus fracture 05/16/2021   Gout 05/16/2021   Hyperlipemia 05/16/2021   Hypertension 05/16/2021   Type II diabetes mellitus (HCC) 05/16/2021   Spinal stenosis of lumbar region 03/10/2019   Cervical myelopathy (HCC) 03/10/2019   Peripheral neuropathy 02/02/2019   Gait abnormality 02/02/2019   Urinary urgency 02/02/2019   Chronic bilateral low back pain without sciatica 02/02/2019   DOE (dyspnea on exertion) 10/05/2017   Bruit of right carotid artery 10/05/2017   Coronary artery calcification seen on CT scan 10/05/2017   Chest pain 10/04/2017   Aortic atherosclerosis (HCC) 10/04/2017   Charcot foot due to diabetes mellitus (HCC) 11/23/2014    PCP: Jarrett Soho, PA  REFERRING PROVIDER: Delena Serve, PA  REFERRING DIAG: poor balance, weakness, difficulty walking  THERAPY DIAG:  Muscle weakness (generalized)  Unsteady gait  Difficulty in walking, not elsewhere classified  Rationale for Evaluation and Treatment: Rehabilitation  ONSET DATE: 11/03/22  SUBJECTIVE:    SUBJECTIVE STATEMENT: nothing new  PERTINENT HISTORY: See above PAIN:  Are you  having pain? Yes: NPRS scale: 0/10 Pain location: low back Pain description: tight, stabbing Aggravating factors: being on feet longer, twisting pain pain up to 6/10 Relieving factors: rest pain will go to 0/10  PRECAUTIONS: Fall  WEIGHT BEARING RESTRICTIONS: No  FALLS:  Has patient fallen in last 6 months? No, just unsteady  LIVING ENVIRONMENT: Lives with: alone Lives in: House/apartment Stairs: Yes: Internal: 12 steps; can reach both Has following equipment at home: Walker - 2 wheeled  OCCUPATION: retired  PLOF: uses walker for household mobility, does her cooking and cleaning using a  FWW  PATIENT GOALS: may go to the beach in the fall, attend women's group, feel stronger, more steady, stay living at home independently  NEXT MD VISIT: none scheduled  OBJECTIVE:   DIAGNOSTIC FINDINGS: none  COGNITION: Overall cognitive status: Within functional limits for tasks assessed     SENSATION: Mid shin down very limited sensation light touch can feel some deeper touch  EDEMA:  Very mild at the ankles  MUSCLE LENGTH: Mild tightness in the HS  POSTURE: rounded shoulders and forward head  PALPATION: Tight and tender in the lumbar paraspinals  LOWER EXTREMITY ROM:  Active ROM Right eval Left eval  Hip flexion 80 80  Hip extension    Hip abduction    Hip adduction    Hip internal rotation    Hip external rotation    Knee flexion    Knee extension 0 0  Ankle dorsiflexion 0 0  Ankle plantarflexion    Ankle inversion    Ankle eversion     (Blank rows = not tested)  LOWER EXTREMITY MMT:  MMT Right eval Left eval  Hip flexion 4 4-  Hip extension 4- 4-  Hip abduction 4 4-  Hip adduction    Hip internal rotation    Hip external rotation    Knee flexion 4 4-  Knee extension 4 3+  Ankle dorsiflexion 2 0  Ankle plantarflexion 1 1  Ankle inversion 1 0  Ankle eversion 0 0   (Blank rows = not tested) FUNCTIONAL TESTS:  TUG: 40 seconds with FWW at evaluation walk test with CGA and FWW = 200 feet at evaluation Unable to stand without holding onto something  GAIT: Distance walked: 200 feet Assistive device utilized: Walker - 2 wheeled Level of assistance: SBA to start and then needs CGA Comments: foot drop bilaterally, decreased foot control, some hip drop on the left side, when fatigued poor safety with transfers   TODAY'S TREATMENT:                                                                                                                              DATE:  04/09/23 Walk outside around [address], need minA to navigate curbs  STS  2x10 minA  6" box taps w/RW  3# chest press with WaTE  NuSTep L5 x109mins  Massage gun to low back    04/02/23  Nustep level 5 x 9 minutes 15# leg extension 25# leg curls 5# standing Min A straight arm and row pulls 20# lats 10# shoulder press 20# resisted gait forward with FWW and CGA Seated volley ball for core Side stepping  03/26/23 Nustp level 5 x 9 minutes LEg curls 25# 3x10 Leg extension 15# 3x10 Gait outside with CGA, min A for curb negotiating up and down, has difficulty with walker placement and really struggled requiring assist to step up on the curbs.   3# hip abduction marches  03/11/23 NuStep L5 x62mins  OHP yellow ball 2x10 Shoulder ext and rows green 2x10 Red band ER RUE 2x10 Standing marches 3# 2x10 LAQ 3# 2x10   03/05/23 Nustep L 5 7 min Assessed goals HHA marching fwd and walking backward 10 feet 3 x min A HHA side stepping 10 feet 3 x min A STS HHA 7 x ( trying for 10) working on speed and control 2 sets  6# biceps curls with WaTe 2x10 seated Standing 6# bar flex bar on walker and up 10 min A then chest press 2 sets 5 Red tband shld ext, row and ER 15x Seated flex and abd 2 sets 5   03/03/23 NuStep Side steps over WaTe, holding on to table  6# biceps curls with WaTe 2x10 6# chest press with WaTe 2x5 Leg ext 20# 2x12 HS curls 20# 2x12 20# lats 2x10 10# chest press 2x10 blackTB ext 2x10  02/26/23 Gait outside 1/2 parking [place], rest and then around building to the front, negotiated 3 curbs up and down, needs close CGA to min A at times with this LEg curls 20# Leg extension 20# 15# lats 10# chest press  Black tband abs Side stepping  02/19/23 Walking outside NuStep L5x  3# WaTE curl to push overhead 2x10  Side steps along mat table STS 2x10 20# HS curls 2x10 20# Knee extension 2x10   PATIENT EDUCATION:  Education details: POC Person educated: Patient Education method: Explanation Education comprehension: verbalized  understanding  HOME EXERCISE PROGRAM: TBD  ASSESSMENT:  CLINICAL IMPRESSION: Patient reports that she has not done well recently due to an illness, and then was out of town. Patient reports pain in low back with STS an difficulty as she feels she has gotten weak over the last few weeks.  Tried to do some box taps with one hand on RW but she reports today is not a good day to do balance.    OBJECTIVE IMPAIRMENTS: Abnormal gait, cardiopulmonary status limiting activity, decreased activity tolerance, decreased balance, decreased coordination, decreased endurance, decreased mobility, difficulty walking, decreased ROM, decreased strength, impaired flexibility, impaired sensation, and pain.   REHAB POTENTIAL: Good  CLINICAL DECISION MAKING: Stable/uncomplicated  EVALUATION COMPLEXITY: Low   GOALS: Goals reviewed with patient? Yes  SHORT TERM GOALS: Target date: 12/02/22 Independent with initial HEP Goal status: met 12/04/22  LONG TERM GOALS: Target date: 02/17/23  Independent with advanced HEP or gym program Goal status ongoing 01/15/23  03/05/23 evolving  2.  Decrease TUG time to 24 seconds Goal status:ongoing 12/16/22, 17s MET 01/27/23  3.  Walk 300 feet in 3 minute walk test Goal status: ongoing 12/23/22, MET 01/27/23  4.  Be able to negotiate stairs and curbs with SBA with one at a time and a handrail Goal status: progressing 01/01/23, 01/27/23 progressing able to do on 4" but not 6"  03/05/23 progressing, ongoing 03/11/23  5.  Report able to warm up a meal without sitting Goal status:  12/11/22 progressing, MET 01/22/23  6. Patient will demonstrate increase in shoulder ROM to 120d for flexion and abduction  Baseline: 85d flexion, 60d abd  Goal status: 115d flexion, 90d abd progressing  01/27/23 03/05/23 act flex 145, abd 120 MET  7. Patient will improve L shoulder strength with MMT 4/5   Baseline: 2+ for flexion and 2- for abduction  Goal status: progressing 01/27/23  03/05/23 3- flex  and abd, ongoing 03/11/23  8. Patient will be able to stand independently without UE support for 10s  Baseline:6s at best  Goal status: ongoing 02/17/23  03/05/23 progressing 3-5 sec, ongoing 03/11/23   PLAN:  PT FREQUENCY: 1-2x/week  PT DURATION: 12 weeks  PLANNED INTERVENTIONS: Therapeutic exercises, Therapeutic activity, Neuromuscular re-education, Balance training, Gait training, Patient/Family education, Self Care, Joint mobilization, and Manual therapy  PLAN FOR NEXT SESSION: need to work on stepping up on the curbs, could replicte in clinic with 4" and 6" step and the leg press or fitter   Cassie Freer, PT 04/09/2023, 6:00 PM [facility] at [facility] [address] [facility] [facility] [place], [place], [address] Phone: [phone]   Fax:  [phone] at [facility] [address] [place], [place], [address] Phone: [phone]   Fax:  [phone]  Patient Details  Name: Isela Tiet MRN: [id number] Date of Birth: 1946/05/25 Referring Provider:  Jarrett Soho, PA-C

## 2023-04-09 ENCOUNTER — Ambulatory Visit: Payer: Medicare Other | Attending: Family Medicine

## 2023-04-09 DIAGNOSIS — R262 Difficulty in walking, not elsewhere classified: Secondary | ICD-10-CM | POA: Diagnosis not present

## 2023-04-09 DIAGNOSIS — M6281 Muscle weakness (generalized): Secondary | ICD-10-CM | POA: Insufficient documentation

## 2023-04-09 DIAGNOSIS — R2681 Unsteadiness on feet: Secondary | ICD-10-CM | POA: Diagnosis not present

## 2023-04-16 ENCOUNTER — Ambulatory Visit: Payer: Medicare Other

## 2023-04-20 ENCOUNTER — Ambulatory Visit: Payer: Medicare Other | Admitting: Physical Therapy

## 2023-04-20 ENCOUNTER — Encounter: Payer: Self-pay | Admitting: Physical Therapy

## 2023-04-20 DIAGNOSIS — R262 Difficulty in walking, not elsewhere classified: Secondary | ICD-10-CM | POA: Diagnosis not present

## 2023-04-20 DIAGNOSIS — M6281 Muscle weakness (generalized): Secondary | ICD-10-CM

## 2023-04-20 DIAGNOSIS — R2681 Unsteadiness on feet: Secondary | ICD-10-CM

## 2023-04-20 NOTE — Therapy (Signed)
OUTPATIENT PHYSICAL THERAPY LOWER EXTREMITY TREATMENT     PHYSICAL THERAPY DISCHARGE SUMMARY  Visits from Start of Care: 34  Current functional level related to goals / functional outcomes: Patient has progressed well, met most goals.   Remaining deficits: Decreased balance, decreased strength   Education / Equipment: HEP   Patient agrees to discharge. Patient goals were partially met. Patient is being discharged due to maximized rehab potential.    Patient Name: Pamela Snyder MRN: [id number] DOB:13-Mar-1946, 77 y.o., female Today's Date: 04/20/2023  END OF SESSION:  PT End of Session - 04/20/23 1549     Visit Number 34    Date for PT Re-Evaluation 05/11/23    PT Start Time 1546    PT Stop Time 1625    PT Time Calculation (min) 39 min    Activity Tolerance Patient tolerated treatment well    Behavior During Therapy [facility] for tasks assessed/performed            Past Medical History:  Diagnosis Date   Anxiety    Arthritis    "right foot; spine; hands" (11/23/2014)   Charcot foot due to diabetes mellitus (HCC)    Depression    GERD (gastroesophageal reflux disease)    Gout    Hyperlipemia    Hypertension    Migraine    hx   Neuropathy    Numbness    Thyroid goiter 1986   Type II diabetes mellitus (HCC)    Past Surgical History:  Procedure Laterality Date   ABDOMINAL HYSTERECTOMY  1999   w/BSO   ACHILLES TENDON LENGTHENING Right 11/23/2014   ACHILLES TENDON LENGTHENING Right 11/23/2014   Procedure: RIGHT ACHILLES PERCUTANEOUS TENDON LENGTHENING;  Surgeon: Toni Arthurs, MD;  [facility];  Service: Orthopedics;  Laterality: Right;   APPENDECTOMY  1963   ARTHRODESIS Right 11/23/2014   mid foot   CARDIAC CATHETERIZATION  08/2004   FOOT ARTHRODESIS Right 11/23/2014   Procedure: RIGHT MID FOOT ARTHRODESIS;  Surgeon: Toni Arthurs, MD;  Location: [facility];  Service: Orthopedics;  Laterality: Right;   METATARSAL OSTEOTOMY Right 11/23/2014   Procedure: RIGHT MID  FOOT OSTEOTOMY;  Surgeon: Toni Arthurs, MD;  [facility];  Service: Orthopedics;  Laterality: Right;   ORIF ANKLE FRACTURE Left 05/16/2021   Procedure: OPEN REDUCTION INTERNAL FIXATION (ORIF) Left ankle lateral malleolus, possible deltoid ligament repair;  Surgeon: Toni Arthurs, MD;  [facility];  Service: Orthopedics;  Laterality: Left;   OSTEOTOMY Right 11/23/2014   mid foot   PARTIAL THYMECTOMY  1986   ? side   TONSILLECTOMY  1954   Patient Active Problem List   Diagnosis Date Noted   Closed low lateral malleolus fracture 05/16/2021   Gout 05/16/2021   Hyperlipemia 05/16/2021   Hypertension 05/16/2021   Type II diabetes mellitus (HCC) 05/16/2021   Spinal stenosis of lumbar region 03/10/2019   Cervical myelopathy (HCC) 03/10/2019   Peripheral neuropathy 02/02/2019   Gait abnormality 02/02/2019   Urinary urgency 02/02/2019   Chronic bilateral low back pain without sciatica 02/02/2019   DOE (dyspnea on exertion) 10/05/2017   Bruit of right carotid artery 10/05/2017   Coronary artery calcification seen on CT scan 10/05/2017   Chest pain 10/04/2017   Aortic atherosclerosis (HCC) 10/04/2017   Charcot foot due to diabetes mellitus (HCC) 11/23/2014    PCP: Jarrett Soho, PA  REFERRING PROVIDER: Delena Serve, PA  REFERRING DIAG: poor balance, weakness, difficulty walking  THERAPY DIAG:  Muscle weakness (generalized)  Unsteady gait  OUTPATIENT PHYSICAL THERAPY LOWER EXTREMITY TREATMENT     PHYSICAL THERAPY DISCHARGE SUMMARY  Visits from Start of Care: 34  Current functional level related to goals / functional outcomes: Patient has progressed well, met most goals.   Remaining deficits: Decreased balance, decreased strength   Education / Equipment: HEP   Patient agrees to discharge. Patient goals were partially met. Patient is being discharged due to maximized rehab potential.    Patient Name: Pamela Snyder MRN: [id number] DOB:13-Mar-1946, 77 y.o., female Today's Date: 04/20/2023  END OF SESSION:  PT End of Session - 04/20/23 1549     Visit Number 34    Date for PT Re-Evaluation 05/11/23    PT Start Time 1546    PT Stop Time 1625    PT Time Calculation (min) 39 min    Activity Tolerance Patient tolerated treatment well    Behavior During Therapy [facility] for tasks assessed/performed            Past Medical History:  Diagnosis Date   Anxiety    Arthritis    "right foot; spine; hands" (11/23/2014)   Charcot foot due to diabetes mellitus (HCC)    Depression    GERD (gastroesophageal reflux disease)    Gout    Hyperlipemia    Hypertension    Migraine    hx   Neuropathy    Numbness    Thyroid goiter 1986   Type II diabetes mellitus (HCC)    Past Surgical History:  Procedure Laterality Date   ABDOMINAL HYSTERECTOMY  1999   w/BSO   ACHILLES TENDON LENGTHENING Right 11/23/2014   ACHILLES TENDON LENGTHENING Right 11/23/2014   Procedure: RIGHT ACHILLES PERCUTANEOUS TENDON LENGTHENING;  Surgeon: Toni Arthurs, MD;  [facility];  Service: Orthopedics;  Laterality: Right;   APPENDECTOMY  1963   ARTHRODESIS Right 11/23/2014   mid foot   CARDIAC CATHETERIZATION  08/2004   FOOT ARTHRODESIS Right 11/23/2014   Procedure: RIGHT MID FOOT ARTHRODESIS;  Surgeon: Toni Arthurs, MD;  [facility];  Service: Orthopedics;  Laterality: Right;   METATARSAL OSTEOTOMY Right 11/23/2014   Procedure: RIGHT MID  FOOT OSTEOTOMY;  Surgeon: Toni Arthurs, MD;  [facility];  Service: Orthopedics;  Laterality: Right;   ORIF ANKLE FRACTURE Left 05/16/2021   Procedure: OPEN REDUCTION INTERNAL FIXATION (ORIF) Left ankle lateral malleolus, possible deltoid ligament repair;  Surgeon: Toni Arthurs, MD;  [facility];  Service: Orthopedics;  Laterality: Left;   OSTEOTOMY Right 11/23/2014   mid foot   PARTIAL THYMECTOMY  1986   ? side   TONSILLECTOMY  1954   Patient Active Problem List   Diagnosis Date Noted   Closed low lateral malleolus fracture 05/16/2021   Gout 05/16/2021   Hyperlipemia 05/16/2021   Hypertension 05/16/2021   Type II diabetes mellitus (HCC) 05/16/2021   Spinal stenosis of lumbar region 03/10/2019   Cervical myelopathy (HCC) 03/10/2019   Peripheral neuropathy 02/02/2019   Gait abnormality 02/02/2019   Urinary urgency 02/02/2019   Chronic bilateral low back pain without sciatica 02/02/2019   DOE (dyspnea on exertion) 10/05/2017   Bruit of right carotid artery 10/05/2017   Coronary artery calcification seen on CT scan 10/05/2017   Chest pain 10/04/2017   Aortic atherosclerosis (HCC) 10/04/2017   Charcot foot due to diabetes mellitus (HCC) 11/23/2014    PCP: Jarrett Soho, PA  REFERRING PROVIDER: Delena Serve, PA  REFERRING DIAG: poor balance, weakness, difficulty walking  THERAPY DIAG:  Muscle weakness (generalized)  Unsteady gait  curbs with SBA with one at a time and a handrail Goal status: progressing 01/01/23, 01/27/23 progressing able to do on 4" but not 6"  03/05/23 progressing, ongoing , 04/20/23- Can climb a step or 2  with rail. met  5.  Report able to warm up a meal without sitting Goal status: 12/11/22 progressing, MET 01/22/23  6. Patient will demonstrate increase in shoulder ROM to 120d for flexion and abduction  Baseline: 85d flexion, 60d abd  Goal status: 115d flexion, 90d abd progressing  01/27/23 03/05/23 act flex 145, abd 120 MET  7. Patient will improve L shoulder strength with MMT 4/5   Baseline: 2+ for flexion and 2- for abduction  Goal status: progressing 01/27/23  03/05/23 3- flex and abd, ongoing 03/11/23, 3+/5 not met  8. Patient will be able to stand independently without UE support for 10s  Baseline:6s at best  Goal status: ongoing 02/17/23  03/05/23 progressing 3-5 sec, ongoing , 04/20/23-2-3 sec, not met   PLAN:  PT FREQUENCY: 1-2x/week  PT DURATION: 12 weeks  PLANNED INTERVENTIONS: Therapeutic exercises, Therapeutic activity, Neuromuscular re-education, Balance training, Gait training, Patient/Family education, Self Care, Joint mobilization, and Manual therapy  PLAN FOR NEXT SESSION: need to work on stepping up on the curbs, could replicte in clinic with 4" and 6" step and the leg press or fitter   Iona Beard, DPT 04/20/2023, 4:32 PM [facility] [place], [place], [address] Phone: [phone]   Fax:  [phone] [address] [place], [place], [address] Phone: [phone]   Fax:  [phone]  Patient Details  Name: Pamela Snyder MRN: [id number] Date of Birth: 1946-06-25 Referring Provider:  Jarrett Soho, PA-C  curbs with SBA with one at a time and a handrail Goal status: progressing 01/01/23, 01/27/23 progressing able to do on 4" but not 6"  03/05/23 progressing, ongoing , 04/20/23- Can climb a step or 2  with rail. met  5.  Report able to warm up a meal without sitting Goal status: 12/11/22 progressing, MET 01/22/23  6. Patient will demonstrate increase in shoulder ROM to 120d for flexion and abduction  Baseline: 85d flexion, 60d abd  Goal status: 115d flexion, 90d abd progressing  01/27/23 03/05/23 act flex 145, abd 120 MET  7. Patient will improve L shoulder strength with MMT 4/5   Baseline: 2+ for flexion and 2- for abduction  Goal status: progressing 01/27/23  03/05/23 3- flex and abd, ongoing 03/11/23, 3+/5 not met  8. Patient will be able to stand independently without UE support for 10s  Baseline:6s at best  Goal status: ongoing 02/17/23  03/05/23 progressing 3-5 sec, ongoing , 04/20/23-2-3 sec, not met   PLAN:  PT FREQUENCY: 1-2x/week  PT DURATION: 12 weeks  PLANNED INTERVENTIONS: Therapeutic exercises, Therapeutic activity, Neuromuscular re-education, Balance training, Gait training, Patient/Family education, Self Care, Joint mobilization, and Manual therapy  PLAN FOR NEXT SESSION: need to work on stepping up on the curbs, could replicte in clinic with 4" and 6" step and the leg press or fitter   Iona Beard, DPT 04/20/2023, 4:32 PM [facility] [place], [place], [address] Phone: [phone]   Fax:  [phone] [address] [place], [place], [address] Phone: [phone]   Fax:  [phone]  Patient Details  Name: Pamela Snyder MRN: [id number] Date of Birth: 1946-06-25 Referring Provider:  Jarrett Soho, PA-C

## 2023-04-23 ENCOUNTER — Ambulatory Visit: Payer: Medicare Other | Admitting: Physical Therapy

## 2023-04-27 ENCOUNTER — Ambulatory Visit: Payer: Medicare Other | Admitting: Physical Therapy

## 2023-04-30 ENCOUNTER — Ambulatory Visit: Payer: Medicare Other | Admitting: Physical Therapy

## 2023-05-04 ENCOUNTER — Ambulatory Visit: Payer: Medicare Other | Admitting: Physical Therapy

## 2023-05-05 DIAGNOSIS — E1143 Type 2 diabetes mellitus with diabetic autonomic (poly)neuropathy: Secondary | ICD-10-CM | POA: Diagnosis not present

## 2023-05-05 DIAGNOSIS — H04203 Unspecified epiphora, bilateral lacrimal glands: Secondary | ICD-10-CM | POA: Diagnosis not present

## 2023-05-05 DIAGNOSIS — I10 Essential (primary) hypertension: Secondary | ICD-10-CM | POA: Diagnosis not present

## 2023-05-05 DIAGNOSIS — Z23 Encounter for immunization: Secondary | ICD-10-CM | POA: Diagnosis not present

## 2023-05-05 DIAGNOSIS — E7800 Pure hypercholesterolemia, unspecified: Secondary | ICD-10-CM | POA: Diagnosis not present

## 2023-05-05 DIAGNOSIS — K219 Gastro-esophageal reflux disease without esophagitis: Secondary | ICD-10-CM | POA: Diagnosis not present

## 2023-05-05 DIAGNOSIS — E559 Vitamin D deficiency, unspecified: Secondary | ICD-10-CM | POA: Diagnosis not present

## 2023-05-05 DIAGNOSIS — M810 Age-related osteoporosis without current pathological fracture: Secondary | ICD-10-CM | POA: Diagnosis not present

## 2023-05-05 DIAGNOSIS — Z8739 Personal history of other diseases of the musculoskeletal system and connective tissue: Secondary | ICD-10-CM | POA: Diagnosis not present

## 2023-05-05 DIAGNOSIS — I2510 Atherosclerotic heart disease of native coronary artery without angina pectoris: Secondary | ICD-10-CM | POA: Diagnosis not present

## 2023-05-05 DIAGNOSIS — N183 Chronic kidney disease, stage 3 unspecified: Secondary | ICD-10-CM | POA: Diagnosis not present

## 2023-05-07 ENCOUNTER — Ambulatory Visit: Payer: Medicare Other

## 2023-06-08 DIAGNOSIS — H10413 Chronic giant papillary conjunctivitis, bilateral: Secondary | ICD-10-CM | POA: Diagnosis not present

## 2023-09-07 DIAGNOSIS — M810 Age-related osteoporosis without current pathological fracture: Secondary | ICD-10-CM | POA: Diagnosis not present

## 2023-09-21 DIAGNOSIS — R2689 Other abnormalities of gait and mobility: Secondary | ICD-10-CM | POA: Diagnosis not present

## 2023-10-12 ENCOUNTER — Ambulatory Visit: Attending: Neurology | Admitting: Physical Therapy

## 2023-10-12 ENCOUNTER — Encounter: Payer: Self-pay | Admitting: Physical Therapy

## 2023-10-12 DIAGNOSIS — R262 Difficulty in walking, not elsewhere classified: Secondary | ICD-10-CM | POA: Insufficient documentation

## 2023-10-12 DIAGNOSIS — R2681 Unsteadiness on feet: Secondary | ICD-10-CM | POA: Insufficient documentation

## 2023-10-12 DIAGNOSIS — M6281 Muscle weakness (generalized): Secondary | ICD-10-CM | POA: Diagnosis not present

## 2023-10-12 NOTE — Therapy (Signed)
 OUTPATIENT PHYSICAL THERAPY LOWER EXTREMITY EVALUATION   Patient Name: Pamela Snyder MRN: [id number] DOB:Oct 05, 1945, 78 y.o., female Today's Date: 10/12/2023  END OF SESSION:  PT End of Session - 10/12/23 1319     Visit Number 1    Date for PT Re-Evaluation 01/11/24    Authorization Type Medicare    PT Start Time 1315    PT Stop Time 1400    PT Time Calculation (min) 45 min    Activity Tolerance Patient tolerated treatment well    Behavior During Therapy WFL for tasks assessed/performed              Past Medical History:  Diagnosis Date   Anxiety    Arthritis    "right foot; spine; hands" (11/23/2014)   Charcot foot due to diabetes mellitus (HCC)    Depression    GERD (gastroesophageal reflux disease)    Gout    Hyperlipemia    Hypertension    Migraine    hx   Neuropathy    Numbness    Thyroid goiter 1986   Type II diabetes mellitus (HCC)    Past Surgical History:  Procedure Laterality Date   ABDOMINAL HYSTERECTOMY  1999   w/BSO   ACHILLES TENDON LENGTHENING Right 11/23/2014   ACHILLES TENDON LENGTHENING Right 11/23/2014   Procedure: RIGHT ACHILLES PERCUTANEOUS TENDON LENGTHENING;  Surgeon: Toni Arthurs, MD;  [facility];  Service: Orthopedics;  Laterality: Right;   APPENDECTOMY  1963   ARTHRODESIS Right 11/23/2014   mid foot   CARDIAC CATHETERIZATION  08/2004   FOOT ARTHRODESIS Right 11/23/2014   Procedure: RIGHT MID FOOT ARTHRODESIS;  Surgeon: Toni Arthurs, MD;  [facility]: [facility];  Service: Orthopedics;  Laterality: Right;   METATARSAL OSTEOTOMY Right 11/23/2014   Procedure: RIGHT MID FOOT OSTEOTOMY;  Surgeon: Toni Arthurs, MD;  [facility]: [facility];  Service: Orthopedics;  Laterality: Right;   ORIF ANKLE FRACTURE Left 05/16/2021   Procedure: OPEN REDUCTION INTERNAL FIXATION (ORIF) Left ankle lateral malleolus, possible deltoid ligament repair;  Surgeon: Toni Arthurs, MD;  [facility]: [facility];  Service: Orthopedics;  Laterality: Left;   OSTEOTOMY Right 11/23/2014    mid foot   PARTIAL THYMECTOMY  1986   ? side   TONSILLECTOMY  1954   Patient Active Problem List   Diagnosis Date Noted   Closed low lateral malleolus fracture 05/16/2021   Gout 05/16/2021   Hyperlipemia 05/16/2021   Hypertension 05/16/2021   Type II diabetes mellitus (HCC) 05/16/2021   Spinal stenosis of lumbar region 03/10/2019   Cervical myelopathy (HCC) 03/10/2019   Peripheral neuropathy 02/02/2019   Gait abnormality 02/02/2019   Urinary urgency 02/02/2019   Chronic bilateral low back pain without sciatica 02/02/2019   DOE (dyspnea on exertion) 10/05/2017   Bruit of right carotid artery 10/05/2017   Coronary artery calcification seen on CT scan 10/05/2017   Chest pain 10/04/2017   Aortic atherosclerosis (HCC) 10/04/2017   Charcot foot due to diabetes mellitus (HCC) 11/23/2014    PCP: Jarrett Soho, PA  REFERRING PROVIDER: Delena Serve, PA  REFERRING DIAG: poor balance, weakness, difficulty walking  THERAPY DIAG:  Muscle weakness (generalized)  Unsteady gait  Difficulty in walking, not elsewhere classified  Rationale for Evaluation and Treatment: Rehabilitation  ONSET DATE: 09/22/23  SUBJECTIVE:   SUBJECTIVE STATEMENT: Had a fall in November 2022 and sustained a left Malleolar fracture with ORIF.  She was suffering from neuropathy, had just started to use a walker but prior to that did not use a device.  We saw her April 2024 and she progressed well with walking, strength and function.  She returns today due to weakness, difficulty walking, decreased endurance, and unsteady gait.  Denies any recent falls.  She reports that she is walking much slower and is a little more unsteady, feels weak, reports that she is taking Lyrica and Cymbalta.  PERTINENT HISTORY: See above PAIN:  Are you having pain? Yes: NPRS scale: 3/10 Pain location: low back Pain description: tight, stabbing Aggravating factors: being on feet longer, twisting pain pain up to 6/10 Relieving  factors: rest pain will go to 0/10  PRECAUTIONS: Fall  WEIGHT BEARING RESTRICTIONS: No  FALLS:  Has patient fallen in last 6 months? No, just unsteady  LIVING ENVIRONMENT: Lives with: lives alone Lives in: House/apartment Stairs: Yes: Internal: 12 steps; can reach both Has following equipment at home: Dan Humphreys - 2 wheeled, Tour manager, and Grab bars  OCCUPATION: retired  PLOF: Independent with household mobility with device and Needs assistance with homemaking has two daughters that check on her every other day, she does some cooking, does some housework  PATIENT GOALS: may go to the beach in the fall, stay living at home independently  NEXT MD VISIT: none scheduled  OBJECTIVE:   DIAGNOSTIC FINDINGS: none  COGNITION: Overall cognitive status: Within functional limits for tasks assessed     SENSATION: Mid shin down very limited sensation light touch can feel some deeper touch  EDEMA:  Very mild at the ankles  MUSCLE LENGTH: Mild tightness in the HS  POSTURE: rounded shoulders and forward head  PALPATION: Tight and tender in the lumbar paraspinals  LOWER EXTREMITY ROM:  Active ROM Right eval Left eval  Hip flexion 80 80  Hip extension    Hip abduction    Hip adduction    Hip internal rotation    Hip external rotation    Knee flexion    Knee extension 0 0  Ankle dorsiflexion 0 0  Ankle plantarflexion    Ankle inversion    Ankle eversion     (Blank rows = not tested)  LOWER EXTREMITY MMT:  MMT Right eval Left eval  Hip flexion 4 4-  Hip extension 4- 4-  Hip abduction 4- 4-  Hip adduction    Hip internal rotation    Hip external rotation    Knee flexion 4- 4-  Knee extension 4 34  Ankle dorsiflexion 1+ 0  Ankle plantarflexion 1 1  Ankle inversion 1 0  Ankle eversion 0 0   (Blank rows = not tested) FUNCTIONAL TESTS:  TUG: 37 seconds with FWW at evaluation walk test with CGA and FWW = 158 feet at evaluation, had to stop due to  fatigue Unable to stand without holding onto something  GAIT: Distance walked: 158 feet Assistive device utilized: Walker - 2 wheeled Level of assistance: SBA to start and then needs CGA Comments: foot drop bilaterally, decreased foot control, some hip drop on the left side, when fatigued poor safety with transfers   TODAY'S TREATMENT:  DATE:     PATIENT EDUCATION:  Education details: POC Person educated: Patient Education method: Explanation Education comprehension: verbalized understanding  HOME EXERCISE PROGRAM: TBD  ASSESSMENT:  CLINICAL IMPRESSION: Patient is a 78 y.o. female who was seen today for physical therapy evaluation and treatment for weakness, unsteady gait, poor balance.  She has minimal movements at the ankles and minimal sensation to the ankles and lower legs.  She was very slow with the TUG and slow with the 3 minute walk test.  Due to the neuropathy her balance is very poor, cannot stand without holding onto something, is starting to have issues with her hands, she had not had any falls but is very careful  OBJECTIVE IMPAIRMENTS: Abnormal gait, cardiopulmonary status limiting activity, decreased activity tolerance, decreased balance, decreased coordination, decreased endurance, decreased mobility, difficulty walking, decreased ROM, decreased strength, impaired flexibility, impaired sensation, and pain.   REHAB POTENTIAL: Good  CLINICAL DECISION MAKING: Stable/uncomplicated  EVALUATION COMPLEXITY: Low   GOALS: Goals reviewed with patient? Yes  SHORT TERM GOALS: Target date: 11/11/23 Independent with initial HEP Goal status: INITIAL  LONG TERM GOALS: Target date: 02/11/24  Independent with advanced HEP or gym program Goal status: INITIAL  2.  Decrease TUG time to 24 seconds Goal status: INITIAL  3.  Walk 300 feet in 3 minute walk  test Goal status: INITIAL  4.  Be able to negotiate stairs and curbs with SBA with one at a time and a handrail Goal status: INITIAL  5.  Report able to warm up a meal without sitting Goal status: INITIAL   PLAN:  PT FREQUENCY: 1-2x/week  PT DURATION: 12 weeks  PLANNED INTERVENTIONS: Therapeutic exercises, Therapeutic activity, Neuromuscular re-education, Balance training, Gait training, Patient/Family education, Self Care, Joint mobilization, and Manual therapy  PLAN FOR NEXT SESSION: add exercises, balance, HEP   Thamara Leger W, PT 10/12/2023, 1:19 PM

## 2023-10-20 DIAGNOSIS — I2510 Atherosclerotic heart disease of native coronary artery without angina pectoris: Secondary | ICD-10-CM | POA: Diagnosis not present

## 2023-10-20 DIAGNOSIS — E1143 Type 2 diabetes mellitus with diabetic autonomic (poly)neuropathy: Secondary | ICD-10-CM | POA: Diagnosis not present

## 2023-10-20 DIAGNOSIS — N183 Chronic kidney disease, stage 3 unspecified: Secondary | ICD-10-CM | POA: Diagnosis not present

## 2023-10-20 DIAGNOSIS — I10 Essential (primary) hypertension: Secondary | ICD-10-CM | POA: Diagnosis not present

## 2023-11-02 ENCOUNTER — Encounter: Payer: Self-pay | Admitting: Physical Therapy

## 2023-11-02 ENCOUNTER — Ambulatory Visit: Admitting: Physical Therapy

## 2023-11-02 DIAGNOSIS — E559 Vitamin D deficiency, unspecified: Secondary | ICD-10-CM | POA: Diagnosis not present

## 2023-11-02 DIAGNOSIS — E1143 Type 2 diabetes mellitus with diabetic autonomic (poly)neuropathy: Secondary | ICD-10-CM | POA: Diagnosis not present

## 2023-11-02 DIAGNOSIS — M810 Age-related osteoporosis without current pathological fracture: Secondary | ICD-10-CM | POA: Diagnosis not present

## 2023-11-02 DIAGNOSIS — R2681 Unsteadiness on feet: Secondary | ICD-10-CM

## 2023-11-02 DIAGNOSIS — Z6834 Body mass index (BMI) 34.0-34.9, adult: Secondary | ICD-10-CM | POA: Diagnosis not present

## 2023-11-02 DIAGNOSIS — M6281 Muscle weakness (generalized): Secondary | ICD-10-CM | POA: Diagnosis not present

## 2023-11-02 DIAGNOSIS — T23229A Burn of second degree of unspecified single finger (nail) except thumb, initial encounter: Secondary | ICD-10-CM | POA: Diagnosis not present

## 2023-11-02 DIAGNOSIS — R262 Difficulty in walking, not elsewhere classified: Secondary | ICD-10-CM | POA: Diagnosis not present

## 2023-11-02 DIAGNOSIS — E1122 Type 2 diabetes mellitus with diabetic chronic kidney disease: Secondary | ICD-10-CM | POA: Diagnosis not present

## 2023-11-02 DIAGNOSIS — I2510 Atherosclerotic heart disease of native coronary artery without angina pectoris: Secondary | ICD-10-CM | POA: Diagnosis not present

## 2023-11-02 DIAGNOSIS — R911 Solitary pulmonary nodule: Secondary | ICD-10-CM | POA: Diagnosis not present

## 2023-11-02 DIAGNOSIS — N183 Chronic kidney disease, stage 3 unspecified: Secondary | ICD-10-CM | POA: Diagnosis not present

## 2023-11-02 DIAGNOSIS — I10 Essential (primary) hypertension: Secondary | ICD-10-CM | POA: Diagnosis not present

## 2023-11-02 DIAGNOSIS — M150 Primary generalized (osteo)arthritis: Secondary | ICD-10-CM | POA: Diagnosis not present

## 2023-11-02 NOTE — Therapy (Signed)
 OUTPATIENT PHYSICAL THERAPY LOWER EXTREMITY EVALUATION   Patient Name: Pamela Snyder MRN: [id number] DOB:May 20, 1946, 78 y.o., female Today's Date: 11/02/2023  END OF SESSION:  PT End of Session - 11/02/23 1619     Visit Number 2    Date for PT Re-Evaluation 01/11/24    Authorization Type Medicare    PT Start Time 1615    PT Stop Time 1700    PT Time Calculation (min) 45 min    Activity Tolerance Patient tolerated treatment well    Behavior During Therapy WFL for tasks assessed/performed              Past Medical History:  Diagnosis Date   Anxiety    Arthritis    "right foot; spine; hands" (11/23/2014)   Charcot foot due to diabetes mellitus (HCC)    Depression    GERD (gastroesophageal reflux disease)    Gout    Hyperlipemia    Hypertension    Migraine    hx   Neuropathy    Numbness    Thyroid  goiter 1986   Type II diabetes mellitus (HCC)    Past Surgical History:  Procedure Laterality Date   ABDOMINAL HYSTERECTOMY  1999   w/BSO   ACHILLES TENDON LENGTHENING Right 11/23/2014   ACHILLES TENDON LENGTHENING Right 11/23/2014   Procedure: RIGHT ACHILLES PERCUTANEOUS TENDON LENGTHENING;  Surgeon: Amada Backer, MD;  Location: [facility];  Service: Orthopedics;  Laterality: Right;   APPENDECTOMY  1963   ARTHRODESIS Right 11/23/2014   mid foot   CARDIAC CATHETERIZATION  08/2004   FOOT ARTHRODESIS Right 11/23/2014   Procedure: RIGHT MID FOOT ARTHRODESIS;  Surgeon: Amada Backer, MD;  Location: [facility];  Service: Orthopedics;  Laterality: Right;   METATARSAL OSTEOTOMY Right 11/23/2014   Procedure: RIGHT MID FOOT OSTEOTOMY;  Surgeon: Amada Backer, MD;  Location: [facility];  Service: Orthopedics;  Laterality: Right;   ORIF ANKLE FRACTURE Left 05/16/2021   Procedure: OPEN REDUCTION INTERNAL FIXATION (ORIF) Left ankle lateral malleolus, possible deltoid ligament repair;  Surgeon: Amada Backer, MD;  Location: [facility];  Service: Orthopedics;  Laterality: Left;   OSTEOTOMY Right 11/23/2014    mid foot   PARTIAL THYMECTOMY  1986   ? side   TONSILLECTOMY  1954   Patient Active Problem List   Diagnosis Date Noted   Closed low lateral malleolus fracture 05/16/2021   Gout 05/16/2021   Hyperlipemia 05/16/2021   Hypertension 05/16/2021   Type II diabetes mellitus (HCC) 05/16/2021   Spinal stenosis of lumbar region 03/10/2019   Cervical myelopathy (HCC) 03/10/2019   Peripheral neuropathy 02/02/2019   Gait abnormality 02/02/2019   Urinary urgency 02/02/2019   Chronic bilateral low back pain without sciatica 02/02/2019   DOE (dyspnea on exertion) 10/05/2017   Bruit of right carotid artery 10/05/2017   Coronary artery calcification seen on CT scan 10/05/2017   Chest pain 10/04/2017   Aortic atherosclerosis (HCC) 10/04/2017   Charcot foot due to diabetes mellitus (HCC) 11/23/2014    PCP: Darnelle Elders, PA  REFERRING PROVIDER: Clotilde Danker, PA  REFERRING DIAG: poor balance, weakness, difficulty walking  THERAPY DIAG:  Muscle weakness (generalized)  Unsteady gait  Difficulty in walking, not elsewhere classified  Rationale for Evaluation and Treatment: Rehabilitation  ONSET DATE: 09/22/23  SUBJECTIVE:   SUBJECTIVE STATEMENT: Patient reports that she has had some wobbles, no falls.    Had a fall in November 2022 and sustained a left Malleolar fracture with ORIF.  She was suffering from neuropathy, had just started  to use a walker but prior to that did not use a device.  We saw her April 2024 and she progressed well with walking, strength and function.  She returns today due to weakness, difficulty walking, decreased endurance, and unsteady gait.  Denies any recent falls.  She reports that she is walking much slower and is a little more unsteady, feels weak, reports that she is taking Lyrica  and Cymbalta.  PERTINENT HISTORY: See above PAIN:  Are you having pain? Yes: NPRS scale: 3/10 Pain location: low back Pain description: tight, stabbing Aggravating factors: being  on feet longer, twisting pain pain up to 6/10 Relieving factors: rest pain will go to 0/10  PRECAUTIONS: Fall  WEIGHT BEARING RESTRICTIONS: No  FALLS:  Has patient fallen in last 6 months? No, just unsteady  LIVING ENVIRONMENT: Lives with: lives alone Lives in: House/apartment Stairs: Yes: Internal: 12 steps; can reach both Has following equipment at home: Otho Blitz - 2 wheeled, Tour manager, and Grab bars  OCCUPATION: retired  PLOF: Independent with household mobility with device and Needs assistance with homemaking has two daughters that check on her every other day, she does some cooking, does some housework  PATIENT GOALS: may go to the beach in the fall, stay living at home independently  NEXT MD VISIT: none scheduled  OBJECTIVE:   DIAGNOSTIC FINDINGS: none  COGNITION: Overall cognitive status: Within functional limits for tasks assessed     SENSATION: Mid shin down very limited sensation light touch can feel some deeper touch  EDEMA:  Very mild at the ankles  MUSCLE LENGTH: Mild tightness in the HS  POSTURE: rounded shoulders and forward head  PALPATION: Tight and tender in the lumbar paraspinals  LOWER EXTREMITY ROM:  Active ROM Right eval Left eval  Hip flexion 80 80  Hip extension    Hip abduction    Hip adduction    Hip internal rotation    Hip external rotation    Knee flexion    Knee extension 0 0  Ankle dorsiflexion 0 0  Ankle plantarflexion    Ankle inversion    Ankle eversion     (Blank rows = not tested)  LOWER EXTREMITY MMT:  MMT Right eval Left eval  Hip flexion 4 4-  Hip extension 4- 4-  Hip abduction 4- 4-  Hip adduction    Hip internal rotation    Hip external rotation    Knee flexion 4- 4-  Knee extension 4 34  Ankle dorsiflexion 1+ 0  Ankle plantarflexion 1 1  Ankle inversion 1 0  Ankle eversion 0 0   (Blank rows = not tested) FUNCTIONAL TESTS:  TUG: 37 seconds with FWW at evaluation walk test with CGA and  FWW = 158 feet at evaluation, had to stop due to fatigue Unable to stand without holding onto something  GAIT: Distance walked: 158 feet Assistive device utilized: Walker - 2 wheeled Level of assistance: SBA to start and then needs CGA Comments: foot drop bilaterally, decreased foot control, some hip drop on the left side, when fatigued poor safety with transfers   TODAY'S TREATMENT:  DATE:   11/02/23 Nustep level 5 x 6 minutes Gait around half the back parking [place] with one sitting rest break 4" step ups with CGA Seated volley ball  PATIENT EDUCATION:  Education details: POC Person educated: Patient Education method: Explanation Education comprehension: verbalized understanding  HOME EXERCISE PROGRAM: TBD  ASSESSMENT:  CLINICAL IMPRESSION: Patient is a 78 y.o. female who was seen today for physical therapy evaluation and treatment for weakness, unsteady gait, poor balance.  She has minimal movements at the ankles and minimal sensation to the ankles and lower legs.  She was very slow with the TUG and slow with the 3 minute walk test.  Due to the neuropathy her balance is very poor, cannot stand without holding onto something, is starting to have issues with her hands, she had not had any falls but is very careful  OBJECTIVE IMPAIRMENTS: Abnormal gait, cardiopulmonary status limiting activity, decreased activity tolerance, decreased balance, decreased coordination, decreased endurance, decreased mobility, difficulty walking, decreased ROM, decreased strength, impaired flexibility, impaired sensation, and pain.   REHAB POTENTIAL: Good  CLINICAL DECISION MAKING: Stable/uncomplicated  EVALUATION COMPLEXITY: Low   GOALS: Goals reviewed with patient? Yes  SHORT TERM GOALS: Target date: 11/11/23 Independent with initial HEP Goal status: INITIAL  LONG TERM  GOALS: Target date: 02/11/24  Independent with advanced HEP or gym program Goal status: INITIAL  2.  Decrease TUG time to 24 seconds Goal status: INITIAL  3.  Walk 300 feet in 3 minute walk test Goal status: INITIAL  4.  Be able to negotiate stairs and curbs with SBA with one at a time and a handrail Goal status: INITIAL  5.  Report able to warm up a meal without sitting Goal status: INITIAL   PLAN:  PT FREQUENCY: 1-2x/week  PT DURATION: 12 weeks  PLANNED INTERVENTIONS: Therapeutic exercises, Therapeutic activity, Neuromuscular re-education, Balance training, Gait training, Patient/Family education, Self Care, Joint mobilization, and Manual therapy  PLAN FOR NEXT SESSION: add exercises, balance, HEP   Aleena Kirkeby W, PT 11/02/2023, 4:20 PM

## 2023-11-04 DIAGNOSIS — E7800 Pure hypercholesterolemia, unspecified: Secondary | ICD-10-CM | POA: Diagnosis not present

## 2023-11-04 DIAGNOSIS — N183 Chronic kidney disease, stage 3 unspecified: Secondary | ICD-10-CM | POA: Diagnosis not present

## 2023-11-04 DIAGNOSIS — M810 Age-related osteoporosis without current pathological fracture: Secondary | ICD-10-CM | POA: Diagnosis not present

## 2023-11-04 DIAGNOSIS — E1143 Type 2 diabetes mellitus with diabetic autonomic (poly)neuropathy: Secondary | ICD-10-CM | POA: Diagnosis not present

## 2023-11-04 DIAGNOSIS — I10 Essential (primary) hypertension: Secondary | ICD-10-CM | POA: Diagnosis not present

## 2023-11-04 DIAGNOSIS — I2510 Atherosclerotic heart disease of native coronary artery without angina pectoris: Secondary | ICD-10-CM | POA: Diagnosis not present

## 2023-11-05 ENCOUNTER — Ambulatory Visit: Attending: Neurology

## 2023-11-05 DIAGNOSIS — R262 Difficulty in walking, not elsewhere classified: Secondary | ICD-10-CM | POA: Insufficient documentation

## 2023-11-05 DIAGNOSIS — M6281 Muscle weakness (generalized): Secondary | ICD-10-CM | POA: Diagnosis not present

## 2023-11-05 DIAGNOSIS — R2681 Unsteadiness on feet: Secondary | ICD-10-CM | POA: Diagnosis not present
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
## 2023-11-09 ENCOUNTER — Ambulatory Visit

## 2023-11-09 DIAGNOSIS — R2681 Unsteadiness on feet: Secondary | ICD-10-CM | POA: Diagnosis not present

## 2023-11-09 DIAGNOSIS — M6281 Muscle weakness (generalized): Secondary | ICD-10-CM | POA: Diagnosis not present

## 2023-11-09 DIAGNOSIS — R262 Difficulty in walking, not elsewhere classified: Secondary | ICD-10-CM | POA: Diagnosis not present

## 2023-11-09 NOTE — Therapy (Signed)
 OUTPATIENT PHYSICAL THERAPY LOWER EXTREMITY TREATMENT   Patient Name: Pamela Snyder MRN: [id number] DOB:1945-09-12, 78 y.o., female Today's Date: 11/09/2023  END OF SESSION:  PT End of Session - 11/09/23 1529     Visit Number 4    Date for PT Re-Evaluation 01/11/24    Authorization Type Medicare    PT Start Time 1530    PT Stop Time 1620    PT Time Calculation (min) 50 min    Activity Tolerance Patient tolerated treatment well    Behavior During Therapy [facility] for tasks assessed/performed                Past Medical History:  Diagnosis Date   Anxiety    Arthritis    "right foot; spine; hands" (11/23/2014)   Charcot foot due to diabetes mellitus (HCC)    Depression    GERD (gastroesophageal reflux disease)    Gout    Hyperlipemia    Hypertension    Migraine    hx   Neuropathy    Numbness    Thyroid  goiter 1986   Type II diabetes mellitus (HCC)    Past Surgical History:  Procedure Laterality Date   ABDOMINAL HYSTERECTOMY  1999   w/BSO   ACHILLES TENDON LENGTHENING Right 11/23/2014   ACHILLES TENDON LENGTHENING Right 11/23/2014   Procedure: RIGHT ACHILLES PERCUTANEOUS TENDON LENGTHENING;  Surgeon: Amada Backer, MD;  Location: [facility] OR;  Service: Orthopedics;  Laterality: Right;   APPENDECTOMY  1963   ARTHRODESIS Right 11/23/2014   mid foot   CARDIAC CATHETERIZATION  08/2004   FOOT ARTHRODESIS Right 11/23/2014   Procedure: RIGHT MID FOOT ARTHRODESIS;  Surgeon: Amada Backer, MD;  Location: [facility] OR;  Service: Orthopedics;  Laterality: Right;   METATARSAL OSTEOTOMY Right 11/23/2014   Procedure: RIGHT MID FOOT OSTEOTOMY;  Surgeon: Amada Backer, MD;  Location: [facility] OR;  Service: Orthopedics;  Laterality: Right;   ORIF ANKLE FRACTURE Left 05/16/2021   Procedure: OPEN REDUCTION INTERNAL FIXATION (ORIF) Left ankle lateral malleolus, possible deltoid ligament repair;  Surgeon: Amada Backer, MD;  Location: [facility] OR;  Service: Orthopedics;  Laterality: Left;   OSTEOTOMY Right 11/23/2014    mid foot   PARTIAL THYMECTOMY  1986   ? side   TONSILLECTOMY  1954   Patient Active Problem List   Diagnosis Date Noted   Closed low lateral malleolus fracture 05/16/2021   Gout 05/16/2021   Hyperlipemia 05/16/2021   Hypertension 05/16/2021   Type II diabetes mellitus (HCC) 05/16/2021   Spinal stenosis of lumbar region 03/10/2019   Cervical myelopathy (HCC) 03/10/2019   Peripheral neuropathy 02/02/2019   Gait abnormality 02/02/2019   Urinary urgency 02/02/2019   Chronic bilateral low back pain without sciatica 02/02/2019   DOE (dyspnea on exertion) 10/05/2017   Bruit of right carotid artery 10/05/2017   Coronary artery calcification seen on CT scan 10/05/2017   Chest pain 10/04/2017   Aortic atherosclerosis (HCC) 10/04/2017   Charcot foot due to diabetes mellitus (HCC) 11/23/2014    PCP: Darnelle Elders, PA  REFERRING PROVIDER: Clotilde Danker, PA  REFERRING DIAG: poor balance, weakness, difficulty walking  THERAPY DIAG:  Muscle weakness (generalized)  Unsteady gait  Difficulty in walking, not elsewhere classified  Rationale for Evaluation and Treatment: Rehabilitation  ONSET DATE: 09/22/23  SUBJECTIVE:   SUBJECTIVE STATEMENT: Fell like crap, didn't sleep last night. Low back was killing me this morning, but it has gotten a little better.    Had a fall in November 2022 and sustained a left  Malleolar fracture with ORIF.  She was suffering from neuropathy, had just started to use a walker but prior to that did not use a device.  We saw her April 2024 and she progressed well with walking, strength and function.  She returns today due to weakness, difficulty walking, decreased endurance, and unsteady gait.  Denies any recent falls.  She reports that she is walking much slower and is a little more unsteady, feels weak, reports that she is taking Lyrica  and Cymbalta.  PERTINENT HISTORY: See above PAIN:  Are you having pain? Yes: NPRS scale: 3/10 Pain location: low  back Pain description: tight, stabbing Aggravating factors: being on feet longer, twisting pain pain up to 6/10 Relieving factors: rest pain will go to 0/10  PRECAUTIONS: Fall  WEIGHT BEARING RESTRICTIONS: No  FALLS:  Has patient fallen in last 6 months? No, just unsteady  LIVING ENVIRONMENT: Lives with: lives alone Lives in: House/apartment Stairs: Yes: Internal: 12 steps; can reach both Has following equipment at home: Otho Blitz - 2 wheeled, Tour manager, and Grab bars  OCCUPATION: retired  PLOF: Independent with household mobility with device and Needs assistance with homemaking has two daughters that check on her every other day, she does some cooking, does some housework  PATIENT GOALS: may go to the beach in the fall, stay living at home independently  NEXT MD VISIT: none scheduled  OBJECTIVE:   DIAGNOSTIC FINDINGS: none  COGNITION: Overall cognitive status: Within functional limits for tasks assessed     SENSATION: Mid shin down very limited sensation light touch can feel some deeper touch  EDEMA:  Very mild at the ankles  MUSCLE LENGTH: Mild tightness in the HS  POSTURE: rounded shoulders and forward head  PALPATION: Tight and tender in the lumbar paraspinals  LOWER EXTREMITY ROM:  Active ROM Right eval Left eval  Hip flexion 80 80  Hip extension    Hip abduction    Hip adduction    Hip internal rotation    Hip external rotation    Knee flexion    Knee extension 0 0  Ankle dorsiflexion 0 0  Ankle plantarflexion    Ankle inversion    Ankle eversion     (Blank rows = not tested)  LOWER EXTREMITY MMT:  MMT Right eval Left eval  Hip flexion 4 4-  Hip extension 4- 4-  Hip abduction 4- 4-  Hip adduction    Hip internal rotation    Hip external rotation    Knee flexion 4- 4-  Knee extension 4 34  Ankle dorsiflexion 1+ 0  Ankle plantarflexion 1 1  Ankle inversion 1 0  Ankle eversion 0 0   (Blank rows = not tested) FUNCTIONAL TESTS:   TUG: 37 seconds with FWW at evaluation walk test with CGA and FWW = 158 feet at evaluation, had to stop due to fatigue Unable to stand without holding onto something  GAIT: Distance walked: 158 feet Assistive device utilized: Walker - 2 wheeled Level of assistance: SBA to start and then needs CGA Comments: foot drop bilaterally, decreased foot control, some hip drop on the left side, when fatigued poor safety with transfers   TODAY'S TREATMENT:  DATE:   11/09/23 Walking outdoors out front door and into back door- one curb  Leg ext 10# 2x10  HS curls 20# 2x10  Lat pull down 20# 2x10 Chest press 5# 2x10  Seated flexion with black band 2x10 Seated volleyball hits    11/05/23 NuStep L5x33mins  Walking outdoors- down one curbs and around building  [place] taps  Marching with 2# 2x10 Hip abduction 2# 2x10 LAQ 2# 2x10    11/02/23 Nustep level 5 x 6 minutes Gait around half the back parking [place] with one sitting rest break 4" step ups with CGA Seated volley ball  PATIENT EDUCATION:  Education details: POC Person educated: Patient Education method: Explanation Education comprehension: verbalized understanding  HOME EXERCISE PROGRAM: TBD  ASSESSMENT:  CLINICAL IMPRESSION:  Patient returns with ongoing complaints of unsteadiness and poor balance. She feels her legs are really weak and is tired today. She requested to walk outside today so we went around the front door and around the building, step up on curb required modA. Started her back on the some machine level interventions.   Patient is a 78 y.o. female who was seen today for physical therapy evaluation and treatment for weakness, unsteady gait, poor balance.  She has minimal movements at the ankles and minimal sensation to the ankles and lower legs.  She was very slow with the TUG and slow with  the 3 minute walk test.  Due to the neuropathy her balance is very poor, cannot stand without holding onto something, is starting to have issues with her hands, she had not had any falls but is very careful  OBJECTIVE IMPAIRMENTS: Abnormal gait, cardiopulmonary status limiting activity, decreased activity tolerance, decreased balance, decreased coordination, decreased endurance, decreased mobility, difficulty walking, decreased ROM, decreased strength, impaired flexibility, impaired sensation, and pain.   REHAB POTENTIAL: Good  CLINICAL DECISION MAKING: Stable/uncomplicated  EVALUATION COMPLEXITY: Low   GOALS: Goals reviewed with patient? Yes  SHORT TERM GOALS: Target date: 11/11/23 Independent with initial HEP Goal status: INITIAL  LONG TERM GOALS: Target date: 02/11/24  Independent with advanced HEP or gym program Goal status: INITIAL  2.  Decrease TUG time to 24 seconds Goal status: INITIAL  3.  Walk 300 feet in 3 minute walk test Goal status: INITIAL  4.  Be able to negotiate stairs and curbs with SBA with one at a time and a handrail Goal status: INITIAL  5.  Report able to warm up a meal without sitting Goal status: INITIAL   PLAN:  PT FREQUENCY: 1-2x/week  PT DURATION: 12 weeks  PLANNED INTERVENTIONS: Therapeutic exercises, Therapeutic activity, Neuromuscular re-education, Balance training, Gait training, Patient/Family education, Self Care, Joint mobilization, and Manual therapy  PLAN FOR NEXT SESSION: add exercises, balance, HEP   Donavon Fudge, PT 11/09/2023, 4:25 PM

## 2023-11-12 ENCOUNTER — Ambulatory Visit

## 2023-11-12 DIAGNOSIS — M6281 Muscle weakness (generalized): Secondary | ICD-10-CM | POA: Diagnosis not present

## 2023-11-12 DIAGNOSIS — R262 Difficulty in walking, not elsewhere classified: Secondary | ICD-10-CM

## 2023-11-12 DIAGNOSIS — R2681 Unsteadiness on feet: Secondary | ICD-10-CM | POA: Diagnosis not present

## 2023-11-12 NOTE — Therapy (Signed)
 OUTPATIENT PHYSICAL THERAPY LOWER EXTREMITY TREATMENT   Patient Name: Pamela Snyder MRN: [id number] DOB:11/09/1945, 78 y.o., female Today's Date: 11/12/2023  END OF SESSION:  PT End of Session - 11/12/23 1714     Visit Number 5    Date for PT Re-Evaluation 01/11/24    Authorization Type Medicare    PT Start Time 1715    PT Stop Time 1800    PT Time Calculation (min) 45 min    Activity Tolerance Patient tolerated treatment well    Behavior During Therapy WFL for tasks assessed/performed                 Past Medical History:  Diagnosis Date   Anxiety    Arthritis    "right foot; spine; hands" (11/23/2014)   Charcot foot due to diabetes mellitus (HCC)    Depression    GERD (gastroesophageal reflux disease)    Gout    Hyperlipemia    Hypertension    Migraine    hx   Neuropathy    Numbness    Thyroid  goiter 1986   Type II diabetes mellitus (HCC)    Past Surgical History:  Procedure Laterality Date   ABDOMINAL HYSTERECTOMY  1999   w/BSO   ACHILLES TENDON LENGTHENING Right 11/23/2014   ACHILLES TENDON LENGTHENING Right 11/23/2014   Procedure: RIGHT ACHILLES PERCUTANEOUS TENDON LENGTHENING;  Surgeon: Amada Backer, MD;  Location: [facility] OR;  Service: Orthopedics;  Laterality: Right;   APPENDECTOMY  1963   ARTHRODESIS Right 11/23/2014   mid foot   CARDIAC CATHETERIZATION  08/2004   FOOT ARTHRODESIS Right 11/23/2014   Procedure: RIGHT MID FOOT ARTHRODESIS;  Surgeon: Amada Backer, MD;  Location: [facility];  Service: Orthopedics;  Laterality: Right;   METATARSAL OSTEOTOMY Right 11/23/2014   Procedure: RIGHT MID FOOT OSTEOTOMY;  Surgeon: Amada Backer, MD;  Location: [facility] OR;  Service: Orthopedics;  Laterality: Right;   ORIF ANKLE FRACTURE Left 05/16/2021   Procedure: OPEN REDUCTION INTERNAL FIXATION (ORIF) Left ankle lateral malleolus, possible deltoid ligament repair;  Surgeon: Amada Backer, MD;  Location: [facility];  Service: Orthopedics;  Laterality: Left;   OSTEOTOMY Right  11/23/2014   mid foot   PARTIAL THYMECTOMY  1986   ? side   TONSILLECTOMY  1954   Patient Active Problem List   Diagnosis Date Noted   Closed low lateral malleolus fracture 05/16/2021   Gout 05/16/2021   Hyperlipemia 05/16/2021   Hypertension 05/16/2021   Type II diabetes mellitus (HCC) 05/16/2021   Spinal stenosis of lumbar region 03/10/2019   Cervical myelopathy (HCC) 03/10/2019   Peripheral neuropathy 02/02/2019   Gait abnormality 02/02/2019   Urinary urgency 02/02/2019   Chronic bilateral low back pain without sciatica 02/02/2019   DOE (dyspnea on exertion) 10/05/2017   Bruit of right carotid artery 10/05/2017   Coronary artery calcification seen on CT scan 10/05/2017   Chest pain 10/04/2017   Aortic atherosclerosis (HCC) 10/04/2017   Charcot foot due to diabetes mellitus (HCC) 11/23/2014    PCP: Darnelle Elders, PA  REFERRING PROVIDER: Clotilde Danker, PA  REFERRING DIAG: poor balance, weakness, difficulty walking  THERAPY DIAG:  Muscle weakness (generalized)  Unsteady gait  Difficulty in walking, not elsewhere classified  Rationale for Evaluation and Treatment: Rehabilitation  ONSET DATE: 09/22/23  SUBJECTIVE:   SUBJECTIVE STATEMENT: I am moving slow.    Had a fall in November 2022 and sustained a left Malleolar fracture with ORIF.  She was suffering from neuropathy, had just started to use a  walker but prior to that did not use a device.  We saw her April 2024 and she progressed well with walking, strength and function.  She returns today due to weakness, difficulty walking, decreased endurance, and unsteady gait.  Denies any recent falls.  She reports that she is walking much slower and is a little more unsteady, feels weak, reports that she is taking Lyrica  and Cymbalta.  PERTINENT HISTORY: See above PAIN:  Are you having pain? Yes: NPRS scale: 3/10 Pain location: low back Pain description: tight, stabbing Aggravating factors: being on feet longer, twisting  pain pain up to 6/10 Relieving factors: rest pain will go to 0/10  PRECAUTIONS: Fall  WEIGHT BEARING RESTRICTIONS: No  FALLS:  Has patient fallen in last 6 months? No, just unsteady  LIVING ENVIRONMENT: Lives with: lives alone Lives in: House/apartment Stairs: Yes: Internal: 12 steps; can reach both Has following equipment at home: Otho Blitz - 2 wheeled, Tour manager, and Grab bars  OCCUPATION: retired  PLOF: Independent with household mobility with device and Needs assistance with homemaking has two daughters that check on her every other day, she does some cooking, does some housework  PATIENT GOALS: may go to the beach in the fall, stay living at home independently  NEXT MD VISIT: none scheduled  OBJECTIVE:   DIAGNOSTIC FINDINGS: none  COGNITION: Overall cognitive status: Within functional limits for tasks assessed     SENSATION: Mid shin down very limited sensation light touch can feel some deeper touch  EDEMA:  Very mild at the ankles  MUSCLE LENGTH: Mild tightness in the HS  POSTURE: rounded shoulders and forward head  PALPATION: Tight and tender in the lumbar paraspinals  LOWER EXTREMITY ROM:  Active ROM Right eval Left eval  Hip flexion 80 80  Hip extension    Hip abduction    Hip adduction    Hip internal rotation    Hip external rotation    Knee flexion    Knee extension 0 0  Ankle dorsiflexion 0 0  Ankle plantarflexion    Ankle inversion    Ankle eversion     (Blank rows = not tested)  LOWER EXTREMITY MMT:  MMT Right eval Left eval  Hip flexion 4 4-  Hip extension 4- 4-  Hip abduction 4- 4-  Hip adduction    Hip internal rotation    Hip external rotation    Knee flexion 4- 4-  Knee extension 4 34  Ankle dorsiflexion 1+ 0  Ankle plantarflexion 1 1  Ankle inversion 1 0  Ankle eversion 0 0   (Blank rows = not tested) FUNCTIONAL TESTS:  TUG: 37 seconds with FWW at evaluation walk test with CGA and FWW = 158 feet at  evaluation, had to stop due to fatigue Unable to stand without holding onto something  GAIT: Distance walked: 158 feet Assistive device utilized: Walker - 2 wheeled Level of assistance: SBA to start and then needs CGA Comments: foot drop bilaterally, decreased foot control, some hip drop on the left side, when fatigued poor safety with transfers   TODAY'S TREATMENT:  DATE:   11/12/23 NuStep L5x34mins  3# box taps 6"  Fitter pushes 2 blue bands 2x10  Step up 4" with 2 hand rails x5  Lat pull down 20# 2x10 Chest press 10# 2x10 Chest fly with cable 5# x10    11/09/23 Walking outdoors out front door and into back door- one curb  Leg ext 10# 2x10  HS curls 20# 2x10  Lat pull down 20# 2x10 Chest press 5# 2x10  Seated flexion with black band 2x10 Seated volleyball hits    11/05/23 NuStep L5x71mins  Walking outdoors- down one curbs and around building  [place] taps  Marching with 2# 2x10 Hip abduction 2# 2x10 LAQ 2# 2x10    11/02/23 Nustep level 5 x 6 minutes Gait around half the back parking [place] with one sitting rest break 4" step ups with CGA Seated volley ball  PATIENT EDUCATION:  Education details: POC Person educated: Patient Education method: Explanation Education comprehension: verbalized understanding  HOME EXERCISE PROGRAM: TBD  ASSESSMENT:  CLINICAL IMPRESSION:  Patient returns with ongoing complaints of unsteadiness and poor balance. She feels her legs are really weak and is tired today. She requested to walk outside today so we went around the front door and around the building, step up on curb required modA. Started her back on the some machine level interventions.   Step ups on to 4" were very hard with the LLE, she needed some assistance to boost herself up.   Patient is a 78 y.o. female who was seen today for physical therapy  evaluation and treatment for weakness, unsteady gait, poor balance.  She has minimal movements at the ankles and minimal sensation to the ankles and lower legs.  She was very slow with the TUG and slow with the 3 minute walk test.  Due to the neuropathy her balance is very poor, cannot stand without holding onto something, is starting to have issues with her hands, she had not had any falls but is very careful  OBJECTIVE IMPAIRMENTS: Abnormal gait, cardiopulmonary status limiting activity, decreased activity tolerance, decreased balance, decreased coordination, decreased endurance, decreased mobility, difficulty walking, decreased ROM, decreased strength, impaired flexibility, impaired sensation, and pain.   REHAB POTENTIAL: Good  CLINICAL DECISION MAKING: Stable/uncomplicated  EVALUATION COMPLEXITY: Low   GOALS: Goals reviewed with patient? Yes  SHORT TERM GOALS: Target date: 11/11/23 Independent with initial HEP Goal status: INITIAL  LONG TERM GOALS: Target date: 02/11/24  Independent with advanced HEP or gym program Goal status: INITIAL  2.  Decrease TUG time to 24 seconds Goal status: INITIAL  3.  Walk 300 feet in 3 minute walk test Goal status: INITIAL  4.  Be able to negotiate stairs and curbs with SBA with one at a time and a handrail Goal status: INITIAL  5.  Report able to warm up a meal without sitting Goal status: INITIAL   PLAN:  PT FREQUENCY: 1-2x/week  PT DURATION: 12 weeks  PLANNED INTERVENTIONS: Therapeutic exercises, Therapeutic activity, Neuromuscular re-education, Balance training, Gait training, Patient/Family education, Self Care, Joint mobilization, and Manual therapy  PLAN FOR NEXT SESSION: add exercises, balance, HEP   Donavon Fudge, PT 11/12/2023, 6:07 PM

## 2023-11-16 ENCOUNTER — Ambulatory Visit: Admitting: Physical Therapy

## 2023-11-16 ENCOUNTER — Encounter: Payer: Self-pay | Admitting: Physical Therapy

## 2023-11-16 DIAGNOSIS — R262 Difficulty in walking, not elsewhere classified: Secondary | ICD-10-CM

## 2023-11-16 DIAGNOSIS — M6281 Muscle weakness (generalized): Secondary | ICD-10-CM | POA: Diagnosis not present

## 2023-11-16 DIAGNOSIS — R2681 Unsteadiness on feet: Secondary | ICD-10-CM | POA: Diagnosis not present

## 2023-11-16 NOTE — Therapy (Signed)
 OUTPATIENT PHYSICAL THERAPY LOWER EXTREMITY TREATMENT   Patient Name: Pamela Snyder MRN: [id number] DOB:1945-08-13, 78 y.o., female Today's Date: 11/16/2023  END OF SESSION:  PT End of Session - 11/16/23 1616     Visit Number 6    Date for PT Re-Evaluation 01/11/24    Authorization Type Medicare    PT Start Time 1612    PT Stop Time 1700    PT Time Calculation (min) 48 min    Activity Tolerance Patient tolerated treatment well    Behavior During Therapy WFL for tasks assessed/performed                 Past Medical History:  Diagnosis Date   Anxiety    Arthritis    "right foot; spine; hands" (11/23/2014)   Charcot foot due to diabetes mellitus (HCC)    Depression    GERD (gastroesophageal reflux disease)    Gout    Hyperlipemia    Hypertension    Migraine    hx   Neuropathy    Numbness    Thyroid  goiter 1986   Type II diabetes mellitus (HCC)    Past Surgical History:  Procedure Laterality Date   ABDOMINAL HYSTERECTOMY  1999   w/BSO   ACHILLES TENDON LENGTHENING Right 11/23/2014   ACHILLES TENDON LENGTHENING Right 11/23/2014   Procedure: RIGHT ACHILLES PERCUTANEOUS TENDON LENGTHENING;  Surgeon: Amada Backer, MD;  Location: [facility];  Service: Orthopedics;  Laterality: Right;   APPENDECTOMY  1963   ARTHRODESIS Right 11/23/2014   mid foot   CARDIAC CATHETERIZATION  08/2004   FOOT ARTHRODESIS Right 11/23/2014   Procedure: RIGHT MID FOOT ARTHRODESIS;  Surgeon: Amada Backer, MD;  Location: [facility];  Service: Orthopedics;  Laterality: Right;   METATARSAL OSTEOTOMY Right 11/23/2014   Procedure: RIGHT MID FOOT OSTEOTOMY;  Surgeon: Amada Backer, MD;  Location: [facility];  Service: Orthopedics;  Laterality: Right;   ORIF ANKLE FRACTURE Left 05/16/2021   Procedure: OPEN REDUCTION INTERNAL FIXATION (ORIF) Left ankle lateral malleolus, possible deltoid ligament repair;  Surgeon: Amada Backer, MD;  Location: [facility];  Service: Orthopedics;  Laterality: Left;   OSTEOTOMY Right  11/23/2014   mid foot   PARTIAL THYMECTOMY  1986   ? side   TONSILLECTOMY  1954   Patient Active Problem List   Diagnosis Date Noted   Closed low lateral malleolus fracture 05/16/2021   Gout 05/16/2021   Hyperlipemia 05/16/2021   Hypertension 05/16/2021   Type II diabetes mellitus (HCC) 05/16/2021   Spinal stenosis of lumbar region 03/10/2019   Cervical myelopathy (HCC) 03/10/2019   Peripheral neuropathy 02/02/2019   Gait abnormality 02/02/2019   Urinary urgency 02/02/2019   Chronic bilateral low back pain without sciatica 02/02/2019   DOE (dyspnea on exertion) 10/05/2017   Bruit of right carotid artery 10/05/2017   Coronary artery calcification seen on CT scan 10/05/2017   Chest pain 10/04/2017   Aortic atherosclerosis (HCC) 10/04/2017   Charcot foot due to diabetes mellitus (HCC) 11/23/2014    PCP: Darnelle Elders, PA  REFERRING PROVIDER: Clotilde Danker, PA  REFERRING DIAG: poor balance, weakness, difficulty walking  THERAPY DIAG:  Muscle weakness (generalized)  Unsteady gait  Difficulty in walking, not elsewhere classified  Rationale for Evaluation and Treatment: Rehabilitation  ONSET DATE: 09/22/23  SUBJECTIVE:   SUBJECTIVE STATEMENT: Patient reports that she had a fall yesterday, she had been sitting on the back porch, when she got up her legs were weak and they felt stiff, she reports that she made  it inside and was trying to get around the couch and her legs "gave out", I haven't fallen in over a year   Had a fall in November 2022 and sustained a left Malleolar fracture with ORIF.  She was suffering from neuropathy, had just started to use a walker but prior to that did not use a device.  We saw her April 2024 and she progressed well with walking, strength and function.  She returns today due to weakness, difficulty walking, decreased endurance, and unsteady gait.  Denies any recent falls.  She reports that she is walking much slower and is a little more unsteady,  feels weak, reports that she is taking Lyrica  and Cymbalta.  PERTINENT HISTORY: See above PAIN:  Are you having pain? Yes: NPRS scale: 3/10 Pain location: low back Pain description: tight, stabbing Aggravating factors: being on feet longer, twisting pain pain up to 6/10 Relieving factors: rest pain will go to 0/10  PRECAUTIONS: Fall  WEIGHT BEARING RESTRICTIONS: No  FALLS:  Has patient fallen in last 6 months? No, just unsteady  LIVING ENVIRONMENT: Lives with: lives alone Lives in: House/apartment Stairs: Yes: Internal: 12 steps; can reach both Has following equipment at home: Otho Blitz - 2 wheeled, Tour manager, and Grab bars  OCCUPATION: retired  PLOF: Independent with household mobility with device and Needs assistance with homemaking has two daughters that check on her every other day, she does some cooking, does some housework  PATIENT GOALS: may go to the beach in the fall, stay living at home independently  NEXT MD VISIT: none scheduled  OBJECTIVE:   DIAGNOSTIC FINDINGS: none  COGNITION: Overall cognitive status: Within functional limits for tasks assessed     SENSATION: Mid shin down very limited sensation light touch can feel some deeper touch  EDEMA:  Very mild at the ankles  MUSCLE LENGTH: Mild tightness in the HS  POSTURE: rounded shoulders and forward head  PALPATION: Tight and tender in the lumbar paraspinals  LOWER EXTREMITY ROM:  Active ROM Right eval Left eval  Hip flexion 80 80  Hip extension    Hip abduction    Hip adduction    Hip internal rotation    Hip external rotation    Knee flexion    Knee extension 0 0  Ankle dorsiflexion 0 0  Ankle plantarflexion    Ankle inversion    Ankle eversion     (Blank rows = not tested)  LOWER EXTREMITY MMT:  MMT Right eval Left eval  Hip flexion 4 4-  Hip extension 4- 4-  Hip abduction 4- 4-  Hip adduction    Hip internal rotation    Hip external rotation    Knee flexion 4- 4-   Knee extension 4 34  Ankle dorsiflexion 1+ 0  Ankle plantarflexion 1 1  Ankle inversion 1 0  Ankle eversion 0 0   (Blank rows = not tested) FUNCTIONAL TESTS:  TUG: 37 seconds with FWW at evaluation walk test with CGA and FWW = 158 feet at evaluation, had to stop due to fatigue Unable to stand without holding onto something  GAIT: Distance walked: 158 feet Assistive device utilized: Walker - 2 wheeled Level of assistance: SBA to start and then needs CGA Comments: foot drop bilaterally, decreased foot control, some hip drop on the left side, when fatigued poor safety with transfers   TODAY'S TREATMENT:  DATE:   11/16/23 Nustep level 5 x 7 minutes LEg curls 20# 2x10 Leg extension 10# 2x12 Lat pulls 20# 2x12 Chest press 10# 2x12 Leg press 20# Seated voilley ball Seated green tband clamshells  11/12/23 NuStep L5x62mins  3# box taps 6"  Fitter pushes 2 blue bands 2x10  Step up 4" with 2 hand rails x5  Lat pull down 20# 2x10 Chest press 10# 2x10 Chest fly with cable 5# x10    11/09/23 Walking outdoors out front door and into back door- one curb  Leg ext 10# 2x10  HS curls 20# 2x10  Lat pull down 20# 2x10 Chest press 5# 2x10  Seated flexion with black band 2x10 Seated volleyball hits    11/05/23 NuStep L5x84mins  Walking outdoors- down one curbs and around building  [place] taps  Marching with 2# 2x10 Hip abduction 2# 2x10 LAQ 2# 2x10    11/02/23 Nustep level 5 x 6 minutes Gait around half the back parking [place] with one sitting rest break 4" step ups with CGA Seated volley ball  PATIENT EDUCATION:  Education details: POC Person educated: Patient Education method: Explanation Education comprehension: verbalized understanding  HOME EXERCISE PROGRAM: TBD  ASSESSMENT:  CLINICAL IMPRESSION:  Patient returns with ongoing complaints of  unsteadiness and poor balance. She had a fall yesterday reports that her legs just gave out.  Denies injury.  She has difficulty getting up a 4-6" curb, so we added the leg press today, needs a lot of help getting on and off.   Patient is a 78 y.o. female who was seen today for physical therapy evaluation and treatment for weakness, unsteady gait, poor balance.  She has minimal movements at the ankles and minimal sensation to the ankles and lower legs.  She was very slow with the TUG and slow with the 3 minute walk test.  Due to the neuropathy her balance is very poor, cannot stand without holding onto something, is starting to have issues with her hands, she had not had any falls but is very careful  OBJECTIVE IMPAIRMENTS: Abnormal gait, cardiopulmonary status limiting activity, decreased activity tolerance, decreased balance, decreased coordination, decreased endurance, decreased mobility, difficulty walking, decreased ROM, decreased strength, impaired flexibility, impaired sensation, and pain.   REHAB POTENTIAL: Good  CLINICAL DECISION MAKING: Stable/uncomplicated  EVALUATION COMPLEXITY: Low   GOALS: Goals reviewed with patient? Yes  SHORT TERM GOALS: Target date: 11/11/23 Independent with initial HEP Goal status: progressing 11/16/23  LONG TERM GOALS: Target date: 02/11/24  Independent with advanced HEP or gym program Goal status: INITIAL  2.  Decrease TUG time to 24 seconds Goal status: INITIAL  3.  Walk 300 feet in 3 minute walk test Goal status: INITIAL  4.  Be able to negotiate stairs and curbs with SBA with one at a time and a handrail Goal status: INITIAL  5.  Report able to warm up a meal without sitting Goal status: INITIAL   PLAN:  PT FREQUENCY: 1-2x/week  PT DURATION: 12 weeks  PLANNED INTERVENTIONS: Therapeutic exercises, Therapeutic activity, Neuromuscular re-education, Balance training, Gait training, Patient/Family education, Self Care, Joint mobilization,  and Manual therapy  PLAN FOR NEXT SESSION: add exercises, balance, HEP   Salma Walrond W, PT 11/16/2023, 4:18 PM

## 2023-11-18 DIAGNOSIS — I10 Essential (primary) hypertension: Secondary | ICD-10-CM | POA: Diagnosis not present

## 2023-11-18 DIAGNOSIS — N183 Chronic kidney disease, stage 3 unspecified: Secondary | ICD-10-CM | POA: Diagnosis not present

## 2023-11-18 DIAGNOSIS — I2510 Atherosclerotic heart disease of native coronary artery without angina pectoris: Secondary | ICD-10-CM | POA: Diagnosis not present

## 2023-11-18 DIAGNOSIS — E1143 Type 2 diabetes mellitus with diabetic autonomic (poly)neuropathy: Secondary | ICD-10-CM | POA: Diagnosis not present

## 2023-11-19 ENCOUNTER — Ambulatory Visit

## 2023-11-19 DIAGNOSIS — M6281 Muscle weakness (generalized): Secondary | ICD-10-CM

## 2023-11-19 DIAGNOSIS — R2681 Unsteadiness on feet: Secondary | ICD-10-CM

## 2023-11-19 DIAGNOSIS — R262 Difficulty in walking, not elsewhere classified: Secondary | ICD-10-CM

## 2023-11-19 NOTE — Therapy (Signed)
 OUTPATIENT PHYSICAL THERAPY LOWER EXTREMITY TREATMENT   Patient Name: Pamela Snyder MRN: [id number] DOB:[birth year], 78 y.o., female Today's Date: 11/19/2023  END OF SESSION:  PT End of Session - 11/19/23 1714     Visit Number 7    Date for PT Re-Evaluation 01/11/24    Authorization Type Medicare    PT Start Time 1715    PT Stop Time 1800    PT Time Calculation (min) 45 min    Activity Tolerance Patient tolerated treatment well    Behavior During Therapy WFL for tasks assessed/performed                  Past Medical History:  Diagnosis Date   Anxiety    Arthritis    "right foot; spine; hands" (11/23/2014)   Charcot foot due to diabetes mellitus (HCC)    Depression    GERD (gastroesophageal reflux disease)    Gout    Hyperlipemia    Hypertension    Migraine    hx   Neuropathy    Numbness    Thyroid  goiter 1986   Type II diabetes mellitus (HCC)    Past Surgical History:  Procedure Laterality Date   ABDOMINAL HYSTERECTOMY  1999   w/BSO   ACHILLES TENDON LENGTHENING Right 11/23/2014   ACHILLES TENDON LENGTHENING Right 11/23/2014   Procedure: RIGHT ACHILLES PERCUTANEOUS TENDON LENGTHENING;  Surgeon: Amada Backer, MD;  Location: [facility];  Service: Orthopedics;  Laterality: Right;   APPENDECTOMY  1963   ARTHRODESIS Right 11/23/2014   mid foot   CARDIAC CATHETERIZATION  08/2004   FOOT ARTHRODESIS Right 11/23/2014   Procedure: RIGHT MID FOOT ARTHRODESIS;  Surgeon: Amada Backer, MD;  Location: [facility];  Service: Orthopedics;  Laterality: Right;   METATARSAL OSTEOTOMY Right 11/23/2014   Procedure: RIGHT MID FOOT OSTEOTOMY;  Surgeon: Amada Backer, MD;  Location: [facility];  Service: Orthopedics;  Laterality: Right;   ORIF ANKLE FRACTURE Left 05/16/2021   Procedure: OPEN REDUCTION INTERNAL FIXATION (ORIF) Left ankle lateral malleolus, possible deltoid ligament repair;  Surgeon: Amada Backer, MD;  Location: [facility];  Service: Orthopedics;  Laterality: Left;   OSTEOTOMY Right  11/23/2014   mid foot   PARTIAL THYMECTOMY  1986   ? side   TONSILLECTOMY  1954   Patient Active Problem List   Diagnosis Date Noted   Closed low lateral malleolus fracture 05/16/2021   Gout 05/16/2021   Hyperlipemia 05/16/2021   Hypertension 05/16/2021   Type II diabetes mellitus (HCC) 05/16/2021   Spinal stenosis of lumbar region 03/10/2019   Cervical myelopathy (HCC) 03/10/2019   Peripheral neuropathy 02/02/2019   Gait abnormality 02/02/2019   Urinary urgency 02/02/2019   Chronic bilateral low back pain without sciatica 02/02/2019   DOE (dyspnea on exertion) 10/05/2017   Bruit of right carotid artery 10/05/2017   Coronary artery calcification seen on CT scan 10/05/2017   Chest pain 10/04/2017   Aortic atherosclerosis (HCC) 10/04/2017   Charcot foot due to diabetes mellitus (HCC) 11/23/2014    PCP: Darnelle Elders, PA  REFERRING PROVIDER: Clotilde Danker, PA  REFERRING DIAG: poor balance, weakness, difficulty walking  THERAPY DIAG:  Muscle weakness (generalized)  Unsteady gait  Difficulty in walking, not elsewhere classified  Rationale for Evaluation and Treatment: Rehabilitation  ONSET DATE: 09/22/23  SUBJECTIVE:   SUBJECTIVE STATEMENT: No falls since the last one.    Had a fall in November 2022 and sustained a left Malleolar fracture with ORIF.  She was suffering from neuropathy, had just started  to use a walker but prior to that did not use a device.  We saw her April 2024 and she progressed well with walking, strength and function.  She returns today due to weakness, difficulty walking, decreased endurance, and unsteady gait.  Denies any recent falls.  She reports that she is walking much slower and is a little more unsteady, feels weak, reports that she is taking Lyrica  and Cymbalta.  PERTINENT HISTORY: See above PAIN:  Are you having pain? Yes: NPRS scale: 3/10 Pain location: low back Pain description: tight, stabbing Aggravating factors: being on feet  longer, twisting pain pain up to 6/10 Relieving factors: rest pain will go to 0/10  PRECAUTIONS: Fall  WEIGHT BEARING RESTRICTIONS: No  FALLS:  Has patient fallen in last 6 months? No, just unsteady  LIVING ENVIRONMENT: Lives with: lives alone Lives in: House/apartment Stairs: Yes: Internal: 12 steps; can reach both Has following equipment at home: Otho Blitz - 2 wheeled, Tour manager, and Grab bars  OCCUPATION: retired  PLOF: Independent with household mobility with device and Needs assistance with homemaking has two daughters that check on her every other day, she does some cooking, does some housework  PATIENT GOALS: may go to the beach in the fall, stay living at home independently  NEXT MD VISIT: none scheduled  OBJECTIVE:   DIAGNOSTIC FINDINGS: none  COGNITION: Overall cognitive status: Within functional limits for tasks assessed     SENSATION: Mid shin down very limited sensation light touch can feel some deeper touch  EDEMA:  Very mild at the ankles  MUSCLE LENGTH: Mild tightness in the HS  POSTURE: rounded shoulders and forward head  PALPATION: Tight and tender in the lumbar paraspinals  LOWER EXTREMITY ROM:  Active ROM Right eval Left eval  Hip flexion 80 80  Hip extension    Hip abduction    Hip adduction    Hip internal rotation    Hip external rotation    Knee flexion    Knee extension 0 0  Ankle dorsiflexion 0 0  Ankle plantarflexion    Ankle inversion    Ankle eversion     (Blank rows = not tested)  LOWER EXTREMITY MMT:  MMT Right eval Left eval  Hip flexion 4 4-  Hip extension 4- 4-  Hip abduction 4- 4-  Hip adduction    Hip internal rotation    Hip external rotation    Knee flexion 4- 4-  Knee extension 4 34  Ankle dorsiflexion 1+ 0  Ankle plantarflexion 1 1  Ankle inversion 1 0  Ankle eversion 0 0   (Blank rows = not tested) FUNCTIONAL TESTS:  TUG: 37 seconds with FWW at evaluation walk test with CGA and FWW =  158 feet at evaluation, had to stop due to fatigue Unable to stand without holding onto something  GAIT: Distance walked: 158 feet Assistive device utilized: Walker - 2 wheeled Level of assistance: SBA to start and then needs CGA Comments: foot drop bilaterally, decreased foot control, some hip drop on the left side, when fatigued poor safety with transfers   TODAY'S TREATMENT:  DATE:   11/19/23 NuStep L5x33mins  TUG- 32s  Lat pull down 20# 2x10  Feet on pball rotations, knees to chest x10 Bridges x10 Passive LE stretching- HS, glutes, piriformis Isometric abs x10 3s holds  AB roll small crunches x10 SLR x10, SLR 1.5# x10   11/16/23 Nustep level 5 x 7 minutes LEg curls 20# 2x10 Leg extension 10# 2x12 Lat pulls 20# 2x12 Chest press 10# 2x12 Leg press 20# Seated voilley ball Seated green tband clamshells  11/12/23 NuStep L5x21mins  3# box taps 6"  Fitter pushes 2 blue bands 2x10  Step up 4" with 2 hand rails x5  Lat pull down 20# 2x10 Chest press 10# 2x10 Chest fly with cable 5# x10    11/09/23 Walking outdoors out front door and into back door- one curb  Leg ext 10# 2x10  HS curls 20# 2x10  Lat pull down 20# 2x10 Chest press 5# 2x10  Seated flexion with black band 2x10 Seated volleyball hits    11/05/23 NuStep L5x35mins  Walking outdoors- down one curbs and around building  [place] taps  Marching with 2# 2x10 Hip abduction 2# 2x10 LAQ 2# 2x10    11/02/23 Nustep level 5 x 6 minutes Gait around half the back parking [place] with one sitting rest break 4" step ups with CGA Seated volley ball  PATIENT EDUCATION:  Education details: POC Person educated: Patient Education method: Explanation Education comprehension: verbalized understanding  HOME EXERCISE PROGRAM: TBD  ASSESSMENT:  CLINICAL IMPRESSION:  Patient returns with ongoing  complaints of unsteadiness and poor balance. She was able to shave off 5s with TUG. We worked mostly on supine level activities today, to target core and low back. LLE is weak compared to R side. Weakness noted in core with all interventions. Pt reports she liked the stretching today.    Patient is a 78 y.o. female who was seen today for physical therapy evaluation and treatment for weakness, unsteady gait, poor balance.  She has minimal movements at the ankles and minimal sensation to the ankles and lower legs.  She was very slow with the TUG and slow with the 3 minute walk test.  Due to the neuropathy her balance is very poor, cannot stand without holding onto something, is starting to have issues with her hands, she had not had any falls but is very careful  OBJECTIVE IMPAIRMENTS: Abnormal gait, cardiopulmonary status limiting activity, decreased activity tolerance, decreased balance, decreased coordination, decreased endurance, decreased mobility, difficulty walking, decreased ROM, decreased strength, impaired flexibility, impaired sensation, and pain.   REHAB POTENTIAL: Good  CLINICAL DECISION MAKING: Stable/uncomplicated  EVALUATION COMPLEXITY: Low   GOALS: Goals reviewed with patient? Yes  SHORT TERM GOALS: Target date: 11/11/23 Independent with initial HEP Goal status: progressing 11/16/23  LONG TERM GOALS: Target date: 02/11/24  Independent with advanced HEP or gym program Goal status: INITIAL  2.  Decrease TUG time to 24 seconds Goal status: IN PROGRESS 32s 11/19/23  3.  Walk 300 feet in 3 minute walk test Goal status: INITIAL  4.  Be able to negotiate stairs and curbs with SBA with one at a time and a handrail Goal status: INITIAL  5.  Report able to warm up a meal without sitting Goal status: INITIAL   PLAN:  PT FREQUENCY: 1-2x/week  PT DURATION: 12 weeks  PLANNED INTERVENTIONS: Therapeutic exercises, Therapeutic activity, Neuromuscular re-education, Balance  training, Gait training, Patient/Family education, Self Care, Joint mobilization, and Manual therapy  PLAN FOR NEXT SESSION: add exercises,  balance, HEP Side step on airex in bars  Marching on airex in bars Mini squats in bars    [employer], PT 11/19/2023, 6:05 PM

## 2023-11-23 ENCOUNTER — Ambulatory Visit

## 2023-11-23 DIAGNOSIS — R262 Difficulty in walking, not elsewhere classified: Secondary | ICD-10-CM

## 2023-11-23 DIAGNOSIS — R2681 Unsteadiness on feet: Secondary | ICD-10-CM | POA: Diagnosis not present

## 2023-11-23 DIAGNOSIS — M6281 Muscle weakness (generalized): Secondary | ICD-10-CM | POA: Diagnosis not present

## 2023-11-23 NOTE — Therapy (Signed)
 OUTPATIENT PHYSICAL THERAPY LOWER EXTREMITY TREATMENT   Patient Name: Pamela Snyder MRN: [id number] DOB:1945-11-09, 78 y.o., female Today's Date: 11/23/2023  END OF SESSION:  PT End of Session - 11/23/23 1617     Visit Number 8    Date for PT Re-Evaluation 01/11/24    Authorization Type Medicare    PT Start Time 1615    PT Stop Time 1700    PT Time Calculation (min) 45 min    Activity Tolerance Patient tolerated treatment well    Behavior During Therapy WFL for tasks assessed/performed                   Past Medical History:  Diagnosis Date   Anxiety    Arthritis    "right foot; spine; hands" (11/23/2014)   Charcot foot due to diabetes mellitus (HCC)    Depression    GERD (gastroesophageal reflux disease)    Gout    Hyperlipemia    Hypertension    Migraine    hx   Neuropathy    Numbness    Thyroid  goiter 1986   Type II diabetes mellitus (HCC)    Past Surgical History:  Procedure Laterality Date   ABDOMINAL HYSTERECTOMY  1999   w/BSO   ACHILLES TENDON LENGTHENING Right 11/23/2014   ACHILLES TENDON LENGTHENING Right 11/23/2014   Procedure: RIGHT ACHILLES PERCUTANEOUS TENDON LENGTHENING;  Surgeon: Amada Backer, MD;  Location: [facility];  Service: Orthopedics;  Laterality: Right;   APPENDECTOMY  1963   ARTHRODESIS Right 11/23/2014   mid foot   CARDIAC CATHETERIZATION  08/2004   FOOT ARTHRODESIS Right 11/23/2014   Procedure: RIGHT MID FOOT ARTHRODESIS;  Surgeon: Amada Backer, MD;  Location: [facility];  Service: Orthopedics;  Laterality: Right;   METATARSAL OSTEOTOMY Right 11/23/2014   Procedure: RIGHT MID FOOT OSTEOTOMY;  Surgeon: Amada Backer, MD;  Location: [facility];  Service: Orthopedics;  Laterality: Right;   ORIF ANKLE FRACTURE Left 05/16/2021   Procedure: OPEN REDUCTION INTERNAL FIXATION (ORIF) Left ankle lateral malleolus, possible deltoid ligament repair;  Surgeon: Amada Backer, MD;  Location: [facility];  Service: Orthopedics;  Laterality: Left;   OSTEOTOMY Right  11/23/2014   mid foot   PARTIAL THYMECTOMY  1986   ? side   TONSILLECTOMY  1954   Patient Active Problem List   Diagnosis Date Noted   Closed low lateral malleolus fracture 05/16/2021   Gout 05/16/2021   Hyperlipemia 05/16/2021   Hypertension 05/16/2021   Type II diabetes mellitus (HCC) 05/16/2021   Spinal stenosis of lumbar region 03/10/2019   Cervical myelopathy (HCC) 03/10/2019   Peripheral neuropathy 02/02/2019   Gait abnormality 02/02/2019   Urinary urgency 02/02/2019   Chronic bilateral low back pain without sciatica 02/02/2019   DOE (dyspnea on exertion) 10/05/2017   Bruit of right carotid artery 10/05/2017   Coronary artery calcification seen on CT scan 10/05/2017   Chest pain 10/04/2017   Aortic atherosclerosis (HCC) 10/04/2017   Charcot foot due to diabetes mellitus (HCC) 11/23/2014    PCP: Darnelle Elders, PA  REFERRING PROVIDER: Clotilde Danker, PA  REFERRING DIAG: poor balance, weakness, difficulty walking  THERAPY DIAG:  Muscle weakness (generalized)  Unsteady gait  Difficulty in walking, not elsewhere classified  Rationale for Evaluation and Treatment: Rehabilitation  ONSET DATE: 09/22/23  SUBJECTIVE:   SUBJECTIVE STATEMENT: Doing alright. Has trouble getting out of a chair at a restaurant this weekend. The chair was too low. I was able to get up on the 5th try with just  a little help.    Had a fall in November 2022 and sustained a left Malleolar fracture with ORIF.  She was suffering from neuropathy, had just started to use a walker but prior to that did not use a device.  We saw her April 2024 and she progressed well with walking, strength and function.  She returns today due to weakness, difficulty walking, decreased endurance, and unsteady gait.  Denies any recent falls.  She reports that she is walking much slower and is a little more unsteady, feels weak, reports that she is taking Lyrica  and Cymbalta.  PERTINENT HISTORY: See above PAIN:  Are you  having pain? Yes: NPRS scale: 3/10 Pain location: low back Pain description: tight, stabbing Aggravating factors: being on feet longer, twisting pain pain up to 6/10 Relieving factors: rest pain will go to 0/10  PRECAUTIONS: Fall  WEIGHT BEARING RESTRICTIONS: No  FALLS:  Has patient fallen in last 6 months? No, just unsteady  LIVING ENVIRONMENT: Lives with: lives alone Lives in: House/apartment Stairs: Yes: Internal: 12 steps; can reach both Has following equipment at home: Otho Blitz - 2 wheeled, Tour manager, and Grab bars  OCCUPATION: retired  PLOF: Independent with household mobility with device and Needs assistance with homemaking has two daughters that check on her every other day, she does some cooking, does some housework  PATIENT GOALS: may go to the beach in the fall, stay living at home independently  NEXT MD VISIT: none scheduled  OBJECTIVE:   DIAGNOSTIC FINDINGS: none  COGNITION: Overall cognitive status: Within functional limits for tasks assessed     SENSATION: Mid shin down very limited sensation light touch can feel some deeper touch  EDEMA:  Very mild at the ankles  MUSCLE LENGTH: Mild tightness in the HS  POSTURE: rounded shoulders and forward head  PALPATION: Tight and tender in the lumbar paraspinals  LOWER EXTREMITY ROM:  Active ROM Right eval Left eval  Hip flexion 80 80  Hip extension    Hip abduction    Hip adduction    Hip internal rotation    Hip external rotation    Knee flexion    Knee extension 0 0  Ankle dorsiflexion 0 0  Ankle plantarflexion    Ankle inversion    Ankle eversion     (Blank rows = not tested)  LOWER EXTREMITY MMT:  MMT Right eval Left eval  Hip flexion 4 4-  Hip extension 4- 4-  Hip abduction 4- 4-  Hip adduction    Hip internal rotation    Hip external rotation    Knee flexion 4- 4-  Knee extension 4 34  Ankle dorsiflexion 1+ 0  Ankle plantarflexion 1 1  Ankle inversion 1 0  Ankle eversion 0  0   (Blank rows = not tested) FUNCTIONAL TESTS:  TUG: 37 seconds with FWW at evaluation walk test with CGA and FWW = 158 feet at evaluation, had to stop due to fatigue Unable to stand without holding onto something  GAIT: Distance walked: 158 feet Assistive device utilized: Walker - 2 wheeled Level of assistance: SBA to start and then needs CGA Comments: foot drop bilaterally, decreased foot control, some hip drop on the left side, when fatigued poor safety with transfers   TODAY'S TREATMENT:  DATE:   11/23/23 3 min walk test- able to do 322ft in 2 min 40s  NuStep L5x57mins Leg ext 10# 2x10 HS curls 25# 2x10 Side step in bars  Mini squats in bars x10   11/19/23 NuStep L5x43mins  TUG- 32s  Lat pull down 20# 2x10  Feet on pball rotations, knees to chest x10 Bridges x10 Passive LE stretching- HS, glutes, piriformis Isometric abs x10 3s holds  AB roll small crunches x10 SLR x10, SLR 1.5# x10   11/16/23 Nustep level 5 x 7 minutes LEg curls 20# 2x10 Leg extension 10# 2x12 Lat pulls 20# 2x12 Chest press 10# 2x12 Leg press 20# Seated voilley ball Seated green tband clamshells  11/12/23 NuStep L5x41mins  3# box taps 6"  Fitter pushes 2 blue bands 2x10  Step up 4" with 2 hand rails x5  Lat pull down 20# 2x10 Chest press 10# 2x10 Chest fly with cable 5# x10    11/09/23 Walking outdoors out front door and into back door- one curb  Leg ext 10# 2x10  HS curls 20# 2x10  Lat pull down 20# 2x10 Chest press 5# 2x10  Seated flexion with black band 2x10 Seated volleyball hits    11/05/23 NuStep L5x44mins  Walking outdoors- down one curbs and around building  [place] taps  Marching with 2# 2x10 Hip abduction 2# 2x10 LAQ 2# 2x10    11/02/23 Nustep level 5 x 6 minutes Gait around half the back parking [place] with one sitting rest break 4" step ups  with CGA Seated volley ball  PATIENT EDUCATION:  Education details: POC Person educated: Patient Education method: Explanation Education comprehension: verbalized understanding  HOME EXERCISE PROGRAM: TBD  ASSESSMENT:  CLINICAL IMPRESSION: Patient returns with ongoing complaints of unsteadiness and poor balance. She was able to walk 337ft in under 3 mins, meeting her goal. However, she does have increased foot drop on the R foot that catches a few times today when walking. Had a difficult time getting up from standard chair today, needing modA while pulling up with bars.    Patient is a 78 y.o. female who was seen today for physical therapy evaluation and treatment for weakness, unsteady gait, poor balance.  She has minimal movements at the ankles and minimal sensation to the ankles and lower legs.  She was very slow with the TUG and slow with the 3 minute walk test.  Due to the neuropathy her balance is very poor, cannot stand without holding onto something, is starting to have issues with her hands, she had not had any falls but is very careful  OBJECTIVE IMPAIRMENTS: Abnormal gait, cardiopulmonary status limiting activity, decreased activity tolerance, decreased balance, decreased coordination, decreased endurance, decreased mobility, difficulty walking, decreased ROM, decreased strength, impaired flexibility, impaired sensation, and pain.   REHAB POTENTIAL: Good  CLINICAL DECISION MAKING: Stable/uncomplicated  EVALUATION COMPLEXITY: Low   GOALS: Goals reviewed with patient? Yes  SHORT TERM GOALS: Target date: 11/11/23 Independent with initial HEP Goal status: progressing 11/16/23  LONG TERM GOALS: Target date: 02/11/24  Independent with advanced HEP or gym program Goal status: INITIAL  2.  Decrease TUG time to 24 seconds Goal status: IN PROGRESS 32s 11/19/23  3.  Walk 300 feet in 3 minute walk test Goal status: MET 11/23/23  4.  Be able to negotiate stairs and curbs with  SBA with one at a time and a handrail Goal status: INITIAL  5.  Report able to warm up a meal without sitting Goal status: INITIAL  PLAN:  PT FREQUENCY: 1-2x/week  PT DURATION: 12 weeks  PLANNED INTERVENTIONS: Therapeutic exercises, Therapeutic activity, Neuromuscular re-education, Balance training, Gait training, Patient/Family education, Self Care, Joint mobilization, and Manual therapy  PLAN FOR NEXT SESSION: add exercises, balance, HEP, marching and side steps on airex in bars    Cumings, PT 11/23/2023, 5:00 PM

## 2023-11-26 ENCOUNTER — Ambulatory Visit

## 2023-11-26 DIAGNOSIS — R2681 Unsteadiness on feet: Secondary | ICD-10-CM | POA: Diagnosis not present

## 2023-11-26 DIAGNOSIS — M6281 Muscle weakness (generalized): Secondary | ICD-10-CM

## 2023-11-26 DIAGNOSIS — R262 Difficulty in walking, not elsewhere classified: Secondary | ICD-10-CM | POA: Diagnosis not present

## 2023-11-26 NOTE — Therapy (Signed)
 OUTPATIENT PHYSICAL THERAPY LOWER EXTREMITY TREATMENT   Patient Name: Pamela Snyder MRN: [id number] DOB:25-Jul-1945, 78 y.o., female Today's Date: 11/26/2023  END OF SESSION:  PT End of Session - 11/26/23 1717     Visit Number 9    Date for PT Re-Evaluation 01/11/24    Authorization Type Medicare    PT Start Time 1717    PT Stop Time 1800    PT Time Calculation (min) 43 min    Activity Tolerance Patient tolerated treatment well    Behavior During Therapy WFL for tasks assessed/performed                    Past Medical History:  Diagnosis Date   Anxiety    Arthritis    "right foot; spine; hands" (11/23/2014)   Charcot foot due to diabetes mellitus (HCC)    Depression    GERD (gastroesophageal reflux disease)    Gout    Hyperlipemia    Hypertension    Migraine    hx   Neuropathy    Numbness    Thyroid  goiter 1986   Type II diabetes mellitus (HCC)    Past Surgical History:  Procedure Laterality Date   ABDOMINAL HYSTERECTOMY  1999   w/BSO   ACHILLES TENDON LENGTHENING Right 11/23/2014   ACHILLES TENDON LENGTHENING Right 11/23/2014   Procedure: RIGHT ACHILLES PERCUTANEOUS TENDON LENGTHENING;  Surgeon: Amada Backer, MD;  Location: [facility];  Service: Orthopedics;  Laterality: Right;   APPENDECTOMY  1963   ARTHRODESIS Right 11/23/2014   mid foot   CARDIAC CATHETERIZATION  08/2004   FOOT ARTHRODESIS Right 11/23/2014   Procedure: RIGHT MID FOOT ARTHRODESIS;  Surgeon: Amada Backer, MD;  Location: [facility];  Service: Orthopedics;  Laterality: Right;   METATARSAL OSTEOTOMY Right 11/23/2014   Procedure: RIGHT MID FOOT OSTEOTOMY;  Surgeon: Amada Backer, MD;  Location: [facility];  Service: Orthopedics;  Laterality: Right;   ORIF ANKLE FRACTURE Left 05/16/2021   Procedure: OPEN REDUCTION INTERNAL FIXATION (ORIF) Left ankle lateral malleolus, possible deltoid ligament repair;  Surgeon: Amada Backer, MD;  Location: [facility];  Service: Orthopedics;  Laterality: Left;   OSTEOTOMY Right  11/23/2014   mid foot   PARTIAL THYMECTOMY  1986   ? side   TONSILLECTOMY  1954   Patient Active Problem List   Diagnosis Date Noted   Closed low lateral malleolus fracture 05/16/2021   Gout 05/16/2021   Hyperlipemia 05/16/2021   Hypertension 05/16/2021   Type II diabetes mellitus (HCC) 05/16/2021   Spinal stenosis of lumbar region 03/10/2019   Cervical myelopathy (HCC) 03/10/2019   Peripheral neuropathy 02/02/2019   Gait abnormality 02/02/2019   Urinary urgency 02/02/2019   Chronic bilateral low back pain without sciatica 02/02/2019   DOE (dyspnea on exertion) 10/05/2017   Bruit of right carotid artery 10/05/2017   Coronary artery calcification seen on CT scan 10/05/2017   Chest pain 10/04/2017   Aortic atherosclerosis (HCC) 10/04/2017   Charcot foot due to diabetes mellitus (HCC) 11/23/2014    PCP: Darnelle Elders, PA  REFERRING PROVIDER: Clotilde Danker, PA  REFERRING DIAG: poor balance, weakness, difficulty walking  THERAPY DIAG:  Muscle weakness (generalized)  Unsteady gait  Difficulty in walking, not elsewhere classified  Rationale for Evaluation and Treatment: Rehabilitation  ONSET DATE: 09/22/23  SUBJECTIVE:   SUBJECTIVE STATEMENT: Doing alright. My back is hurting today.    Had a fall in November 2022 and sustained a left Malleolar fracture with ORIF.  She was suffering from neuropathy,  had just started to use a walker but prior to that did not use a device.  We saw her April 2024 and she progressed well with walking, strength and function.  She returns today due to weakness, difficulty walking, decreased endurance, and unsteady gait.  Denies any recent falls.  She reports that she is walking much slower and is a little more unsteady, feels weak, reports that she is taking Lyrica  and Cymbalta.  PERTINENT HISTORY: See above PAIN:  Are you having pain? Yes: NPRS scale: 3/10 Pain location: low back Pain description: tight, stabbing Aggravating factors: being on  feet longer, twisting pain pain up to 6/10 Relieving factors: rest pain will go to 0/10  PRECAUTIONS: Fall  WEIGHT BEARING RESTRICTIONS: No  FALLS:  Has patient fallen in last 6 months? No, just unsteady  LIVING ENVIRONMENT: Lives with: lives alone Lives in: House/apartment Stairs: Yes: Internal: 12 steps; can reach both Has following equipment at home: Otho Blitz - 2 wheeled, Tour manager, and Grab bars  OCCUPATION: retired  PLOF: Independent with household mobility with device and Needs assistance with homemaking has two daughters that check on her every other day, she does some cooking, does some housework  PATIENT GOALS: may go to the beach in the fall, stay living at home independently  NEXT MD VISIT: none scheduled  OBJECTIVE:   DIAGNOSTIC FINDINGS: none  COGNITION: Overall cognitive status: Within functional limits for tasks assessed     SENSATION: Mid shin down very limited sensation light touch can feel some deeper touch  EDEMA:  Very mild at the ankles  MUSCLE LENGTH: Mild tightness in the HS  POSTURE: rounded shoulders and forward head  PALPATION: Tight and tender in the lumbar paraspinals  LOWER EXTREMITY ROM:  Active ROM Right eval Left eval  Hip flexion 80 80  Hip extension    Hip abduction    Hip adduction    Hip internal rotation    Hip external rotation    Knee flexion    Knee extension 0 0  Ankle dorsiflexion 0 0  Ankle plantarflexion    Ankle inversion    Ankle eversion     (Blank rows = not tested)  LOWER EXTREMITY MMT:  MMT Right eval Left eval  Hip flexion 4 4-  Hip extension 4- 4-  Hip abduction 4- 4-  Hip adduction    Hip internal rotation    Hip external rotation    Knee flexion 4- 4-  Knee extension 4 34  Ankle dorsiflexion 1+ 0  Ankle plantarflexion 1 1  Ankle inversion 1 0  Ankle eversion 0 0   (Blank rows = not tested) FUNCTIONAL TESTS:  TUG: 37 seconds with FWW at evaluation walk test with CGA and  FWW = 158 feet at evaluation, had to stop due to fatigue Unable to stand without holding onto something  GAIT: Distance walked: 158 feet Assistive device utilized: Walker - 2 wheeled Level of assistance: SBA to start and then needs CGA Comments: foot drop bilaterally, decreased foot control, some hip drop on the left side, when fatigued poor safety with transfers   TODAY'S TREATMENT:  DATE:   11/26/23 NuStep L5x74mins  Walk outside out front door to sign and back in Pball flexion x10 Standing with legs against table rows and ext 2x10 Lat pull down 20# 2x10  blackTB flexion 2x10  11/23/23 3 min walk test- able to do 358ft in 2 min 40s  NuStep L5x75mins Leg ext 10# 2x10 HS curls 25# 2x10 Side step in bars  Mini squats in bars x10   11/19/23 NuStep L5x77mins  TUG- 32s  Lat pull down 20# 2x10  Feet on pball rotations, knees to chest x10 Bridges x10 Passive LE stretching- HS, glutes, piriformis Isometric abs x10 3s holds  AB roll small crunches x10 SLR x10, SLR 1.5# x10   11/16/23 Nustep level 5 x 7 minutes LEg curls 20# 2x10 Leg extension 10# 2x12 Lat pulls 20# 2x12 Chest press 10# 2x12 Leg press 20# Seated voilley ball Seated green tband clamshells  11/12/23 NuStep L5x50mins  3# box taps 6"  Fitter pushes 2 blue bands 2x10  Step up 4" with 2 hand rails x5  Lat pull down 20# 2x10 Chest press 10# 2x10 Chest fly with cable 5# x10    11/09/23 Walking outdoors out front door and into back door- one curb  Leg ext 10# 2x10  HS curls 20# 2x10  Lat pull down 20# 2x10 Chest press 5# 2x10  Seated flexion with black band 2x10 Seated volleyball hits    11/05/23 NuStep L5x82mins  Walking outdoors- down one curbs and around building  [employer] taps  Marching with 2# 2x10 Hip abduction 2# 2x10 LAQ 2# 2x10    11/02/23 Nustep level 5 x 6 minutes Gait  around half the back parking [place] with one sitting rest break 4" step ups with CGA Seated volley ball  PATIENT EDUCATION:  Education details: POC Person educated: Patient Education method: Explanation Education comprehension: verbalized understanding  HOME EXERCISE PROGRAM: TBD  ASSESSMENT:  CLINICAL IMPRESSION: Patient returns with ongoing complaints of unsteadiness and poor balance. She also has back pain today. We tried a walk outdoors but due to poor foot clearance (her L foot caught 3x) and fear of tripping we decided to turn around halfway through. Did some exercises for her back today.   Patient is a 78 y.o. female who was seen today for physical therapy evaluation and treatment for weakness, unsteady gait, poor balance.  She has minimal movements at the ankles and minimal sensation to the ankles and lower legs.  She was very slow with the TUG and slow with the 3 minute walk test.  Due to the neuropathy her balance is very poor, cannot stand without holding onto something, is starting to have issues with her hands, she had not had any falls but is very careful  OBJECTIVE IMPAIRMENTS: Abnormal gait, cardiopulmonary status limiting activity, decreased activity tolerance, decreased balance, decreased coordination, decreased endurance, decreased mobility, difficulty walking, decreased ROM, decreased strength, impaired flexibility, impaired sensation, and pain.   REHAB POTENTIAL: Good  CLINICAL DECISION MAKING: Stable/uncomplicated  EVALUATION COMPLEXITY: Low   GOALS: Goals reviewed with patient? Yes  SHORT TERM GOALS: Target date: 11/11/23 Independent with initial HEP Goal status: progressing 11/16/23  LONG TERM GOALS: Target date: 02/11/24  Independent with advanced HEP or gym program Goal status: INITIAL  2.  Decrease TUG time to 24 seconds Goal status: IN PROGRESS 32s 11/19/23  3.  Walk 300 feet in 3 minute walk test Goal status: MET 11/23/23  4.  Be able to negotiate  stairs and curbs with SBA  with one at a time and a handrail Goal status: INITIAL  5.  Report able to warm up a meal without sitting Goal status: INITIAL   PLAN:  PT FREQUENCY: 1-2x/week  PT DURATION: 12 weeks  PLANNED INTERVENTIONS: Therapeutic exercises, Therapeutic activity, Neuromuscular re-education, Balance training, Gait training, Patient/Family education, Self Care, Joint mobilization, and Manual therapy  PLAN FOR NEXT SESSION: add exercises, balance, HEP  Donavon Fudge, PT 11/26/2023, 6:02 PM

## 2023-12-05 DIAGNOSIS — N183 Chronic kidney disease, stage 3 unspecified: Secondary | ICD-10-CM | POA: Diagnosis not present

## 2023-12-05 DIAGNOSIS — E7800 Pure hypercholesterolemia, unspecified: Secondary | ICD-10-CM | POA: Diagnosis not present

## 2023-12-05 DIAGNOSIS — E1143 Type 2 diabetes mellitus with diabetic autonomic (poly)neuropathy: Secondary | ICD-10-CM | POA: Diagnosis not present

## 2023-12-05 DIAGNOSIS — I2510 Atherosclerotic heart disease of native coronary artery without angina pectoris: Secondary | ICD-10-CM | POA: Diagnosis not present

## 2023-12-05 DIAGNOSIS — M810 Age-related osteoporosis without current pathological fracture: Secondary | ICD-10-CM | POA: Diagnosis not present

## 2023-12-05 DIAGNOSIS — I10 Essential (primary) hypertension: Secondary | ICD-10-CM | POA: Diagnosis not present

## 2023-12-07 ENCOUNTER — Ambulatory Visit

## 2023-12-08 ENCOUNTER — Ambulatory Visit

## 2023-12-10 ENCOUNTER — Ambulatory Visit: Admitting: Physical Therapy

## 2023-12-11 NOTE — Therapy (Signed)
 OUTPATIENT PHYSICAL THERAPY LOWER EXTREMITY TREATMENT  Progress Note Reporting Period 10/12/23 to 12/14/23  See note below for Objective Data and Assessment of Progress/Goals.     Patient Name: Pamela Snyder MRN: [id number] DOB:[birth year], 78 y.o., female Today's Date: 12/14/2023  END OF SESSION:  PT End of Session - 12/14/23 1446     Visit Number 10    Date for PT Re-Evaluation 01/11/24    Authorization Type Medicare    PT Start Time 1445    PT Stop Time 1530    PT Time Calculation (min) 45 min    Activity Tolerance Patient tolerated treatment well    Behavior During Therapy [facility] for tasks assessed/performed                     Past Medical History:  Diagnosis Date   Anxiety    Arthritis    "right foot; spine; hands" (11/23/2014)   Charcot foot due to diabetes mellitus (HCC)    Depression    GERD (gastroesophageal reflux disease)    Gout    Hyperlipemia    Hypertension    Migraine    hx   Neuropathy    Numbness    Thyroid  goiter 1986   Type II diabetes mellitus (HCC)    Past Surgical History:  Procedure Laterality Date   ABDOMINAL HYSTERECTOMY  1999   w/BSO   ACHILLES TENDON LENGTHENING Right 11/23/2014   ACHILLES TENDON LENGTHENING Right 11/23/2014   Procedure: RIGHT ACHILLES PERCUTANEOUS TENDON LENGTHENING;  Surgeon: Amada Backer, MD;  Location: [facility] OR;  Service: Orthopedics;  Laterality: Right;   APPENDECTOMY  1963   ARTHRODESIS Right 11/23/2014   mid foot   CARDIAC CATHETERIZATION  08/2004   FOOT ARTHRODESIS Right 11/23/2014   Procedure: RIGHT MID FOOT ARTHRODESIS;  Surgeon: Amada Backer, MD;  Location: [facility] OR;  Service: Orthopedics;  Laterality: Right;   METATARSAL OSTEOTOMY Right 11/23/2014   Procedure: RIGHT MID FOOT OSTEOTOMY;  Surgeon: Amada Backer, MD;  Location: [facility] OR;  Service: Orthopedics;  Laterality: Right;   ORIF ANKLE FRACTURE Left 05/16/2021   Procedure: OPEN REDUCTION INTERNAL FIXATION (ORIF) Left ankle lateral malleolus, possible  deltoid ligament repair;  Surgeon: Amada Backer, MD;  Location: [facility] OR;  Service: Orthopedics;  Laterality: Left;   OSTEOTOMY Right 11/23/2014   mid foot   PARTIAL THYMECTOMY  1986   ? side   TONSILLECTOMY  1954   Patient Active Problem List   Diagnosis Date Noted   Closed low lateral malleolus fracture 05/16/2021   Gout 05/16/2021   Hyperlipemia 05/16/2021   Hypertension 05/16/2021   Type II diabetes mellitus (HCC) 05/16/2021   Spinal stenosis of lumbar region 03/10/2019   Cervical myelopathy (HCC) 03/10/2019   Peripheral neuropathy 02/02/2019   Gait abnormality 02/02/2019   Urinary urgency 02/02/2019   Chronic bilateral low back pain without sciatica 02/02/2019   DOE (dyspnea on exertion) 10/05/2017   Bruit of right carotid artery 10/05/2017   Coronary artery calcification seen on CT scan 10/05/2017   Chest pain 10/04/2017   Aortic atherosclerosis (HCC) 10/04/2017   Charcot foot due to diabetes mellitus (HCC) 11/23/2014    PCP: Darnelle Elders, PA  REFERRING PROVIDER: Clotilde Danker, PA  REFERRING DIAG: poor balance, weakness, difficulty walking  THERAPY DIAG:  Muscle weakness (generalized)  Unsteady gait  Difficulty in walking, not elsewhere classified  Rationale for Evaluation and Treatment: Rehabilitation  ONSET DATE: 09/22/23  SUBJECTIVE:   SUBJECTIVE STATEMENT: I got sick, I am still feeling wheezy.  Have not done much since.    Had a fall in November 2022 and sustained a left Malleolar fracture with ORIF.  She was suffering from neuropathy, had just started to use a walker but prior to that did not use a device.  We saw her April 2024 and she progressed well with walking, strength and function.  She returns today due to weakness, difficulty walking, decreased endurance, and unsteady gait.  Denies any recent falls.  She reports that she is walking much slower and is a little more unsteady, feels weak, reports that she is taking Lyrica  and Cymbalta.  PERTINENT  HISTORY: See above PAIN:  Are you having pain? Yes: NPRS scale: 3/10 Pain location: low back Pain description: tight, stabbing Aggravating factors: being on feet longer, twisting pain pain up to 6/10 Relieving factors: rest pain will go to 0/10  PRECAUTIONS: Fall  WEIGHT BEARING RESTRICTIONS: No  FALLS:  Has patient fallen in last 6 months? No, just unsteady  LIVING ENVIRONMENT: Lives with: lives alone Lives in: House/apartment Stairs: Yes: Internal: 12 steps; can reach both Has following equipment at home: Otho Blitz - 2 wheeled, Tour manager, and Grab bars  OCCUPATION: retired  PLOF: Independent with household mobility with device and Needs assistance with homemaking has two daughters that check on her every other day, she does some cooking, does some housework  PATIENT GOALS: may go to the beach in the fall, stay living at home independently  NEXT MD VISIT: none scheduled  OBJECTIVE:   DIAGNOSTIC FINDINGS: none  COGNITION: Overall cognitive status: Within functional limits for tasks assessed     SENSATION: Mid shin down very limited sensation light touch can feel some deeper touch  EDEMA:  Very mild at the ankles  MUSCLE LENGTH: Mild tightness in the HS  POSTURE: rounded shoulders and forward head  PALPATION: Tight and tender in the lumbar paraspinals  LOWER EXTREMITY ROM:  Active ROM Right eval Left eval  Hip flexion 80 80  Hip extension    Hip abduction    Hip adduction    Hip internal rotation    Hip external rotation    Knee flexion    Knee extension 0 0  Ankle dorsiflexion 0 0  Ankle plantarflexion    Ankle inversion    Ankle eversion     (Blank rows = not tested)  LOWER EXTREMITY MMT:  MMT Right eval Left eval  Hip flexion 4 4-  Hip extension 4- 4-  Hip abduction 4- 4-  Hip adduction    Hip internal rotation    Hip external rotation    Knee flexion 4- 4-  Knee extension 4 34  Ankle dorsiflexion 1+ 0  Ankle plantarflexion 1 1   Ankle inversion 1 0  Ankle eversion 0 0   (Blank rows = not tested) FUNCTIONAL TESTS:  TUG: 37 seconds with FWW at evaluation walk test with CGA and FWW = 158 feet at evaluation, had to stop due to fatigue Unable to stand without holding onto something  GAIT: Distance walked: 158 feet Assistive device utilized: Walker - 2 wheeled Level of assistance: SBA to start and then needs CGA Comments: foot drop bilaterally, decreased foot control, some hip drop on the left side, when fatigued poor safety with transfers   TODAY'S TREATMENT:  DATE:   12/14/23 NuStep L5x40mins Box taps 4" with RW  STS w/RW, 2x10 some use of table against legs  OHP 3# 2x10 RUE, 2x5 LUE  HS curls 25# 2x10 Leg ext 10# 2x10 Lat pull down 20# 2x10    11/26/23 NuStep L5x70mins  Walk outside out front door to sign and back in Pball flexion x10 Standing with legs against table rows and ext 2x10 Lat pull down 20# 2x10  blackTB flexion 2x10  11/23/23 3 min walk test- able to do 345ft in 2 min 40s  NuStep L5x46mins Leg ext 10# 2x10 HS curls 25# 2x10 Side step in bars  Mini squats in bars x10   11/19/23 NuStep L5x45mins  TUG- 32s  Lat pull down 20# 2x10  Feet on pball rotations, knees to chest x10 Bridges x10 Passive LE stretching- HS, glutes, piriformis Isometric abs x10 3s holds  AB roll small crunches x10 SLR x10, SLR 1.5# x10   11/16/23 Nustep level 5 x 7 minutes LEg curls 20# 2x10 Leg extension 10# 2x12 Lat pulls 20# 2x12 Chest press 10# 2x12 Leg press 20# Seated voilley ball Seated green tband clamshells  11/12/23 NuStep L5x89mins  3# box taps 6"  Fitter pushes 2 blue bands 2x10  Step up 4" with 2 hand rails x5  Lat pull down 20# 2x10 Chest press 10# 2x10 Chest fly with cable 5# x10    11/09/23 Walking outdoors out front door and into back door- one curb  Leg  ext 10# 2x10  HS curls 20# 2x10  Lat pull down 20# 2x10 Chest press 5# 2x10  Seated flexion with black band 2x10 Seated volleyball hits    11/05/23 NuStep L5x52mins  Walking outdoors- down one curbs and around building  [place] taps  Marching with 2# 2x10 Hip abduction 2# 2x10 LAQ 2# 2x10    11/02/23 Nustep level 5 x 6 minutes Gait around half the back parking [place] with one sitting rest break 4" step ups with CGA Seated volley ball  PATIENT EDUCATION:  Education details: POC Person educated: Patient Education method: Explanation Education comprehension: verbalized understanding  HOME EXERCISE PROGRAM: TBD  ASSESSMENT:  CLINICAL IMPRESSION: Patient returns after two weeks of no therapy.   She still has unsteadiness and poor balance. We focused on mostly LE strengthening and getting her back into her PT routine.   Patient is a 78 y.o. female who was seen today for physical therapy evaluation and treatment for weakness, unsteady gait, poor balance.  She has minimal movements at the ankles and minimal sensation to the ankles and lower legs.  She was very slow with the TUG and slow with the 3 minute walk test.  Due to the neuropathy her balance is very poor, cannot stand without holding onto something, is starting to have issues with her hands, she had not had any falls but is very careful  OBJECTIVE IMPAIRMENTS: Abnormal gait, cardiopulmonary status limiting activity, decreased activity tolerance, decreased balance, decreased coordination, decreased endurance, decreased mobility, difficulty walking, decreased ROM, decreased strength, impaired flexibility, impaired sensation, and pain.   REHAB POTENTIAL: Good  CLINICAL DECISION MAKING: Stable/uncomplicated  EVALUATION COMPLEXITY: Low   GOALS: Goals reviewed with patient? Yes  SHORT TERM GOALS: Target date: 11/11/23 Independent with initial HEP Goal status: progressing 11/16/23, not doing much at home 12/14/23  LONG TERM GOALS:  Target date: 02/11/24  Independent with advanced HEP or gym program Goal status: INITIAL  2.  Decrease TUG time to 24 seconds Goal status: REVISED Will  decrease TUG to < 20s    32s 11/19/23, 24s MET 12/14/23  3.  Walk 300 feet in 3 minute walk test Goal status: MET 11/23/23  4.  Be able to negotiate stairs and curbs with SBA with one at a time and a handrail Goal status: IN PROGRESS modA still needed 12/14/23  5.  Report able to warm up a meal without sitting Goal status: MET 12/14/23    PLAN:  PT FREQUENCY: 1-2x/week  PT DURATION: 12 weeks  PLANNED INTERVENTIONS: Therapeutic exercises, Therapeutic activity, Neuromuscular re-education, Balance training, Gait training, Patient/Family education, Self Care, Joint mobilization, and Manual therapy  PLAN FOR NEXT SESSION: add exercises, balance, HEP  Darlena Koval, PT 12/14/2023, 3:28 PM

## 2023-12-14 ENCOUNTER — Ambulatory Visit: Attending: Neurology

## 2023-12-14 DIAGNOSIS — R262 Difficulty in walking, not elsewhere classified: Secondary | ICD-10-CM | POA: Insufficient documentation

## 2023-12-14 DIAGNOSIS — M6281 Muscle weakness (generalized): Secondary | ICD-10-CM | POA: Insufficient documentation

## 2023-12-14 DIAGNOSIS — R2681 Unsteadiness on feet: Secondary | ICD-10-CM | POA: Diagnosis not present

## 2023-12-17 ENCOUNTER — Ambulatory Visit

## 2023-12-17 DIAGNOSIS — M6281 Muscle weakness (generalized): Secondary | ICD-10-CM | POA: Diagnosis not present

## 2023-12-17 DIAGNOSIS — R262 Difficulty in walking, not elsewhere classified: Secondary | ICD-10-CM

## 2023-12-17 DIAGNOSIS — R2681 Unsteadiness on feet: Secondary | ICD-10-CM

## 2023-12-17 NOTE — Therapy (Signed)
 OUTPATIENT PHYSICAL THERAPY LOWER EXTREMITY TREATMENT    Patient Name: Pamela Snyder MRN: [id number] DOB:Dec 03, 1945, 78 y.o., female Today's Date: 12/17/2023  END OF SESSION:  PT End of Session - 12/17/23 1705     Visit Number 11    Date for PT Re-Evaluation 01/11/24    Authorization Type Medicare    PT Start Time 1705    PT Stop Time 1750    PT Time Calculation (min) 45 min    Activity Tolerance Patient tolerated treatment well    Behavior During Therapy [facility] for tasks assessed/performed                   Past Medical History:  Diagnosis Date   Anxiety    Arthritis    right foot; spine; hands (11/23/2014)   Charcot foot due to diabetes mellitus (HCC)    Depression    GERD (gastroesophageal reflux disease)    Gout    Hyperlipemia    Hypertension    Migraine    hx   Neuropathy    Numbness    Thyroid  goiter 1986   Type II diabetes mellitus (HCC)    Past Surgical History:  Procedure Laterality Date   ABDOMINAL HYSTERECTOMY  1999   w/BSO   ACHILLES TENDON LENGTHENING Right 11/23/2014   ACHILLES TENDON LENGTHENING Right 11/23/2014   Procedure: RIGHT ACHILLES PERCUTANEOUS TENDON LENGTHENING;  Surgeon: Amada Backer, MD;  Location: [facility] OR;  Service: Orthopedics;  Laterality: Right;   APPENDECTOMY  1963   ARTHRODESIS Right 11/23/2014   mid foot   CARDIAC CATHETERIZATION  08/2004   FOOT ARTHRODESIS Right 11/23/2014   Procedure: RIGHT MID FOOT ARTHRODESIS;  Surgeon: Amada Backer, MD;  Location: [facility] OR;  Service: Orthopedics;  Laterality: Right;   METATARSAL OSTEOTOMY Right 11/23/2014   Procedure: RIGHT MID FOOT OSTEOTOMY;  Surgeon: Amada Backer, MD;  Location: [facility];  Service: Orthopedics;  Laterality: Right;   ORIF ANKLE FRACTURE Left 05/16/2021   Procedure: OPEN REDUCTION INTERNAL FIXATION (ORIF) Left ankle lateral malleolus, possible deltoid ligament repair;  Surgeon: Amada Backer, MD;  Location: [facility];  Service: Orthopedics;  Laterality: Left;   OSTEOTOMY Right  11/23/2014   mid foot   PARTIAL THYMECTOMY  1986   ? side   TONSILLECTOMY  1954   Patient Active Problem List   Diagnosis Date Noted   Closed low lateral malleolus fracture 05/16/2021   Gout 05/16/2021   Hyperlipemia 05/16/2021   Hypertension 05/16/2021   Type II diabetes mellitus (HCC) 05/16/2021   Spinal stenosis of lumbar region 03/10/2019   Cervical myelopathy (HCC) 03/10/2019   Peripheral neuropathy 02/02/2019   Gait abnormality 02/02/2019   Urinary urgency 02/02/2019   Chronic bilateral low back pain without sciatica 02/02/2019   DOE (dyspnea on exertion) 10/05/2017   Bruit of right carotid artery 10/05/2017   Coronary artery calcification seen on CT scan 10/05/2017   Chest pain 10/04/2017   Aortic atherosclerosis (HCC) 10/04/2017   Charcot foot due to diabetes mellitus (HCC) 11/23/2014    PCP: Darnelle Elders, PA  REFERRING PROVIDER: Clotilde Danker, PA  REFERRING DIAG: poor balance, weakness, difficulty walking  THERAPY DIAG:  Muscle weakness (generalized)  Unsteady gait  Difficulty in walking, not elsewhere classified  Rationale for Evaluation and Treatment: Rehabilitation  ONSET DATE: 09/22/23  SUBJECTIVE:   SUBJECTIVE STATEMENT: I have had a strange 24hrs. I had to make adjustment in the day for some house stuff.    Had a fall in November 2022 and sustained a  left Malleolar fracture with ORIF.  She was suffering from neuropathy, had just started to use a walker but prior to that did not use a device.  We saw her April 2024 and she progressed well with walking, strength and function.  She returns today due to weakness, difficulty walking, decreased endurance, and unsteady gait.  Denies any recent falls.  She reports that she is walking much slower and is a little more unsteady, feels weak, reports that she is taking Lyrica  and Cymbalta.  PERTINENT HISTORY: See above PAIN:  Are you having pain? Yes: NPRS scale: 3/10 Pain location: low back Pain description:  tight, stabbing Aggravating factors: being on feet longer, twisting pain pain up to 6/10 Relieving factors: rest pain will go to 0/10  PRECAUTIONS: Fall  WEIGHT BEARING RESTRICTIONS: No  FALLS:  Has patient fallen in last 6 months? No, just unsteady  LIVING ENVIRONMENT: Lives with: lives alone Lives in: House/apartment Stairs: Yes: Internal: 12 steps; can reach both Has following equipment at home: Otho Blitz - 2 wheeled, Tour manager, and Grab bars  OCCUPATION: retired  PLOF: Independent with household mobility with device and Needs assistance with homemaking has two daughters that check on her every other day, she does some cooking, does some housework  PATIENT GOALS: may go to the beach in the fall, stay living at home independently  NEXT MD VISIT: none scheduled  OBJECTIVE:   DIAGNOSTIC FINDINGS: none  COGNITION: Overall cognitive status: Within functional limits for tasks assessed     SENSATION: Mid shin down very limited sensation light touch can feel some deeper touch  EDEMA:  Very mild at the ankles  MUSCLE LENGTH: Mild tightness in the HS  POSTURE: rounded shoulders and forward head  PALPATION: Tight and tender in the lumbar paraspinals  LOWER EXTREMITY ROM:  Active ROM Right eval Left eval  Hip flexion 80 80  Hip extension    Hip abduction    Hip adduction    Hip internal rotation    Hip external rotation    Knee flexion    Knee extension 0 0  Ankle dorsiflexion 0 0  Ankle plantarflexion    Ankle inversion    Ankle eversion     (Blank rows = not tested)  LOWER EXTREMITY MMT:  MMT Right eval Left eval  Hip flexion 4 4-  Hip extension 4- 4-  Hip abduction 4- 4-  Hip adduction    Hip internal rotation    Hip external rotation    Knee flexion 4- 4-  Knee extension 4 34  Ankle dorsiflexion 1+ 0  Ankle plantarflexion 1 1  Ankle inversion 1 0  Ankle eversion 0 0   (Blank rows = not tested) FUNCTIONAL TESTS:  TUG: 37 seconds with FWW  at evaluation walk test with CGA and FWW = 158 feet at evaluation, had to stop due to fatigue Unable to stand without holding onto something  GAIT: Distance walked: 158 feet Assistive device utilized: Walker - 2 wheeled Level of assistance: SBA to start and then needs CGA Comments: foot drop bilaterally, decreased foot control, some hip drop on the left side, when fatigued poor safety with transfers   TODAY'S TREATMENT:  DATE:   12/17/23 NuStep L5x34mins  Modified sit ups yellow ball 2x10 Standing marches 5#  Lateral step taps 4 with 5#  Volleyball  Lat pull down 25# 2x10 blackTB flexion 2x10  12/14/23 NuStep L5x11mins Box taps 4 with RW  STS w/RW, 2x10 some use of table against legs  OHP 3# 2x10 RUE, 2x5 LUE  HS curls 25# 2x10 Leg ext 10# 2x10 Lat pull down 20# 2x10    11/26/23 NuStep L5x62mins  Walk outside out front door to sign and back in Pball flexion x10 Standing with legs against table rows and ext 2x10 Lat pull down 20# 2x10  blackTB flexion 2x10  11/23/23 3 min walk test- able to do 318ft in 2 min 40s  NuStep L5x68mins Leg ext 10# 2x10 HS curls 25# 2x10 Side step in bars  Mini squats in bars x10   11/19/23 NuStep L5x65mins  TUG- 32s  Lat pull down 20# 2x10  Feet on pball rotations, knees to chest x10 Bridges x10 Passive LE stretching- HS, glutes, piriformis Isometric abs x10 3s holds  AB roll small crunches x10 SLR x10, SLR 1.5# x10   11/16/23 Nustep level 5 x 7 minutes LEg curls 20# 2x10 Leg extension 10# 2x12 Lat pulls 20# 2x12 Chest press 10# 2x12 Leg press 20# Seated voilley ball Seated green tband clamshells  11/12/23 NuStep L5x68mins  3# box taps 6  Fitter pushes 2 blue bands 2x10  Step up 4 with 2 hand rails x5  Lat pull down 20# 2x10 Chest press 10# 2x10 Chest fly with cable 5# x10    11/09/23 Walking  outdoors out front door and into back door- one curb  Leg ext 10# 2x10  HS curls 20# 2x10  Lat pull down 20# 2x10 Chest press 5# 2x10  Seated flexion with black band 2x10 Seated volleyball hits    11/05/23 NuStep L5x67mins  Walking outdoors- down one curbs and around building  [place] taps  Marching with 2# 2x10 Hip abduction 2# 2x10 LAQ 2# 2x10    11/02/23 Nustep level 5 x 6 minutes Gait around half the back parking [place] with one sitting rest break 4 step ups with CGA Seated volley ball  PATIENT EDUCATION:  Education details: POC Person educated: Patient Education method: Explanation Education comprehension: verbalized understanding  HOME EXERCISE PROGRAM: TBD  ASSESSMENT:  CLINICAL IMPRESSION: Patient returns with ongoing unsteadiness and poor balance. Continued to work on getting her back into a routine. She has not walked outdoors in a long time and feels weak.   Patient is a 78 y.o. female who was seen today for physical therapy evaluation and treatment for weakness, unsteady gait, poor balance.  She has minimal movements at the ankles and minimal sensation to the ankles and lower legs.  She was very slow with the TUG and slow with the 3 minute walk test.  Due to the neuropathy her balance is very poor, cannot stand without holding onto something, is starting to have issues with her hands, she had not had any falls but is very careful  OBJECTIVE IMPAIRMENTS: Abnormal gait, cardiopulmonary status limiting activity, decreased activity tolerance, decreased balance, decreased coordination, decreased endurance, decreased mobility, difficulty walking, decreased ROM, decreased strength, impaired flexibility, impaired sensation, and pain.   REHAB POTENTIAL: Good  CLINICAL DECISION MAKING: Stable/uncomplicated  EVALUATION COMPLEXITY: Low   GOALS: Goals reviewed with patient? Yes  SHORT TERM GOALS: Target date: 11/11/23 Independent with initial HEP Goal status: progressing  11/16/23, not doing much at home 12/14/23  LONG TERM GOALS: Target date: 02/11/24  Independent with advanced HEP or gym program Goal status: INITIAL  2.  Decrease TUG time to 24 seconds Goal status: REVISED Will decrease TUG to < 20s    32s 11/19/23, 24s MET 12/14/23  3.  Walk 300 feet in 3 minute walk test Goal status: MET 11/23/23  4.  Be able to negotiate stairs and curbs with SBA with one at a time and a handrail Goal status: IN PROGRESS modA still needed 12/14/23  5.  Report able to warm up a meal without sitting Goal status: MET 12/14/23    PLAN:  PT FREQUENCY: 1-2x/week  PT DURATION: 12 weeks  PLANNED INTERVENTIONS: Therapeutic exercises, Therapeutic activity, Neuromuscular re-education, Balance training, Gait training, Patient/Family education, Self Care, Joint mobilization, and Manual therapy  PLAN FOR NEXT SESSION: add exercises, balance, HEP  Donavon Fudge, PT 12/17/2023, 6:17 PM

## 2023-12-18 DIAGNOSIS — I10 Essential (primary) hypertension: Secondary | ICD-10-CM | POA: Diagnosis not present

## 2023-12-18 DIAGNOSIS — E1143 Type 2 diabetes mellitus with diabetic autonomic (poly)neuropathy: Secondary | ICD-10-CM | POA: Diagnosis not present

## 2023-12-18 DIAGNOSIS — N183 Chronic kidney disease, stage 3 unspecified: Secondary | ICD-10-CM | POA: Diagnosis not present

## 2023-12-18 DIAGNOSIS — I2510 Atherosclerotic heart disease of native coronary artery without angina pectoris: Secondary | ICD-10-CM | POA: Diagnosis not present

## 2023-12-21 ENCOUNTER — Ambulatory Visit: Admitting: Physical Therapy

## 2023-12-21 ENCOUNTER — Encounter: Payer: Self-pay | Admitting: Physical Therapy

## 2023-12-21 DIAGNOSIS — M6281 Muscle weakness (generalized): Secondary | ICD-10-CM | POA: Diagnosis not present

## 2023-12-21 DIAGNOSIS — R2681 Unsteadiness on feet: Secondary | ICD-10-CM

## 2023-12-21 DIAGNOSIS — R262 Difficulty in walking, not elsewhere classified: Secondary | ICD-10-CM

## 2023-12-21 NOTE — Therapy (Signed)
 OUTPATIENT PHYSICAL THERAPY LOWER EXTREMITY TREATMENT    Patient Name: Pamela Snyder MRN: [id number] DOB:[birth year], 78 y.o., female Today's Date: 12/21/2023  END OF SESSION:  PT End of Session - 12/21/23 1531     Visit Number 12    Date for PT Re-Evaluation 01/11/24    Authorization Type Medicare    PT Start Time 1529    PT Stop Time 1612    PT Time Calculation (min) 43 min    Activity Tolerance Patient tolerated treatment well    Behavior During Therapy [facility] for tasks assessed/performed                   Past Medical History:  Diagnosis Date   Anxiety    Arthritis    right foot; spine; hands (11/23/2014)   Charcot foot due to diabetes mellitus (HCC)    Depression    GERD (gastroesophageal reflux disease)    Gout    Hyperlipemia    Hypertension    Migraine    hx   Neuropathy    Numbness    Thyroid  goiter 1986   Type II diabetes mellitus (HCC)    Past Surgical History:  Procedure Laterality Date   ABDOMINAL HYSTERECTOMY  1999   w/BSO   ACHILLES TENDON LENGTHENING Right 11/23/2014   ACHILLES TENDON LENGTHENING Right 11/23/2014   Procedure: RIGHT ACHILLES PERCUTANEOUS TENDON LENGTHENING;  Surgeon: Amada Backer, MD;  Location: [facility] OR;  Service: Orthopedics;  Laterality: Right;   APPENDECTOMY  1963   ARTHRODESIS Right 11/23/2014   mid foot   CARDIAC CATHETERIZATION  08/2004   FOOT ARTHRODESIS Right 11/23/2014   Procedure: RIGHT MID FOOT ARTHRODESIS;  Surgeon: Amada Backer, MD;  Location: [facility] OR;  Service: Orthopedics;  Laterality: Right;   METATARSAL OSTEOTOMY Right 11/23/2014   Procedure: RIGHT MID FOOT OSTEOTOMY;  Surgeon: Amada Backer, MD;  Location: [facility] OR;  Service: Orthopedics;  Laterality: Right;   ORIF ANKLE FRACTURE Left 05/16/2021   Procedure: OPEN REDUCTION INTERNAL FIXATION (ORIF) Left ankle lateral malleolus, possible deltoid ligament repair;  Surgeon: Amada Backer, MD;  Location: [facility] OR;  Service: Orthopedics;  Laterality: Left;   OSTEOTOMY Right  11/23/2014   mid foot   PARTIAL THYMECTOMY  1986   ? side   TONSILLECTOMY  1954   Patient Active Problem List   Diagnosis Date Noted   Closed low lateral malleolus fracture 05/16/2021   Gout 05/16/2021   Hyperlipemia 05/16/2021   Hypertension 05/16/2021   Type II diabetes mellitus (HCC) 05/16/2021   Spinal stenosis of lumbar region 03/10/2019   Cervical myelopathy (HCC) 03/10/2019   Peripheral neuropathy 02/02/2019   Gait abnormality 02/02/2019   Urinary urgency 02/02/2019   Chronic bilateral low back pain without sciatica 02/02/2019   DOE (dyspnea on exertion) 10/05/2017   Bruit of right carotid artery 10/05/2017   Coronary artery calcification seen on CT scan 10/05/2017   Chest pain 10/04/2017   Aortic atherosclerosis (HCC) 10/04/2017   Charcot foot due to diabetes mellitus (HCC) 11/23/2014    PCP: Darnelle Elders, PA  REFERRING PROVIDER: Clotilde Danker, PA  REFERRING DIAG: poor balance, weakness, difficulty walking  THERAPY DIAG:  Muscle weakness (generalized)  Unsteady gait  Difficulty in walking, not elsewhere classified  Rationale for Evaluation and Treatment: Rehabilitation  ONSET DATE: 09/22/23  SUBJECTIVE:   SUBJECTIVE STATEMENT: Doing okay, just feel weak, no falls   Had a fall in November 2022 and sustained a left Malleolar fracture with ORIF.  She was suffering from neuropathy, had  just started to use a walker but prior to that did not use a device.  We saw her April 2024 and she progressed well with walking, strength and function.  She returns today due to weakness, difficulty walking, decreased endurance, and unsteady gait.  Denies any recent falls.  She reports that she is walking much slower and is a little more unsteady, feels weak, reports that she is taking Lyrica  and Cymbalta.  PERTINENT HISTORY: See above PAIN:  Are you having pain? Yes: NPRS scale: 3/10 Pain location: low back Pain description: tight, stabbing Aggravating factors: being on feet  longer, twisting pain pain up to 6/10 Relieving factors: rest pain will go to 0/10  PRECAUTIONS: Fall  WEIGHT BEARING RESTRICTIONS: No  FALLS:  Has patient fallen in last 6 months? No, just unsteady  LIVING ENVIRONMENT: Lives with: lives alone Lives in: House/apartment Stairs: Yes: Internal: 12 steps; can reach both Has following equipment at home: Otho Blitz - 2 wheeled, Tour manager, and Grab bars  OCCUPATION: retired  PLOF: Independent with household mobility with device and Needs assistance with homemaking has two daughters that check on her every other day, she does some cooking, does some housework  PATIENT GOALS: may go to the beach in the fall, stay living at home independently  NEXT MD VISIT: none scheduled  OBJECTIVE:   DIAGNOSTIC FINDINGS: none  COGNITION: Overall cognitive status: Within functional limits for tasks assessed     SENSATION: Mid shin down very limited sensation light touch can feel some deeper touch  EDEMA:  Very mild at the ankles  MUSCLE LENGTH: Mild tightness in the HS  POSTURE: rounded shoulders and forward head  PALPATION: Tight and tender in the lumbar paraspinals  LOWER EXTREMITY ROM:  Active ROM Right eval Left eval  Hip flexion 80 80  Hip extension    Hip abduction    Hip adduction    Hip internal rotation    Hip external rotation    Knee flexion    Knee extension 0 0  Ankle dorsiflexion 0 0  Ankle plantarflexion    Ankle inversion    Ankle eversion     (Blank rows = not tested)  LOWER EXTREMITY MMT:  MMT Right eval Left eval  Hip flexion 4 4-  Hip extension 4- 4-  Hip abduction 4- 4-  Hip adduction    Hip internal rotation    Hip external rotation    Knee flexion 4- 4-  Knee extension 4 34  Ankle dorsiflexion 1+ 0  Ankle plantarflexion 1 1  Ankle inversion 1 0  Ankle eversion 0 0   (Blank rows = not tested) FUNCTIONAL TESTS:  TUG: 37 seconds with FWW at evaluation walk test with CGA and FWW =  158 feet at evaluation, had to stop due to fatigue Unable to stand without holding onto something  GAIT: Distance walked: 158 feet Assistive device utilized: Walker - 2 wheeled Level of assistance: SBA to start and then needs CGA Comments: foot drop bilaterally, decreased foot control, some hip drop on the left side, when fatigued poor safety with transfers   TODAY'S TREATMENT:  DATE:   12/21/23 Nustep level 5 x 6 minutes 10# chest press 20# lats Black tband back flexion 20# leg press Partial sit ups 5# marches 5# hip abduction 5# single arm row and extension holding walker Green tband clamshells 3# LAQ Seated 3# marches Sidestepping 4# biceps  12/17/23 NuStep L5x6mins  Modified sit ups yellow ball 2x10 Standing marches 5#  Lateral step taps 4 with 5#  Volleyball  Lat pull down 25# 2x10 blackTB flexion 2x10  12/14/23 NuStep L5x83mins Box taps 4 with RW  STS w/RW, 2x10 some use of table against legs  OHP 3# 2x10 RUE, 2x5 LUE  HS curls 25# 2x10 Leg ext 10# 2x10 Lat pull down 20# 2x10    11/26/23 NuStep L5x83mins  Walk outside out front door to sign and back in Pball flexion x10 Standing with legs against table rows and ext 2x10 Lat pull down 20# 2x10  blackTB flexion 2x10  11/23/23 3 min walk test- able to do 327ft in 2 min 40s  NuStep L5x44mins Leg ext 10# 2x10 HS curls 25# 2x10 Side step in bars  Mini squats in bars x10   11/19/23 NuStep L5x87mins  TUG- 32s  Lat pull down 20# 2x10  Feet on pball rotations, knees to chest x10 Bridges x10 Passive LE stretching- HS, glutes, piriformis Isometric abs x10 3s holds  AB roll small crunches x10 SLR x10, SLR 1.5# x10   11/16/23 Nustep level 5 x 7 minutes LEg curls 20# 2x10 Leg extension 10# 2x12 Lat pulls 20# 2x12 Chest press 10# 2x12 Leg press 20# Seated voilley ball Seated green  tband clamshells  11/12/23 NuStep L5x3mins  3# box taps 6  Fitter pushes 2 blue bands 2x10  Step up 4 with 2 hand rails x5  Lat pull down 20# 2x10 Chest press 10# 2x10 Chest fly with cable 5# x10    11/09/23 Walking outdoors out front door and into back door- one curb  Leg ext 10# 2x10  HS curls 20# 2x10  Lat pull down 20# 2x10 Chest press 5# 2x10  Seated flexion with black band 2x10 Seated volleyball hits    11/05/23 NuStep L5x53mins  Walking outdoors- down one curbs and around building  [employer] taps  Marching with 2# 2x10 Hip abduction 2# 2x10 LAQ 2# 2x10    11/02/23 Nustep level 5 x 6 minutes Gait around half the back parking [place] with one sitting rest break 4 step ups with CGA Seated volley ball  PATIENT EDUCATION:  Education details: POC Person educated: Patient Education method: Explanation Education comprehension: verbalized understanding  HOME EXERCISE PROGRAM: TBD  ASSESSMENT:  CLINICAL IMPRESSION: Patient really struggles with balance due to severe neuropathy.  Cannot stand without support.  She really struggles with the side stepping and requires mod A.  Has minimal ankle motions and really has difficulty lifting the left leg for placement on machines, needs help to do this  Patient is a 78 y.o. female who was seen today for physical therapy evaluation and treatment for weakness, unsteady gait, poor balance.  She has minimal movements at the ankles and minimal sensation to the ankles and lower legs.  She was very slow with the TUG and slow with the 3 minute walk test.  Due to the neuropathy her balance is very poor, cannot stand without holding onto something, is starting to have issues with her hands, she had not had any falls but is very careful  OBJECTIVE IMPAIRMENTS: Abnormal gait, cardiopulmonary status limiting activity, decreased activity tolerance,  decreased balance, decreased coordination, decreased endurance, decreased mobility, difficulty walking,  decreased ROM, decreased strength, impaired flexibility, impaired sensation, and pain.   REHAB POTENTIAL: Good  CLINICAL DECISION MAKING: Stable/uncomplicated  EVALUATION COMPLEXITY: Low   GOALS: Goals reviewed with patient? Yes  SHORT TERM GOALS: Target date: 11/11/23 Independent with initial HEP Goal status: progressing 11/16/23, not doing much at home 12/14/23  LONG TERM GOALS: Target date: 02/11/24  Independent with advanced HEP or gym program Goal status: INITIAL  2.  Decrease TUG time to 24 seconds Goal status: REVISED Will decrease TUG to < 20s    32s 11/19/23, 24s MET 12/14/23  3.  Walk 300 feet in 3 minute walk test Goal status: MET 11/23/23  4.  Be able to negotiate stairs and curbs with SBA with one at a time and a handrail Goal status: IN PROGRESS modA still needed 12/14/23  5.  Report able to warm up a meal without sitting Goal status: MET 12/14/23    PLAN:  PT FREQUENCY: 1-2x/week  PT DURATION: 12 weeks  PLANNED INTERVENTIONS: Therapeutic exercises, Therapeutic activity, Neuromuscular re-education, Balance training, Gait training, Patient/Family education, Self Care, Joint mobilization, and Manual therapy  PLAN FOR NEXT SESSION: add exercises, balance, HEP  Ayla Dunigan W, PT 12/21/2023, 3:31 PM

## 2023-12-24 ENCOUNTER — Ambulatory Visit

## 2023-12-24 DIAGNOSIS — R262 Difficulty in walking, not elsewhere classified: Secondary | ICD-10-CM

## 2023-12-24 DIAGNOSIS — M6281 Muscle weakness (generalized): Secondary | ICD-10-CM

## 2023-12-24 DIAGNOSIS — R2681 Unsteadiness on feet: Secondary | ICD-10-CM

## 2023-12-24 NOTE — Therapy (Signed)
 OUTPATIENT PHYSICAL THERAPY LOWER EXTREMITY TREATMENT    Patient Name: Pamela Snyder MRN: [id number] DOB:[birth year], 78 y.o., female Today's Date: 12/24/2023  END OF SESSION:  PT End of Session - 12/24/23 1717     Visit Number 13    Date for PT Re-Evaluation 01/11/24    Authorization Type Medicare    PT Start Time 1715    PT Stop Time 1800    PT Time Calculation (min) 45 min    Activity Tolerance Patient tolerated treatment well    Behavior During Therapy [facility] for tasks assessed/performed                    Past Medical History:  Diagnosis Date   Anxiety    Arthritis    right foot; spine; hands (11/23/2014)   Charcot foot due to diabetes mellitus (HCC)    Depression    GERD (gastroesophageal reflux disease)    Gout    Hyperlipemia    Hypertension    Migraine    hx   Neuropathy    Numbness    Thyroid  goiter 1986   Type II diabetes mellitus (HCC)    Past Surgical History:  Procedure Laterality Date   ABDOMINAL HYSTERECTOMY  1999   w/BSO   ACHILLES TENDON LENGTHENING Right 11/23/2014   ACHILLES TENDON LENGTHENING Right 11/23/2014   Procedure: RIGHT ACHILLES PERCUTANEOUS TENDON LENGTHENING;  Surgeon: Amada Backer, MD;  Location: [facility] OR;  Service: Orthopedics;  Laterality: Right;   APPENDECTOMY  1963   ARTHRODESIS Right 11/23/2014   mid foot   CARDIAC CATHETERIZATION  08/2004   FOOT ARTHRODESIS Right 11/23/2014   Procedure: RIGHT MID FOOT ARTHRODESIS;  Surgeon: Amada Backer, MD;  Location: [facility] OR;  Service: Orthopedics;  Laterality: Right;   METATARSAL OSTEOTOMY Right 11/23/2014   Procedure: RIGHT MID FOOT OSTEOTOMY;  Surgeon: Amada Backer, MD;  Location: [facility] OR;  Service: Orthopedics;  Laterality: Right;   ORIF ANKLE FRACTURE Left 05/16/2021   Procedure: OPEN REDUCTION INTERNAL FIXATION (ORIF) Left ankle lateral malleolus, possible deltoid ligament repair;  Surgeon: Amada Backer, MD;  Location: [facility] OR;  Service: Orthopedics;  Laterality: Left;   OSTEOTOMY  Right 11/23/2014   mid foot   PARTIAL THYMECTOMY  1986   ? side   TONSILLECTOMY  1954   Patient Active Problem List   Diagnosis Date Noted   Closed low lateral malleolus fracture 05/16/2021   Gout 05/16/2021   Hyperlipemia 05/16/2021   Hypertension 05/16/2021   Type II diabetes mellitus (HCC) 05/16/2021   Spinal stenosis of lumbar region 03/10/2019   Cervical myelopathy (HCC) 03/10/2019   Peripheral neuropathy 02/02/2019   Gait abnormality 02/02/2019   Urinary urgency 02/02/2019   Chronic bilateral low back pain without sciatica 02/02/2019   DOE (dyspnea on exertion) 10/05/2017   Bruit of right carotid artery 10/05/2017   Coronary artery calcification seen on CT scan 10/05/2017   Chest pain 10/04/2017   Aortic atherosclerosis (HCC) 10/04/2017   Charcot foot due to diabetes mellitus (HCC) 11/23/2014    PCP: Darnelle Elders, PA  REFERRING PROVIDER: Clotilde Danker, PA  REFERRING DIAG: poor balance, weakness, difficulty walking  THERAPY DIAG:  Muscle weakness (generalized)  Unsteady gait  Difficulty in walking, not elsewhere classified  Rationale for Evaluation and Treatment: Rehabilitation  ONSET DATE: 09/22/23  SUBJECTIVE:   SUBJECTIVE STATEMENT: Doing okay, just feel weak.   Had a fall in November 2022 and sustained a left Malleolar fracture with ORIF.  She was suffering from neuropathy, had just  started to use a walker but prior to that did not use a device.  We saw her April 2024 and she progressed well with walking, strength and function.  She returns today due to weakness, difficulty walking, decreased endurance, and unsteady gait.  Denies any recent falls.  She reports that she is walking much slower and is a little more unsteady, feels weak, reports that she is taking Lyrica  and Cymbalta.  PERTINENT HISTORY: See above PAIN:  Are you having pain? Yes: NPRS scale: 3/10 Pain location: low back Pain description: tight, stabbing Aggravating factors: being on feet  longer, twisting pain pain up to 6/10 Relieving factors: rest pain will go to 0/10  PRECAUTIONS: Fall  WEIGHT BEARING RESTRICTIONS: No  FALLS:  Has patient fallen in last 6 months? No, just unsteady  LIVING ENVIRONMENT: Lives with: lives alone Lives in: House/apartment Stairs: Yes: Internal: 12 steps; can reach both Has following equipment at home: Otho Blitz - 2 wheeled, Tour manager, and Grab bars  OCCUPATION: retired  PLOF: Independent with household mobility with device and Needs assistance with homemaking has two daughters that check on her every other day, she does some cooking, does some housework  PATIENT GOALS: may go to the beach in the fall, stay living at home independently  NEXT MD VISIT: none scheduled  OBJECTIVE:   DIAGNOSTIC FINDINGS: none  COGNITION: Overall cognitive status: Within functional limits for tasks assessed     SENSATION: Mid shin down very limited sensation light touch can feel some deeper touch  EDEMA:  Very mild at the ankles  MUSCLE LENGTH: Mild tightness in the HS  POSTURE: rounded shoulders and forward head  PALPATION: Tight and tender in the lumbar paraspinals  LOWER EXTREMITY ROM:  Active ROM Right eval Left eval  Hip flexion 80 80  Hip extension    Hip abduction    Hip adduction    Hip internal rotation    Hip external rotation    Knee flexion    Knee extension 0 0  Ankle dorsiflexion 0 0  Ankle plantarflexion    Ankle inversion    Ankle eversion     (Blank rows = not tested)  LOWER EXTREMITY MMT:  MMT Right eval Left eval  Hip flexion 4 4-  Hip extension 4- 4-  Hip abduction 4- 4-  Hip adduction    Hip internal rotation    Hip external rotation    Knee flexion 4- 4-  Knee extension 4 34  Ankle dorsiflexion 1+ 0  Ankle plantarflexion 1 1  Ankle inversion 1 0  Ankle eversion 0 0   (Blank rows = not tested) FUNCTIONAL TESTS:  TUG: 37 seconds with FWW at evaluation walk test with CGA and FWW =  158 feet at evaluation, had to stop due to fatigue Unable to stand without holding onto something  GAIT: Distance walked: 158 feet Assistive device utilized: Walker - 2 wheeled Level of assistance: SBA to start and then needs CGA Comments: foot drop bilaterally, decreased foot control, some hip drop on the left side, when fatigued poor safety with transfers   TODAY'S TREATMENT:  DATE:   12/24/23 NuStep L5x28mins  Single arm lat pull standing 10#- one hand on walker  Leg ext 10# 2x12 HS curls 25# 2x12 Standing on airex marching holding on the walker Bicep curls 3# 2x10  Alternating punches with 2# 2x10   12/21/23 Nustep level 5 x 6 minutes 10# chest press 20# lats Black tband back flexion 20# leg press Partial sit ups 5# marches 5# hip abduction 5# single arm row and extension holding walker Green tband clamshells 3# LAQ Seated 3# marches Sidestepping 4# biceps  12/17/23 NuStep L5x50mins  Modified sit ups yellow ball 2x10 Standing marches 5#  Lateral step taps 4 with 5#  Volleyball  Lat pull down 25# 2x10 blackTB flexion 2x10  12/14/23 NuStep L5x34mins Box taps 4 with RW  STS w/RW, 2x10 some use of table against legs  OHP 3# 2x10 RUE, 2x5 LUE  HS curls 25# 2x10 Leg ext 10# 2x10 Lat pull down 20# 2x10    11/26/23 NuStep L5x78mins  Walk outside out front door to sign and back in Pball flexion x10 Standing with legs against table rows and ext 2x10 Lat pull down 20# 2x10  blackTB flexion 2x10  11/23/23 3 min walk test- able to do 346ft in 2 min 40s  NuStep L5x28mins Leg ext 10# 2x10 HS curls 25# 2x10 Side step in bars  Mini squats in bars x10   11/19/23 NuStep L5x54mins  TUG- 32s  Lat pull down 20# 2x10  Feet on pball rotations, knees to chest x10 Bridges x10 Passive LE stretching- HS, glutes, piriformis Isometric abs x10 3s holds   AB roll small crunches x10 SLR x10, SLR 1.5# x10   11/16/23 Nustep level 5 x 7 minutes LEg curls 20# 2x10 Leg extension 10# 2x12 Lat pulls 20# 2x12 Chest press 10# 2x12 Leg press 20# Seated voilley ball Seated green tband clamshells  11/12/23 NuStep L5x30mins  3# box taps 6  Fitter pushes 2 blue bands 2x10  Step up 4 with 2 hand rails x5  Lat pull down 20# 2x10 Chest press 10# 2x10 Chest fly with cable 5# x10    11/09/23 Walking outdoors out front door and into back door- one curb  Leg ext 10# 2x10  HS curls 20# 2x10  Lat pull down 20# 2x10 Chest press 5# 2x10  Seated flexion with black band 2x10 Seated volleyball hits    11/05/23 NuStep L5x64mins  Walking outdoors- down one curbs and around building  [place] taps  Marching with 2# 2x10 Hip abduction 2# 2x10 LAQ 2# 2x10    11/02/23 Nustep level 5 x 6 minutes Gait around half the back parking [place] with one sitting rest break 4 step ups with CGA Seated volley ball  PATIENT EDUCATION:  Education details: POC Person educated: Patient Education method: Explanation Education comprehension: verbalized understanding  HOME EXERCISE PROGRAM: TBD  ASSESSMENT:  CLINICAL IMPRESSION: Patient continues to struggles with balance due to severe neuropathy. Limited standing tolerance with exercise due to pain in back. She is unable to do any standing without support. Worked on general strengthening as tolerated.    Patient is a 78 y.o. female who was seen today for physical therapy evaluation and treatment for weakness, unsteady gait, poor balance.  She has minimal movements at the ankles and minimal sensation to the ankles and lower legs.  She was very slow with the TUG and slow with the 3 minute walk test.  Due to the neuropathy her balance is very poor, cannot stand without holding  onto something, is starting to have issues with her hands, she had not had any falls but is very careful  OBJECTIVE IMPAIRMENTS: Abnormal gait,  cardiopulmonary status limiting activity, decreased activity tolerance, decreased balance, decreased coordination, decreased endurance, decreased mobility, difficulty walking, decreased ROM, decreased strength, impaired flexibility, impaired sensation, and pain.   REHAB POTENTIAL: Good  CLINICAL DECISION MAKING: Stable/uncomplicated  EVALUATION COMPLEXITY: Low   GOALS: Goals reviewed with patient? Yes  SHORT TERM GOALS: Target date: 11/11/23 Independent with initial HEP Goal status: progressing 11/16/23, not doing much at home 12/14/23  LONG TERM GOALS: Target date: 02/11/24  Independent with advanced HEP or gym program Goal status: INITIAL  2.  Decrease TUG time to 24 seconds Goal status: REVISED Will decrease TUG to < 20s    32s 11/19/23, 24s MET 12/14/23  3.  Walk 300 feet in 3 minute walk test Goal status: MET 11/23/23  4.  Be able to negotiate stairs and curbs with SBA with one at a time and a handrail Goal status: IN PROGRESS modA still needed 12/14/23  5.  Report able to warm up a meal without sitting Goal status: MET 12/14/23    PLAN:  PT FREQUENCY: 1-2x/week  PT DURATION: 12 weeks  PLANNED INTERVENTIONS: Therapeutic exercises, Therapeutic activity, Neuromuscular re-education, Balance training, Gait training, Patient/Family education, Self Care, Joint mobilization, and Manual therapy  PLAN FOR NEXT SESSION: add exercises, balance, HEP  Donavon Fudge, PT 12/24/2023, 5:58 PM

## 2023-12-28 ENCOUNTER — Encounter: Payer: Self-pay | Admitting: Physical Therapy

## 2023-12-28 ENCOUNTER — Ambulatory Visit: Admitting: Physical Therapy

## 2023-12-28 DIAGNOSIS — R262 Difficulty in walking, not elsewhere classified: Secondary | ICD-10-CM

## 2023-12-28 DIAGNOSIS — R2681 Unsteadiness on feet: Secondary | ICD-10-CM

## 2023-12-28 DIAGNOSIS — M6281 Muscle weakness (generalized): Secondary | ICD-10-CM | POA: Diagnosis not present

## 2023-12-28 NOTE — Therapy (Signed)
 OUTPATIENT PHYSICAL THERAPY LOWER EXTREMITY TREATMENT    Patient Name: Pamela Snyder MRN: [id number] DOB:Jun 01, 1946, 78 y.o., female Today's Date: 12/28/2023  END OF SESSION:  PT End of Session - 12/28/23 1532     Visit Number 14    Date for PT Re-Evaluation 01/11/24    Authorization Type Medicare    PT Start Time 1529    PT Stop Time 1614    PT Time Calculation (min) 45 min    Activity Tolerance Patient tolerated treatment well    Behavior During Therapy [facility] for tasks assessed/performed                    Past Medical History:  Diagnosis Date   Anxiety    Arthritis    right foot; spine; hands (11/23/2014)   Charcot foot due to diabetes mellitus (HCC)    Depression    GERD (gastroesophageal reflux disease)    Gout    Hyperlipemia    Hypertension    Migraine    hx   Neuropathy    Numbness    Thyroid  goiter 1986   Type II diabetes mellitus (HCC)    Past Surgical History:  Procedure Laterality Date   ABDOMINAL HYSTERECTOMY  1999   w/BSO   ACHILLES TENDON LENGTHENING Right 11/23/2014   ACHILLES TENDON LENGTHENING Right 11/23/2014   Procedure: RIGHT ACHILLES PERCUTANEOUS TENDON LENGTHENING;  Surgeon: Norleen Armor, MD;  Location: [facility] OR;  Service: Orthopedics;  Laterality: Right;   APPENDECTOMY  1963   ARTHRODESIS Right 11/23/2014   mid foot   CARDIAC CATHETERIZATION  08/2004   FOOT ARTHRODESIS Right 11/23/2014   Procedure: RIGHT MID FOOT ARTHRODESIS;  Surgeon: Norleen Armor, MD;  Location: [facility] OR;  Service: Orthopedics;  Laterality: Right;   METATARSAL OSTEOTOMY Right 11/23/2014   Procedure: RIGHT MID FOOT OSTEOTOMY;  Surgeon: Norleen Armor, MD;  Location: [facility] OR;  Service: Orthopedics;  Laterality: Right;   ORIF ANKLE FRACTURE Left 05/16/2021   Procedure: OPEN REDUCTION INTERNAL FIXATION (ORIF) Left ankle lateral malleolus, possible deltoid ligament repair;  Surgeon: Armor Norleen, MD;  Location: [facility] OR;  Service: Orthopedics;  Laterality: Left;   OSTEOTOMY  Right 11/23/2014   mid foot   PARTIAL THYMECTOMY  1986   ? side   TONSILLECTOMY  1954   Patient Active Problem List   Diagnosis Date Noted   Closed low lateral malleolus fracture 05/16/2021   Gout 05/16/2021   Hyperlipemia 05/16/2021   Hypertension 05/16/2021   Type II diabetes mellitus (HCC) 05/16/2021   Spinal stenosis of lumbar region 03/10/2019   Cervical myelopathy (HCC) 03/10/2019   Peripheral neuropathy 02/02/2019   Gait abnormality 02/02/2019   Urinary urgency 02/02/2019   Chronic bilateral low back pain without sciatica 02/02/2019   DOE (dyspnea on exertion) 10/05/2017   Bruit of right carotid artery 10/05/2017   Coronary artery calcification seen on CT scan 10/05/2017   Chest pain 10/04/2017   Aortic atherosclerosis (HCC) 10/04/2017   Charcot foot due to diabetes mellitus (HCC) 11/23/2014    PCP: Katina Pfeiffer, PA  REFERRING PROVIDER: Katina, PA  REFERRING DIAG: poor balance, weakness, difficulty walking  THERAPY DIAG:  Muscle weakness (generalized)  Unsteady gait  Difficulty in walking, not elsewhere classified  Rationale for Evaluation and Treatment: Rehabilitation  ONSET DATE: 09/22/23  SUBJECTIVE:   SUBJECTIVE STATEMENT: Okay, tired and hot   Had a fall in November 2022 and sustained a left Malleolar fracture with ORIF.  She was suffering from neuropathy, had just started  to use a walker but prior to that did not use a device.  We saw her April 2024 and she progressed well with walking, strength and function.  She returns today due to weakness, difficulty walking, decreased endurance, and unsteady gait.  Denies any recent falls.  She reports that she is walking much slower and is a little more unsteady, feels weak, reports that she is taking Lyrica  and Cymbalta.  PERTINENT HISTORY: See above PAIN:  Are you having pain? Yes: NPRS scale: 3/10 Pain location: low back Pain description: tight, stabbing Aggravating factors: being on feet longer,  twisting pain pain up to 6/10 Relieving factors: rest pain will go to 0/10  PRECAUTIONS: Fall  WEIGHT BEARING RESTRICTIONS: No  FALLS:  Has patient fallen in last 6 months? No, just unsteady  LIVING ENVIRONMENT: Lives with: lives alone Lives in: House/apartment Stairs: Yes: Internal: 12 steps; can reach both Has following equipment at home: Vannie - 2 wheeled, Tour manager, and Grab bars  OCCUPATION: retired  PLOF: Independent with household mobility with device and Needs assistance with homemaking has two daughters that check on her every other day, she does some cooking, does some housework  PATIENT GOALS: may go to the beach in the fall, stay living at home independently  NEXT MD VISIT: none scheduled  OBJECTIVE:   DIAGNOSTIC FINDINGS: none  COGNITION: Overall cognitive status: Within functional limits for tasks assessed     SENSATION: Mid shin down very limited sensation light touch can feel some deeper touch  EDEMA:  Very mild at the ankles  MUSCLE LENGTH: Mild tightness in the HS  POSTURE: rounded shoulders and forward head  PALPATION: Tight and tender in the lumbar paraspinals  LOWER EXTREMITY ROM:  Active ROM Right eval Left eval  Hip flexion 80 80  Hip extension    Hip abduction    Hip adduction    Hip internal rotation    Hip external rotation    Knee flexion    Knee extension 0 0  Ankle dorsiflexion 0 0  Ankle plantarflexion    Ankle inversion    Ankle eversion     (Blank rows = not tested)  LOWER EXTREMITY MMT:  MMT Right eval Left eval  Hip flexion 4 4-  Hip extension 4- 4-  Hip abduction 4- 4-  Hip adduction    Hip internal rotation    Hip external rotation    Knee flexion 4- 4-  Knee extension 4 34  Ankle dorsiflexion 1+ 0  Ankle plantarflexion 1 1  Ankle inversion 1 0  Ankle eversion 0 0   (Blank rows = not tested) FUNCTIONAL TESTS:  TUG: 37 seconds with FWW at evaluation walk test with CGA and FWW = 158 feet  at evaluation, had to stop due to fatigue Unable to stand without holding onto something  GAIT: Distance walked: 158 feet Assistive device utilized: Walker - 2 wheeled Level of assistance: SBA to start and then needs CGA Comments: foot drop bilaterally, decreased foot control, some hip drop on the left side, when fatigued poor safety with transfers   TODAY'S TREATMENT:  DATE:   12/28/23 Level 5 x 10 minutes Side stepping Marching, hip abduction and extension in Pbars 35# leg curls Leg extension 10# Feet on ball K2C, rotation, bridges, isometric abs Green tband clamshells Ball b/n knees squeeze Green tband supine hip extension   12/24/23 NuStep L5x63mins  Single arm lat pull standing 10#- one hand on walker  Leg ext 10# 2x12 HS curls 25# 2x12 Standing on airex marching holding on the walker Bicep curls 3# 2x10  Alternating punches with 2# 2x10   12/21/23 Nustep level 5 x 6 minutes 10# chest press 20# lats Black tband back flexion 20# leg press Partial sit ups 5# marches 5# hip abduction 5# single arm row and extension holding walker Green tband clamshells 3# LAQ Seated 3# marches Sidestepping 4# biceps  12/17/23 NuStep L5x30mins  Modified sit ups yellow ball 2x10 Standing marches 5#  Lateral step taps 4 with 5#  Volleyball  Lat pull down 25# 2x10 blackTB flexion 2x10  12/14/23 NuStep L5x24mins Box taps 4 with RW  STS w/RW, 2x10 some use of table against legs  OHP 3# 2x10 RUE, 2x5 LUE  HS curls 25# 2x10 Leg ext 10# 2x10 Lat pull down 20# 2x10    11/26/23 NuStep L5x7mins  Walk outside out front door to sign and back in Pball flexion x10 Standing with legs against table rows and ext 2x10 Lat pull down 20# 2x10  blackTB flexion 2x10  11/23/23 3 min walk test- able to do 344ft in 2 min 40s  NuStep L5x68mins Leg ext 10# 2x10 HS  curls 25# 2x10 Side step in bars  Mini squats in bars x10   11/19/23 NuStep L5x17mins  TUG- 32s  Lat pull down 20# 2x10  Feet on pball rotations, knees to chest x10 Bridges x10 Passive LE stretching- HS, glutes, piriformis Isometric abs x10 3s holds  AB roll small crunches x10 SLR x10, SLR 1.5# x10   11/16/23 Nustep level 5 x 7 minutes LEg curls 20# 2x10 Leg extension 10# 2x12 Lat pulls 20# 2x12 Chest press 10# 2x12 Leg press 20# Seated voilley ball Seated green tband clamshells  11/12/23 NuStep L5x79mins  3# box taps 6  Fitter pushes 2 blue bands 2x10  Step up 4 with 2 hand rails x5  Lat pull down 20# 2x10 Chest press 10# 2x10 Chest fly with cable 5# x10    11/09/23 Walking outdoors out front door and into back door- one curb  Leg ext 10# 2x10  HS curls 20# 2x10  Lat pull down 20# 2x10 Chest press 5# 2x10  Seated flexion with black band 2x10 Seated volleyball hits    11/05/23 NuStep L5x44mins  Walking outdoors- down one curbs and around building  [place] taps  Marching with 2# 2x10 Hip abduction 2# 2x10 LAQ 2# 2x10    11/02/23 Nustep level 5 x 6 minutes Gait around half the back parking [place] with one sitting rest break 4 step ups with CGA Seated volley ball  PATIENT EDUCATION:  Education details: POC Person educated: Patient Education method: Explanation Education comprehension: verbalized understanding  HOME EXERCISE PROGRAM: TBD  ASSESSMENT:  CLINICAL IMPRESSION: Patient continues to struggles with balance due to severe neuropathy. She seems to be starting to hyperextend the left knee for stability as she feels it is going to buckle.  She has no feeling in her feet with significant drop foot bilaterally.  With standing activities requires CGA   Patient is a 78 y.o. female who was seen today for physical therapy  evaluation and treatment for weakness, unsteady gait, poor balance.  She has minimal movements at the ankles and minimal sensation to the  ankles and lower legs.  She was very slow with the TUG and slow with the 3 minute walk test.  Due to the neuropathy her balance is very poor, cannot stand without holding onto something, is starting to have issues with her hands, she had not had any falls but is very careful  OBJECTIVE IMPAIRMENTS: Abnormal gait, cardiopulmonary status limiting activity, decreased activity tolerance, decreased balance, decreased coordination, decreased endurance, decreased mobility, difficulty walking, decreased ROM, decreased strength, impaired flexibility, impaired sensation, and pain.   REHAB POTENTIAL: Good  CLINICAL DECISION MAKING: Stable/uncomplicated  EVALUATION COMPLEXITY: Low   GOALS: Goals reviewed with patient? Yes  SHORT TERM GOALS: Target date: 11/11/23 Independent with initial HEP Goal status: progressing 11/16/23, not doing much at home 12/14/23  LONG TERM GOALS: Target date: 02/11/24  Independent with advanced HEP or gym program Goal status: INITIAL  2.  Decrease TUG time to 24 seconds Goal status: REVISED Will decrease TUG to < 20s    32s 11/19/23, 24s MET 12/14/23  3.  Walk 300 feet in 3 minute walk test Goal status: MET 11/23/23  4.  Be able to negotiate stairs and curbs with SBA with one at a time and a handrail Goal status: IN PROGRESS modA still needed 12/14/23  5.  Report able to warm up a meal without sitting Goal status: MET 12/14/23    PLAN:  PT FREQUENCY: 1-2x/week  PT DURATION: 12 weeks  PLANNED INTERVENTIONS: Therapeutic exercises, Therapeutic activity, Neuromuscular re-education, Balance training, Gait training, Patient/Family education, Self Care, Joint mobilization, and Manual therapy  PLAN FOR NEXT SESSION: see if we can continue to work on the coordination of the LE's for balance and gait  Kert Shackett W, PT 12/28/2023, 3:32 PM

## 2023-12-31 ENCOUNTER — Ambulatory Visit

## 2023-12-31 DIAGNOSIS — M6281 Muscle weakness (generalized): Secondary | ICD-10-CM | POA: Diagnosis not present

## 2023-12-31 DIAGNOSIS — R2681 Unsteadiness on feet: Secondary | ICD-10-CM | POA: Diagnosis not present

## 2023-12-31 DIAGNOSIS — R262 Difficulty in walking, not elsewhere classified: Secondary | ICD-10-CM

## 2023-12-31 NOTE — Therapy (Signed)
 OUTPATIENT PHYSICAL THERAPY LOWER EXTREMITY TREATMENT    Patient Name: Pamela Snyder MRN: [id number] DOB:December 30, 1945, 78 y.o., female Today's Date: 12/31/2023  END OF SESSION:  PT End of Session - 12/31/23 1709     Visit Number 15    Date for PT Re-Evaluation 01/11/24    Authorization Type Medicare    PT Start Time 1710    PT Stop Time 1755    PT Time Calculation (min) 45 min    Activity Tolerance Patient tolerated treatment well    Behavior During Therapy [facility] for tasks assessed/performed                     Past Medical History:  Diagnosis Date   Anxiety    Arthritis    right foot; spine; hands (11/23/2014)   Charcot foot due to diabetes mellitus (HCC)    Depression    GERD (gastroesophageal reflux disease)    Gout    Hyperlipemia    Hypertension    Migraine    hx   Neuropathy    Numbness    Thyroid  goiter 1986   Type II diabetes mellitus (HCC)    Past Surgical History:  Procedure Laterality Date   ABDOMINAL HYSTERECTOMY  1999   w/BSO   ACHILLES TENDON LENGTHENING Right 11/23/2014   ACHILLES TENDON LENGTHENING Right 11/23/2014   Procedure: RIGHT ACHILLES PERCUTANEOUS TENDON LENGTHENING;  Surgeon: Norleen Armor, MD;  Location: [facility] OR;  Service: Orthopedics;  Laterality: Right;   APPENDECTOMY  1963   ARTHRODESIS Right 11/23/2014   mid foot   CARDIAC CATHETERIZATION  08/2004   FOOT ARTHRODESIS Right 11/23/2014   Procedure: RIGHT MID FOOT ARTHRODESIS;  Surgeon: Norleen Armor, MD;  Location: [facility] OR;  Service: Orthopedics;  Laterality: Right;   METATARSAL OSTEOTOMY Right 11/23/2014   Procedure: RIGHT MID FOOT OSTEOTOMY;  Surgeon: Norleen Armor, MD;  Location: [facility] OR;  Service: Orthopedics;  Laterality: Right;   ORIF ANKLE FRACTURE Left 05/16/2021   Procedure: OPEN REDUCTION INTERNAL FIXATION (ORIF) Left ankle lateral malleolus, possible deltoid ligament repair;  Surgeon: Armor Norleen, MD;  Location: [facility] OR;  Service: Orthopedics;  Laterality: Left;   OSTEOTOMY  Right 11/23/2014   mid foot   PARTIAL THYMECTOMY  1986   ? side   TONSILLECTOMY  1954   Patient Active Problem List   Diagnosis Date Noted   Closed low lateral malleolus fracture 05/16/2021   Gout 05/16/2021   Hyperlipemia 05/16/2021   Hypertension 05/16/2021   Type II diabetes mellitus (HCC) 05/16/2021   Spinal stenosis of lumbar region 03/10/2019   Cervical myelopathy (HCC) 03/10/2019   Peripheral neuropathy 02/02/2019   Gait abnormality 02/02/2019   Urinary urgency 02/02/2019   Chronic bilateral low back pain without sciatica 02/02/2019   DOE (dyspnea on exertion) 10/05/2017   Bruit of right carotid artery 10/05/2017   Coronary artery calcification seen on CT scan 10/05/2017   Chest pain 10/04/2017   Aortic atherosclerosis (HCC) 10/04/2017   Charcot foot due to diabetes mellitus (HCC) 11/23/2014    PCP: Katina Pfeiffer, PA  REFERRING PROVIDER: Katina, PA  REFERRING DIAG: poor balance, weakness, difficulty walking  THERAPY DIAG:  Muscle weakness (generalized)  Unsteady gait  Difficulty in walking, not elsewhere classified  Rationale for Evaluation and Treatment: Rehabilitation  ONSET DATE: 09/22/23  SUBJECTIVE:   SUBJECTIVE STATEMENT: Well it's going, Ozell worked me out on Monday I felt it but it was good and the right amount to feel that my muscles worked.  Had a fall in November 2022 and sustained a left Malleolar fracture with ORIF.  She was suffering from neuropathy, had just started to use a walker but prior to that did not use a device.  We saw her April 2024 and she progressed well with walking, strength and function.  She returns today due to weakness, difficulty walking, decreased endurance, and unsteady gait.  Denies any recent falls.  She reports that she is walking much slower and is a little more unsteady, feels weak, reports that she is taking Lyrica  and Cymbalta.  PERTINENT HISTORY: See above PAIN:  Are you having pain? Yes: NPRS scale:  3/10 Pain location: low back Pain description: tight, stabbing Aggravating factors: being on feet longer, twisting pain pain up to 6/10 Relieving factors: rest pain will go to 0/10  PRECAUTIONS: Fall  WEIGHT BEARING RESTRICTIONS: No  FALLS:  Has patient fallen in last 6 months? No, just unsteady  LIVING ENVIRONMENT: Lives with: lives alone Lives in: House/apartment Stairs: Yes: Internal: 12 steps; can reach both Has following equipment at home: Vannie - 2 wheeled, Tour manager, and Grab bars  OCCUPATION: retired  PLOF: Independent with household mobility with device and Needs assistance with homemaking has two daughters that check on her every other day, she does some cooking, does some housework  PATIENT GOALS: may go to the beach in the fall, stay living at home independently  NEXT MD VISIT: none scheduled  OBJECTIVE:   DIAGNOSTIC FINDINGS: none  COGNITION: Overall cognitive status: Within functional limits for tasks assessed     SENSATION: Mid shin down very limited sensation light touch can feel some deeper touch  EDEMA:  Very mild at the ankles  MUSCLE LENGTH: Mild tightness in the HS  POSTURE: rounded shoulders and forward head  PALPATION: Tight and tender in the lumbar paraspinals  LOWER EXTREMITY ROM:  Active ROM Right eval Left eval  Hip flexion 80 80  Hip extension    Hip abduction    Hip adduction    Hip internal rotation    Hip external rotation    Knee flexion    Knee extension 0 0  Ankle dorsiflexion 0 0  Ankle plantarflexion    Ankle inversion    Ankle eversion     (Blank rows = not tested)  LOWER EXTREMITY MMT:  MMT Right eval Left eval  Hip flexion 4 4-  Hip extension 4- 4-  Hip abduction 4- 4-  Hip adduction    Hip internal rotation    Hip external rotation    Knee flexion 4- 4-  Knee extension 4 34  Ankle dorsiflexion 1+ 0  Ankle plantarflexion 1 1  Ankle inversion 1 0  Ankle eversion 0 0   (Blank rows = not  tested) FUNCTIONAL TESTS:  TUG: 37 seconds with FWW at evaluation walk test with CGA and FWW = 158 feet at evaluation, had to stop due to fatigue Unable to stand without holding onto something  GAIT: Distance walked: 158 feet Assistive device utilized: Walker - 2 wheeled Level of assistance: SBA to start and then needs CGA Comments: foot drop bilaterally, decreased foot control, some hip drop on the left side, when fatigued poor safety with transfers   TODAY'S TREATMENT:  DATE:   12/31/23 NuStep L5x61mins  Stepping over obstacles in bars forwards and side steps  Balance on airex in bars 2 finger support  Step ups on airex in bars x5 each side  Lat pull downs 20# 3x10 Chest press 10# 3x10  12/28/23 Level 5 x 10 minutes Side stepping Marching, hip abduction and extension in Pbars 35# leg curls Leg extension 10# Feet on ball K2C, rotation, bridges, isometric abs Green tband clamshells Ball b/n knees squeeze Green tband supine hip extension   12/24/23 NuStep L5x17mins  Single arm lat pull standing 10#- one hand on walker  Leg ext 10# 2x12 HS curls 25# 2x12 Standing on airex marching holding on the walker Bicep curls 3# 2x10  Alternating punches with 2# 2x10   12/21/23 Nustep level 5 x 6 minutes 10# chest press 20# lats Black tband back flexion 20# leg press Partial sit ups 5# marches 5# hip abduction 5# single arm row and extension holding walker Green tband clamshells 3# LAQ Seated 3# marches Sidestepping 4# biceps  12/17/23 NuStep L5x62mins  Modified sit ups yellow ball 2x10 Standing marches 5#  Lateral step taps 4 with 5#  Volleyball  Lat pull down 25# 2x10 blackTB flexion 2x10  12/14/23 NuStep L5x22mins Box taps 4 with RW  STS w/RW, 2x10 some use of table against legs  OHP 3# 2x10 RUE, 2x5 LUE  HS curls 25# 2x10 Leg ext  10# 2x10 Lat pull down 20# 2x10    11/26/23 NuStep L5x60mins  Walk outside out front door to sign and back in Pball flexion x10 Standing with legs against table rows and ext 2x10 Lat pull down 20# 2x10  blackTB flexion 2x10  11/23/23 3 min walk test- able to do 373ft in 2 min 40s  NuStep L5x85mins Leg ext 10# 2x10 HS curls 25# 2x10 Side step in bars  Mini squats in bars x10   11/19/23 NuStep L5x60mins  TUG- 32s  Lat pull down 20# 2x10  Feet on pball rotations, knees to chest x10 Bridges x10 Passive LE stretching- HS, glutes, piriformis Isometric abs x10 3s holds  AB roll small crunches x10 SLR x10, SLR 1.5# x10   11/16/23 Nustep level 5 x 7 minutes LEg curls 20# 2x10 Leg extension 10# 2x12 Lat pulls 20# 2x12 Chest press 10# 2x12 Leg press 20# Seated voilley ball Seated green tband clamshells  11/12/23 NuStep L5x33mins  3# box taps 6  Fitter pushes 2 blue bands 2x10  Step up 4 with 2 hand rails x5  Lat pull down 20# 2x10 Chest press 10# 2x10 Chest fly with cable 5# x10    11/09/23 Walking outdoors out front door and into back door- one curb  Leg ext 10# 2x10  HS curls 20# 2x10  Lat pull down 20# 2x10 Chest press 5# 2x10  Seated flexion with black band 2x10 Seated volleyball hits    11/05/23 NuStep L5x29mins  Walking outdoors- down one curbs and around building  [place] taps  Marching with 2# 2x10 Hip abduction 2# 2x10 LAQ 2# 2x10    11/02/23 Nustep level 5 x 6 minutes Gait around half the back parking [place] with one sitting rest break 4 step ups with CGA Seated volley ball  PATIENT EDUCATION:  Education details: POC Person educated: Patient Education method: Explanation Education comprehension: verbalized understanding  HOME EXERCISE PROGRAM: TBD  ASSESSMENT:  CLINICAL IMPRESSION: Patient continues to struggles with balance due to severe neuropathy. Her left leg is giving her more trouble today and she  feels that it is really week and she has  trouble picking it up. Tried more standing activities in bars today. She is able to stand on the airex pad with just 2 finger support for about 10-15s. Standing activities requires CGA   Patient is a 78 y.o. female who was seen today for physical therapy evaluation and treatment for weakness, unsteady gait, poor balance.  She has minimal movements at the ankles and minimal sensation to the ankles and lower legs.  She was very slow with the TUG and slow with the 3 minute walk test.  Due to the neuropathy her balance is very poor, cannot stand without holding onto something, is starting to have issues with her hands, she had not had any falls but is very careful  OBJECTIVE IMPAIRMENTS: Abnormal gait, cardiopulmonary status limiting activity, decreased activity tolerance, decreased balance, decreased coordination, decreased endurance, decreased mobility, difficulty walking, decreased ROM, decreased strength, impaired flexibility, impaired sensation, and pain.   REHAB POTENTIAL: Good  CLINICAL DECISION MAKING: Stable/uncomplicated  EVALUATION COMPLEXITY: Low   GOALS: Goals reviewed with patient? Yes  SHORT TERM GOALS: Target date: 11/11/23 Independent with initial HEP Goal status: progressing 11/16/23, not doing much at home 12/14/23  LONG TERM GOALS: Target date: 01/11/24  Independent with advanced HEP or gym program Goal status: INITIAL  2.  Decrease TUG time to 24 seconds Goal status: REVISED Will decrease TUG to < 20s    32s 11/19/23, 24s MET 12/14/23  3.  Walk 300 feet in 3 minute walk test Goal status: MET 11/23/23  4.  Be able to negotiate stairs and curbs with SBA with one at a time and a handrail Goal status: IN PROGRESS modA still needed 12/14/23  5.  Report able to warm up a meal without sitting Goal status: MET 12/14/23    PLAN:  PT FREQUENCY: 1-2x/week  PT DURATION: 12 weeks  PLANNED INTERVENTIONS: Therapeutic exercises, Therapeutic activity, Neuromuscular re-education,  Balance training, Gait training, Patient/Family education, Self Care, Joint mobilization, and Manual therapy  PLAN FOR NEXT SESSION: see if we can continue to work on the coordination of the LE's for balance and gait, recert until end of July  Alhaji Mcneal, PT 12/31/2023, 6:40 PM

## 2024-01-04 ENCOUNTER — Encounter: Payer: Self-pay | Admitting: Physical Therapy

## 2024-01-04 ENCOUNTER — Ambulatory Visit: Admitting: Physical Therapy

## 2024-01-04 DIAGNOSIS — R2681 Unsteadiness on feet: Secondary | ICD-10-CM

## 2024-01-04 DIAGNOSIS — R262 Difficulty in walking, not elsewhere classified: Secondary | ICD-10-CM | POA: Diagnosis not present

## 2024-01-04 DIAGNOSIS — M810 Age-related osteoporosis without current pathological fracture: Secondary | ICD-10-CM | POA: Diagnosis not present

## 2024-01-04 DIAGNOSIS — E7800 Pure hypercholesterolemia, unspecified: Secondary | ICD-10-CM | POA: Diagnosis not present

## 2024-01-04 DIAGNOSIS — I10 Essential (primary) hypertension: Secondary | ICD-10-CM | POA: Diagnosis not present

## 2024-01-04 DIAGNOSIS — E1143 Type 2 diabetes mellitus with diabetic autonomic (poly)neuropathy: Secondary | ICD-10-CM | POA: Diagnosis not present

## 2024-01-04 DIAGNOSIS — M6281 Muscle weakness (generalized): Secondary | ICD-10-CM | POA: Diagnosis not present

## 2024-01-04 DIAGNOSIS — I2510 Atherosclerotic heart disease of native coronary artery without angina pectoris: Secondary | ICD-10-CM | POA: Diagnosis not present

## 2024-01-04 DIAGNOSIS — N183 Chronic kidney disease, stage 3 unspecified: Secondary | ICD-10-CM | POA: Diagnosis not present

## 2024-01-04 NOTE — Therapy (Signed)
 OUTPATIENT PHYSICAL THERAPY LOWER EXTREMITY TREATMENT    Patient Name: Pamela Snyder MRN: [id number] DOB:02/28/1946, 78 y.o., female Today's Date: 01/04/2024  END OF SESSION:  PT End of Session - 01/04/24 1532     Visit Number 16    Date for PT Re-Evaluation 01/11/24    Authorization Type Medicare    PT Start Time 1527    PT Stop Time 1614    PT Time Calculation (min) 47 min    Activity Tolerance Patient tolerated treatment well    Behavior During Therapy [facility] for tasks assessed/performed                     Past Medical History:  Diagnosis Date   Anxiety    Arthritis    right foot; spine; hands (11/23/2014)   Charcot foot due to diabetes mellitus (HCC)    Depression    GERD (gastroesophageal reflux disease)    Gout    Hyperlipemia    Hypertension    Migraine    hx   Neuropathy    Numbness    Thyroid  goiter 1986   Type II diabetes mellitus (HCC)    Past Surgical History:  Procedure Laterality Date   ABDOMINAL HYSTERECTOMY  1999   w/BSO   ACHILLES TENDON LENGTHENING Right 11/23/2014   ACHILLES TENDON LENGTHENING Right 11/23/2014   Procedure: RIGHT ACHILLES PERCUTANEOUS TENDON LENGTHENING;  Surgeon: Norleen Armor, MD;  Location: [facility] OR;  Service: Orthopedics;  Laterality: Right;   APPENDECTOMY  1963   ARTHRODESIS Right 11/23/2014   mid foot   CARDIAC CATHETERIZATION  08/2004   FOOT ARTHRODESIS Right 11/23/2014   Procedure: RIGHT MID FOOT ARTHRODESIS;  Surgeon: Norleen Armor, MD;  Location: [facility] OR;  Service: Orthopedics;  Laterality: Right;   METATARSAL OSTEOTOMY Right 11/23/2014   Procedure: RIGHT MID FOOT OSTEOTOMY;  Surgeon: Norleen Armor, MD;  Location: [facility] OR;  Service: Orthopedics;  Laterality: Right;   ORIF ANKLE FRACTURE Left 05/16/2021   Procedure: OPEN REDUCTION INTERNAL FIXATION (ORIF) Left ankle lateral malleolus, possible deltoid ligament repair;  Surgeon: Armor Norleen, MD;  Location: [facility] OR;  Service: Orthopedics;  Laterality: Left;   OSTEOTOMY  Right 11/23/2014   mid foot   PARTIAL THYMECTOMY  1986   ? side   TONSILLECTOMY  1954   Patient Active Problem List   Diagnosis Date Noted   Closed low lateral malleolus fracture 05/16/2021   Gout 05/16/2021   Hyperlipemia 05/16/2021   Hypertension 05/16/2021   Type II diabetes mellitus (HCC) 05/16/2021   Spinal stenosis of lumbar region 03/10/2019   Cervical myelopathy (HCC) 03/10/2019   Peripheral neuropathy 02/02/2019   Gait abnormality 02/02/2019   Urinary urgency 02/02/2019   Chronic bilateral low back pain without sciatica 02/02/2019   DOE (dyspnea on exertion) 10/05/2017   Bruit of right carotid artery 10/05/2017   Coronary artery calcification seen on CT scan 10/05/2017   Chest pain 10/04/2017   Aortic atherosclerosis (HCC) 10/04/2017   Charcot foot due to diabetes mellitus (HCC) 11/23/2014    PCP: Katina Pfeiffer, PA  REFERRING PROVIDER: Katina, PA  REFERRING DIAG: poor balance, weakness, difficulty walking  THERAPY DIAG:  Muscle weakness (generalized)  Unsteady gait  Difficulty in walking, not elsewhere classified  Rationale for Evaluation and Treatment: Rehabilitation  ONSET DATE: 09/22/23  SUBJECTIVE:   SUBJECTIVE STATEMENT: Just really slow and fatigued    Had a fall in November 2022 and sustained a left Malleolar fracture with ORIF.  She was suffering from neuropathy,  had just started to use a walker but prior to that did not use a device.  We saw her April 2024 and she progressed well with walking, strength and function.  She returns today due to weakness, difficulty walking, decreased endurance, and unsteady gait.  Denies any recent falls.  She reports that she is walking much slower and is a little more unsteady, feels weak, reports that she is taking Lyrica  and Cymbalta.  PERTINENT HISTORY: See above PAIN:  Are you having pain? Yes: NPRS scale: 3/10 Pain location: low back Pain description: tight, stabbing Aggravating factors: being on feet  longer, twisting pain pain up to 6/10 Relieving factors: rest pain will go to 0/10  PRECAUTIONS: Fall  WEIGHT BEARING RESTRICTIONS: No  FALLS:  Has patient fallen in last 6 months? No, just unsteady  LIVING ENVIRONMENT: Lives with: lives alone Lives in: House/apartment Stairs: Yes: Internal: 12 steps; can reach both Has following equipment at home: Vannie - 2 wheeled, Tour manager, and Grab bars  OCCUPATION: retired  PLOF: Independent with household mobility with device and Needs assistance with homemaking has two daughters that check on her every other day, she does some cooking, does some housework  PATIENT GOALS: may go to the beach in the fall, stay living at home independently  NEXT MD VISIT: none scheduled  OBJECTIVE:   DIAGNOSTIC FINDINGS: none  COGNITION: Overall cognitive status: Within functional limits for tasks assessed     SENSATION: Mid shin down very limited sensation light touch can feel some deeper touch  EDEMA:  Very mild at the ankles  MUSCLE LENGTH: Mild tightness in the HS  POSTURE: rounded shoulders and forward head  PALPATION: Tight and tender in the lumbar paraspinals  LOWER EXTREMITY ROM:  Active ROM Right eval Left eval  Hip flexion 80 80  Hip extension    Hip abduction    Hip adduction    Hip internal rotation    Hip external rotation    Knee flexion    Knee extension 0 0  Ankle dorsiflexion 0 0  Ankle plantarflexion    Ankle inversion    Ankle eversion     (Blank rows = not tested)  LOWER EXTREMITY MMT:  MMT Right eval Left eval  Hip flexion 4 4-  Hip extension 4- 4-  Hip abduction 4- 4-  Hip adduction    Hip internal rotation    Hip external rotation    Knee flexion 4- 4-  Knee extension 4 34  Ankle dorsiflexion 1+ 0  Ankle plantarflexion 1 1  Ankle inversion 1 0  Ankle eversion 0 0   (Blank rows = not tested) FUNCTIONAL TESTS:  TUG: 37 seconds with FWW at evaluation walk test with CGA and FWW =  158 feet at evaluation, had to stop due to fatigue Unable to stand without holding onto something  GAIT: Distance walked: 158 feet Assistive device utilized: Walker - 2 wheeled Level of assistance: SBA to start and then needs CGA Comments: foot drop bilaterally, decreased foot control, some hip drop on the left side, when fatigued poor safety with transfers   TODAY'S TREATMENT:  DATE:   01/04/24 Nustep level 5 x 8 minutes Partial sit ups Feet on ball K2C, rotation, bridges, isometric abs Ball b/n knees bridge LAts 25#  Black tband trunk flexion Leg press 20# both, then single legs 2x5 each Leg curls 25#  12/31/23 NuStep L5x76mins  Stepping over obstacles in bars forwards and side steps  Balance on airex in bars 2 finger support  Step ups on airex in bars x5 each side  Lat pull downs 20# 3x10 Chest press 10# 3x10  12/28/23 Level 5 x 10 minutes Side stepping Marching, hip abduction and extension in Pbars 35# leg curls Leg extension 10# Feet on ball K2C, rotation, bridges, isometric abs Green tband clamshells Ball b/n knees squeeze Green tband supine hip extension   12/24/23 NuStep L5x51mins  Single arm lat pull standing 10#- one hand on walker  Leg ext 10# 2x12 HS curls 25# 2x12 Standing on airex marching holding on the walker Bicep curls 3# 2x10  Alternating punches with 2# 2x10   12/21/23 Nustep level 5 x 6 minutes 10# chest press 20# lats Black tband back flexion 20# leg press Partial sit ups 5# marches 5# hip abduction 5# single arm row and extension holding walker Green tband clamshells 3# LAQ Seated 3# marches Sidestepping 4# biceps  12/17/23 NuStep L5x50mins  Modified sit ups yellow ball 2x10 Standing marches 5#  Lateral step taps 4 with 5#  Volleyball  Lat pull down 25# 2x10 blackTB flexion 2x10  12/14/23 NuStep  L5x77mins Box taps 4 with RW  STS w/RW, 2x10 some use of table against legs  OHP 3# 2x10 RUE, 2x5 LUE  HS curls 25# 2x10 Leg ext 10# 2x10 Lat pull down 20# 2x10    11/26/23 NuStep L5x35mins  Walk outside out front door to sign and back in Pball flexion x10 Standing with legs against table rows and ext 2x10 Lat pull down 20# 2x10  blackTB flexion 2x10  11/23/23 3 min walk test- able to do 312ft in 2 min 40s  NuStep L5x25mins Leg ext 10# 2x10 HS curls 25# 2x10 Side step in bars  Mini squats in bars x10   11/19/23 NuStep L5x66mins  TUG- 32s  Lat pull down 20# 2x10  Feet on pball rotations, knees to chest x10 Bridges x10 Passive LE stretching- HS, glutes, piriformis Isometric abs x10 3s holds  AB roll small crunches x10 SLR x10, SLR 1.5# x10   11/16/23 Nustep level 5 x 7 minutes LEg curls 20# 2x10 Leg extension 10# 2x12 Lat pulls 20# 2x12 Chest press 10# 2x12 Leg press 20# Seated voilley ball Seated green tband clamshells  PATIENT EDUCATION:  Education details: POC Person educated: Patient Education method: Explanation Education comprehension: verbalized understanding  HOME EXERCISE PROGRAM: TBD  ASSESSMENT:  CLINICAL IMPRESSION: I had her do single legs on the leg press, this was very difficult for her especially in the left, had to have some assist and have a smaller ROM.  I did work on the core mms today and she does well but needs cues for the motions, again the neuropathy is significant    Patient is a 78 y.o. female who was seen today for physical therapy evaluation and treatment for weakness, unsteady gait, poor balance.  She has minimal movements at the ankles and minimal sensation to the ankles and lower legs.  She was very slow with the TUG and slow with the 3 minute walk test.  Due to the neuropathy her balance is very poor, cannot stand without  holding onto something, is starting to have issues with her hands, she had not had any falls but is very  careful  OBJECTIVE IMPAIRMENTS: Abnormal gait, cardiopulmonary status limiting activity, decreased activity tolerance, decreased balance, decreased coordination, decreased endurance, decreased mobility, difficulty walking, decreased ROM, decreased strength, impaired flexibility, impaired sensation, and pain.   REHAB POTENTIAL: Good  CLINICAL DECISION MAKING: Stable/uncomplicated  EVALUATION COMPLEXITY: Low   GOALS: Goals reviewed with patient? Yes  SHORT TERM GOALS: Target date: 11/11/23 Independent with initial HEP Goal status: progressing 11/16/23, not doing much at home 12/14/23  LONG TERM GOALS: Target date: 01/11/24  Independent with advanced HEP or gym program Goal status: ongoing 01/04/24  2.  Decrease TUG time to 24 seconds Goal status: REVISED Will decrease TUG to < 20s    32s 11/19/23, 24s MET 12/14/23  3.  Walk 300 feet in 3 minute walk test Goal status: MET 11/23/23  4.  Be able to negotiate stairs and curbs with SBA with one at a time and a handrail Goal status: IN PROGRESS modA still needed 01/04/24  5.  Report able to warm up a meal without sitting Goal status: MET 12/14/23    PLAN:  PT FREQUENCY: 1-2x/week  PT DURATION: 12 weeks  PLANNED INTERVENTIONS: Therapeutic exercises, Therapeutic activity, Neuromuscular re-education, Balance training, Gait training, Patient/Family education, Self Care, Joint mobilization, and Manual therapy  PLAN FOR NEXT SESSION: see if we can continue to work on the coordination of the LE's for balance and gait, recert until end of July  Srihaan Mastrangelo W, PT 01/04/2024, 3:33 PM

## 2024-01-14 ENCOUNTER — Ambulatory Visit: Attending: Neurology | Admitting: Physical Therapy

## 2024-01-14 DIAGNOSIS — R2681 Unsteadiness on feet: Secondary | ICD-10-CM | POA: Diagnosis not present

## 2024-01-14 DIAGNOSIS — M6281 Muscle weakness (generalized): Secondary | ICD-10-CM | POA: Insufficient documentation

## 2024-01-14 DIAGNOSIS — R262 Difficulty in walking, not elsewhere classified: Secondary | ICD-10-CM | POA: Diagnosis not present

## 2024-01-14 NOTE — Therapy (Signed)
 OUTPATIENT PHYSICAL THERAPY LOWER EXTREMITY TREATMENT    Patient Name: Pamela Snyder MRN: [id number] DOB:20-Jul-1945, 78 y.o., female Today's Date: 01/14/2024  END OF SESSION:  PT End of Session - 01/14/24 1702     Visit Number 17    Date for PT Re-Evaluation 02/19/24    PT Start Time 1700    PT Stop Time 1748    PT Time Calculation (min) 48 min                     Past Medical History:  Diagnosis Date   Anxiety    Arthritis    right foot; spine; hands (11/23/2014)   Charcot foot due to diabetes mellitus (HCC)    Depression    GERD (gastroesophageal reflux disease)    Gout    Hyperlipemia    Hypertension    Migraine    hx   Neuropathy    Numbness    Thyroid  goiter 1986   Type II diabetes mellitus (HCC)    Past Surgical History:  Procedure Laterality Date   ABDOMINAL HYSTERECTOMY  1999   w/BSO   ACHILLES TENDON LENGTHENING Right 11/23/2014   ACHILLES TENDON LENGTHENING Right 11/23/2014   Procedure: RIGHT ACHILLES PERCUTANEOUS TENDON LENGTHENING;  Surgeon: Norleen Armor, MD;  Location: [facility] OR;  Service: Orthopedics;  Laterality: Right;   APPENDECTOMY  1963   ARTHRODESIS Right 11/23/2014   mid foot   CARDIAC CATHETERIZATION  08/2004   FOOT ARTHRODESIS Right 11/23/2014   Procedure: RIGHT MID FOOT ARTHRODESIS;  Surgeon: Norleen Armor, MD;  Location: [facility] OR;  Service: Orthopedics;  Laterality: Right;   METATARSAL OSTEOTOMY Right 11/23/2014   Procedure: RIGHT MID FOOT OSTEOTOMY;  Surgeon: Norleen Armor, MD;  Location: [facility] OR;  Service: Orthopedics;  Laterality: Right;   ORIF ANKLE FRACTURE Left 05/16/2021   Procedure: OPEN REDUCTION INTERNAL FIXATION (ORIF) Left ankle lateral malleolus, possible deltoid ligament repair;  Surgeon: Armor Norleen, MD;  Location: [facility] OR;  Service: Orthopedics;  Laterality: Left;   OSTEOTOMY Right 11/23/2014   mid foot   PARTIAL THYMECTOMY  1986   ? side   TONSILLECTOMY  1954   Patient Active Problem List   Diagnosis Date Noted    Closed low lateral malleolus fracture 05/16/2021   Gout 05/16/2021   Hyperlipemia 05/16/2021   Hypertension 05/16/2021   Type II diabetes mellitus (HCC) 05/16/2021   Spinal stenosis of lumbar region 03/10/2019   Cervical myelopathy (HCC) 03/10/2019   Peripheral neuropathy 02/02/2019   Gait abnormality 02/02/2019   Urinary urgency 02/02/2019   Chronic bilateral low back pain without sciatica 02/02/2019   DOE (dyspnea on exertion) 10/05/2017   Bruit of right carotid artery 10/05/2017   Coronary artery calcification seen on CT scan 10/05/2017   Chest pain 10/04/2017   Aortic atherosclerosis (HCC) 10/04/2017   Charcot foot due to diabetes mellitus (HCC) 11/23/2014    PCP: Katina Pfeiffer, PA  REFERRING PROVIDER: Katina, PA  REFERRING DIAG: poor balance, weakness, difficulty walking  THERAPY DIAG:  Muscle weakness (generalized)  Unsteady gait  Difficulty in walking, not elsewhere classified  Rationale for Evaluation and Treatment: Rehabilitation  ONSET DATE: 09/22/23  SUBJECTIVE:   SUBJECTIVE STATEMENT: Girls have been OOT so no transportation. Not been very active. Sluggish   Had a fall in November 2022 and sustained a left Malleolar fracture with ORIF.  She was suffering from neuropathy, had just started to use a walker but prior to that did not use a device.  We saw  her April 2024 and she progressed well with walking, strength and function.  She returns today due to weakness, difficulty walking, decreased endurance, and unsteady gait.  Denies any recent falls.  She reports that she is walking much slower and is a little more unsteady, feels weak, reports that she is taking Lyrica  and Cymbalta.  PERTINENT HISTORY: See above PAIN:  Are you having pain? Yes: NPRS scale: 3/10 Pain location: low back Pain description: tight, stabbing Aggravating factors: being on feet longer, twisting pain pain up to 6/10 Relieving factors: rest pain will go to 0/10  PRECAUTIONS:  Fall  WEIGHT BEARING RESTRICTIONS: No  FALLS:  Has patient fallen in last 6 months? No, just unsteady  LIVING ENVIRONMENT: Lives with: lives alone Lives in: House/apartment Stairs: Yes: Internal: 12 steps; can reach both Has following equipment at home: Vannie - 2 wheeled, Tour manager, and Grab bars  OCCUPATION: retired  PLOF: Independent with household mobility with device and Needs assistance with homemaking has two daughters that check on her every other day, she does some cooking, does some housework  PATIENT GOALS: may go to the beach in the fall, stay living at home independently  NEXT MD VISIT: none scheduled  OBJECTIVE:   DIAGNOSTIC FINDINGS: none  COGNITION: Overall cognitive status: Within functional limits for tasks assessed     SENSATION: Mid shin down very limited sensation light touch can feel some deeper touch  EDEMA:  Very mild at the ankles  MUSCLE LENGTH: Mild tightness in the HS  POSTURE: rounded shoulders and forward head  PALPATION: Tight and tender in the lumbar paraspinals  LOWER EXTREMITY ROM:  Active ROM Right eval Left eval  Hip flexion 80 80  Hip extension    Hip abduction    Hip adduction    Hip internal rotation    Hip external rotation    Knee flexion    Knee extension 0 0  Ankle dorsiflexion 0 0  Ankle plantarflexion    Ankle inversion    Ankle eversion     (Blank rows = not tested)  LOWER EXTREMITY MMT:  MMT Right eval Left eval RT /left  01/14/24  Hip flexion 4 4- 4+/4+  Hip extension 4- 4- 4/4  Hip abduction 4- 4- 4/4  Hip adduction     Hip internal rotation     Hip external rotation     Knee flexion 4- 4- WFLS BIL  Knee extension 4 34 WFLs BIL  Ankle dorsiflexion 1+ 0   Ankle plantarflexion 1 1   Ankle inversion 1 0   Ankle eversion 0 0    (Blank rows = not tested) FUNCTIONAL TESTS:  TUG: 37 seconds with FWW at evaluation walk test with CGA and FWW = 158 feet at evaluation, had to stop due to  fatigue Unable to stand without holding onto something  GAIT: Distance walked: 158 feet Assistive device utilized: Walker - 2 wheeled Level of assistance: SBA to start and then needs CGA Comments: foot drop bilaterally, decreased foot control, some hip drop on the left side, when fatigued poor safety with transfers   TODAY'S TREATMENT:  DATE:    01/14/23 Nustep L 5 8 min Checked goals and  MMT and ROM ( see above chart) TUG with RW  20.21 sec Black tband trunk flexion 20 x Lat pull 25# 2 sets 10 Seated EOM wt ball toss for core Seated EOM 3# bar ball hitting LAQ 3# 2 sets 10 Standing with RW 3# ankle wts marching,sl hip flex,ext and abd 10x each CGA and guarding on knees to prevent buckling. ABD  challenging Ball toss standing min -mod A with multi LOB back onto mat 12 x    01/04/24 Nustep level 5 x 8 minutes Partial sit ups Feet on ball K2C, rotation, bridges, isometric abs Ball b/n knees bridge LAts 25#  Black tband trunk flexion Leg press 20# both, then single legs 2x5 each Leg curls 25#  12/31/23 NuStep L5x55mins  Stepping over obstacles in bars forwards and side steps  Balance on airex in bars 2 finger support  Step ups on airex in bars x5 each side  Lat pull downs 20# 3x10 Chest press 10# 3x10  12/28/23 Level 5 x 10 minutes Side stepping Marching, hip abduction and extension in Pbars 35# leg curls Leg extension 10# Feet on ball K2C, rotation, bridges, isometric abs Green tband clamshells Ball b/n knees squeeze Green tband supine hip extension   12/24/23 NuStep L5x3mins  Single arm lat pull standing 10#- one hand on walker  Leg ext 10# 2x12 HS curls 25# 2x12 Standing on airex marching holding on the walker Bicep curls 3# 2x10  Alternating punches with 2# 2x10   12/21/23 Nustep level 5 x 6 minutes 10# chest press 20# lats Black  tband back flexion 20# leg press Partial sit ups 5# marches 5# hip abduction 5# single arm row and extension holding walker Green tband clamshells 3# LAQ Seated 3# marches Sidestepping 4# biceps  12/17/23 NuStep L5x26mins  Modified sit ups yellow ball 2x10 Standing marches 5#  Lateral step taps 4 with 5#  Volleyball  Lat pull down 25# 2x10 blackTB flexion 2x10  12/14/23 NuStep L5x41mins Box taps 4 with RW  STS w/RW, 2x10 some use of table against legs  OHP 3# 2x10 RUE, 2x5 LUE  HS curls 25# 2x10 Leg ext 10# 2x10 Lat pull down 20# 2x10    11/26/23 NuStep L5x22mins  Walk outside out front door to sign and back in Pball flexion x10 Standing with legs against table rows and ext 2x10 Lat pull down 20# 2x10  blackTB flexion 2x10  11/23/23 3 min walk test- able to do 319ft in 2 min 40s  NuStep L5x37mins Leg ext 10# 2x10 HS curls 25# 2x10 Side step in bars  Mini squats in bars x10   11/19/23 NuStep L5x46mins  TUG- 32s  Lat pull down 20# 2x10  Feet on pball rotations, knees to chest x10 Bridges x10 Passive LE stretching- HS, glutes, piriformis Isometric abs x10 3s holds  AB roll small crunches x10 SLR x10, SLR 1.5# x10   11/16/23 Nustep level 5 x 7 minutes LEg curls 20# 2x10 Leg extension 10# 2x12 Lat pulls 20# 2x12 Chest press 10# 2x12 Leg press 20# Seated voilley ball Seated green tband clamshells  PATIENT EDUCATION:  Education details: POC Person educated: Patient Education method: Explanation Education comprehension: verbalized understanding  HOME EXERCISE PROGRAM: TBD  ASSESSMENT:  CLINICAL IMPRESSION: pt returns after a couple weeks as she had no transportation. Assessed goals,ROM , TUG, MMT. BIL knee ROM WFLS, hip MMT has gotten stronger when tested but still func  weakness and balance issues.TUG still under goal time even without being here for a couple weeks. Pt will continue to benefit from skilled interventions to address balance and strength  as well as gait progression to keep  her as independent as possible as sh elives alone.    OBJECTIVE IMPAIRMENTS: Abnormal gait, cardiopulmonary status limiting activity, decreased activity tolerance, decreased balance, decreased coordination, decreased endurance, decreased mobility, difficulty walking, decreased ROM, decreased strength, impaired flexibility, impaired sensation, and pain.   REHAB POTENTIAL: Good  CLINICAL DECISION MAKING: Stable/uncomplicated  EVALUATION COMPLEXITY: Low   GOALS: Goals reviewed with patient? Yes  SHORT TERM GOALS: Target date: 11/11/23 Independent with initial HEP Goal status: progressing 11/16/23, not doing much at home 12/14/23 Progressing/ doing some 01/14/24  LONG TERM GOALS: Target date: 01/11/24  Independent with advanced HEP or gym program Goal status: ongoing 01/04/24  and 01/14/24  2.  Decrease TUG time to 24 seconds Goal status: REVISED Will decrease TUG to < 20s    32s 11/19/23, 24s MET 12/14/23  3.  Walk 300 feet in 3 minute walk test Goal status: MET 11/23/23  4.  Be able to negotiate stairs and curbs with SBA with one at a time and a handrail Goal status: IN PROGRESS modA still needed 01/04/24  01/14/24  5.  Report able to warm up a meal without sitting Goal status: MET 12/14/23    PLAN:  PT FREQUENCY: 1-2x/week  PT DURATION: 12 weeks  PLANNED INTERVENTIONS: Therapeutic exercises, Therapeutic activity, Neuromuscular re-education, Balance training, Gait training, Patient/Family education, Self Care, Joint mobilization, and Manual therapy  PLAN FOR NEXT SESSION: continue to work on the coordination of the LE's for balance and gait, recert done  Shaye Lagace,ANGIE, PTA 01/14/2024, 5:45 PM

## 2024-01-17 DIAGNOSIS — I2510 Atherosclerotic heart disease of native coronary artery without angina pectoris: Secondary | ICD-10-CM | POA: Diagnosis not present

## 2024-01-17 DIAGNOSIS — E1143 Type 2 diabetes mellitus with diabetic autonomic (poly)neuropathy: Secondary | ICD-10-CM | POA: Diagnosis not present

## 2024-01-17 DIAGNOSIS — N183 Chronic kidney disease, stage 3 unspecified: Secondary | ICD-10-CM | POA: Diagnosis not present

## 2024-01-17 DIAGNOSIS — I10 Essential (primary) hypertension: Secondary | ICD-10-CM | POA: Diagnosis not present

## 2024-01-18 ENCOUNTER — Ambulatory Visit

## 2024-01-18 DIAGNOSIS — R2681 Unsteadiness on feet: Secondary | ICD-10-CM

## 2024-01-18 DIAGNOSIS — R262 Difficulty in walking, not elsewhere classified: Secondary | ICD-10-CM | POA: Diagnosis not present

## 2024-01-18 DIAGNOSIS — M6281 Muscle weakness (generalized): Secondary | ICD-10-CM

## 2024-01-18 NOTE — Therapy (Signed)
 OUTPATIENT PHYSICAL THERAPY LOWER EXTREMITY TREATMENT    Patient Name: Pamela Snyder MRN: [id number] DOB:30-Sep-1945, 78 y.o., female Today's Date: 01/18/2024  END OF SESSION:  PT End of Session - 01/18/24 1524     Visit Number 18    Date for PT Re-Evaluation 02/19/24    PT Start Time 1530    PT Stop Time 1615    PT Time Calculation (min) 45 min                      Past Medical History:  Diagnosis Date   Anxiety    Arthritis    right foot; spine; hands (11/23/2014)   Charcot foot due to diabetes mellitus (HCC)    Depression    GERD (gastroesophageal reflux disease)    Gout    Hyperlipemia    Hypertension    Migraine    hx   Neuropathy    Numbness    Thyroid  goiter 1986   Type II diabetes mellitus (HCC)    Past Surgical History:  Procedure Laterality Date   ABDOMINAL HYSTERECTOMY  1999   w/BSO   ACHILLES TENDON LENGTHENING Right 11/23/2014   ACHILLES TENDON LENGTHENING Right 11/23/2014   Procedure: RIGHT ACHILLES PERCUTANEOUS TENDON LENGTHENING;  Surgeon: Norleen Armor, MD;  Location: [facility];  Service: Orthopedics;  Laterality: Right;   APPENDECTOMY  1963   ARTHRODESIS Right 11/23/2014   mid foot   CARDIAC CATHETERIZATION  08/2004   FOOT ARTHRODESIS Right 11/23/2014   Procedure: RIGHT MID FOOT ARTHRODESIS;  Surgeon: Norleen Armor, MD;  Location: [facility];  Service: Orthopedics;  Laterality: Right;   METATARSAL OSTEOTOMY Right 11/23/2014   Procedure: RIGHT MID FOOT OSTEOTOMY;  Surgeon: Norleen Armor, MD;  Location: [facility];  Service: Orthopedics;  Laterality: Right;   ORIF ANKLE FRACTURE Left 05/16/2021   Procedure: OPEN REDUCTION INTERNAL FIXATION (ORIF) Left ankle lateral malleolus, possible deltoid ligament repair;  Surgeon: Armor Norleen, MD;  Location: [facility];  Service: Orthopedics;  Laterality: Left;   OSTEOTOMY Right 11/23/2014   mid foot   PARTIAL THYMECTOMY  1986   ? side   TONSILLECTOMY  1954   Patient Active Problem List   Diagnosis Date Noted    Closed low lateral malleolus fracture 05/16/2021   Gout 05/16/2021   Hyperlipemia 05/16/2021   Hypertension 05/16/2021   Type II diabetes mellitus (HCC) 05/16/2021   Spinal stenosis of lumbar region 03/10/2019   Cervical myelopathy (HCC) 03/10/2019   Peripheral neuropathy 02/02/2019   Gait abnormality 02/02/2019   Urinary urgency 02/02/2019   Chronic bilateral low back pain without sciatica 02/02/2019   DOE (dyspnea on exertion) 10/05/2017   Bruit of right carotid artery 10/05/2017   Coronary artery calcification seen on CT scan 10/05/2017   Chest pain 10/04/2017   Aortic atherosclerosis (HCC) 10/04/2017   Charcot foot due to diabetes mellitus (HCC) 11/23/2014    PCP: Katina Pfeiffer, PA  REFERRING PROVIDER: Katina, PA  REFERRING DIAG: poor balance, weakness, difficulty walking  THERAPY DIAG:  Muscle weakness (generalized)  Unsteady gait  Difficulty in walking, not elsewhere classified  Rationale for Evaluation and Treatment: Rehabilitation  ONSET DATE: 09/22/23  SUBJECTIVE:   SUBJECTIVE STATEMENT: Doing fine, doing good.    Had a fall in November 2022 and sustained a left Malleolar fracture with ORIF.  She was suffering from neuropathy, had just started to use a walker but prior to that did not use a device.  We saw her April 2024 and she progressed  well with walking, strength and function.  She returns today due to weakness, difficulty walking, decreased endurance, and unsteady gait.  Denies any recent falls.  She reports that she is walking much slower and is a little more unsteady, feels weak, reports that she is taking Lyrica  and Cymbalta.  PERTINENT HISTORY: See above PAIN:  Are you having pain? Yes: NPRS scale: 3/10 Pain location: low back Pain description: tight, stabbing Aggravating factors: being on feet longer, twisting pain pain up to 6/10 Relieving factors: rest pain will go to 0/10  PRECAUTIONS: Fall  WEIGHT BEARING RESTRICTIONS: No  FALLS:   Has patient fallen in last 6 months? No, just unsteady  LIVING ENVIRONMENT: Lives with: lives alone Lives in: House/apartment Stairs: Yes: Internal: 12 steps; can reach both Has following equipment at home: Vannie - 2 wheeled, Tour manager, and Grab bars  OCCUPATION: retired  PLOF: Independent with household mobility with device and Needs assistance with homemaking has two daughters that check on her every other day, she does some cooking, does some housework  PATIENT GOALS: may go to the beach in the fall, stay living at home independently  NEXT MD VISIT: none scheduled  OBJECTIVE:   DIAGNOSTIC FINDINGS: none  COGNITION: Overall cognitive status: Within functional limits for tasks assessed     SENSATION: Mid shin down very limited sensation light touch can feel some deeper touch  EDEMA:  Very mild at the ankles  MUSCLE LENGTH: Mild tightness in the HS  POSTURE: rounded shoulders and forward head  PALPATION: Tight and tender in the lumbar paraspinals  LOWER EXTREMITY ROM:  Active ROM Right eval Left eval  Hip flexion 80 80  Hip extension    Hip abduction    Hip adduction    Hip internal rotation    Hip external rotation    Knee flexion    Knee extension 0 0  Ankle dorsiflexion 0 0  Ankle plantarflexion    Ankle inversion    Ankle eversion     (Blank rows = not tested)  LOWER EXTREMITY MMT:  MMT Right eval Left eval RT /left  01/14/24  Hip flexion 4 4- 4+/4+  Hip extension 4- 4- 4/4  Hip abduction 4- 4- 4/4  Hip adduction     Hip internal rotation     Hip external rotation     Knee flexion 4- 4- WFLS BIL  Knee extension 4 34 WFLs BIL  Ankle dorsiflexion 1+ 0   Ankle plantarflexion 1 1   Ankle inversion 1 0   Ankle eversion 0 0    (Blank rows = not tested) FUNCTIONAL TESTS:  TUG: 37 seconds with FWW at evaluation walk test with CGA and FWW = 158 feet at evaluation, had to stop due to fatigue Unable to stand without holding onto  something  GAIT: Distance walked: 158 feet Assistive device utilized: Walker - 2 wheeled Level of assistance: SBA to start and then needs CGA Comments: foot drop bilaterally, decreased foot control, some hip drop on the left side, when fatigued poor safety with transfers   TODAY'S TREATMENT:  DATE:   01/18/24 NuStep L5x53mins Leg ext 10# 2x12 HS curls 25# 2x12 Feet on ball K2C, rotation, isometric abs Ball squeeze 2x10 Hip abduction green 2x10 Hip flexion green 2x10   01/14/23 Nustep L 5 8 min Checked goals and  MMT and ROM ( see above chart) TUG with RW  20.21 sec Black tband trunk flexion 20 x Lat pull 25# 2 sets 10 Seated EOM wt ball toss for core Seated EOM 3# bar ball hitting LAQ 3# 2 sets 10 Standing with RW 3# ankle wts marching,sl hip flex,ext and abd 10x each CGA and guarding on knees to prevent buckling. ABD  challenging Ball toss standing min -mod A with multi LOB back onto mat 12 x   01/04/24 Nustep level 5 x 8 minutes Partial sit ups Feet on ball K2C, rotation, bridges, isometric abs Ball b/n knees bridge LAts 25#  Black tband trunk flexion Leg press 20# both, then single legs 2x5 each Leg curls 25#  12/31/23 NuStep L5x33mins  Stepping over obstacles in bars forwards and side steps  Balance on airex in bars 2 finger support  Step ups on airex in bars x5 each side  Lat pull downs 20# 3x10 Chest press 10# 3x10  12/28/23 Level 5 x 10 minutes Side stepping Marching, hip abduction and extension in Pbars 35# leg curls Leg extension 10# Feet on ball K2C, rotation, bridges, isometric abs Green tband clamshells Ball b/n knees squeeze Green tband supine hip extension   12/24/23 NuStep L5x30mins  Single arm lat pull standing 10#- one hand on walker  Leg ext 10# 2x12 HS curls 25# 2x12 Standing on airex marching holding on the  walker Bicep curls 3# 2x10  Alternating punches with 2# 2x10   12/21/23 Nustep level 5 x 6 minutes 10# chest press 20# lats Black tband back flexion 20# leg press Partial sit ups 5# marches 5# hip abduction 5# single arm row and extension holding walker Green tband clamshells 3# LAQ Seated 3# marches Sidestepping 4# biceps  12/17/23 NuStep L5x71mins  Modified sit ups yellow ball 2x10 Standing marches 5#  Lateral step taps 4 with 5#  Volleyball  Lat pull down 25# 2x10 blackTB flexion 2x10  12/14/23 NuStep L5x88mins Box taps 4 with RW  STS w/RW, 2x10 some use of table against legs  OHP 3# 2x10 RUE, 2x5 LUE  HS curls 25# 2x10 Leg ext 10# 2x10 Lat pull down 20# 2x10    11/26/23 NuStep L5x76mins  Walk outside out front door to sign and back in Pball flexion x10 Standing with legs against table rows and ext 2x10 Lat pull down 20# 2x10  blackTB flexion 2x10  11/23/23 3 min walk test- able to do 323ft in 2 min 40s  NuStep L5x73mins Leg ext 10# 2x10 HS curls 25# 2x10 Side step in bars  Mini squats in bars x10   11/19/23 NuStep L5x56mins  TUG- 32s  Lat pull down 20# 2x10  Feet on pball rotations, knees to chest x10 Bridges x10 Passive LE stretching- HS, glutes, piriformis Isometric abs x10 3s holds  AB roll small crunches x10 SLR x10, SLR 1.5# x10   11/16/23 Nustep level 5 x 7 minutes LEg curls 20# 2x10 Leg extension 10# 2x12 Lat pulls 20# 2x12 Chest press 10# 2x12 Leg press 20# Seated voilley ball Seated green tband clamshells  PATIENT EDUCATION:  Education details: POC Person educated: Patient Education method: Explanation Education comprehension: verbalized understanding  HOME EXERCISE PROGRAM: TBD  ASSESSMENT:  CLINICAL IMPRESSION: patient  returns with ongoing complaints of weakness and balance problems. Continued working on LE strengthening. She reports her LLE feels very heavy when she is walking. Thinks it is due to weakness. She reports  doing exercises at home because she felt her balance was getting worse. Pt will continue to benefit from skilled interventions to address balance and strength as well as gait progression to keep  her as independent as possible as she lives alone.    OBJECTIVE IMPAIRMENTS: Abnormal gait, cardiopulmonary status limiting activity, decreased activity tolerance, decreased balance, decreased coordination, decreased endurance, decreased mobility, difficulty walking, decreased ROM, decreased strength, impaired flexibility, impaired sensation, and pain.   REHAB POTENTIAL: Good  CLINICAL DECISION MAKING: Stable/uncomplicated  EVALUATION COMPLEXITY: Low   GOALS: Goals reviewed with patient? Yes  SHORT TERM GOALS: Target date: 11/11/23 Independent with initial HEP Goal status: progressing 11/16/23, not doing much at home 12/14/23 Progressing/ doing some 01/14/24  LONG TERM GOALS: Target date: 01/11/24  Independent with advanced HEP or gym program Goal status: ongoing 01/04/24  and 01/14/24  2.  Decrease TUG time to 24 seconds Goal status: REVISED Will decrease TUG to < 20s    32s 11/19/23, 24s MET 12/14/23  3.  Walk 300 feet in 3 minute walk test Goal status: MET 11/23/23  4.  Be able to negotiate stairs and curbs with SBA with one at a time and a handrail Goal status: IN PROGRESS modA still needed 01/04/24  01/14/24  5.  Report able to warm up a meal without sitting Goal status: MET 12/14/23    PLAN:  PT FREQUENCY: 1-2x/week  PT DURATION: 12 weeks  PLANNED INTERVENTIONS: Therapeutic exercises, Therapeutic activity, Neuromuscular re-education, Balance training, Gait training, Patient/Family education, Self Care, Joint mobilization, and Manual therapy  PLAN FOR NEXT SESSION: continue to work on the coordination of the LE's for balance and gait, recert done  [employer], PT 01/18/2024, 4:15 PM

## 2024-01-21 ENCOUNTER — Ambulatory Visit: Admitting: Physical Therapy

## 2024-01-21 ENCOUNTER — Encounter: Payer: Self-pay | Admitting: Physical Therapy

## 2024-01-21 DIAGNOSIS — R2681 Unsteadiness on feet: Secondary | ICD-10-CM

## 2024-01-21 DIAGNOSIS — R262 Difficulty in walking, not elsewhere classified: Secondary | ICD-10-CM | POA: Diagnosis not present

## 2024-01-21 DIAGNOSIS — M6281 Muscle weakness (generalized): Secondary | ICD-10-CM | POA: Diagnosis not present

## 2024-01-21 NOTE — Therapy (Signed)
 OUTPATIENT PHYSICAL THERAPY LOWER EXTREMITY TREATMENT    Patient Name: Pamela Snyder MRN: [id number] DOB:June 17, 1946, 78 y.o., female Today's Date: 01/21/2024  END OF SESSION:  PT End of Session - 01/21/24 1659     Visit Number 19    Date for PT Re-Evaluation 02/19/24    Authorization Type Medicare    PT Start Time 1657    PT Stop Time 1743    PT Time Calculation (min) 46 min    Activity Tolerance Patient tolerated treatment well    Behavior During Therapy [facility] for tasks assessed/performed                      Past Medical History:  Diagnosis Date   Anxiety    Arthritis    right foot; spine; hands (11/23/2014)   Charcot foot due to diabetes mellitus (HCC)    Depression    GERD (gastroesophageal reflux disease)    Gout    Hyperlipemia    Hypertension    Migraine    hx   Neuropathy    Numbness    Thyroid  goiter 1986   Type II diabetes mellitus (HCC)    Past Surgical History:  Procedure Laterality Date   ABDOMINAL HYSTERECTOMY  1999   w/BSO   ACHILLES TENDON LENGTHENING Right 11/23/2014   ACHILLES TENDON LENGTHENING Right 11/23/2014   Procedure: RIGHT ACHILLES PERCUTANEOUS TENDON LENGTHENING;  Surgeon: Norleen Armor, MD;  Location: [facility];  Service: Orthopedics;  Laterality: Right;   APPENDECTOMY  1963   ARTHRODESIS Right 11/23/2014   mid foot   CARDIAC CATHETERIZATION  08/2004   FOOT ARTHRODESIS Right 11/23/2014   Procedure: RIGHT MID FOOT ARTHRODESIS;  Surgeon: Norleen Armor, MD;  Location: [facility];  Service: Orthopedics;  Laterality: Right;   METATARSAL OSTEOTOMY Right 11/23/2014   Procedure: RIGHT MID FOOT OSTEOTOMY;  Surgeon: Norleen Armor, MD;  Location: [facility];  Service: Orthopedics;  Laterality: Right;   ORIF ANKLE FRACTURE Left 05/16/2021   Procedure: OPEN REDUCTION INTERNAL FIXATION (ORIF) Left ankle lateral malleolus, possible deltoid ligament repair;  Surgeon: Armor Norleen, MD;  Location: [facility];  Service: Orthopedics;  Laterality: Left;   OSTEOTOMY  Right 11/23/2014   mid foot   PARTIAL THYMECTOMY  1986   ? side   TONSILLECTOMY  1954   Patient Active Problem List   Diagnosis Date Noted   Closed low lateral malleolus fracture 05/16/2021   Gout 05/16/2021   Hyperlipemia 05/16/2021   Hypertension 05/16/2021   Type II diabetes mellitus (HCC) 05/16/2021   Spinal stenosis of lumbar region 03/10/2019   Cervical myelopathy (HCC) 03/10/2019   Peripheral neuropathy 02/02/2019   Gait abnormality 02/02/2019   Urinary urgency 02/02/2019   Chronic bilateral low back pain without sciatica 02/02/2019   DOE (dyspnea on exertion) 10/05/2017   Bruit of right carotid artery 10/05/2017   Coronary artery calcification seen on CT scan 10/05/2017   Chest pain 10/04/2017   Aortic atherosclerosis (HCC) 10/04/2017   Charcot foot due to diabetes mellitus (HCC) 11/23/2014    PCP: Katina Pfeiffer, PA  REFERRING PROVIDER: Katina, PA  REFERRING DIAG: poor balance, weakness, difficulty walking  THERAPY DIAG:  Muscle weakness (generalized)  Unsteady gait  Difficulty in walking, not elsewhere classified  Rationale for Evaluation and Treatment: Rehabilitation  ONSET DATE: 09/22/23  SUBJECTIVE:   SUBJECTIVE STATEMENT: Okay, I have been a little unsteady yesterday and today   Had a fall in November 2022 and sustained a left Malleolar fracture with ORIF.  She was suffering from neuropathy, had just started to use a walker but prior to that did not use a device.  We saw her April 2024 and she progressed well with walking, strength and function.  She returns today due to weakness, difficulty walking, decreased endurance, and unsteady gait.  Denies any recent falls.  She reports that she is walking much slower and is a little more unsteady, feels weak, reports that she is taking Lyrica  and Cymbalta.  PERTINENT HISTORY: See above PAIN:  Are you having pain? Yes: NPRS scale: 3/10 Pain location: low back Pain description: tight,  stabbing Aggravating factors: being on feet longer, twisting pain pain up to 6/10 Relieving factors: rest pain will go to 0/10  PRECAUTIONS: Fall  WEIGHT BEARING RESTRICTIONS: No  FALLS:  Has patient fallen in last 6 months? No, just unsteady  LIVING ENVIRONMENT: Lives with: lives alone Lives in: House/apartment Stairs: Yes: Internal: 12 steps; can reach both Has following equipment at home: Vannie - 2 wheeled, Tour manager, and Grab bars  OCCUPATION: retired  PLOF: Independent with household mobility with device and Needs assistance with homemaking has two daughters that check on her every other day, she does some cooking, does some housework  PATIENT GOALS: may go to the beach in the fall, stay living at home independently  NEXT MD VISIT: none scheduled  OBJECTIVE:   DIAGNOSTIC FINDINGS: none  COGNITION: Overall cognitive status: Within functional limits for tasks assessed     SENSATION: Mid shin down very limited sensation light touch can feel some deeper touch  EDEMA:  Very mild at the ankles  MUSCLE LENGTH: Mild tightness in the HS  POSTURE: rounded shoulders and forward head  PALPATION: Tight and tender in the lumbar paraspinals  LOWER EXTREMITY ROM:  Active ROM Right eval Left eval  Hip flexion 80 80  Hip extension    Hip abduction    Hip adduction    Hip internal rotation    Hip external rotation    Knee flexion    Knee extension 0 0  Ankle dorsiflexion 0 0  Ankle plantarflexion    Ankle inversion    Ankle eversion     (Blank rows = not tested)  LOWER EXTREMITY MMT:  MMT Right eval Left eval RT /left  01/14/24  Hip flexion 4 4- 4+/4+  Hip extension 4- 4- 4/4  Hip abduction 4- 4- 4/4  Hip adduction     Hip internal rotation     Hip external rotation     Knee flexion 4- 4- WFLS BIL  Knee extension 4 34 WFLs BIL  Ankle dorsiflexion 1+ 0   Ankle plantarflexion 1 1   Ankle inversion 1 0   Ankle eversion 0 0    (Blank rows = not  tested) FUNCTIONAL TESTS:  TUG: 37 seconds with FWW at evaluation walk test with CGA and FWW = 158 feet at evaluation, had to stop due to fatigue Unable to stand without holding onto something  GAIT: Distance walked: 158 feet Assistive device utilized: Walker - 2 wheeled Level of assistance: SBA to start and then needs CGA Comments: foot drop bilaterally, decreased foot control, some hip drop on the left side, when fatigued poor safety with transfers   TODAY'S TREATMENT:  DATE:   01/21/24 Nustep level 5 x 10 minutes Leg curls 25# 3x10 Leg ext 10# 3x10 Lat pulls 20# Partial sit ups Standing 2.5# marching and hip abduction Leg press 20# Green tband clamshells Ball b/n knees squeeze  01/18/24 NuStep L5x45mins Leg ext 10# 2x12 HS curls 25# 2x12 Feet on ball K2C, rotation, isometric abs Ball squeeze 2x10 Hip abduction green 2x10 Hip flexion green 2x10   01/14/23 Nustep L 5 8 min Checked goals and  MMT and ROM ( see above chart) TUG with RW  20.21 sec Black tband trunk flexion 20 x Lat pull 25# 2 sets 10 Seated EOM wt ball toss for core Seated EOM 3# bar ball hitting LAQ 3# 2 sets 10 Standing with RW 3# ankle wts marching,sl hip flex,ext and abd 10x each CGA and guarding on knees to prevent buckling. ABD  challenging Ball toss standing min -mod A with multi LOB back onto mat 12 x   01/04/24 Nustep level 5 x 8 minutes Partial sit ups Feet on ball K2C, rotation, bridges, isometric abs Ball b/n knees bridge LAts 25#  Black tband trunk flexion Leg press 20# both, then single legs 2x5 each Leg curls 25#  12/31/23 NuStep L5x54mins  Stepping over obstacles in bars forwards and side steps  Balance on airex in bars 2 finger support  Step ups on airex in bars x5 each side  Lat pull downs 20# 3x10 Chest press 10# 3x10  12/28/23 Level 5 x 10  minutes Side stepping Marching, hip abduction and extension in Pbars 35# leg curls Leg extension 10# Feet on ball K2C, rotation, bridges, isometric abs Green tband clamshells Ball b/n knees squeeze Green tband supine hip extension   12/24/23 NuStep L5x73mins  Single arm lat pull standing 10#- one hand on walker  Leg ext 10# 2x12 HS curls 25# 2x12 Standing on airex marching holding on the walker Bicep curls 3# 2x10  Alternating punches with 2# 2x10   12/21/23 Nustep level 5 x 6 minutes 10# chest press 20# lats Black tband back flexion 20# leg press Partial sit ups 5# marches 5# hip abduction 5# single arm row and extension holding walker Green tband clamshells 3# LAQ Seated 3# marches Sidestepping 4# biceps  12/17/23 NuStep L5x33mins  Modified sit ups yellow ball 2x10 Standing marches 5#  Lateral step taps 4 with 5#  Volleyball  Lat pull down 25# 2x10 blackTB flexion 2x10  12/14/23 NuStep L5x12mins Box taps 4 with RW  STS w/RW, 2x10 some use of table against legs  OHP 3# 2x10 RUE, 2x5 LUE  HS curls 25# 2x10 Leg ext 10# 2x10 Lat pull down 20# 2x10    11/26/23 NuStep L5x5mins  Walk outside out front door to sign and back in Pball flexion x10 Standing with legs against table rows and ext 2x10 Lat pull down 20# 2x10  blackTB flexion 2x10  11/23/23 3 min walk test- able to do 324ft in 2 min 40s  NuStep L5x46mins Leg ext 10# 2x10 HS curls 25# 2x10 Side step in bars  Mini squats in bars x10   11/19/23 NuStep L5x47mins  TUG- 32s  Lat pull down 20# 2x10  Feet on pball rotations, knees to chest x10 Bridges x10 Passive LE stretching- HS, glutes, piriformis Isometric abs x10 3s holds  AB roll small crunches x10 SLR x10, SLR 1.5# x10   11/16/23 Nustep level 5 x 7 minutes LEg curls 20# 2x10 Leg extension 10# 2x12 Lat pulls 20# 2x12 Chest press 10# 2x12  Leg press 20# Seated voilley ball Seated green tband clamshells  PATIENT EDUCATION:  Education  details: POC Person educated: Patient Education method: Explanation Education comprehension: verbalized understanding  HOME EXERCISE PROGRAM: TBD  ASSESSMENT:  CLINICAL IMPRESSION: Patient had some difficulty today with the standing marching, due to the inability to control the feet , during the marching exercise she was putting the right foot down on top of the left, requiring CGA to Min A to correct.  She really does well with the exercises for strength but due to the neuropathy really cannot do much balance activties. Pt will continue to benefit from skilled interventions to address balance and strength as well as gait progression to keep  her as independent as possible as she lives alone.  OBJECTIVE IMPAIRMENTS: Abnormal gait, cardiopulmonary status limiting activity, decreased activity tolerance, decreased balance, decreased coordination, decreased endurance, decreased mobility, difficulty walking, decreased ROM, decreased strength, impaired flexibility, impaired sensation, and pain.   REHAB POTENTIAL: Good  CLINICAL DECISION MAKING: Stable/uncomplicated  EVALUATION COMPLEXITY: Low   GOALS: Goals reviewed with patient? Yes  SHORT TERM GOALS: Target date: 11/11/23 Independent with initial HEP Goal status: progressing 11/16/23, not doing much at home 12/14/23 Progressing/ doing some 01/14/24  LONG TERM GOALS: Target date: 01/11/24  Independent with advanced HEP or gym program Goal status: ongoing 01/04/24  and 01/14/24  2.  Decrease TUG time to 24 seconds Goal status: REVISED Will decrease TUG to < 20s 01/21/24   32s 11/19/23, 24s MET 12/14/23  3.  Walk 300 feet in 3 minute walk test Goal status: MET 11/23/23  4.  Be able to negotiate stairs and curbs with SBA with one at a time and a handrail Goal status: IN PROGRESS modA still needed 01/04/24  01/14/24, 01/21/24  5.  Report able to warm up a meal without sitting Goal status: MET 12/14/23    PLAN:  PT FREQUENCY: 1-2x/week  PT  DURATION: 12 weeks  PLANNED INTERVENTIONS: Therapeutic exercises, Therapeutic activity, Neuromuscular re-education, Balance training, Gait training, Patient/Family education, Self Care, Joint mobilization, and Manual therapy  PLAN FOR NEXT SESSION: continue to work on the coordination of the LE's for balance and gait, recert done  [employer], PT 01/21/2024, 5:02 PM

## 2024-01-25 ENCOUNTER — Ambulatory Visit

## 2024-01-25 DIAGNOSIS — R2681 Unsteadiness on feet: Secondary | ICD-10-CM | POA: Diagnosis not present

## 2024-01-25 DIAGNOSIS — M6281 Muscle weakness (generalized): Secondary | ICD-10-CM | POA: Diagnosis not present

## 2024-01-25 DIAGNOSIS — R262 Difficulty in walking, not elsewhere classified: Secondary | ICD-10-CM

## 2024-01-25 NOTE — Therapy (Signed)
 OUTPATIENT PHYSICAL THERAPY LOWER EXTREMITY TREATMENT Progress Note Reporting Period 12/14/23 to 01/25/24  See note below for Objective Data and Assessment of Progress/Goals.       Patient Name: Pamela Snyder MRN: [id number] DOB:1946-01-11, 78 y.o., female Today's Date: 01/25/2024  END OF SESSION:  PT End of Session - 01/25/24 1528     Visit Number 20    Date for PT Re-Evaluation 02/19/24    Authorization Type Medicare    PT Start Time 1530    PT Stop Time 1615    PT Time Calculation (min) 45 min    Activity Tolerance Patient tolerated treatment well    Behavior During Therapy [facility] for tasks assessed/performed                       Past Medical History:  Diagnosis Date   Anxiety    Arthritis    right foot; spine; hands (11/23/2014)   Charcot foot due to diabetes mellitus (HCC)    Depression    GERD (gastroesophageal reflux disease)    Gout    Hyperlipemia    Hypertension    Migraine    hx   Neuropathy    Numbness    Thyroid  goiter 1986   Type II diabetes mellitus (HCC)    Past Surgical History:  Procedure Laterality Date   ABDOMINAL HYSTERECTOMY  1999   w/BSO   ACHILLES TENDON LENGTHENING Right 11/23/2014   ACHILLES TENDON LENGTHENING Right 11/23/2014   Procedure: RIGHT ACHILLES PERCUTANEOUS TENDON LENGTHENING;  Surgeon: Norleen Armor, MD;  Location: [facility] OR;  Service: Orthopedics;  Laterality: Right;   APPENDECTOMY  1963   ARTHRODESIS Right 11/23/2014   mid foot   CARDIAC CATHETERIZATION  08/2004   FOOT ARTHRODESIS Right 11/23/2014   Procedure: RIGHT MID FOOT ARTHRODESIS;  Surgeon: Norleen Armor, MD;  Location: [facility] OR;  Service: Orthopedics;  Laterality: Right;   METATARSAL OSTEOTOMY Right 11/23/2014   Procedure: RIGHT MID FOOT OSTEOTOMY;  Surgeon: Norleen Armor, MD;  Location: [facility] OR;  Service: Orthopedics;  Laterality: Right;   ORIF ANKLE FRACTURE Left 05/16/2021   Procedure: OPEN REDUCTION INTERNAL FIXATION (ORIF) Left ankle lateral malleolus,  possible deltoid ligament repair;  Surgeon: Armor Norleen, MD;  Location: [facility] OR;  Service: Orthopedics;  Laterality: Left;   OSTEOTOMY Right 11/23/2014   mid foot   PARTIAL THYMECTOMY  1986   ? side   TONSILLECTOMY  1954   Patient Active Problem List   Diagnosis Date Noted   Closed low lateral malleolus fracture 05/16/2021   Gout 05/16/2021   Hyperlipemia 05/16/2021   Hypertension 05/16/2021   Type II diabetes mellitus (HCC) 05/16/2021   Spinal stenosis of lumbar region 03/10/2019   Cervical myelopathy (HCC) 03/10/2019   Peripheral neuropathy 02/02/2019   Gait abnormality 02/02/2019   Urinary urgency 02/02/2019   Chronic bilateral low back pain without sciatica 02/02/2019   DOE (dyspnea on exertion) 10/05/2017   Bruit of right carotid artery 10/05/2017   Coronary artery calcification seen on CT scan 10/05/2017   Chest pain 10/04/2017   Aortic atherosclerosis (HCC) 10/04/2017   Charcot foot due to diabetes mellitus (HCC) 11/23/2014    PCP: Katina Pfeiffer, PA  REFERRING PROVIDER: Katina, PA  REFERRING DIAG: poor balance, weakness, difficulty walking  THERAPY DIAG:  Muscle weakness (generalized)  Unsteady gait  Difficulty in walking, not elsewhere classified  Rationale for Evaluation and Treatment: Rehabilitation  ONSET DATE: 09/22/23  SUBJECTIVE:   SUBJECTIVE STATEMENT: I am a little shaky  today, getting ready today felt like I ran a race.    Had a fall in November 2022 and sustained a left Malleolar fracture with ORIF.  She was suffering from neuropathy, had just started to use a walker but prior to that did not use a device.  We saw her April 2024 and she progressed well with walking, strength and function.  She returns today due to weakness, difficulty walking, decreased endurance, and unsteady gait.  Denies any recent falls.  She reports that she is walking much slower and is a little more unsteady, feels weak, reports that she is taking Lyrica  and  Cymbalta.  PERTINENT HISTORY: See above PAIN:  Are you having pain? Yes: NPRS scale: 3/10 Pain location: low back Pain description: tight, stabbing Aggravating factors: being on feet longer, twisting pain pain up to 6/10 Relieving factors: rest pain will go to 0/10  PRECAUTIONS: Fall  WEIGHT BEARING RESTRICTIONS: No  FALLS:  Has patient fallen in last 6 months? No, just unsteady  LIVING ENVIRONMENT: Lives with: lives alone Lives in: House/apartment Stairs: Yes: Internal: 12 steps; can reach both Has following equipment at home: Vannie - 2 wheeled, Tour manager, and Grab bars  OCCUPATION: retired  PLOF: Independent with household mobility with device and Needs assistance with homemaking has two daughters that check on her every other day, she does some cooking, does some housework  PATIENT GOALS: may go to the beach in the fall, stay living at home independently  NEXT MD VISIT: none scheduled  OBJECTIVE:   DIAGNOSTIC FINDINGS: none  COGNITION: Overall cognitive status: Within functional limits for tasks assessed     SENSATION: Mid shin down very limited sensation light touch can feel some deeper touch  EDEMA:  Very mild at the ankles  MUSCLE LENGTH: Mild tightness in the HS  POSTURE: rounded shoulders and forward head  PALPATION: Tight and tender in the lumbar paraspinals  LOWER EXTREMITY ROM:  Active ROM Right eval Left eval  Hip flexion 80 80  Hip extension    Hip abduction    Hip adduction    Hip internal rotation    Hip external rotation    Knee flexion    Knee extension 0 0  Ankle dorsiflexion 0 0  Ankle plantarflexion    Ankle inversion    Ankle eversion     (Blank rows = not tested)  LOWER EXTREMITY MMT:  MMT Right eval Left eval RT /left  01/14/24  Hip flexion 4 4- 4+/4+  Hip extension 4- 4- 4/4  Hip abduction 4- 4- 4/4  Hip adduction     Hip internal rotation     Hip external rotation     Knee flexion 4- 4- WFLS BIL  Knee  extension 4 34 WFLs BIL  Ankle dorsiflexion 1+ 0   Ankle plantarflexion 1 1   Ankle inversion 1 0   Ankle eversion 0 0    (Blank rows = not tested) FUNCTIONAL TESTS:  TUG: 37 seconds with FWW at evaluation walk test with CGA and FWW = 158 feet at evaluation, had to stop due to fatigue Unable to stand without holding onto something  GAIT: Distance walked: 158 feet Assistive device utilized: Walker - 2 wheeled Level of assistance: SBA to start and then needs CGA Comments: foot drop bilaterally, decreased foot control, some hip drop on the left side, when fatigued poor safety with transfers   TODAY'S TREATMENT:  DATE:   01/25/24 Recheck goals  NuStep L5x76mins  Leg ext 10# 3x10 HS curls 25# 3x10 STS x5- very hard for her  Box taps forward and sideways 5# ankle weight Chest press with yellow ball 2x10  01/21/24 Nustep level 5 x 10 minutes Leg curls 25# 3x10 Leg ext 10# 3x10 Lat pulls 20# Partial sit ups Standing 2.5# marching and hip abduction Leg press 20# Green tband clamshells Ball b/n knees squeeze  01/18/24 NuStep L5x59mins Leg ext 10# 2x12 HS curls 25# 2x12 Feet on ball K2C, rotation, isometric abs Ball squeeze 2x10 Hip abduction green 2x10 Hip flexion green 2x10   01/14/23 Nustep L 5 8 min Checked goals and  MMT and ROM ( see above chart) TUG with RW  20.21 sec Black tband trunk flexion 20 x Lat pull 25# 2 sets 10 Seated EOM wt ball toss for core Seated EOM 3# bar ball hitting LAQ 3# 2 sets 10 Standing with RW 3# ankle wts marching,sl hip flex,ext and abd 10x each CGA and guarding on knees to prevent buckling. ABD  challenging Ball toss standing min -mod A with multi LOB back onto mat 12 x   01/04/24 Nustep level 5 x 8 minutes Partial sit ups Feet on ball K2C, rotation, bridges, isometric abs Ball b/n knees bridge LAts 25#   Black tband trunk flexion Leg press 20# both, then single legs 2x5 each Leg curls 25#  12/31/23 NuStep L5x9mins  Stepping over obstacles in bars forwards and side steps  Balance on airex in bars 2 finger support  Step ups on airex in bars x5 each side  Lat pull downs 20# 3x10 Chest press 10# 3x10  12/28/23 Level 5 x 10 minutes Side stepping Marching, hip abduction and extension in Pbars 35# leg curls Leg extension 10# Feet on ball K2C, rotation, bridges, isometric abs Green tband clamshells Ball b/n knees squeeze Green tband supine hip extension   12/24/23 NuStep L5x71mins  Single arm lat pull standing 10#- one hand on walker  Leg ext 10# 2x12 HS curls 25# 2x12 Standing on airex marching holding on the walker Bicep curls 3# 2x10  Alternating punches with 2# 2x10   12/21/23 Nustep level 5 x 6 minutes 10# chest press 20# lats Black tband back flexion 20# leg press Partial sit ups 5# marches 5# hip abduction 5# single arm row and extension holding walker Green tband clamshells 3# LAQ Seated 3# marches Sidestepping 4# biceps  12/17/23 NuStep L5x2mins  Modified sit ups yellow ball 2x10 Standing marches 5#  Lateral step taps 4 with 5#  Volleyball  Lat pull down 25# 2x10 blackTB flexion 2x10  12/14/23 NuStep L5x45mins Box taps 4 with RW  STS w/RW, 2x10 some use of table against legs  OHP 3# 2x10 RUE, 2x5 LUE  HS curls 25# 2x10 Leg ext 10# 2x10 Lat pull down 20# 2x10    11/26/23 NuStep L5x40mins  Walk outside out front door to sign and back in Pball flexion x10 Standing with legs against table rows and ext 2x10 Lat pull down 20# 2x10  blackTB flexion 2x10  11/23/23 3 min walk test- able to do 366ft in 2 min 40s  NuStep L5x52mins Leg ext 10# 2x10 HS curls 25# 2x10 Side step in bars  Mini squats in bars x10   11/19/23 NuStep L5x4mins  TUG- 32s  Lat pull down 20# 2x10  Feet on pball rotations, knees to chest x10 Bridges x10 Passive LE stretching-  HS, glutes, piriformis Isometric abs x10  3s holds  AB roll small crunches x10 SLR x10, SLR 1.5# x10   11/16/23 Nustep level 5 x 7 minutes LEg curls 20# 2x10 Leg extension 10# 2x12 Lat pulls 20# 2x12 Chest press 10# 2x12 Leg press 20# Seated voilley ball Seated green tband clamshells  PATIENT EDUCATION:  Education details: POC Person educated: Patient Education method: Explanation Education comprehension: verbalized understanding  HOME EXERCISE PROGRAM: TBD  ASSESSMENT:  CLINICAL IMPRESSION: Patient returns for 20th visit. She has met most of her goals at this point. Stair goal is still difficult and she reports it has gotten harder and she is unable to do without 2 rails and stepping with both feet on each step. The LLE is still weak and she has to help lift it up in/out of machines and the car. Added in a goal for her to be able to lift the leg without support.   had some difficulty today with the standing marching, due to the inability to control the feet , during the marching exercise she was putting the right foot down on top of the left, requiring CGA to Min A to correct.  She really does well with the exercises for strength but due to the neuropathy really cannot do much balance activties. Pt will continue to benefit from skilled interventions to address balance and strength as well as gait progression to keep  her as independent as possible as she lives alone.  OBJECTIVE IMPAIRMENTS: Abnormal gait, cardiopulmonary status limiting activity, decreased activity tolerance, decreased balance, decreased coordination, decreased endurance, decreased mobility, difficulty walking, decreased ROM, decreased strength, impaired flexibility, impaired sensation, and pain.   REHAB POTENTIAL: Good  CLINICAL DECISION MAKING: Stable/uncomplicated  EVALUATION COMPLEXITY: Low   GOALS: Goals reviewed with patient? Yes  SHORT TERM GOALS: Target date: 11/11/23 Independent with initial  HEP Goal status: progressing 11/16/23, not doing much at home 12/14/23 Progressing/ doing some 01/14/24  LONG TERM GOALS: Target date: 02/19/24  Independent with advanced HEP or gym program Goal status: ongoing 01/04/24  and 01/14/24  2.  Decrease TUG time to 24 seconds Goal status: REVISED 32s 11/19/23, 24s MET 12/14/23 Will decrease TUG to < 20s 01/21/24  Goal status: Ongoing 22s 01/25/23  3.  Walk 300 feet in 3 minute walk test Goal status: MET 11/23/23  4.  Be able to negotiate stairs and curbs with SBA with one at a time and a handrail Goal status: IN PROGRESS modA still needed 01/04/24  01/14/24, 01/21/24, 2 rails, step to 01/25/24  5.  Report able to warm up a meal without sitting Goal status: MET 12/14/23   6. Patient will be able to lift LLE without help to get in and out of exercise machines and the car   Baseline: has to pick up leg to move  Goal status: INITIAL     PLAN:  PT FREQUENCY: 1-2x/week  PT DURATION: 12 weeks  PLANNED INTERVENTIONS: Therapeutic exercises, Therapeutic activity, Neuromuscular re-education, Balance training, Gait training, Patient/Family education, Self Care, Joint mobilization, and Manual therapy  PLAN FOR NEXT SESSION: continue to work on the coordination of the LE's for balance and gait, recert done  [employer], PT 01/25/2024, 4:20 PM

## 2024-01-28 ENCOUNTER — Ambulatory Visit

## 2024-01-28 DIAGNOSIS — R262 Difficulty in walking, not elsewhere classified: Secondary | ICD-10-CM | POA: Diagnosis not present

## 2024-01-28 DIAGNOSIS — M6281 Muscle weakness (generalized): Secondary | ICD-10-CM | POA: Diagnosis not present

## 2024-01-28 DIAGNOSIS — R2681 Unsteadiness on feet: Secondary | ICD-10-CM | POA: Diagnosis not present

## 2024-01-28 NOTE — Therapy (Signed)
 OUTPATIENT PHYSICAL THERAPY LOWER EXTREMITY TREATMENT     Patient Name: Pamela Snyder MRN: [id number] DOB:May 20, 1946, 78 y.o., female Today's Date: 01/28/2024  END OF SESSION:  PT End of Session - 01/28/24 1713     Visit Number 21    Date for PT Re-Evaluation 02/19/24    Authorization Type Medicare    PT Start Time 1711    PT Stop Time 1755    PT Time Calculation (min) 44 min    Activity Tolerance Patient tolerated treatment well    Behavior During Therapy [facility] for tasks assessed/performed                        Past Medical History:  Diagnosis Date   Anxiety    Arthritis    right foot; spine; hands (11/23/2014)   Charcot foot due to diabetes mellitus (HCC)    Depression    GERD (gastroesophageal reflux disease)    Gout    Hyperlipemia    Hypertension    Migraine    hx   Neuropathy    Numbness    Thyroid  goiter 1986   Type II diabetes mellitus (HCC)    Past Surgical History:  Procedure Laterality Date   ABDOMINAL HYSTERECTOMY  1999   w/BSO   ACHILLES TENDON LENGTHENING Right 11/23/2014   ACHILLES TENDON LENGTHENING Right 11/23/2014   Procedure: RIGHT ACHILLES PERCUTANEOUS TENDON LENGTHENING;  Surgeon: Norleen Armor, MD;  Location: [facility] OR;  Service: Orthopedics;  Laterality: Right;   APPENDECTOMY  1963   ARTHRODESIS Right 11/23/2014   mid foot   CARDIAC CATHETERIZATION  08/2004   FOOT ARTHRODESIS Right 11/23/2014   Procedure: RIGHT MID FOOT ARTHRODESIS;  Surgeon: Norleen Armor, MD;  Location: [facility] OR;  Service: Orthopedics;  Laterality: Right;   METATARSAL OSTEOTOMY Right 11/23/2014   Procedure: RIGHT MID FOOT OSTEOTOMY;  Surgeon: Norleen Armor, MD;  Location: [facility] OR;  Service: Orthopedics;  Laterality: Right;   ORIF ANKLE FRACTURE Left 05/16/2021   Procedure: OPEN REDUCTION INTERNAL FIXATION (ORIF) Left ankle lateral malleolus, possible deltoid ligament repair;  Surgeon: Armor Norleen, MD;  Location: [facility] OR;  Service: Orthopedics;  Laterality: Left;    OSTEOTOMY Right 11/23/2014   mid foot   PARTIAL THYMECTOMY  1986   ? side   TONSILLECTOMY  1954   Patient Active Problem List   Diagnosis Date Noted   Closed low lateral malleolus fracture 05/16/2021   Gout 05/16/2021   Hyperlipemia 05/16/2021   Hypertension 05/16/2021   Type II diabetes mellitus (HCC) 05/16/2021   Spinal stenosis of lumbar region 03/10/2019   Cervical myelopathy (HCC) 03/10/2019   Peripheral neuropathy 02/02/2019   Gait abnormality 02/02/2019   Urinary urgency 02/02/2019   Chronic bilateral low back pain without sciatica 02/02/2019   DOE (dyspnea on exertion) 10/05/2017   Bruit of right carotid artery 10/05/2017   Coronary artery calcification seen on CT scan 10/05/2017   Chest pain 10/04/2017   Aortic atherosclerosis (HCC) 10/04/2017   Charcot foot due to diabetes mellitus (HCC) 11/23/2014    PCP: Katina Pfeiffer, PA  REFERRING PROVIDER: Katina, PA  REFERRING DIAG: poor balance, weakness, difficulty walking  THERAPY DIAG:  Muscle weakness (generalized)  Unsteady gait  Difficulty in walking, not elsewhere classified  Rationale for Evaluation and Treatment: Rehabilitation  ONSET DATE: 09/22/23  SUBJECTIVE:   SUBJECTIVE STATEMENT: I am a little shaky today, getting ready today felt like I ran a race.    Had a fall in November 2022  and sustained a left Malleolar fracture with ORIF.  She was suffering from neuropathy, had just started to use a walker but prior to that did not use a device.  We saw her April 2024 and she progressed well with walking, strength and function.  She returns today due to weakness, difficulty walking, decreased endurance, and unsteady gait.  Denies any recent falls.  She reports that she is walking much slower and is a little more unsteady, feels weak, reports that she is taking Lyrica  and Cymbalta.  PERTINENT HISTORY: See above PAIN:  Are you having pain? Yes: NPRS scale: 3/10 Pain location: low back Pain description:  tight, stabbing Aggravating factors: being on feet longer, twisting pain pain up to 6/10 Relieving factors: rest pain will go to 0/10  PRECAUTIONS: Fall  WEIGHT BEARING RESTRICTIONS: No  FALLS:  Has patient fallen in last 6 months? No, just unsteady  LIVING ENVIRONMENT: Lives with: lives alone Lives in: House/apartment Stairs: Yes: Internal: 12 steps; can reach both Has following equipment at home: Vannie - 2 wheeled, Tour manager, and Grab bars  OCCUPATION: retired  PLOF: Independent with household mobility with device and Needs assistance with homemaking has two daughters that check on her every other day, she does some cooking, does some housework  PATIENT GOALS: may go to the beach in the fall, stay living at home independently  NEXT MD VISIT: none scheduled  OBJECTIVE:   DIAGNOSTIC FINDINGS: none  COGNITION: Overall cognitive status: Within functional limits for tasks assessed     SENSATION: Mid shin down very limited sensation light touch can feel some deeper touch  EDEMA:  Very mild at the ankles  MUSCLE LENGTH: Mild tightness in the HS  POSTURE: rounded shoulders and forward head  PALPATION: Tight and tender in the lumbar paraspinals  LOWER EXTREMITY ROM:  Active ROM Right eval Left eval  Hip flexion 80 80  Hip extension    Hip abduction    Hip adduction    Hip internal rotation    Hip external rotation    Knee flexion    Knee extension 0 0  Ankle dorsiflexion 0 0  Ankle plantarflexion    Ankle inversion    Ankle eversion     (Blank rows = not tested)  LOWER EXTREMITY MMT:  MMT Right eval Left eval RT /left  01/14/24  Hip flexion 4 4- 4+/4+  Hip extension 4- 4- 4/4  Hip abduction 4- 4- 4/4  Hip adduction     Hip internal rotation     Hip external rotation     Knee flexion 4- 4- WFLS BIL  Knee extension 4 34 WFLs BIL  Ankle dorsiflexion 1+ 0   Ankle plantarflexion 1 1   Ankle inversion 1 0   Ankle eversion 0 0    (Blank rows =  not tested) FUNCTIONAL TESTS:  TUG: 37 seconds with FWW at evaluation walk test with CGA and FWW = 158 feet at evaluation, had to stop due to fatigue Unable to stand without holding onto something  GAIT: Distance walked: 158 feet Assistive device utilized: Walker - 2 wheeled Level of assistance: SBA to start and then needs CGA Comments: foot drop bilaterally, decreased foot control, some hip drop on the left side, when fatigued poor safety with transfers   TODAY'S TREATMENT:  DATE:   01/28/24 NuStep L5 x11mins  Lat pull down 20# 2x10 Black TB flexion 2x10 Leg ext 10# 2x10  HS curls 25# 2x10  On airex box taps with RW- SBA  Chest press 3# WaTE 2x10    01/25/24 Recheck goals  NuStep L5x74mins  Leg ext 10# 3x10 HS curls 25# 3x10 STS x5- very hard for her  Box taps forward and sideways 5# ankle weight Chest press with yellow ball 2x10  01/21/24 Nustep level 5 x 10 minutes Leg curls 25# 3x10 Leg ext 10# 3x10 Lat pulls 20# Partial sit ups Standing 2.5# marching and hip abduction Leg press 20# Green tband clamshells Ball b/n knees squeeze  01/18/24 NuStep L5x85mins Leg ext 10# 2x12 HS curls 25# 2x12 Feet on ball K2C, rotation, isometric abs Ball squeeze 2x10 Hip abduction green 2x10 Hip flexion green 2x10   01/14/23 Nustep L 5 8 min Checked goals and  MMT and ROM ( see above chart) TUG with RW  20.21 sec Black tband trunk flexion 20 x Lat pull 25# 2 sets 10 Seated EOM wt ball toss for core Seated EOM 3# bar ball hitting LAQ 3# 2 sets 10 Standing with RW 3# ankle wts marching,sl hip flex,ext and abd 10x each CGA and guarding on knees to prevent buckling. ABD  challenging Ball toss standing min -mod A with multi LOB back onto mat 12 x   01/04/24 Nustep level 5 x 8 minutes Partial sit ups Feet on ball K2C, rotation, bridges, isometric  abs Ball b/n knees bridge LAts 25#  Black tband trunk flexion Leg press 20# both, then single legs 2x5 each Leg curls 25#  12/31/23 NuStep L5x34mins  Stepping over obstacles in bars forwards and side steps  Balance on airex in bars 2 finger support  Step ups on airex in bars x5 each side  Lat pull downs 20# 3x10 Chest press 10# 3x10  12/28/23 Level 5 x 10 minutes Side stepping Marching, hip abduction and extension in Pbars 35# leg curls Leg extension 10# Feet on ball K2C, rotation, bridges, isometric abs Green tband clamshells Ball b/n knees squeeze Green tband supine hip extension   12/24/23 NuStep L5x16mins  Single arm lat pull standing 10#- one hand on walker  Leg ext 10# 2x12 HS curls 25# 2x12 Standing on airex marching holding on the walker Bicep curls 3# 2x10  Alternating punches with 2# 2x10   12/21/23 Nustep level 5 x 6 minutes 10# chest press 20# lats Black tband back flexion 20# leg press Partial sit ups 5# marches 5# hip abduction 5# single arm row and extension holding walker Green tband clamshells 3# LAQ Seated 3# marches Sidestepping 4# biceps  12/17/23 NuStep L5x40mins  Modified sit ups yellow ball 2x10 Standing marches 5#  Lateral step taps 4 with 5#  Volleyball  Lat pull down 25# 2x10 blackTB flexion 2x10  12/14/23 NuStep L5x4mins Box taps 4 with RW  STS w/RW, 2x10 some use of table against legs  OHP 3# 2x10 RUE, 2x5 LUE  HS curls 25# 2x10 Leg ext 10# 2x10 Lat pull down 20# 2x10    11/26/23 NuStep L5x81mins  Walk outside out front door to sign and back in Pball flexion x10 Standing with legs against table rows and ext 2x10 Lat pull down 20# 2x10  blackTB flexion 2x10  11/23/23 3 min walk test- able to do 370ft in 2 min 40s  NuStep L5x92mins Leg ext 10# 2x10 HS curls 25# 2x10 Side step in bars  Mini squats in bars x10   11/19/23 NuStep L5x38mins  TUG- 32s  Lat pull down 20# 2x10  Feet on pball rotations, knees to chest  x10 Bridges x10 Passive LE stretching- HS, glutes, piriformis Isometric abs x10 3s holds  AB roll small crunches x10 SLR x10, SLR 1.5# x10   11/16/23 Nustep level 5 x 7 minutes LEg curls 20# 2x10 Leg extension 10# 2x12 Lat pulls 20# 2x12 Chest press 10# 2x12 Leg press 20# Seated voilley ball Seated green tband clamshells  PATIENT EDUCATION:  Education details: POC Person educated: Patient Education method: Explanation Education comprehension: verbalized understanding  HOME EXERCISE PROGRAM: TBD  ASSESSMENT:  CLINICAL IMPRESSION: Patient returns for 20th visit. She has met most of her goals at this point. Stair goal is still difficult and she reports it has gotten harder and she is unable to do without 2 rails and stepping with both feet on each step. The LLE is still weak and she has to help lift it up in/out of machines and the car. Added in a goal for her to be able to lift the leg without support.   had some difficulty today with the standing marching, due to the inability to control the feet , during the marching exercise she was putting the right foot down on top of the left, requiring CGA to Min A to correct.  She really does well with the exercises for strength but due to the neuropathy really cannot do much balance activties. Pt will continue to benefit from skilled interventions to address balance and strength as well as gait progression to keep  her as independent as possible as she lives alone.  OBJECTIVE IMPAIRMENTS: Abnormal gait, cardiopulmonary status limiting activity, decreased activity tolerance, decreased balance, decreased coordination, decreased endurance, decreased mobility, difficulty walking, decreased ROM, decreased strength, impaired flexibility, impaired sensation, and pain.   REHAB POTENTIAL: Good  CLINICAL DECISION MAKING: Stable/uncomplicated  EVALUATION COMPLEXITY: Low   GOALS: Goals reviewed with patient? Yes  SHORT TERM GOALS: Target date:  11/11/23 Independent with initial HEP Goal status: progressing 11/16/23, not doing much at home 12/14/23 Progressing/ doing some 01/14/24  LONG TERM GOALS: Target date: 02/19/24  Independent with advanced HEP or gym program Goal status: ongoing 01/04/24  and 01/14/24  2.  Decrease TUG time to 24 seconds Goal status: REVISED 32s 11/19/23, 24s MET 12/14/23 Will decrease TUG to < 20s 01/21/24  Goal status: Ongoing 22s 01/25/23  3.  Walk 300 feet in 3 minute walk test Goal status: MET 11/23/23  4.  Be able to negotiate stairs and curbs with SBA with one at a time and a handrail Goal status: IN PROGRESS modA still needed 01/04/24  01/14/24, 01/21/24, 2 rails, step to 01/25/24  5.  Report able to warm up a meal without sitting Goal status: MET 12/14/23   6. Patient will be able to lift LLE without help to get in and out of exercise machines and the car   Baseline: has to pick up leg to move  Goal status: INITIAL     PLAN:  PT FREQUENCY: 1-2x/week  PT DURATION: 12 weeks  PLANNED INTERVENTIONS: Therapeutic exercises, Therapeutic activity, Neuromuscular re-education, Balance training, Gait training, Patient/Family education, Self Care, Joint mobilization, and Manual therapy  PLAN FOR NEXT SESSION: continue to work on the coordination of the LE's for balance and gait, recert done  [employer], PT 01/28/2024, 6:26 PM

## 2024-02-01 ENCOUNTER — Ambulatory Visit

## 2024-02-01 DIAGNOSIS — R262 Difficulty in walking, not elsewhere classified: Secondary | ICD-10-CM

## 2024-02-01 DIAGNOSIS — M6281 Muscle weakness (generalized): Secondary | ICD-10-CM | POA: Diagnosis not present

## 2024-02-01 DIAGNOSIS — R2681 Unsteadiness on feet: Secondary | ICD-10-CM

## 2024-02-01 NOTE — Therapy (Signed)
 OUTPATIENT PHYSICAL THERAPY LOWER EXTREMITY TREATMENT     Patient Name: Pamela Snyder MRN: [id number] DOB:11/09/1945, 78 y.o., female Today's Date: 02/01/2024  END OF SESSION:                  Past Medical History:  Diagnosis Date   Anxiety    Arthritis    right foot; spine; hands (11/23/2014)   Charcot foot due to diabetes mellitus (HCC)    Depression    GERD (gastroesophageal reflux disease)    Gout    Hyperlipemia    Hypertension    Migraine    hx   Neuropathy    Numbness    Thyroid  goiter 1986   Type II diabetes mellitus (HCC)    Past Surgical History:  Procedure Laterality Date   ABDOMINAL HYSTERECTOMY  1999   w/BSO   ACHILLES TENDON LENGTHENING Right 11/23/2014   ACHILLES TENDON LENGTHENING Right 11/23/2014   Procedure: RIGHT ACHILLES PERCUTANEOUS TENDON LENGTHENING;  Surgeon: Norleen Armor, MD;  Location: [facility] OR;  Service: Orthopedics;  Laterality: Right;   APPENDECTOMY  1963   ARTHRODESIS Right 11/23/2014   mid foot   CARDIAC CATHETERIZATION  08/2004   FOOT ARTHRODESIS Right 11/23/2014   Procedure: RIGHT MID FOOT ARTHRODESIS;  Surgeon: Norleen Armor, MD;  Location: [facility] OR;  Service: Orthopedics;  Laterality: Right;   METATARSAL OSTEOTOMY Right 11/23/2014   Procedure: RIGHT MID FOOT OSTEOTOMY;  Surgeon: Norleen Armor, MD;  Location: [facility];  Service: Orthopedics;  Laterality: Right;   ORIF ANKLE FRACTURE Left 05/16/2021   Procedure: OPEN REDUCTION INTERNAL FIXATION (ORIF) Left ankle lateral malleolus, possible deltoid ligament repair;  Surgeon: Armor Norleen, MD;  Location: [facility];  Service: Orthopedics;  Laterality: Left;   OSTEOTOMY Right 11/23/2014   mid foot   PARTIAL THYMECTOMY  1986   ? side   TONSILLECTOMY  1954   Patient Active Problem List   Diagnosis Date Noted   Closed low lateral malleolus fracture 05/16/2021   Gout 05/16/2021   Hyperlipemia 05/16/2021   Hypertension 05/16/2021   Type II diabetes mellitus (HCC) 05/16/2021   Spinal  stenosis of lumbar region 03/10/2019   Cervical myelopathy (HCC) 03/10/2019   Peripheral neuropathy 02/02/2019   Gait abnormality 02/02/2019   Urinary urgency 02/02/2019   Chronic bilateral low back pain without sciatica 02/02/2019   DOE (dyspnea on exertion) 10/05/2017   Bruit of right carotid artery 10/05/2017   Coronary artery calcification seen on CT scan 10/05/2017   Chest pain 10/04/2017   Aortic atherosclerosis (HCC) 10/04/2017   Charcot foot due to diabetes mellitus (HCC) 11/23/2014    PCP: Katina Pfeiffer, PA  REFERRING PROVIDER: Katina, PA  REFERRING DIAG: poor balance, weakness, difficulty walking  THERAPY DIAG:  No diagnosis found.  Rationale for Evaluation and Treatment: Rehabilitation  ONSET DATE: 09/22/23  SUBJECTIVE:   SUBJECTIVE STATEMENT: Doing fine. Not sleeping well at night.    Had a fall in November 2022 and sustained a left Malleolar fracture with ORIF.  She was suffering from neuropathy, had just started to use a walker but prior to that did not use a device.  We saw her April 2024 and she progressed well with walking, strength and function.  She returns today due to weakness, difficulty walking, decreased endurance, and unsteady gait.  Denies any recent falls.  She reports that she is walking much slower and is a little more unsteady, feels weak, reports that she is taking Lyrica  and Cymbalta.  PERTINENT HISTORY: See above PAIN:  Are you having pain? Yes: NPRS scale: 3/10 Pain location: low back Pain description: tight, stabbing Aggravating factors: being on feet longer, twisting pain pain up to 6/10 Relieving factors: rest pain will go to 0/10  PRECAUTIONS: Fall  WEIGHT BEARING RESTRICTIONS: No  FALLS:  Has patient fallen in last 6 months? No, just unsteady  LIVING ENVIRONMENT: Lives with: lives alone Lives in: House/apartment Stairs: Yes: Internal: 12 steps; can reach both Has following equipment at home: Vannie - 2 wheeled, Nurse, mental health, and Grab bars  OCCUPATION: retired  PLOF: Independent with household mobility with device and Needs assistance with homemaking has two daughters that check on her every other day, she does some cooking, does some housework  PATIENT GOALS: may go to the beach in the fall, stay living at home independently  NEXT MD VISIT: none scheduled  OBJECTIVE:   DIAGNOSTIC FINDINGS: none  COGNITION: Overall cognitive status: Within functional limits for tasks assessed     SENSATION: Mid shin down very limited sensation light touch can feel some deeper touch  EDEMA:  Very mild at the ankles  MUSCLE LENGTH: Mild tightness in the HS  POSTURE: rounded shoulders and forward head  PALPATION: Tight and tender in the lumbar paraspinals  LOWER EXTREMITY ROM:  Active ROM Right eval Left eval  Hip flexion 80 80  Hip extension    Hip abduction    Hip adduction    Hip internal rotation    Hip external rotation    Knee flexion    Knee extension 0 0  Ankle dorsiflexion 0 0  Ankle plantarflexion    Ankle inversion    Ankle eversion     (Blank rows = not tested)  LOWER EXTREMITY MMT:  MMT Right eval Left eval RT /left  01/14/24  Hip flexion 4 4- 4+/4+  Hip extension 4- 4- 4/4  Hip abduction 4- 4- 4/4  Hip adduction     Hip internal rotation     Hip external rotation     Knee flexion 4- 4- WFLS BIL  Knee extension 4 34 WFLs BIL  Ankle dorsiflexion 1+ 0   Ankle plantarflexion 1 1   Ankle inversion 1 0   Ankle eversion 0 0    (Blank rows = not tested) FUNCTIONAL TESTS:  TUG: 37 seconds with FWW at evaluation walk test with CGA and FWW = 158 feet at evaluation, had to stop due to fatigue Unable to stand without holding onto something  GAIT: Distance walked: 158 feet Assistive device utilized: Walker - 2 wheeled Level of assistance: SBA to start and then needs CGA Comments: foot drop bilaterally, decreased foot control, some hip drop on the left side, when  fatigued poor safety with transfers   TODAY'S TREATMENT:                                                                                                                              DATE:  02/01/24 NuStep L5 x33mins  Supine stretches for LE AB isometric 3x10 Feet on pball rotations and knees to chest  Ball squeeze 2x10  Modified sit ups with ball toss  Leg ext 10# 3x10  HS curls 25# 3x10  Lat pull downs 20# 2x10  01/28/24 NuStep L5 x85mins  Lat pull down 20# 2x10 Black TB flexion 2x10 Leg ext 10# 2x10  HS curls 25# 2x10  On airex box taps with RW- SBA  Chest press 3# WaTE 2x10    01/25/24 Recheck goals  NuStep L5x69mins  Leg ext 10# 3x10 HS curls 25# 3x10 STS x5- very hard for her  Box taps forward and sideways 5# ankle weight Chest press with yellow ball 2x10  01/21/24 Nustep level 5 x 10 minutes Leg curls 25# 3x10 Leg ext 10# 3x10 Lat pulls 20# Partial sit ups Standing 2.5# marching and hip abduction Leg press 20# Green tband clamshells Ball b/n knees squeeze  01/18/24 NuStep L5x62mins Leg ext 10# 2x12 HS curls 25# 2x12 Feet on ball K2C, rotation, isometric abs Ball squeeze 2x10 Hip abduction green 2x10 Hip flexion green 2x10   01/14/23 Nustep L 5 8 min Checked goals and  MMT and ROM ( see above chart) TUG with RW  20.21 sec Black tband trunk flexion 20 x Lat pull 25# 2 sets 10 Seated EOM wt ball toss for core Seated EOM 3# bar ball hitting LAQ 3# 2 sets 10 Standing with RW 3# ankle wts marching,sl hip flex,ext and abd 10x each CGA and guarding on knees to prevent buckling. ABD  challenging Ball toss standing min -mod A with multi LOB back onto mat 12 x   01/04/24 Nustep level 5 x 8 minutes Partial sit ups Feet on ball K2C, rotation, bridges, isometric abs Ball b/n knees bridge LAts 25#  Black tband trunk flexion Leg press 20# both, then single legs 2x5 each Leg curls 25#  12/31/23 NuStep L5x58mins  Stepping over obstacles in bars forwards and  side steps  Balance on airex in bars 2 finger support  Step ups on airex in bars x5 each side  Lat pull downs 20# 3x10 Chest press 10# 3x10  12/28/23 Level 5 x 10 minutes Side stepping Marching, hip abduction and extension in Pbars 35# leg curls Leg extension 10# Feet on ball K2C, rotation, bridges, isometric abs Green tband clamshells Ball b/n knees squeeze Green tband supine hip extension   12/24/23 NuStep L5x58mins  Single arm lat pull standing 10#- one hand on walker  Leg ext 10# 2x12 HS curls 25# 2x12 Standing on airex marching holding on the walker Bicep curls 3# 2x10  Alternating punches with 2# 2x10   12/21/23 Nustep level 5 x 6 minutes 10# chest press 20# lats Black tband back flexion 20# leg press Partial sit ups 5# marches 5# hip abduction 5# single arm row and extension holding walker Green tband clamshells 3# LAQ Seated 3# marches Sidestepping 4# biceps  12/17/23 NuStep L5x71mins  Modified sit ups yellow ball 2x10 Standing marches 5#  Lateral step taps 4 with 5#  Volleyball  Lat pull down 25# 2x10 blackTB flexion 2x10  12/14/23 NuStep L5x28mins Box taps 4 with RW  STS w/RW, 2x10 some use of table against legs  OHP 3# 2x10 RUE, 2x5 LUE  HS curls 25# 2x10 Leg ext 10# 2x10 Lat pull down 20# 2x10    11/26/23 NuStep L5x52mins  Walk outside out front door to sign and back in Pball flexion x10 Standing with legs  against table rows and ext 2x10 Lat pull down 20# 2x10  blackTB flexion 2x10  11/23/23 3 min walk test- able to do 390ft in 2 min 40s  NuStep L5x34mins Leg ext 10# 2x10 HS curls 25# 2x10 Side step in bars  Mini squats in bars x10   11/19/23 NuStep L5x33mins  TUG- 32s  Lat pull down 20# 2x10  Feet on pball rotations, knees to chest x10 Bridges x10 Passive LE stretching- HS, glutes, piriformis Isometric abs x10 3s holds  AB roll small crunches x10 SLR x10, SLR 1.5# x10   11/16/23 Nustep level 5 x 7 minutes LEg curls 20#  2x10 Leg extension 10# 2x12 Lat pulls 20# 2x12 Chest press 10# 2x12 Leg press 20# Seated voilley ball Seated green tband clamshells  PATIENT EDUCATION:  Education details: POC Person educated: Patient Education method: Explanation Education comprehension: verbalized understanding  HOME EXERCISE PROGRAM: TBD  ASSESSMENT:  CLINICAL IMPRESSION: Patient reports no back pain today so we did some supine activities and for the core. Does well with interventions today, no c/o pain. She really does well with the exercises for strength but due to the neuropathy really cannot do much balance activties. Pt will continue to benefit from skilled interventions to address balance and strength as well as gait progression to keep  her as independent as possible as she lives alone.  OBJECTIVE IMPAIRMENTS: Abnormal gait, cardiopulmonary status limiting activity, decreased activity tolerance, decreased balance, decreased coordination, decreased endurance, decreased mobility, difficulty walking, decreased ROM, decreased strength, impaired flexibility, impaired sensation, and pain.   REHAB POTENTIAL: Good  CLINICAL DECISION MAKING: Stable/uncomplicated  EVALUATION COMPLEXITY: Low   GOALS: Goals reviewed with patient? Yes  SHORT TERM GOALS: Target date: 11/11/23 Independent with initial HEP Goal status: progressing 11/16/23, not doing much at home 12/14/23 Progressing/ doing some 01/14/24  LONG TERM GOALS: Target date: 02/19/24  Independent with advanced HEP or gym program Goal status: ongoing 01/04/24  and 01/14/24  2.  Decrease TUG time to 24 seconds Goal status: REVISED 32s 11/19/23, 24s MET 12/14/23 Will decrease TUG to < 20s 01/21/24  Goal status: Ongoing 22s 01/25/23  3.  Walk 300 feet in 3 minute walk test Goal status: MET 11/23/23  4.  Be able to negotiate stairs and curbs with SBA with one at a time and a handrail Goal status: IN PROGRESS modA still needed 01/04/24  01/14/24, 01/21/24, 2 rails,  step to 01/25/24  5.  Report able to warm up a meal without sitting Goal status: MET 12/14/23   6. Patient will be able to lift LLE without help to get in and out of exercise machines and the car   Baseline: has to pick up leg to move  Goal status: INITIAL     PLAN:  PT FREQUENCY: 1-2x/week  PT DURATION: 12 weeks  PLANNED INTERVENTIONS: Therapeutic exercises, Therapeutic activity, Neuromuscular re-education, Balance training, Gait training, Patient/Family education, Self Care, Joint mobilization, and Manual therapy  PLAN FOR NEXT SESSION: continue to work on the coordination of the LE's for balance and gait, recert done  [employer], PT 02/01/2024, 2:09 PM

## 2024-02-03 NOTE — Therapy (Signed)
 OUTPATIENT PHYSICAL THERAPY LOWER EXTREMITY TREATMENT     Patient Name: Pamela Snyder MRN: [id number] DOB:Aug 31, 1945, 78 y.o., female Today's Date: 02/04/2024  END OF SESSION:  PT End of Session - 02/04/24 1713     Visit Number 23    Date for PT Re-Evaluation 02/19/24    Authorization Type Medicare    PT Start Time 1713    PT Stop Time 1758    PT Time Calculation (min) 45 min    Activity Tolerance Patient tolerated treatment well    Behavior During Therapy [facility] for tasks assessed/performed                         Past Medical History:  Diagnosis Date   Anxiety    Arthritis    right foot; spine; hands (11/23/2014)   Charcot foot due to diabetes mellitus (HCC)    Depression    GERD (gastroesophageal reflux disease)    Gout    Hyperlipemia    Hypertension    Migraine    hx   Neuropathy    Numbness    Thyroid  goiter 1986   Type II diabetes mellitus (HCC)    Past Surgical History:  Procedure Laterality Date   ABDOMINAL HYSTERECTOMY  1999   w/BSO   ACHILLES TENDON LENGTHENING Right 11/23/2014   ACHILLES TENDON LENGTHENING Right 11/23/2014   Procedure: RIGHT ACHILLES PERCUTANEOUS TENDON LENGTHENING;  Surgeon: Norleen Armor, MD;  Location: [facility] OR;  Service: Orthopedics;  Laterality: Right;   APPENDECTOMY  1963   ARTHRODESIS Right 11/23/2014   mid foot   CARDIAC CATHETERIZATION  08/2004   FOOT ARTHRODESIS Right 11/23/2014   Procedure: RIGHT MID FOOT ARTHRODESIS;  Surgeon: Norleen Armor, MD;  Location: [facility] OR;  Service: Orthopedics;  Laterality: Right;   METATARSAL OSTEOTOMY Right 11/23/2014   Procedure: RIGHT MID FOOT OSTEOTOMY;  Surgeon: Norleen Armor, MD;  Location: [facility] OR;  Service: Orthopedics;  Laterality: Right;   ORIF ANKLE FRACTURE Left 05/16/2021   Procedure: OPEN REDUCTION INTERNAL FIXATION (ORIF) Left ankle lateral malleolus, possible deltoid ligament repair;  Surgeon: Armor Norleen, MD;  Location: [facility] OR;  Service: Orthopedics;  Laterality: Left;    OSTEOTOMY Right 11/23/2014   mid foot   PARTIAL THYMECTOMY  1986   ? side   TONSILLECTOMY  1954   Patient Active Problem List   Diagnosis Date Noted   Closed low lateral malleolus fracture 05/16/2021   Gout 05/16/2021   Hyperlipemia 05/16/2021   Hypertension 05/16/2021   Type II diabetes mellitus (HCC) 05/16/2021   Spinal stenosis of lumbar region 03/10/2019   Cervical myelopathy (HCC) 03/10/2019   Peripheral neuropathy 02/02/2019   Gait abnormality 02/02/2019   Urinary urgency 02/02/2019   Chronic bilateral low back pain without sciatica 02/02/2019   DOE (dyspnea on exertion) 10/05/2017   Bruit of right carotid artery 10/05/2017   Coronary artery calcification seen on CT scan 10/05/2017   Chest pain 10/04/2017   Aortic atherosclerosis (HCC) 10/04/2017   Charcot foot due to diabetes mellitus (HCC) 11/23/2014    PCP: Katina Pfeiffer, PA  REFERRING PROVIDER: Katina, PA  REFERRING DIAG: poor balance, weakness, difficulty walking  THERAPY DIAG:  Muscle weakness (generalized)  Unsteady gait  Difficulty in walking, not elsewhere classified  Rationale for Evaluation and Treatment: Rehabilitation  ONSET DATE: 09/22/23  SUBJECTIVE:   SUBJECTIVE STATEMENT: I am doing.    Had a fall in November 2022 and sustained a left Malleolar fracture with ORIF.  She was  suffering from neuropathy, had just started to use a walker but prior to that did not use a device.  We saw her April 2024 and she progressed well with walking, strength and function.  She returns today due to weakness, difficulty walking, decreased endurance, and unsteady gait.  Denies any recent falls.  She reports that she is walking much slower and is a little more unsteady, feels weak, reports that she is taking Lyrica  and Cymbalta.  PERTINENT HISTORY: See above PAIN:  Are you having pain? Yes: NPRS scale: 3/10 Pain location: low back Pain description: tight, stabbing Aggravating factors: being on feet longer,  twisting pain pain up to 6/10 Relieving factors: rest pain will go to 0/10  PRECAUTIONS: Fall  WEIGHT BEARING RESTRICTIONS: No  FALLS:  Has patient fallen in last 6 months? No, just unsteady  LIVING ENVIRONMENT: Lives with: lives alone Lives in: House/apartment Stairs: Yes: Internal: 12 steps; can reach both Has following equipment at home: Vannie - 2 wheeled, Tour manager, and Grab bars  OCCUPATION: retired  PLOF: Independent with household mobility with device and Needs assistance with homemaking has two daughters that check on her every other day, she does some cooking, does some housework  PATIENT GOALS: may go to the beach in the fall, stay living at home independently  NEXT MD VISIT: none scheduled  OBJECTIVE:   DIAGNOSTIC FINDINGS: none  COGNITION: Overall cognitive status: Within functional limits for tasks assessed     SENSATION: Mid shin down very limited sensation light touch can feel some deeper touch  EDEMA:  Very mild at the ankles  MUSCLE LENGTH: Mild tightness in the HS  POSTURE: rounded shoulders and forward head  PALPATION: Tight and tender in the lumbar paraspinals  LOWER EXTREMITY ROM:  Active ROM Right eval Left eval  Hip flexion 80 80  Hip extension    Hip abduction    Hip adduction    Hip internal rotation    Hip external rotation    Knee flexion    Knee extension 0 0  Ankle dorsiflexion 0 0  Ankle plantarflexion    Ankle inversion    Ankle eversion     (Blank rows = not tested)  LOWER EXTREMITY MMT:  MMT Right eval Left eval RT /left  01/14/24  Hip flexion 4 4- 4+/4+  Hip extension 4- 4- 4/4  Hip abduction 4- 4- 4/4  Hip adduction     Hip internal rotation     Hip external rotation     Knee flexion 4- 4- WFLS BIL  Knee extension 4 34 WFLs BIL  Ankle dorsiflexion 1+ 0   Ankle plantarflexion 1 1   Ankle inversion 1 0   Ankle eversion 0 0    (Blank rows = not tested) FUNCTIONAL TESTS:  TUG: 37 seconds with FWW at  evaluation walk test with CGA and FWW = 158 feet at evaluation, had to stop due to fatigue Unable to stand without holding onto something  GAIT: Distance walked: 158 feet Assistive device utilized: Walker - 2 wheeled Level of assistance: SBA to start and then needs CGA Comments: foot drop bilaterally, decreased foot control, some hip drop on the left side, when fatigued poor safety with transfers   TODAY'S TREATMENT:  DATE:   02/04/24 NuStep L5x66mins  Box taps forward and lateral 3#  On airex marching 3#  Leg ext 10# 3x10 HS curls 25# 3x10 2# punching  Standing hitting ball Ball bouncing sitting   02/01/24 NuStep L5 x16mins  Supine stretches for LE AB isometric 3x10 Feet on pball rotations and knees to chest  Ball squeeze 2x10  Modified sit ups with ball toss  Leg ext 10# 3x10  HS curls 25# 3x10  Lat pull downs 20# 2x10  01/28/24 NuStep L5 x87mins  Lat pull down 20# 2x10 Black TB flexion 2x10 Leg ext 10# 2x10  HS curls 25# 2x10  On airex box taps with RW- SBA  Chest press 3# WaTE 2x10    01/25/24 Recheck goals  NuStep L5x81mins  Leg ext 10# 3x10 HS curls 25# 3x10 STS x5- very hard for her  Box taps forward and sideways 5# ankle weight Chest press with yellow ball 2x10  01/21/24 Nustep level 5 x 10 minutes Leg curls 25# 3x10 Leg ext 10# 3x10 Lat pulls 20# Partial sit ups Standing 2.5# marching and hip abduction Leg press 20# Green tband clamshells Ball b/n knees squeeze  01/18/24 NuStep L5x22mins Leg ext 10# 2x12 HS curls 25# 2x12 Feet on ball K2C, rotation, isometric abs Ball squeeze 2x10 Hip abduction green 2x10 Hip flexion green 2x10   01/14/23 Nustep L 5 8 min Checked goals and  MMT and ROM ( see above chart) TUG with RW  20.21 sec Black tband trunk flexion 20 x Lat pull 25# 2 sets 10 Seated EOM wt ball toss for  core Seated EOM 3# bar ball hitting LAQ 3# 2 sets 10 Standing with RW 3# ankle wts marching,sl hip flex,ext and abd 10x each CGA and guarding on knees to prevent buckling. ABD  challenging Ball toss standing min -mod A with multi LOB back onto mat 12 x   01/04/24 Nustep level 5 x 8 minutes Partial sit ups Feet on ball K2C, rotation, bridges, isometric abs Ball b/n knees bridge LAts 25#  Black tband trunk flexion Leg press 20# both, then single legs 2x5 each Leg curls 25#  12/31/23 NuStep L5x42mins  Stepping over obstacles in bars forwards and side steps  Balance on airex in bars 2 finger support  Step ups on airex in bars x5 each side  Lat pull downs 20# 3x10 Chest press 10# 3x10  12/28/23 Level 5 x 10 minutes Side stepping Marching, hip abduction and extension in Pbars 35# leg curls Leg extension 10# Feet on ball K2C, rotation, bridges, isometric abs Green tband clamshells Ball b/n knees squeeze Green tband supine hip extension   12/24/23 NuStep L5x27mins  Single arm lat pull standing 10#- one hand on walker  Leg ext 10# 2x12 HS curls 25# 2x12 Standing on airex marching holding on the walker Bicep curls 3# 2x10  Alternating punches with 2# 2x10   12/21/23 Nustep level 5 x 6 minutes 10# chest press 20# lats Black tband back flexion 20# leg press Partial sit ups 5# marches 5# hip abduction 5# single arm row and extension holding walker Green tband clamshells 3# LAQ Seated 3# marches Sidestepping 4# biceps  12/17/23 NuStep L5x43mins  Modified sit ups yellow ball 2x10 Standing marches 5#  Lateral step taps 4 with 5#  Volleyball  Lat pull down 25# 2x10 blackTB flexion 2x10  12/14/23 NuStep L5x33mins Box taps 4 with RW  STS w/RW, 2x10 some use of table against legs  OHP 3# 2x10 RUE, 2x5  LUE  HS curls 25# 2x10 Leg ext 10# 2x10 Lat pull down 20# 2x10    11/26/23 NuStep L5x23mins  Walk outside out front door to sign and back in Pball flexion  x10 Standing with legs against table rows and ext 2x10 Lat pull down 20# 2x10  blackTB flexion 2x10  11/23/23 3 min walk test- able to do 359ft in 2 min 40s  NuStep L5x28mins Leg ext 10# 2x10 HS curls 25# 2x10 Side step in bars  Mini squats in bars x10   11/19/23 NuStep L5x42mins  TUG- 32s  Lat pull down 20# 2x10  Feet on pball rotations, knees to chest x10 Bridges x10 Passive LE stretching- HS, glutes, piriformis Isometric abs x10 3s holds  AB roll small crunches x10 SLR x10, SLR 1.5# x10   11/16/23 Nustep level 5 x 7 minutes LEg curls 20# 2x10 Leg extension 10# 2x12 Lat pulls 20# 2x12 Chest press 10# 2x12 Leg press 20# Seated voilley ball Seated green tband clamshells  PATIENT EDUCATION:  Education details: POC Person educated: Patient Education method: Explanation Education comprehension: verbalized understanding  HOME EXERCISE PROGRAM: TBD  ASSESSMENT:  CLINICAL IMPRESSION: Patient reports no back pain today so we did some supine activities and for the core. Does well with interventions today, no c/o pain. She really does well with the exercises for strength but due to the neuropathy really cannot do much balance activties. Pt will continue to benefit from skilled interventions to address balance and strength as well as gait progression to keep  her as independent as possible as she lives alone.  OBJECTIVE IMPAIRMENTS: Abnormal gait, cardiopulmonary status limiting activity, decreased activity tolerance, decreased balance, decreased coordination, decreased endurance, decreased mobility, difficulty walking, decreased ROM, decreased strength, impaired flexibility, impaired sensation, and pain.   REHAB POTENTIAL: Good  CLINICAL DECISION MAKING: Stable/uncomplicated  EVALUATION COMPLEXITY: Low   GOALS: Goals reviewed with patient? Yes  SHORT TERM GOALS: Target date: 11/11/23 Independent with initial HEP Goal status: progressing 11/16/23, not doing much at home  12/14/23 Progressing/ doing some 01/14/24  LONG TERM GOALS: Target date: 02/19/24  Independent with advanced HEP or gym program Goal status: ongoing 01/04/24  and 01/14/24  2.  Decrease TUG time to 24 seconds Goal status: REVISED 32s 11/19/23, 24s MET 12/14/23 Will decrease TUG to < 20s 01/21/24  Goal status: Ongoing 22s 01/25/23  3.  Walk 300 feet in 3 minute walk test Goal status: MET 11/23/23  4.  Be able to negotiate stairs and curbs with SBA with one at a time and a handrail Goal status: IN PROGRESS modA still needed 01/04/24  01/14/24, 01/21/24, 2 rails, step to 01/25/24  5.  Report able to warm up a meal without sitting Goal status: MET 12/14/23   6. Patient will be able to lift LLE without help to get in and out of exercise machines and the car   Baseline: has to pick up leg to move  Goal status: INITIAL     PLAN:  PT FREQUENCY: 1-2x/week  PT DURATION: 12 weeks  PLANNED INTERVENTIONS: Therapeutic exercises, Therapeutic activity, Neuromuscular re-education, Balance training, Gait training, Patient/Family education, Self Care, Joint mobilization, and Manual therapy  PLAN FOR NEXT SESSION: continue to work on the coordination of the LE's for balance and gait, recert done  [employer], PT 02/04/2024, 6:02 PM

## 2024-02-04 ENCOUNTER — Ambulatory Visit

## 2024-02-04 DIAGNOSIS — R262 Difficulty in walking, not elsewhere classified: Secondary | ICD-10-CM | POA: Diagnosis not present

## 2024-02-04 DIAGNOSIS — I2510 Atherosclerotic heart disease of native coronary artery without angina pectoris: Secondary | ICD-10-CM | POA: Diagnosis not present

## 2024-02-04 DIAGNOSIS — E7800 Pure hypercholesterolemia, unspecified: Secondary | ICD-10-CM | POA: Diagnosis not present

## 2024-02-04 DIAGNOSIS — R2681 Unsteadiness on feet: Secondary | ICD-10-CM

## 2024-02-04 DIAGNOSIS — M810 Age-related osteoporosis without current pathological fracture: Secondary | ICD-10-CM | POA: Diagnosis not present

## 2024-02-04 DIAGNOSIS — M6281 Muscle weakness (generalized): Secondary | ICD-10-CM

## 2024-02-04 DIAGNOSIS — E1143 Type 2 diabetes mellitus with diabetic autonomic (poly)neuropathy: Secondary | ICD-10-CM | POA: Diagnosis not present

## 2024-02-04 DIAGNOSIS — N183 Chronic kidney disease, stage 3 unspecified: Secondary | ICD-10-CM | POA: Diagnosis not present

## 2024-02-04 DIAGNOSIS — I10 Essential (primary) hypertension: Secondary | ICD-10-CM | POA: Diagnosis not present

## 2024-02-08 ENCOUNTER — Encounter: Payer: Self-pay | Admitting: Physical Therapy

## 2024-02-08 ENCOUNTER — Ambulatory Visit: Attending: Neurology | Admitting: Physical Therapy

## 2024-02-08 DIAGNOSIS — R2681 Unsteadiness on feet: Secondary | ICD-10-CM | POA: Diagnosis not present

## 2024-02-08 DIAGNOSIS — R262 Difficulty in walking, not elsewhere classified: Secondary | ICD-10-CM | POA: Insufficient documentation

## 2024-02-08 DIAGNOSIS — M6281 Muscle weakness (generalized): Secondary | ICD-10-CM | POA: Insufficient documentation

## 2024-02-08 NOTE — Therapy (Signed)
 OUTPATIENT PHYSICAL THERAPY LOWER EXTREMITY TREATMENT     Patient Name: Pamela Snyder MRN: [id number] DOB:04-25-1946, 78 y.o., female Today's Date: 02/08/2024  END OF SESSION:  PT End of Session - 02/08/24 1747     Visit Number 24    Date for PT Re-Evaluation 02/19/24    Authorization Type Medicare    PT Start Time 1745    PT Stop Time 1830    PT Time Calculation (min) 45 min    Activity Tolerance Patient tolerated treatment well    Behavior During Therapy [facility] for tasks assessed/performed                         Past Medical History:  Diagnosis Date   Anxiety    Arthritis    right foot; spine; hands (11/23/2014)   Charcot foot due to diabetes mellitus (HCC)    Depression    GERD (gastroesophageal reflux disease)    Gout    Hyperlipemia    Hypertension    Migraine    hx   Neuropathy    Numbness    Thyroid  goiter 1986   Type II diabetes mellitus (HCC)    Past Surgical History:  Procedure Laterality Date   ABDOMINAL HYSTERECTOMY  1999   w/BSO   ACHILLES TENDON LENGTHENING Right 11/23/2014   ACHILLES TENDON LENGTHENING Right 11/23/2014   Procedure: RIGHT ACHILLES PERCUTANEOUS TENDON LENGTHENING;  Surgeon: Norleen Armor, MD;  Location: [facility] OR;  Service: Orthopedics;  Laterality: Right;   APPENDECTOMY  1963   ARTHRODESIS Right 11/23/2014   mid foot   CARDIAC CATHETERIZATION  08/2004   FOOT ARTHRODESIS Right 11/23/2014   Procedure: RIGHT MID FOOT ARTHRODESIS;  Surgeon: Norleen Armor, MD;  Location: [facility] OR;  Service: Orthopedics;  Laterality: Right;   METATARSAL OSTEOTOMY Right 11/23/2014   Procedure: RIGHT MID FOOT OSTEOTOMY;  Surgeon: Norleen Armor, MD;  Location: [facility] OR;  Service: Orthopedics;  Laterality: Right;   ORIF ANKLE FRACTURE Left 05/16/2021   Procedure: OPEN REDUCTION INTERNAL FIXATION (ORIF) Left ankle lateral malleolus, possible deltoid ligament repair;  Surgeon: Armor Norleen, MD;  Location: [facility] OR;  Service: Orthopedics;  Laterality: Left;    OSTEOTOMY Right 11/23/2014   mid foot   PARTIAL THYMECTOMY  1986   ? side   TONSILLECTOMY  1954   Patient Active Problem List   Diagnosis Date Noted   Closed low lateral malleolus fracture 05/16/2021   Gout 05/16/2021   Hyperlipemia 05/16/2021   Hypertension 05/16/2021   Type II diabetes mellitus (HCC) 05/16/2021   Spinal stenosis of lumbar region 03/10/2019   Cervical myelopathy (HCC) 03/10/2019   Peripheral neuropathy 02/02/2019   Gait abnormality 02/02/2019   Urinary urgency 02/02/2019   Chronic bilateral low back pain without sciatica 02/02/2019   DOE (dyspnea on exertion) 10/05/2017   Bruit of right carotid artery 10/05/2017   Coronary artery calcification seen on CT scan 10/05/2017   Chest pain 10/04/2017   Aortic atherosclerosis (HCC) 10/04/2017   Charcot foot due to diabetes mellitus (HCC) 11/23/2014    PCP: Katina Pfeiffer, PA  REFERRING PROVIDER: Katina, PA  REFERRING DIAG: poor balance, weakness, difficulty walking  THERAPY DIAG:  Muscle weakness (generalized)  Unsteady gait  Difficulty in walking, not elsewhere classified  Rationale for Evaluation and Treatment: Rehabilitation  ONSET DATE: 09/22/23  SUBJECTIVE:   SUBJECTIVE STATEMENT: Patient reports that she is not doing well today, reports that she was brushing teeth and felt like her legs went out, reports  has to hold onto sink and feels like the legs just won't work the mms are gone.  Reports that she feels like this has happened about 5 x in the past month   Had a fall in November 2022 and sustained a left Malleolar fracture with ORIF.  She was suffering from neuropathy, had just started to use a walker but prior to that did not use a device.  We saw her April 2024 and she progressed well with walking, strength and function.  She returns today due to weakness, difficulty walking, decreased endurance, and unsteady gait.  Denies any recent falls.  She reports that she is walking much slower and is a  little more unsteady, feels weak, reports that she is taking Lyrica  and Cymbalta.  PERTINENT HISTORY: See above PAIN:  Are you having pain? Yes: NPRS scale: 3/10 Pain location: low back Pain description: tight, stabbing Aggravating factors: being on feet longer, twisting pain pain up to 6/10 Relieving factors: rest pain will go to 0/10  PRECAUTIONS: Fall  WEIGHT BEARING RESTRICTIONS: No  FALLS:  Has patient fallen in last 6 months? No, just unsteady  LIVING ENVIRONMENT: Lives with: lives alone Lives in: House/apartment Stairs: Yes: Internal: 12 steps; can reach both Has following equipment at home: Vannie - 2 wheeled, Tour manager, and Grab bars  OCCUPATION: retired  PLOF: Independent with household mobility with device and Needs assistance with homemaking has two daughters that check on her every other day, she does some cooking, does some housework  PATIENT GOALS: may go to the beach in the fall, stay living at home independently  NEXT MD VISIT: none scheduled  OBJECTIVE:   DIAGNOSTIC FINDINGS: none  COGNITION: Overall cognitive status: Within functional limits for tasks assessed     SENSATION: Mid shin down very limited sensation light touch can feel some deeper touch  EDEMA:  Very mild at the ankles  MUSCLE LENGTH: Mild tightness in the HS  POSTURE: rounded shoulders and forward head  PALPATION: Tight and tender in the lumbar paraspinals  LOWER EXTREMITY ROM:  Active ROM Right eval Left eval  Hip flexion 80 80  Hip extension    Hip abduction    Hip adduction    Hip internal rotation    Hip external rotation    Knee flexion    Knee extension 0 0  Ankle dorsiflexion 0 0  Ankle plantarflexion    Ankle inversion    Ankle eversion     (Blank rows = not tested)  LOWER EXTREMITY MMT:  MMT Right eval Left eval RT /left  01/14/24  Hip flexion 4 4- 4+/4+  Hip extension 4- 4- 4/4  Hip abduction 4- 4- 4/4  Hip adduction     Hip internal  rotation     Hip external rotation     Knee flexion 4- 4- WFLS BIL  Knee extension 4 34 WFLs BIL  Ankle dorsiflexion 1+ 0   Ankle plantarflexion 1 1   Ankle inversion 1 0   Ankle eversion 0 0    (Blank rows = not tested) FUNCTIONAL TESTS:  TUG: 37 seconds with FWW at evaluation walk test with CGA and FWW = 158 feet at evaluation, had to stop due to fatigue Unable to stand without holding onto something  GAIT: Distance walked: 158 feet Assistive device utilized: Walker - 2 wheeled Level of assistance: SBA to start and then needs CGA Comments: foot drop bilaterally, decreased foot control, some hip drop on the left side,  when fatigued poor safety with transfers   TODAY'S TREATMENT:                                                                                                                              DATE:   02/08/24 Nustep level 5 x 8 minutes Sheet traction  STM to the lumbar area and into the buttocks Seated 3# marches 3# LAQ Ball b/n knees squeeze Red tband clamshells Red tband HS curls  02/04/24 NuStep L5x24mins  Box taps forward and lateral 3#  On airex marching 3#  Leg ext 10# 3x10 HS curls 25# 3x10 2# punching  Standing hitting ball Ball bouncing sitting   02/01/24 NuStep L5 x36mins  Supine stretches for LE AB isometric 3x10 Feet on pball rotations and knees to chest  Ball squeeze 2x10  Modified sit ups with ball toss  Leg ext 10# 3x10  HS curls 25# 3x10  Lat pull downs 20# 2x10  01/28/24 NuStep L5 x70mins  Lat pull down 20# 2x10 Black TB flexion 2x10 Leg ext 10# 2x10  HS curls 25# 2x10  On airex box taps with RW- SBA  Chest press 3# WaTE 2x10    01/25/24 Recheck goals  NuStep L5x94mins  Leg ext 10# 3x10 HS curls 25# 3x10 STS x5- very hard for her  Box taps forward and sideways 5# ankle weight Chest press with yellow ball 2x10  01/21/24 Nustep level 5 x 10 minutes Leg curls 25# 3x10 Leg ext 10# 3x10 Lat pulls 20# Partial sit  ups Standing 2.5# marching and hip abduction Leg press 20# Green tband clamshells Ball b/n knees squeeze  01/18/24 NuStep L5x16mins Leg ext 10# 2x12 HS curls 25# 2x12 Feet on ball K2C, rotation, isometric abs Ball squeeze 2x10 Hip abduction green 2x10 Hip flexion green 2x10   01/14/23 Nustep L 5 8 min Checked goals and  MMT and ROM ( see above chart) TUG with RW  20.21 sec Black tband trunk flexion 20 x Lat pull 25# 2 sets 10 Seated EOM wt ball toss for core Seated EOM 3# bar ball hitting LAQ 3# 2 sets 10 Standing with RW 3# ankle wts marching,sl hip flex,ext and abd 10x each CGA and guarding on knees to prevent buckling. ABD  challenging Ball toss standing min -mod A with multi LOB back onto mat 12 x   01/04/24 Nustep level 5 x 8 minutes Partial sit ups Feet on ball K2C, rotation, bridges, isometric abs Ball b/n knees bridge LAts 25#  Black tband trunk flexion Leg press 20# both, then single legs 2x5 each Leg curls 25#  12/31/23 NuStep L5x25mins  Stepping over obstacles in bars forwards and side steps  Balance on airex in bars 2 finger support  Step ups on airex in bars x5 each side  Lat pull downs 20# 3x10 Chest press 10# 3x10  12/28/23 Level 5 x 10 minutes Side stepping Marching, hip abduction and extension in Pbars 35# leg curls  Leg extension 10# Feet on ball K2C, rotation, bridges, isometric abs Green tband clamshells Ball b/n knees squeeze Green tband supine hip extension   12/24/23 NuStep L5x74mins  Single arm lat pull standing 10#- one hand on walker  Leg ext 10# 2x12 HS curls 25# 2x12 Standing on airex marching holding on the walker Bicep curls 3# 2x10  Alternating punches with 2# 2x10   12/21/23 Nustep level 5 x 6 minutes 10# chest press 20# lats Black tband back flexion 20# leg press Partial sit ups 5# marches 5# hip abduction 5# single arm row and extension holding walker Green tband clamshells 3# LAQ Seated 3#  marches Sidestepping 4# biceps  12/17/23 NuStep L5x68mins  Modified sit ups yellow ball 2x10 Standing marches 5#  Lateral step taps 4 with 5#  Volleyball  Lat pull down 25# 2x10 blackTB flexion 2x10  12/14/23 NuStep L5x54mins Box taps 4 with RW  STS w/RW, 2x10 some use of table against legs  OHP 3# 2x10 RUE, 2x5 LUE  HS curls 25# 2x10 Leg ext 10# 2x10 Lat pull down 20# 2x10    PATIENT EDUCATION:  Education details: POC Person educated: Patient Education method: Explanation Education comprehension: verbalized understanding  HOME EXERCISE PROGRAM: TBD  ASSESSMENT:  CLINICAL IMPRESSION:Patient comes in struggling more to walk, hyperextends the left knee for stability, really putting a lot of weight on her arms, she reports that when getting ready she felt some back pain and then her legs feel like they were not working.  I went back and reviewed an old MRI, felt like I could try some sheet traction to see if this would help as well as some STM due to tender and tightness.  Pt will continue to benefit from skilled interventions to address balance and strength as well as gait progression to keep  her as independent as possible as she lives alone.  OBJECTIVE IMPAIRMENTS: Abnormal gait, cardiopulmonary status limiting activity, decreased activity tolerance, decreased balance, decreased coordination, decreased endurance, decreased mobility, difficulty walking, decreased ROM, decreased strength, impaired flexibility, impaired sensation, and pain.   REHAB POTENTIAL: Good  CLINICAL DECISION MAKING: Stable/uncomplicated  EVALUATION COMPLEXITY: Low   GOALS: Goals reviewed with patient? Yes  SHORT TERM GOALS: Target date: 11/11/23 Independent with initial HEP Goal status: progressing 11/16/23, not doing much at home 12/14/23 Progressing/ doing some 01/14/24  LONG TERM GOALS: Target date: 02/19/24  Independent with advanced HEP or gym program Goal status: ongoing 01/04/24  and  01/14/24  2.  Decrease TUG time to 24 seconds Goal status: REVISED 32s 11/19/23, 24s MET 12/14/23 Will decrease TUG to < 20s 01/21/24  Goal status: Ongoing 22s 01/25/23  3.  Walk 300 feet in 3 minute walk test Goal status: MET 11/23/23  4.  Be able to negotiate stairs and curbs with SBA with one at a time and a handrail Goal status: IN PROGRESS modA still needed 01/04/24  01/14/24, 01/21/24, 2 rails, step to 01/25/24  5.  Report able to warm up a meal without sitting Goal status: MET 12/14/23   6. Patient will be able to lift LLE without help to get in and out of exercise machines and the car   Baseline: has to pick up leg to move  Goal status: ongoing 02/08/24     PLAN:  PT FREQUENCY: 1-2x/week  PT DURATION: 12 weeks  PLANNED INTERVENTIONS: Therapeutic exercises, Therapeutic activity, Neuromuscular re-education, Balance training, Gait training, Patient/Family education, Self Care, Joint mobilization, and Manual therapy  PLAN FOR NEXT SESSION:  see how she is doing advance if possible  OBADIAH OZELL ORN, PT 02/08/2024, 5:48 PM

## 2024-02-11 ENCOUNTER — Ambulatory Visit

## 2024-02-11 DIAGNOSIS — R2681 Unsteadiness on feet: Secondary | ICD-10-CM

## 2024-02-11 DIAGNOSIS — R262 Difficulty in walking, not elsewhere classified: Secondary | ICD-10-CM | POA: Diagnosis not present

## 2024-02-11 DIAGNOSIS — M6281 Muscle weakness (generalized): Secondary | ICD-10-CM

## 2024-02-11 NOTE — Therapy (Signed)
 OUTPATIENT PHYSICAL THERAPY LOWER EXTREMITY TREATMENT     Patient Name: Pamela Snyder MRN: [id number] DOB:01-13-1946, 78 y.o., female Today's Date: 02/11/2024  END OF SESSION:  PT End of Session - 02/11/24 1711     Visit Number 25    Date for PT Re-Evaluation 02/19/24    Authorization Type Medicare    PT Start Time 1711    PT Stop Time 1755    PT Time Calculation (min) 44 min    Activity Tolerance Patient tolerated treatment well    Behavior During Therapy [facility] for tasks assessed/performed                          Past Medical History:  Diagnosis Date   Anxiety    Arthritis    right foot; spine; hands (11/23/2014)   Charcot foot due to diabetes mellitus (HCC)    Depression    GERD (gastroesophageal reflux disease)    Gout    Hyperlipemia    Hypertension    Migraine    hx   Neuropathy    Numbness    Thyroid  goiter 1986   Type II diabetes mellitus (HCC)    Past Surgical History:  Procedure Laterality Date   ABDOMINAL HYSTERECTOMY  1999   w/BSO   ACHILLES TENDON LENGTHENING Right 11/23/2014   ACHILLES TENDON LENGTHENING Right 11/23/2014   Procedure: RIGHT ACHILLES PERCUTANEOUS TENDON LENGTHENING;  Surgeon: Norleen Armor, MD;  Location: [facility] OR;  Service: Orthopedics;  Laterality: Right;   APPENDECTOMY  1963   ARTHRODESIS Right 11/23/2014   mid foot   CARDIAC CATHETERIZATION  08/2004   FOOT ARTHRODESIS Right 11/23/2014   Procedure: RIGHT MID FOOT ARTHRODESIS;  Surgeon: Norleen Armor, MD;  Location: [facility] OR;  Service: Orthopedics;  Laterality: Right;   METATARSAL OSTEOTOMY Right 11/23/2014   Procedure: RIGHT MID FOOT OSTEOTOMY;  Surgeon: Norleen Armor, MD;  Location: [facility] OR;  Service: Orthopedics;  Laterality: Right;   ORIF ANKLE FRACTURE Left 05/16/2021   Procedure: OPEN REDUCTION INTERNAL FIXATION (ORIF) Left ankle lateral malleolus, possible deltoid ligament repair;  Surgeon: Armor Norleen, MD;  Location: [facility] OR;  Service: Orthopedics;  Laterality: Left;    OSTEOTOMY Right 11/23/2014   mid foot   PARTIAL THYMECTOMY  1986   ? side   TONSILLECTOMY  1954   Patient Active Problem List   Diagnosis Date Noted   Closed low lateral malleolus fracture 05/16/2021   Gout 05/16/2021   Hyperlipemia 05/16/2021   Hypertension 05/16/2021   Type II diabetes mellitus (HCC) 05/16/2021   Spinal stenosis of lumbar region 03/10/2019   Cervical myelopathy (HCC) 03/10/2019   Peripheral neuropathy 02/02/2019   Gait abnormality 02/02/2019   Urinary urgency 02/02/2019   Chronic bilateral low back pain without sciatica 02/02/2019   DOE (dyspnea on exertion) 10/05/2017   Bruit of right carotid artery 10/05/2017   Coronary artery calcification seen on CT scan 10/05/2017   Chest pain 10/04/2017   Aortic atherosclerosis (HCC) 10/04/2017   Charcot foot due to diabetes mellitus (HCC) 11/23/2014    PCP: Katina Pfeiffer, PA  REFERRING PROVIDER: Katina, PA  REFERRING DIAG: poor balance, weakness, difficulty walking  THERAPY DIAG:  No diagnosis found.  Rationale for Evaluation and Treatment: Rehabilitation  ONSET DATE: 09/22/23  SUBJECTIVE:   SUBJECTIVE STATEMENT: Patient reports that she is not doing well today, reports that she was brushing teeth and felt like her legs went out, reports has to hold onto sink and feels like the  legs just won't work the mms are gone.  Reports that she feels like this has happened about 5 x in the past month   Had a fall in November 2022 and sustained a left Malleolar fracture with ORIF.  She was suffering from neuropathy, had just started to use a walker but prior to that did not use a device.  We saw her April 2024 and she progressed well with walking, strength and function.  She returns today due to weakness, difficulty walking, decreased endurance, and unsteady gait.  Denies any recent falls.  She reports that she is walking much slower and is a little more unsteady, feels weak, reports that she is taking Lyrica  and  Cymbalta.  PERTINENT HISTORY: See above PAIN:  Are you having pain? Yes: NPRS scale: 3/10 Pain location: low back Pain description: tight, stabbing Aggravating factors: being on feet longer, twisting pain pain up to 6/10 Relieving factors: rest pain will go to 0/10  PRECAUTIONS: Fall  WEIGHT BEARING RESTRICTIONS: No  FALLS:  Has patient fallen in last 6 months? No, just unsteady  LIVING ENVIRONMENT: Lives with: lives alone Lives in: House/apartment Stairs: Yes: Internal: 12 steps; can reach both Has following equipment at home: Vannie - 2 wheeled, Tour manager, and Grab bars  OCCUPATION: retired  PLOF: Independent with household mobility with device and Needs assistance with homemaking has two daughters that check on her every other day, she does some cooking, does some housework  PATIENT GOALS: may go to the beach in the fall, stay living at home independently  NEXT MD VISIT: none scheduled  OBJECTIVE:   DIAGNOSTIC FINDINGS: none  COGNITION: Overall cognitive status: Within functional limits for tasks assessed     SENSATION: Mid shin down very limited sensation light touch can feel some deeper touch  EDEMA:  Very mild at the ankles  MUSCLE LENGTH: Mild tightness in the HS  POSTURE: rounded shoulders and forward head  PALPATION: Tight and tender in the lumbar paraspinals  LOWER EXTREMITY ROM:  Active ROM Right eval Left eval  Hip flexion 80 80  Hip extension    Hip abduction    Hip adduction    Hip internal rotation    Hip external rotation    Knee flexion    Knee extension 0 0  Ankle dorsiflexion 0 0  Ankle plantarflexion    Ankle inversion    Ankle eversion     (Blank rows = not tested)  LOWER EXTREMITY MMT:  MMT Right eval Left eval RT /left  01/14/24  Hip flexion 4 4- 4+/4+  Hip extension 4- 4- 4/4  Hip abduction 4- 4- 4/4  Hip adduction     Hip internal rotation     Hip external rotation     Knee flexion 4- 4- WFLS BIL  Knee  extension 4 34 WFLs BIL  Ankle dorsiflexion 1+ 0   Ankle plantarflexion 1 1   Ankle inversion 1 0   Ankle eversion 0 0    (Blank rows = not tested) FUNCTIONAL TESTS:  TUG: 37 seconds with FWW at evaluation walk test with CGA and FWW = 158 feet at evaluation, had to stop due to fatigue Unable to stand without holding onto something  GAIT: Distance walked: 158 feet Assistive device utilized: Walker - 2 wheeled Level of assistance: SBA to start and then needs CGA Comments: foot drop bilaterally, decreased foot control, some hip drop on the left side, when fatigued poor safety with transfers   TODAY'S  TREATMENT:                                                                                                                              DATE:   02/11/24 UBE L2 x2 mins each way Lat pull down 20# 2x10  blackTB flexion 2x10 Modified sit up with chest press with yellow ball 2x10 Leg ext 15# 3x10 HS curls 25# 3x10  02/08/24 Nustep level 5 x 8 minutes Sheet traction  STM to the lumbar area and into the buttocks Seated 3# marches 3# LAQ Ball b/n knees squeeze Red tband clamshells Red tband HS curls  02/04/24 NuStep L5x10mins  Box taps forward and lateral 3#  On airex marching 3#  Leg ext 10# 3x10 HS curls 25# 3x10 2# punching  Standing hitting ball Ball bouncing sitting   02/01/24 NuStep L5 x50mins  Supine stretches for LE AB isometric 3x10 Feet on pball rotations and knees to chest  Ball squeeze 2x10  Modified sit ups with ball toss  Leg ext 10# 3x10  HS curls 25# 3x10  Lat pull downs 20# 2x10  01/28/24 NuStep L5 x14mins  Lat pull down 20# 2x10 Black TB flexion 2x10 Leg ext 10# 2x10  HS curls 25# 2x10  On airex box taps with RW- SBA  Chest press 3# WaTE 2x10    01/25/24 Recheck goals  NuStep L5x65mins  Leg ext 10# 3x10 HS curls 25# 3x10 STS x5- very hard for her  Box taps forward and sideways 5# ankle weight Chest press with yellow ball  2x10  01/21/24 Nustep level 5 x 10 minutes Leg curls 25# 3x10 Leg ext 10# 3x10 Lat pulls 20# Partial sit ups Standing 2.5# marching and hip abduction Leg press 20# Green tband clamshells Ball b/n knees squeeze  01/18/24 NuStep L5x14mins Leg ext 10# 2x12 HS curls 25# 2x12 Feet on ball K2C, rotation, isometric abs Ball squeeze 2x10 Hip abduction green 2x10 Hip flexion green 2x10   01/14/23 Nustep L 5 8 min Checked goals and  MMT and ROM ( see above chart) TUG with RW  20.21 sec Black tband trunk flexion 20 x Lat pull 25# 2 sets 10 Seated EOM wt ball toss for core Seated EOM 3# bar ball hitting LAQ 3# 2 sets 10 Standing with RW 3# ankle wts marching,sl hip flex,ext and abd 10x each CGA and guarding on knees to prevent buckling. ABD  challenging Ball toss standing min -mod A with multi LOB back onto mat 12 x   01/04/24 Nustep level 5 x 8 minutes Partial sit ups Feet on ball K2C, rotation, bridges, isometric abs Ball b/n knees bridge LAts 25#  Black tband trunk flexion Leg press 20# both, then single legs 2x5 each Leg curls 25#  12/31/23 NuStep L5x25mins  Stepping over obstacles in bars forwards and side steps  Balance on airex in bars 2 finger support  Step ups on airex in bars x5 each side  Lat pull  downs 20# 3x10 Chest press 10# 3x10  12/28/23 Level 5 x 10 minutes Side stepping Marching, hip abduction and extension in Pbars 35# leg curls Leg extension 10# Feet on ball K2C, rotation, bridges, isometric abs Green tband clamshells Ball b/n knees squeeze Green tband supine hip extension   12/24/23 NuStep L5x31mins  Single arm lat pull standing 10#- one hand on walker  Leg ext 10# 2x12 HS curls 25# 2x12 Standing on airex marching holding on the walker Bicep curls 3# 2x10  Alternating punches with 2# 2x10   12/21/23 Nustep level 5 x 6 minutes 10# chest press 20# lats Black tband back flexion 20# leg press Partial sit ups 5# marches 5# hip abduction 5#  single arm row and extension holding walker Green tband clamshells 3# LAQ Seated 3# marches Sidestepping 4# biceps  12/17/23 NuStep L5x32mins  Modified sit ups yellow ball 2x10 Standing marches 5#  Lateral step taps 4 with 5#  Volleyball  Lat pull down 25# 2x10 blackTB flexion 2x10  12/14/23 NuStep L5x86mins Box taps 4 with RW  STS w/RW, 2x10 some use of table against legs  OHP 3# 2x10 RUE, 2x5 LUE  HS curls 25# 2x10 Leg ext 10# 2x10 Lat pull down 20# 2x10    PATIENT EDUCATION:  Education details: POC Person educated: Patient Education method: Explanation Education comprehension: verbalized understanding  HOME EXERCISE PROGRAM: TBD  ASSESSMENT:  CLINICAL IMPRESSION: Patient comes in struggling more to walk, still putting a lot of weight on her arms and through the walker. Reports no changes in her pain but feels the exercises help her in regards to maintenance. Pt will continue to benefit from skilled interventions to address balance and strength as well as gait progression to keep  her as independent as possible as she lives alone.  OBJECTIVE IMPAIRMENTS: Abnormal gait, cardiopulmonary status limiting activity, decreased activity tolerance, decreased balance, decreased coordination, decreased endurance, decreased mobility, difficulty walking, decreased ROM, decreased strength, impaired flexibility, impaired sensation, and pain.   REHAB POTENTIAL: Good  CLINICAL DECISION MAKING: Stable/uncomplicated  EVALUATION COMPLEXITY: Low   GOALS: Goals reviewed with patient? Yes  SHORT TERM GOALS: Target date: 11/11/23 Independent with initial HEP Goal status: progressing 11/16/23, not doing much at home 12/14/23 Progressing/ doing some 01/14/24  LONG TERM GOALS: Target date: 02/19/24  Independent with advanced HEP or gym program Goal status: ongoing 01/04/24  and 01/14/24  2.  Decrease TUG time to 24 seconds Goal status: REVISED 32s 11/19/23, 24s MET 12/14/23 Will decrease TUG  to < 20s 01/21/24  Goal status: Ongoing 22s 01/25/23  3.  Walk 300 feet in 3 minute walk test Goal status: MET 11/23/23  4.  Be able to negotiate stairs and curbs with SBA with one at a time and a handrail Goal status: IN PROGRESS modA still needed 01/04/24  01/14/24, 01/21/24, 2 rails, step to 01/25/24  5.  Report able to warm up a meal without sitting Goal status: MET 12/14/23   6. Patient will be able to lift LLE without help to get in and out of exercise machines and the car   Baseline: has to pick up leg to move  Goal status: ongoing 02/08/24     PLAN:  PT FREQUENCY: 1-2x/week  PT DURATION: 12 weeks  PLANNED INTERVENTIONS: Therapeutic exercises, Therapeutic activity, Neuromuscular re-education, Balance training, Gait training, Patient/Family education, Self Care, Joint mobilization, and Manual therapy  PLAN FOR NEXT SESSION: see how she is doing advance if possible  Almetta Fam, PT 02/11/2024,  6:03 PM

## 2024-02-15 ENCOUNTER — Encounter: Admitting: Physical Therapy

## 2024-02-15 DIAGNOSIS — R5383 Other fatigue: Secondary | ICD-10-CM | POA: Diagnosis not present

## 2024-02-15 DIAGNOSIS — E1143 Type 2 diabetes mellitus with diabetic autonomic (poly)neuropathy: Secondary | ICD-10-CM | POA: Diagnosis not present

## 2024-02-15 DIAGNOSIS — I10 Essential (primary) hypertension: Secondary | ICD-10-CM | POA: Diagnosis not present

## 2024-02-15 DIAGNOSIS — R42 Dizziness and giddiness: Secondary | ICD-10-CM | POA: Diagnosis not present

## 2024-02-16 DIAGNOSIS — E1143 Type 2 diabetes mellitus with diabetic autonomic (poly)neuropathy: Secondary | ICD-10-CM | POA: Diagnosis not present

## 2024-02-16 DIAGNOSIS — I10 Essential (primary) hypertension: Secondary | ICD-10-CM | POA: Diagnosis not present

## 2024-02-16 DIAGNOSIS — N183 Chronic kidney disease, stage 3 unspecified: Secondary | ICD-10-CM | POA: Diagnosis not present

## 2024-02-16 DIAGNOSIS — I2510 Atherosclerotic heart disease of native coronary artery without angina pectoris: Secondary | ICD-10-CM | POA: Diagnosis not present

## 2024-02-18 ENCOUNTER — Ambulatory Visit: Admitting: Physical Therapy

## 2024-02-18 ENCOUNTER — Encounter: Payer: Self-pay | Admitting: Physical Therapy

## 2024-02-18 DIAGNOSIS — R2681 Unsteadiness on feet: Secondary | ICD-10-CM

## 2024-02-18 DIAGNOSIS — M6281 Muscle weakness (generalized): Secondary | ICD-10-CM

## 2024-02-18 DIAGNOSIS — R262 Difficulty in walking, not elsewhere classified: Secondary | ICD-10-CM | POA: Diagnosis not present

## 2024-02-18 NOTE — Therapy (Signed)
 OUTPATIENT PHYSICAL THERAPY LOWER EXTREMITY TREATMENT     Patient Name: Pamela Snyder MRN: [id number] DOB:September 19, 1945, 78 y.o., female Today's Date: 02/18/2024  END OF SESSION:  PT End of Session - 02/18/24 1746     Visit Number 26    Date for PT Re-Evaluation 03/21/24    Authorization Type Medicare    PT Start Time 1745    PT Stop Time 1830    PT Time Calculation (min) 45 min    Activity Tolerance Patient tolerated treatment well    Behavior During Therapy [facility] for tasks assessed/performed                          Past Medical History:  Diagnosis Date   Anxiety    Arthritis    right foot; spine; hands (11/23/2014)   Charcot foot due to diabetes mellitus (HCC)    Depression    GERD (gastroesophageal reflux disease)    Gout    Hyperlipemia    Hypertension    Migraine    hx   Neuropathy    Numbness    Thyroid goiter 1986   Type II diabetes mellitus (HCC)    Past Surgical History:  Procedure Laterality Date   ABDOMINAL HYSTERECTOMY  1999   w/BSO   ACHILLES TENDON LENGTHENING Right 11/23/2014   ACHILLES TENDON LENGTHENING Right 11/23/2014   Procedure: RIGHT ACHILLES PERCUTANEOUS TENDON LENGTHENING;  Surgeon: Norleen Armor, MD;  Location: [facility] OR;  Service: Orthopedics;  Laterality: Right;   APPENDECTOMY  1963   ARTHRODESIS Right 11/23/2014   mid foot   CARDIAC CATHETERIZATION  08/2004   FOOT ARTHRODESIS Right 11/23/2014   Procedure: RIGHT MID FOOT ARTHRODESIS;  Surgeon: Norleen Armor, MD;  Location: [facility] OR;  Service: Orthopedics;  Laterality: Right;   METATARSAL OSTEOTOMY Right 11/23/2014   Procedure: RIGHT MID FOOT OSTEOTOMY;  Surgeon: Norleen Armor, MD;  Location: [facility] OR;  Service: Orthopedics;  Laterality: Right;   ORIF ANKLE FRACTURE Left 05/16/2021   Procedure: OPEN REDUCTION INTERNAL FIXATION (ORIF) Left ankle lateral malleolus, possible deltoid ligament repair;  Surgeon: Armor Norleen, MD;  Location: [facility] OR;  Service: Orthopedics;  Laterality: Left;    OSTEOTOMY Right 11/23/2014   mid foot   PARTIAL THYMECTOMY  1986   ? side   TONSILLECTOMY  1954   Patient Active Problem List   Diagnosis Date Noted   Closed low lateral malleolus fracture 05/16/2021   Gout 05/16/2021   Hyperlipemia 05/16/2021   Hypertension 05/16/2021   Type II diabetes mellitus (HCC) 05/16/2021   Spinal stenosis of lumbar region 03/10/2019   Cervical myelopathy (HCC) 03/10/2019   Peripheral neuropathy 02/02/2019   Gait abnormality 02/02/2019   Urinary urgency 02/02/2019   Chronic bilateral low back pain without sciatica 02/02/2019   DOE (dyspnea on exertion) 10/05/2017   Bruit of right carotid artery 10/05/2017   Coronary artery calcification seen on CT scan 10/05/2017   Chest pain 10/04/2017   Aortic atherosclerosis (HCC) 10/04/2017   Charcot foot due to diabetes mellitus (HCC) 11/23/2014    PCP: Katina Pfeiffer, PA  REFERRING PROVIDER: Katina, PA  REFERRING DIAG: poor balance, weakness, difficulty walking  THERAPY DIAG:  Muscle weakness (generalized)  Unsteady gait  Difficulty in walking, not elsewhere classified  Rationale for Evaluation and Treatment: Rehabilitation  ONSET DATE: 09/22/23  SUBJECTIVE:   SUBJECTIVE STATEMENT: Patient has continued to have some issues with not feeling well, she had a bad day last week and she tested her  BP and it was 90/60, she reports that she had about 3 days of feeling bad with this low blood pressure, reports that she was very dizzy and lightheaded and was afraid to walk, so has not done much over the past two weeks   Had a fall in November 2022 and sustained a left Malleolar fracture with ORIF.  She was suffering from neuropathy, had just started to use a walker but prior to that did not use a device.  We saw her April 2024 and she progressed well with walking, strength and function.  She returns today due to weakness, difficulty walking, decreased endurance, and unsteady gait.  Denies any recent falls.  She  reports that she is walking much slower and is a little more unsteady, feels weak, reports that she is taking Lyrica and Cymbalta.  PERTINENT HISTORY: See above PAIN:  Are you having pain? Yes: NPRS scale: 3/10 Pain location: low back Pain description: tight, stabbing Aggravating factors: being on feet longer, twisting pain pain up to 6/10 Relieving factors: rest pain will go to 0/10  PRECAUTIONS: Fall  WEIGHT BEARING RESTRICTIONS: No  FALLS:  Has patient fallen in last 6 months? No, just unsteady  LIVING ENVIRONMENT: Lives with: lives alone Lives in: House/apartment Stairs: Yes: Internal: 12 steps; can reach both Has following equipment at home: Vannie - 2 wheeled, Tour manager, and Grab bars  OCCUPATION: retired  PLOF: Independent with household mobility with device and Needs assistance with homemaking has two daughters that check on her every other day, she does some cooking, does some housework  PATIENT GOALS: may go to the beach in the fall, stay living at home independently  NEXT MD VISIT: none scheduled  OBJECTIVE:   DIAGNOSTIC FINDINGS: none  COGNITION: Overall cognitive status: Within functional limits for tasks assessed     SENSATION: Mid shin down very limited sensation light touch can feel some deeper touch  EDEMA:  Very mild at the ankles  MUSCLE LENGTH: Mild tightness in the HS  POSTURE: rounded shoulders and forward head  PALPATION: Tight and tender in the lumbar paraspinals  LOWER EXTREMITY ROM:  Active ROM Right eval Left eval  Hip flexion 80 80  Hip extension    Hip abduction    Hip adduction    Hip internal rotation    Hip external rotation    Knee flexion    Knee extension 0 0  Ankle dorsiflexion 0 0  Ankle plantarflexion    Ankle inversion    Ankle eversion     (Blank rows = not tested)  LOWER EXTREMITY MMT:  MMT Right eval Left eval RT /left  01/14/24  Hip flexion 4 4- 4+/4+  Hip extension 4- 4- 4/4  Hip abduction 4-  4- 4/4  Hip adduction     Hip internal rotation     Hip external rotation     Knee flexion 4- 4- WFLS BIL  Knee extension 4 34 WFLs BIL  Ankle dorsiflexion 1+ 0   Ankle plantarflexion 1 1   Ankle inversion 1 0   Ankle eversion 0 0    (Blank rows = not tested) FUNCTIONAL TESTS:  TUG: 37 seconds with FWW at evaluation walk test with CGA and FWW = 158 feet at evaluation, had to stop due to fatigue Unable to stand without holding onto something  GAIT: Distance walked: 158 feet Assistive device utilized: Walker - 2 wheeled Level of assistance: SBA to start and then needs CGA Comments: foot drop  bilaterally, decreased foot control, some hip drop on the left side, when fatigued poor safety with transfers   TODAY'S TREATMENT:                                                                                                                              DATE:   02/18/24 Nustep level 5 x 8 minutes TUG 24 seconds 150' 270' LAQ Marches Partial sit ups  02/11/24 UBE L2 x2 mins each way Lat pull down 20# 2x10  blackTB flexion 2x10 Modified sit up with chest press with yellow ball 2x10 Leg ext 15# 3x10 HS curls 25# 3x10  02/08/24 Nustep level 5 x 8 minutes Sheet traction  STM to the lumbar area and into the buttocks Seated 3# marches 3# LAQ Ball b/n knees squeeze Red tband clamshells Red tband HS curls  02/04/24 NuStep L5x69mins  Box taps forward and lateral 3#  On airex marching 3#  Leg ext 10# 3x10 HS curls 25# 3x10 2# punching  Standing hitting ball Ball bouncing sitting   02/01/24 NuStep L5 x57mins  Supine stretches for LE AB isometric 3x10 Feet on pball rotations and knees to chest  Ball squeeze 2x10  Modified sit ups with ball toss  Leg ext 10# 3x10  HS curls 25# 3x10  Lat pull downs 20# 2x10  01/28/24 NuStep L5 x68mins  Lat pull down 20# 2x10 Black TB flexion 2x10 Leg ext 10# 2x10  HS curls 25# 2x10  On airex box taps with RW- SBA  Chest  press 3# WaTE 2x10    01/25/24 Recheck goals  NuStep L5x34mins  Leg ext 10# 3x10 HS curls 25# 3x10 STS x5- very hard for her  Box taps forward and sideways 5# ankle weight Chest press with yellow ball 2x10  01/21/24 Nustep level 5 x 10 minutes Leg curls 25# 3x10 Leg ext 10# 3x10 Lat pulls 20# Partial sit ups Standing 2.5# marching and hip abduction Leg press 20# Green tband clamshells Ball b/n knees squeeze  01/18/24 NuStep L5x49mins Leg ext 10# 2x12 HS curls 25# 2x12 Feet on ball K2C, rotation, isometric abs Ball squeeze 2x10 Hip abduction green 2x10 Hip flexion green 2x10   01/14/23 Nustep L 5 8 min Checked goals and  MMT and ROM ( see above chart) TUG with RW  20.21 sec Black tband trunk flexion 20 x Lat pull 25# 2 sets 10 Seated EOM wt ball toss for core Seated EOM 3# bar ball hitting LAQ 3# 2 sets 10 Standing with RW 3# ankle wts marching,sl hip flex,ext and abd 10x each CGA and guarding on knees to prevent buckling. ABD  challenging Ball toss standing min -mod A with multi LOB back onto mat 12 x   01/04/24 Nustep level 5 x 8 minutes Partial sit ups Feet on ball K2C, rotation, bridges, isometric abs Ball b/n knees bridge LAts 25#  Black tband trunk flexion Leg press 20# both, then single legs 2x5  each Leg curls 25#  12/31/23 NuStep L5x4mins  Stepping over obstacles in bars forwards and side steps  Balance on airex in bars 2 finger support  Step ups on airex in bars x5 each side  Lat pull downs 20# 3x10 Chest press 10# 3x10  12/28/23 Level 5 x 10 minutes Side stepping Marching, hip abduction and extension in Pbars 35# leg curls Leg extension 10# Feet on ball K2C, rotation, bridges, isometric abs Green tband clamshells Ball b/n knees squeeze Green tband supine hip extension   12/24/23 NuStep L5x53mins  Single arm lat pull standing 10#- one hand on walker  Leg ext 10# 2x12 HS curls 25# 2x12 Standing on airex marching holding on the  walker Bicep curls 3# 2x10  Alternating punches with 2# 2x10   12/21/23 Nustep level 5 x 6 minutes 10# chest press 20# lats Black tband back flexion 20# leg press Partial sit ups 5# marches 5# hip abduction 5# single arm row and extension holding walker Green tband clamshells 3# LAQ Seated 3# marches Sidestepping 4# biceps  12/17/23 NuStep L5x28mins  Modified sit ups yellow ball 2x10 Standing marches 5#  Lateral step taps 4 with 5#  Volleyball  Lat pull down 25# 2x10 blackTB flexion 2x10  12/14/23 NuStep L5x61mins Box taps 4 with RW  STS w/RW, 2x10 some use of table against legs  OHP 3# 2x10 RUE, 2x5 LUE  HS curls 25# 2x10 Leg ext 10# 2x10 Lat pull down 20# 2x10    PATIENT EDUCATION:  Education details: POC Person educated: Patient Education method: Explanation Education comprehension: verbalized understanding  HOME EXERCISE PROGRAM: TBD  ASSESSMENT:  CLINICAL IMPRESSION: Patient comes in struggling more to walk, still putting a lot of weight on her arms and through the walker. She has had a regression lately and has had some issues with low BP and energy.  AS noted Above her TUG had improved from eval from 37 seconds to 20 seconds last month to 24 seconds today.  Her 3 MWT went from 62' to 150 ' I also continued on to a 6 MWT and that was 270'.  She will have some transportation issues  in the next month so we will not see as often and will need to monitor how she is doing and if we need to send to MD, I have asked her to see the MD regarding BP  OBJECTIVE IMPAIRMENTS: Abnormal gait, cardiopulmonary status limiting activity, decreased activity tolerance, decreased balance, decreased coordination, decreased endurance, decreased mobility, difficulty walking, decreased ROM, decreased strength, impaired flexibility, impaired sensation, and pain.   REHAB POTENTIAL: Good  CLINICAL DECISION MAKING: Stable/uncomplicated  EVALUATION COMPLEXITY: Low   GOALS: Goals  reviewed with patient? Yes  SHORT TERM GOALS: Target date: 11/11/23 Independent with initial HEP Goal status: progressing 11/16/23, not doing much at home 12/14/23 Progressing/ doing some 01/14/24  LONG TERM GOALS: Target date: 02/19/24  Independent with advanced HEP or gym program Goal status: progressing 02/18/24  2.  Decrease TUG time to 24 seconds Goal status: REVISED 32s 11/19/23, 24s MET 12/14/23 Will decrease TUG to < 20s 01/21/24  Goal status: Ongoing 22s 01/25/23  3.  Walk 300 feet in 3 minute walk test Goal status: MET 11/23/23  4.  Be able to negotiate stairs and curbs with SBA with one at a time and a handrail Goal status: IN PROGRESS modA still needed 01/04/24  01/14/24, 01/21/24, 2 rails, step to 01/25/24  5.  Report able to warm up a meal without sitting  Goal status: MET 12/14/23   6. Patient will be able to lift LLE without help to get in and out of exercise machines and the car   Baseline: has to pick up leg to move  Goal status: ongoing 02/18/24     PLAN:  PT FREQUENCY: 1-2x/week  PT DURATION: 12 weeks  PLANNED INTERVENTIONS: Therapeutic exercises, Therapeutic activity, Neuromuscular re-education, Balance training, Gait training, Patient/Family education, Self Care, Joint mobilization, and Manual therapy  PLAN FOR NEXT SESSION: may be hit or miss in the next period due to transportaiton issues, will d/c  Alroy Portela W, PT 02/18/2024, 6:00 PM

## 2024-03-06 DIAGNOSIS — M810 Age-related osteoporosis without current pathological fracture: Secondary | ICD-10-CM | POA: Diagnosis not present

## 2024-03-06 DIAGNOSIS — I10 Essential (primary) hypertension: Secondary | ICD-10-CM | POA: Diagnosis not present

## 2024-03-06 DIAGNOSIS — N183 Chronic kidney disease, stage 3 unspecified: Secondary | ICD-10-CM | POA: Diagnosis not present

## 2024-03-06 DIAGNOSIS — E1143 Type 2 diabetes mellitus with diabetic autonomic (poly)neuropathy: Secondary | ICD-10-CM | POA: Diagnosis not present

## 2024-03-06 DIAGNOSIS — E7800 Pure hypercholesterolemia, unspecified: Secondary | ICD-10-CM | POA: Diagnosis not present

## 2024-03-06 DIAGNOSIS — I2510 Atherosclerotic heart disease of native coronary artery without angina pectoris: Secondary | ICD-10-CM | POA: Diagnosis not present

## 2024-03-09 ENCOUNTER — Ambulatory Visit: Admitting: Physical Therapy

## 2024-03-14 DIAGNOSIS — E1143 Type 2 diabetes mellitus with diabetic autonomic (poly)neuropathy: Secondary | ICD-10-CM | POA: Diagnosis not present

## 2024-03-14 DIAGNOSIS — N183 Chronic kidney disease, stage 3 unspecified: Secondary | ICD-10-CM | POA: Diagnosis not present

## 2024-03-14 DIAGNOSIS — R42 Dizziness and giddiness: Secondary | ICD-10-CM | POA: Diagnosis not present

## 2024-03-14 DIAGNOSIS — Z6834 Body mass index (BMI) 34.0-34.9, adult: Secondary | ICD-10-CM | POA: Diagnosis not present

## 2024-03-14 DIAGNOSIS — E538 Deficiency of other specified B group vitamins: Secondary | ICD-10-CM | POA: Diagnosis not present

## 2024-03-14 DIAGNOSIS — R2689 Other abnormalities of gait and mobility: Secondary | ICD-10-CM | POA: Diagnosis not present

## 2024-03-14 DIAGNOSIS — M810 Age-related osteoporosis without current pathological fracture: Secondary | ICD-10-CM | POA: Diagnosis not present

## 2024-03-14 DIAGNOSIS — E7800 Pure hypercholesterolemia, unspecified: Secondary | ICD-10-CM | POA: Diagnosis not present

## 2024-03-15 ENCOUNTER — Ambulatory Visit: Admitting: Physical Therapy

## 2024-03-17 ENCOUNTER — Ambulatory Visit: Admitting: Physical Therapy

## 2024-03-17 ENCOUNTER — Other Ambulatory Visit (HOSPITAL_BASED_OUTPATIENT_CLINIC_OR_DEPARTMENT_OTHER): Payer: Self-pay | Admitting: Family Medicine

## 2024-03-17 DIAGNOSIS — N183 Chronic kidney disease, stage 3 unspecified: Secondary | ICD-10-CM | POA: Diagnosis not present

## 2024-03-17 DIAGNOSIS — M810 Age-related osteoporosis without current pathological fracture: Secondary | ICD-10-CM

## 2024-03-17 DIAGNOSIS — I10 Essential (primary) hypertension: Secondary | ICD-10-CM | POA: Diagnosis not present

## 2024-03-17 DIAGNOSIS — I2510 Atherosclerotic heart disease of native coronary artery without angina pectoris: Secondary | ICD-10-CM | POA: Diagnosis not present

## 2024-03-17 DIAGNOSIS — E1143 Type 2 diabetes mellitus with diabetic autonomic (poly)neuropathy: Secondary | ICD-10-CM | POA: Diagnosis not present

## 2024-03-22 ENCOUNTER — Ambulatory Visit: Admitting: Physical Therapy

## 2024-03-24 ENCOUNTER — Ambulatory Visit

## 2024-03-28 DIAGNOSIS — M810 Age-related osteoporosis without current pathological fracture: Secondary | ICD-10-CM | POA: Diagnosis not present

## 2024-03-28 DIAGNOSIS — E538 Deficiency of other specified B group vitamins: Secondary | ICD-10-CM | POA: Diagnosis not present

## 2024-03-29 ENCOUNTER — Ambulatory Visit: Admitting: Physical Therapy

## 2024-03-31 ENCOUNTER — Ambulatory Visit: Admitting: Physical Therapy

## 2024-04-05 DIAGNOSIS — N183 Chronic kidney disease, stage 3 unspecified: Secondary | ICD-10-CM | POA: Diagnosis not present

## 2024-04-05 DIAGNOSIS — M810 Age-related osteoporosis without current pathological fracture: Secondary | ICD-10-CM | POA: Diagnosis not present

## 2024-04-05 DIAGNOSIS — E7800 Pure hypercholesterolemia, unspecified: Secondary | ICD-10-CM | POA: Diagnosis not present

## 2024-04-11 DIAGNOSIS — E538 Deficiency of other specified B group vitamins: Secondary | ICD-10-CM | POA: Diagnosis not present

## 2024-04-16 DIAGNOSIS — N183 Chronic kidney disease, stage 3 unspecified: Secondary | ICD-10-CM | POA: Diagnosis not present

## 2024-04-16 DIAGNOSIS — E1143 Type 2 diabetes mellitus with diabetic autonomic (poly)neuropathy: Secondary | ICD-10-CM | POA: Diagnosis not present

## 2024-04-16 DIAGNOSIS — I10 Essential (primary) hypertension: Secondary | ICD-10-CM | POA: Diagnosis not present

## 2024-04-16 DIAGNOSIS — I2510 Atherosclerotic heart disease of native coronary artery without angina pectoris: Secondary | ICD-10-CM | POA: Diagnosis not present

## 2024-04-18 DIAGNOSIS — R2689 Other abnormalities of gait and mobility: Secondary | ICD-10-CM | POA: Diagnosis not present

## 2024-04-18 DIAGNOSIS — E1142 Type 2 diabetes mellitus with diabetic polyneuropathy: Secondary | ICD-10-CM | POA: Diagnosis not present

## 2024-04-25 DIAGNOSIS — E538 Deficiency of other specified B group vitamins: Secondary | ICD-10-CM | POA: Diagnosis not present

## 2024-05-06 DIAGNOSIS — I10 Essential (primary) hypertension: Secondary | ICD-10-CM | POA: Diagnosis not present

## 2024-05-06 DIAGNOSIS — I2510 Atherosclerotic heart disease of native coronary artery without angina pectoris: Secondary | ICD-10-CM | POA: Diagnosis not present

## 2024-05-06 DIAGNOSIS — N183 Chronic kidney disease, stage 3 unspecified: Secondary | ICD-10-CM | POA: Diagnosis not present

## 2024-05-06 DIAGNOSIS — E1143 Type 2 diabetes mellitus with diabetic autonomic (poly)neuropathy: Secondary | ICD-10-CM | POA: Diagnosis not present

## 2024-05-06 DIAGNOSIS — M810 Age-related osteoporosis without current pathological fracture: Secondary | ICD-10-CM | POA: Diagnosis not present

## 2024-05-06 DIAGNOSIS — E7800 Pure hypercholesterolemia, unspecified: Secondary | ICD-10-CM | POA: Diagnosis not present

## 2024-05-16 DIAGNOSIS — E1143 Type 2 diabetes mellitus with diabetic autonomic (poly)neuropathy: Secondary | ICD-10-CM | POA: Diagnosis not present

## 2024-05-16 DIAGNOSIS — I10 Essential (primary) hypertension: Secondary | ICD-10-CM | POA: Diagnosis not present

## 2024-05-16 DIAGNOSIS — I2510 Atherosclerotic heart disease of native coronary artery without angina pectoris: Secondary | ICD-10-CM | POA: Diagnosis not present

## 2024-05-16 DIAGNOSIS — N183 Chronic kidney disease, stage 3 unspecified: Secondary | ICD-10-CM | POA: Diagnosis not present

## 2024-06-05 DIAGNOSIS — I2510 Atherosclerotic heart disease of native coronary artery without angina pectoris: Secondary | ICD-10-CM | POA: Diagnosis not present

## 2024-06-05 DIAGNOSIS — I10 Essential (primary) hypertension: Secondary | ICD-10-CM | POA: Diagnosis not present

## 2024-06-05 DIAGNOSIS — E7800 Pure hypercholesterolemia, unspecified: Secondary | ICD-10-CM | POA: Diagnosis not present

## 2024-06-05 DIAGNOSIS — E1143 Type 2 diabetes mellitus with diabetic autonomic (poly)neuropathy: Secondary | ICD-10-CM | POA: Diagnosis not present

## 2024-06-05 DIAGNOSIS — M810 Age-related osteoporosis without current pathological fracture: Secondary | ICD-10-CM | POA: Diagnosis not present

## 2024-06-05 DIAGNOSIS — N183 Chronic kidney disease, stage 3 unspecified: Secondary | ICD-10-CM | POA: Diagnosis not present
